# Patient Record
Sex: Female | Born: 1945
Health system: Southern US, Community
[De-identification: ages and names within clinical notes are randomized; demographics above are authoritative.]

## PROBLEM LIST (undated history)

## (undated) DIAGNOSIS — C50919 Malignant neoplasm of unspecified site of unspecified female breast: Secondary | ICD-10-CM

## (undated) DIAGNOSIS — I442 Atrioventricular block, complete: Secondary | ICD-10-CM

## (undated) DIAGNOSIS — Z45018 Encounter for adjustment and management of other part of cardiac pacemaker: Secondary | ICD-10-CM

## (undated) DIAGNOSIS — R51 Headache: Secondary | ICD-10-CM

## (undated) DIAGNOSIS — M1990 Unspecified osteoarthritis, unspecified site: Secondary | ICD-10-CM

## (undated) DIAGNOSIS — R519 Headache, unspecified: Secondary | ICD-10-CM

## (undated) DIAGNOSIS — C519 Malignant neoplasm of vulva, unspecified: Secondary | ICD-10-CM

## (undated) DIAGNOSIS — E559 Vitamin D deficiency, unspecified: Secondary | ICD-10-CM

## (undated) DIAGNOSIS — R001 Bradycardia, unspecified: Secondary | ICD-10-CM

## (undated) DIAGNOSIS — E7800 Pure hypercholesterolemia, unspecified: Secondary | ICD-10-CM

## (undated) DIAGNOSIS — Z8042 Family history of malignant neoplasm of prostate: Secondary | ICD-10-CM

## (undated) DIAGNOSIS — C801 Malignant (primary) neoplasm, unspecified: Secondary | ICD-10-CM

## (undated) DIAGNOSIS — H3530 Unspecified macular degeneration: Secondary | ICD-10-CM

## (undated) DIAGNOSIS — R7611 Nonspecific reaction to tuberculin skin test without active tuberculosis: Secondary | ICD-10-CM

## (undated) HISTORY — DX: Unspecified macular degeneration: H35.30

## (undated) HISTORY — DX: Unspecified osteoarthritis, unspecified site: M19.90

## (undated) HISTORY — PX: ABDOMINAL HYSTERECTOMY: SHX81

## (undated) HISTORY — DX: Malignant neoplasm of unspecified site of unspecified female breast: C50.919

## (undated) HISTORY — DX: Vitamin D deficiency, unspecified: E55.9

## (undated) HISTORY — DX: Nonspecific reaction to tuberculin skin test without active tuberculosis: R76.11

## (undated) HISTORY — PX: HEMORROIDECTOMY: SUR656

## (undated) HISTORY — PX: KNEE SURGERY: SHX244

## (undated) HISTORY — DX: Pure hypercholesterolemia, unspecified: E78.00

## (undated) HISTORY — PX: EYE SURGERY: SHX253

## (undated) HISTORY — DX: Family history of malignant neoplasm of prostate: Z80.42

## (undated) HISTORY — DX: Atrioventricular block, complete: I44.2

---

## 1998-01-05 ENCOUNTER — Ambulatory Visit (HOSPITAL_BASED_OUTPATIENT_CLINIC_OR_DEPARTMENT_OTHER): Admission: RE | Admit: 1998-01-05 | Discharge: 1998-01-05 | Payer: Self-pay | Admitting: Orthopedic Surgery

## 1998-09-20 ENCOUNTER — Ambulatory Visit (HOSPITAL_COMMUNITY): Admission: RE | Admit: 1998-09-20 | Discharge: 1998-09-20 | Payer: Self-pay | Admitting: Internal Medicine

## 1998-12-14 ENCOUNTER — Ambulatory Visit (HOSPITAL_BASED_OUTPATIENT_CLINIC_OR_DEPARTMENT_OTHER): Admission: RE | Admit: 1998-12-14 | Discharge: 1998-12-14 | Payer: Self-pay | Admitting: Orthopedic Surgery

## 1999-05-22 ENCOUNTER — Other Ambulatory Visit: Admission: RE | Admit: 1999-05-22 | Discharge: 1999-05-22 | Payer: Self-pay | Admitting: *Deleted

## 2000-06-12 ENCOUNTER — Other Ambulatory Visit: Admission: RE | Admit: 2000-06-12 | Discharge: 2000-06-12 | Payer: Self-pay | Admitting: *Deleted

## 2001-09-14 ENCOUNTER — Other Ambulatory Visit: Admission: RE | Admit: 2001-09-14 | Discharge: 2001-09-14 | Payer: Self-pay | Admitting: *Deleted

## 2002-03-12 ENCOUNTER — Ambulatory Visit (HOSPITAL_COMMUNITY): Admission: RE | Admit: 2002-03-12 | Discharge: 2002-03-12 | Payer: Self-pay | Admitting: Gastroenterology

## 2002-08-03 ENCOUNTER — Other Ambulatory Visit: Admission: RE | Admit: 2002-08-03 | Discharge: 2002-08-03 | Payer: Self-pay | Admitting: *Deleted

## 2003-03-02 ENCOUNTER — Ambulatory Visit (HOSPITAL_BASED_OUTPATIENT_CLINIC_OR_DEPARTMENT_OTHER): Admission: RE | Admit: 2003-03-02 | Discharge: 2003-03-02 | Payer: Self-pay | Admitting: Orthopedic Surgery

## 2003-08-25 ENCOUNTER — Other Ambulatory Visit: Admission: RE | Admit: 2003-08-25 | Discharge: 2003-08-25 | Payer: Self-pay | Admitting: *Deleted

## 2004-09-20 ENCOUNTER — Other Ambulatory Visit: Admission: RE | Admit: 2004-09-20 | Discharge: 2004-09-20 | Payer: Self-pay | Admitting: *Deleted

## 2004-09-21 ENCOUNTER — Encounter: Admission: RE | Admit: 2004-09-21 | Discharge: 2004-09-21 | Payer: Self-pay | Admitting: Surgery

## 2004-09-23 HISTORY — PX: BREAST SURGERY: SHX581

## 2004-10-09 ENCOUNTER — Encounter: Admission: RE | Admit: 2004-10-09 | Discharge: 2004-10-09 | Payer: Self-pay | Admitting: Surgery

## 2004-10-10 ENCOUNTER — Ambulatory Visit (HOSPITAL_BASED_OUTPATIENT_CLINIC_OR_DEPARTMENT_OTHER): Admission: RE | Admit: 2004-10-10 | Discharge: 2004-10-10 | Payer: Self-pay | Admitting: Surgery

## 2004-10-10 ENCOUNTER — Ambulatory Visit (HOSPITAL_COMMUNITY): Admission: RE | Admit: 2004-10-10 | Discharge: 2004-10-10 | Payer: Self-pay | Admitting: Surgery

## 2004-10-10 ENCOUNTER — Encounter (INDEPENDENT_AMBULATORY_CARE_PROVIDER_SITE_OTHER): Payer: Self-pay | Admitting: *Deleted

## 2004-10-15 ENCOUNTER — Ambulatory Visit: Payer: Self-pay | Admitting: Oncology

## 2004-10-23 ENCOUNTER — Ambulatory Visit: Admission: RE | Admit: 2004-10-23 | Discharge: 2004-11-27 | Payer: Self-pay | Admitting: Radiation Oncology

## 2004-10-25 ENCOUNTER — Encounter (INDEPENDENT_AMBULATORY_CARE_PROVIDER_SITE_OTHER): Payer: Self-pay | Admitting: Specialist

## 2004-10-25 ENCOUNTER — Ambulatory Visit (HOSPITAL_COMMUNITY): Admission: RE | Admit: 2004-10-25 | Discharge: 2004-10-25 | Payer: Self-pay | Admitting: Surgery

## 2004-10-25 ENCOUNTER — Ambulatory Visit (HOSPITAL_BASED_OUTPATIENT_CLINIC_OR_DEPARTMENT_OTHER): Admission: RE | Admit: 2004-10-25 | Discharge: 2004-10-25 | Payer: Self-pay | Admitting: Surgery

## 2004-10-26 ENCOUNTER — Inpatient Hospital Stay (HOSPITAL_COMMUNITY): Admission: AD | Admit: 2004-10-26 | Discharge: 2004-10-27 | Payer: Self-pay | Admitting: Surgery

## 2004-11-01 ENCOUNTER — Ambulatory Visit: Payer: Self-pay

## 2004-11-02 ENCOUNTER — Ambulatory Visit (HOSPITAL_COMMUNITY): Admission: RE | Admit: 2004-11-02 | Discharge: 2004-11-02 | Payer: Self-pay | Admitting: Oncology

## 2004-11-30 ENCOUNTER — Inpatient Hospital Stay (HOSPITAL_COMMUNITY): Admission: EM | Admit: 2004-11-30 | Discharge: 2004-12-03 | Payer: Self-pay | Admitting: Oncology

## 2004-11-30 ENCOUNTER — Ambulatory Visit: Payer: Self-pay | Admitting: Oncology

## 2005-01-24 ENCOUNTER — Ambulatory Visit: Payer: Self-pay | Admitting: Oncology

## 2005-02-08 ENCOUNTER — Ambulatory Visit (HOSPITAL_COMMUNITY): Admission: RE | Admit: 2005-02-08 | Discharge: 2005-02-08 | Payer: Self-pay | Admitting: Oncology

## 2005-02-22 ENCOUNTER — Inpatient Hospital Stay (HOSPITAL_COMMUNITY): Admission: EM | Admit: 2005-02-22 | Discharge: 2005-02-25 | Payer: Self-pay | Admitting: Emergency Medicine

## 2005-02-23 ENCOUNTER — Ambulatory Visit: Payer: Self-pay | Admitting: Oncology

## 2005-03-13 ENCOUNTER — Ambulatory Visit: Admission: RE | Admit: 2005-03-13 | Discharge: 2005-06-11 | Payer: Self-pay | Admitting: Radiation Oncology

## 2005-03-13 ENCOUNTER — Encounter: Admission: RE | Admit: 2005-03-13 | Discharge: 2005-04-12 | Payer: Self-pay | Admitting: Oncology

## 2005-04-01 ENCOUNTER — Ambulatory Visit: Payer: Self-pay | Admitting: Dentistry

## 2005-04-01 ENCOUNTER — Encounter: Admission: RE | Admit: 2005-04-01 | Discharge: 2005-04-01 | Payer: Self-pay | Admitting: Dentistry

## 2005-04-04 ENCOUNTER — Ambulatory Visit: Payer: Self-pay | Admitting: Oncology

## 2005-05-01 ENCOUNTER — Ambulatory Visit: Payer: Self-pay

## 2005-05-22 ENCOUNTER — Ambulatory Visit: Payer: Self-pay | Admitting: Oncology

## 2005-07-29 ENCOUNTER — Ambulatory Visit: Payer: Self-pay | Admitting: Oncology

## 2005-08-02 ENCOUNTER — Ambulatory Visit (HOSPITAL_BASED_OUTPATIENT_CLINIC_OR_DEPARTMENT_OTHER): Admission: RE | Admit: 2005-08-02 | Discharge: 2005-08-02 | Payer: Self-pay | Admitting: Surgery

## 2005-08-20 ENCOUNTER — Encounter: Admission: RE | Admit: 2005-08-20 | Discharge: 2005-09-13 | Payer: Self-pay | Admitting: Oncology

## 2005-11-06 ENCOUNTER — Ambulatory Visit: Payer: Self-pay | Admitting: Oncology

## 2006-01-02 ENCOUNTER — Encounter: Admission: RE | Admit: 2006-01-02 | Discharge: 2006-01-02 | Payer: Self-pay | Admitting: Radiology

## 2006-02-04 ENCOUNTER — Ambulatory Visit: Payer: Self-pay | Admitting: Oncology

## 2006-02-06 LAB — COMPREHENSIVE METABOLIC PANEL
ALT: 14 U/L (ref 0–40)
AST: 15 U/L (ref 0–37)
Albumin: 4.1 g/dL (ref 3.5–5.2)
Alkaline Phosphatase: 92 U/L (ref 39–117)
BUN: 16 mg/dL (ref 6–23)
CO2: 25 mEq/L (ref 19–32)
Calcium: 9.1 mg/dL (ref 8.4–10.5)
Chloride: 104 mEq/L (ref 96–112)
Creatinine, Ser: 0.6 mg/dL (ref 0.4–1.2)
Glucose, Bld: 131 mg/dL — ABNORMAL HIGH (ref 70–99)
Potassium: 3.9 mEq/L (ref 3.5–5.3)
Sodium: 141 mEq/L (ref 135–145)
Total Bilirubin: 0.3 mg/dL (ref 0.3–1.2)
Total Protein: 7 g/dL (ref 6.0–8.3)

## 2006-02-06 LAB — CBC WITH DIFFERENTIAL/PLATELET
BASO%: 0.4 % (ref 0.0–2.0)
Basophils Absolute: 0 10*3/uL (ref 0.0–0.1)
EOS%: 2 % (ref 0.0–7.0)
Eosinophils Absolute: 0.2 10*3/uL (ref 0.0–0.5)
HCT: 34 % — ABNORMAL LOW (ref 34.8–46.6)
HGB: 11.6 g/dL (ref 11.6–15.9)
LYMPH%: 27.4 % (ref 14.0–48.0)
MCH: 29 pg (ref 26.0–34.0)
MCHC: 34 g/dL (ref 32.0–36.0)
MCV: 85.4 fL (ref 81.0–101.0)
MONO#: 0.8 10*3/uL (ref 0.1–0.9)
MONO%: 8.9 % (ref 0.0–13.0)
NEUT#: 5.3 10*3/uL (ref 1.5–6.5)
NEUT%: 61.3 % (ref 39.6–76.8)
Platelets: 266 10*3/uL (ref 145–400)
RBC: 3.98 10*6/uL (ref 3.70–5.32)
RDW: 14.1 % (ref 11.3–14.5)
WBC: 8.6 10*3/uL (ref 3.9–10.0)
lymph#: 2.4 10*3/uL (ref 0.9–3.3)

## 2006-02-06 LAB — CANCER ANTIGEN 27.29: CA 27.29: 15 U/mL (ref 0–39)

## 2006-02-28 ENCOUNTER — Other Ambulatory Visit: Admission: RE | Admit: 2006-02-28 | Discharge: 2006-02-28 | Payer: Self-pay | Admitting: Surgery

## 2006-03-18 ENCOUNTER — Other Ambulatory Visit: Admission: RE | Admit: 2006-03-18 | Discharge: 2006-03-18 | Payer: Self-pay | Admitting: *Deleted

## 2006-05-20 ENCOUNTER — Ambulatory Visit: Payer: Self-pay | Admitting: Oncology

## 2006-05-22 LAB — COMPREHENSIVE METABOLIC PANEL
ALT: 19 U/L (ref 0–40)
AST: 16 U/L (ref 0–37)
Albumin: 3.8 g/dL (ref 3.5–5.2)
Alkaline Phosphatase: 95 U/L (ref 39–117)
BUN: 15 mg/dL (ref 6–23)
CO2: 26 mEq/L (ref 19–32)
Calcium: 9.1 mg/dL (ref 8.4–10.5)
Chloride: 105 mEq/L (ref 96–112)
Creatinine, Ser: 0.65 mg/dL (ref 0.40–1.20)
Glucose, Bld: 177 mg/dL — ABNORMAL HIGH (ref 70–99)
Potassium: 4.2 mEq/L (ref 3.5–5.3)
Sodium: 140 mEq/L (ref 135–145)
Total Bilirubin: 0.3 mg/dL (ref 0.3–1.2)
Total Protein: 6.7 g/dL (ref 6.0–8.3)

## 2006-05-22 LAB — CBC WITH DIFFERENTIAL/PLATELET
BASO%: 0.9 % (ref 0.0–2.0)
Basophils Absolute: 0.1 10*3/uL (ref 0.0–0.1)
EOS%: 1 % (ref 0.0–7.0)
Eosinophils Absolute: 0.1 10*3/uL (ref 0.0–0.5)
HCT: 34.1 % — ABNORMAL LOW (ref 34.8–46.6)
HGB: 11.4 g/dL — ABNORMAL LOW (ref 11.6–15.9)
LYMPH%: 23.5 % (ref 14.0–48.0)
MCH: 29 pg (ref 26.0–34.0)
MCHC: 33.6 g/dL (ref 32.0–36.0)
MCV: 86.3 fL (ref 81.0–101.0)
MONO#: 0.7 10*3/uL (ref 0.1–0.9)
MONO%: 7.1 % (ref 0.0–13.0)
NEUT#: 6.6 10*3/uL — ABNORMAL HIGH (ref 1.5–6.5)
NEUT%: 67.5 % (ref 39.6–76.8)
Platelets: 277 10*3/uL (ref 145–400)
RBC: 3.95 10*6/uL (ref 3.70–5.32)
RDW: 13.9 % (ref 11.3–14.5)
WBC: 9.8 10*3/uL (ref 3.9–10.0)
lymph#: 2.3 10*3/uL (ref 0.9–3.3)

## 2006-05-22 LAB — CANCER ANTIGEN 27.29: CA 27.29: 10 U/mL (ref 0–39)

## 2006-05-30 ENCOUNTER — Encounter: Admission: RE | Admit: 2006-05-30 | Discharge: 2006-05-30 | Payer: Self-pay | Admitting: Family Medicine

## 2006-09-08 ENCOUNTER — Ambulatory Visit: Payer: Self-pay | Admitting: Oncology

## 2006-09-10 LAB — COMPREHENSIVE METABOLIC PANEL
ALT: 16 U/L (ref 0–35)
AST: 13 U/L (ref 0–37)
Albumin: 4.2 g/dL (ref 3.5–5.2)
Alkaline Phosphatase: 104 U/L (ref 39–117)
BUN: 20 mg/dL (ref 6–23)
CO2: 23 mEq/L (ref 19–32)
Calcium: 9.1 mg/dL (ref 8.4–10.5)
Chloride: 103 mEq/L (ref 96–112)
Creatinine, Ser: 0.69 mg/dL (ref 0.40–1.20)
Glucose, Bld: 131 mg/dL — ABNORMAL HIGH (ref 70–99)
Potassium: 4 mEq/L (ref 3.5–5.3)
Sodium: 139 mEq/L (ref 135–145)
Total Bilirubin: 0.2 mg/dL — ABNORMAL LOW (ref 0.3–1.2)
Total Protein: 7.2 g/dL (ref 6.0–8.3)

## 2006-09-10 LAB — CBC WITH DIFFERENTIAL/PLATELET
BASO%: 0.4 % (ref 0.0–2.0)
Basophils Absolute: 0 10*3/uL (ref 0.0–0.1)
EOS%: 1 % (ref 0.0–7.0)
Eosinophils Absolute: 0.1 10*3/uL (ref 0.0–0.5)
HCT: 37.2 % (ref 34.8–46.6)
HGB: 12.4 g/dL (ref 11.6–15.9)
LYMPH%: 29.3 % (ref 14.0–48.0)
MCH: 28.6 pg (ref 26.0–34.0)
MCHC: 33.4 g/dL (ref 32.0–36.0)
MCV: 85.6 fL (ref 81.0–101.0)
MONO#: 0.8 10*3/uL (ref 0.1–0.9)
MONO%: 7.8 % (ref 0.0–13.0)
NEUT#: 5.9 10*3/uL (ref 1.5–6.5)
NEUT%: 61.5 % (ref 39.6–76.8)
Platelets: 328 10*3/uL (ref 145–400)
RBC: 4.35 10*6/uL (ref 3.70–5.32)
RDW: 13.3 % (ref 11.3–14.5)
WBC: 9.7 10*3/uL (ref 3.9–10.0)
lymph#: 2.8 10*3/uL (ref 0.9–3.3)

## 2006-09-10 LAB — CANCER ANTIGEN 27.29: CA 27.29: 10 U/mL (ref 0–39)

## 2007-01-02 ENCOUNTER — Ambulatory Visit: Payer: Self-pay | Admitting: Oncology

## 2007-01-07 LAB — LACTATE DEHYDROGENASE: LDH: 155 U/L (ref 94–250)

## 2007-01-07 LAB — CBC WITH DIFFERENTIAL/PLATELET
BASO%: 0.3 % (ref 0.0–2.0)
Basophils Absolute: 0 10*3/uL (ref 0.0–0.1)
EOS%: 1.5 % (ref 0.0–7.0)
Eosinophils Absolute: 0.1 10*3/uL (ref 0.0–0.5)
HCT: 32.2 % — ABNORMAL LOW (ref 34.8–46.6)
HGB: 10.9 g/dL — ABNORMAL LOW (ref 11.6–15.9)
LYMPH%: 27.5 % (ref 14.0–48.0)
MCH: 28.7 pg (ref 26.0–34.0)
MCHC: 33.9 g/dL (ref 32.0–36.0)
MCV: 84.6 fL (ref 81.0–101.0)
MONO#: 0.7 10*3/uL (ref 0.1–0.9)
MONO%: 7.9 % (ref 0.0–13.0)
NEUT#: 5.3 10*3/uL (ref 1.5–6.5)
NEUT%: 62.8 % (ref 39.6–76.8)
Platelets: 270 10*3/uL (ref 145–400)
RBC: 3.81 10*6/uL (ref 3.70–5.32)
RDW: 13.4 % (ref 11.3–14.5)
WBC: 8.4 10*3/uL (ref 3.9–10.0)
lymph#: 2.3 10*3/uL (ref 0.9–3.3)

## 2007-01-07 LAB — COMPREHENSIVE METABOLIC PANEL
ALT: 14 U/L (ref 0–35)
AST: 12 U/L (ref 0–37)
Albumin: 3.6 g/dL (ref 3.5–5.2)
Alkaline Phosphatase: 90 U/L (ref 39–117)
BUN: 15 mg/dL (ref 6–23)
CO2: 24 mEq/L (ref 19–32)
Calcium: 8.6 mg/dL (ref 8.4–10.5)
Chloride: 105 mEq/L (ref 96–112)
Creatinine, Ser: 0.53 mg/dL (ref 0.40–1.20)
Glucose, Bld: 104 mg/dL — ABNORMAL HIGH (ref 70–99)
Potassium: 4.2 mEq/L (ref 3.5–5.3)
Sodium: 142 mEq/L (ref 135–145)
Total Bilirubin: 0.2 mg/dL — ABNORMAL LOW (ref 0.3–1.2)
Total Protein: 6 g/dL (ref 6.0–8.3)

## 2007-01-07 LAB — CANCER ANTIGEN 27.29: CA 27.29: 10 U/mL (ref 0–39)

## 2007-05-13 ENCOUNTER — Ambulatory Visit: Payer: Self-pay | Admitting: Oncology

## 2007-05-15 LAB — COMPREHENSIVE METABOLIC PANEL
ALT: 16 U/L (ref 0–35)
AST: 12 U/L (ref 0–37)
Albumin: 3.9 g/dL (ref 3.5–5.2)
Alkaline Phosphatase: 99 U/L (ref 39–117)
BUN: 15 mg/dL (ref 6–23)
CO2: 24 mEq/L (ref 19–32)
Calcium: 9 mg/dL (ref 8.4–10.5)
Chloride: 103 mEq/L (ref 96–112)
Creatinine, Ser: 0.67 mg/dL (ref 0.40–1.20)
Glucose, Bld: 216 mg/dL — ABNORMAL HIGH (ref 70–99)
Potassium: 3.8 mEq/L (ref 3.5–5.3)
Sodium: 140 mEq/L (ref 135–145)
Total Bilirubin: 0.3 mg/dL (ref 0.3–1.2)
Total Protein: 6.4 g/dL (ref 6.0–8.3)

## 2007-05-15 LAB — CBC WITH DIFFERENTIAL/PLATELET
BASO%: 0.2 % (ref 0.0–2.0)
Basophils Absolute: 0 10*3/uL (ref 0.0–0.1)
EOS%: 0.6 % (ref 0.0–7.0)
Eosinophils Absolute: 0.1 10*3/uL (ref 0.0–0.5)
HCT: 34.4 % — ABNORMAL LOW (ref 34.8–46.6)
HGB: 11.9 g/dL (ref 11.6–15.9)
LYMPH%: 24.1 % (ref 14.0–48.0)
MCH: 29.6 pg (ref 26.0–34.0)
MCHC: 34.8 g/dL (ref 32.0–36.0)
MCV: 85.1 fL (ref 81.0–101.0)
MONO#: 0.6 10*3/uL (ref 0.1–0.9)
MONO%: 6.1 % (ref 0.0–13.0)
NEUT#: 6.8 10*3/uL — ABNORMAL HIGH (ref 1.5–6.5)
NEUT%: 69 % (ref 39.6–76.8)
Platelets: 258 10*3/uL (ref 145–400)
RBC: 4.04 10*6/uL (ref 3.70–5.32)
RDW: 14.5 % (ref 11.3–14.5)
WBC: 9.8 10*3/uL (ref 3.9–10.0)
lymph#: 2.4 10*3/uL (ref 0.9–3.3)

## 2007-05-15 LAB — CANCER ANTIGEN 27.29: CA 27.29: 10 U/mL (ref 0–39)

## 2007-05-27 ENCOUNTER — Ambulatory Visit (HOSPITAL_COMMUNITY): Admission: RE | Admit: 2007-05-27 | Discharge: 2007-05-27 | Payer: Self-pay | Admitting: Oncology

## 2007-09-10 ENCOUNTER — Ambulatory Visit: Payer: Self-pay | Admitting: Oncology

## 2007-09-14 LAB — CBC WITH DIFFERENTIAL/PLATELET
BASO%: 0.6 % (ref 0.0–2.0)
Basophils Absolute: 0 10*3/uL (ref 0.0–0.1)
EOS%: 1.9 % (ref 0.0–7.0)
Eosinophils Absolute: 0.2 10*3/uL (ref 0.0–0.5)
HCT: 38 % (ref 34.8–46.6)
HGB: 13 g/dL (ref 11.6–15.9)
LYMPH%: 27.3 % (ref 14.0–48.0)
MCH: 28.7 pg (ref 26.0–34.0)
MCHC: 34.1 g/dL (ref 32.0–36.0)
MCV: 84 fL (ref 81.0–101.0)
MONO#: 0.5 10*3/uL (ref 0.1–0.9)
MONO%: 6.5 % (ref 0.0–13.0)
NEUT#: 5.1 10*3/uL (ref 1.5–6.5)
NEUT%: 63.7 % (ref 39.6–76.8)
Platelets: 281 10*3/uL (ref 145–400)
RBC: 4.53 10*6/uL (ref 3.70–5.32)
RDW: 13.4 % (ref 11.3–14.5)
WBC: 8.1 10*3/uL (ref 3.9–10.0)
lymph#: 2.2 10*3/uL (ref 0.9–3.3)

## 2007-09-14 LAB — COMPREHENSIVE METABOLIC PANEL
ALT: 16 U/L (ref 0–35)
AST: 14 U/L (ref 0–37)
Albumin: 4.2 g/dL (ref 3.5–5.2)
Alkaline Phosphatase: 106 U/L (ref 39–117)
BUN: 10 mg/dL (ref 6–23)
CO2: 24 mEq/L (ref 19–32)
Calcium: 9.4 mg/dL (ref 8.4–10.5)
Chloride: 101 mEq/L (ref 96–112)
Creatinine, Ser: 0.65 mg/dL (ref 0.40–1.20)
Glucose, Bld: 276 mg/dL — ABNORMAL HIGH (ref 70–99)
Potassium: 3.9 mEq/L (ref 3.5–5.3)
Sodium: 138 mEq/L (ref 135–145)
Total Bilirubin: 0.5 mg/dL (ref 0.3–1.2)
Total Protein: 7 g/dL (ref 6.0–8.3)

## 2007-09-14 LAB — LACTATE DEHYDROGENASE: LDH: 171 U/L (ref 94–250)

## 2007-09-14 LAB — CANCER ANTIGEN 27.29: CA 27.29: 11 U/mL (ref 0–39)

## 2007-11-24 ENCOUNTER — Encounter: Admission: RE | Admit: 2007-11-24 | Discharge: 2007-11-24 | Payer: Self-pay | Admitting: Family Medicine

## 2008-01-11 ENCOUNTER — Ambulatory Visit: Payer: Self-pay | Admitting: Oncology

## 2008-01-13 LAB — CBC WITH DIFFERENTIAL/PLATELET
BASO%: 0 % (ref 0.0–2.0)
Basophils Absolute: 0 10*3/uL (ref 0.0–0.1)
EOS%: 1.9 % (ref 0.0–7.0)
Eosinophils Absolute: 0.2 10*3/uL (ref 0.0–0.5)
HCT: 36.1 % (ref 34.8–46.6)
HGB: 12.4 g/dL (ref 11.6–15.9)
LYMPH%: 28.4 % (ref 14.0–48.0)
MCH: 28.8 pg (ref 26.0–34.0)
MCHC: 34.3 g/dL (ref 32.0–36.0)
MCV: 83.9 fL (ref 81.0–101.0)
MONO#: 0.6 10*3/uL (ref 0.1–0.9)
MONO%: 6.2 % (ref 0.0–13.0)
NEUT#: 5.9 10*3/uL (ref 1.5–6.5)
NEUT%: 63.5 % (ref 39.6–76.8)
Platelets: 277 10*3/uL (ref 145–400)
RBC: 4.3 10*6/uL (ref 3.70–5.32)
RDW: 13.9 % (ref 11.3–14.5)
WBC: 9.4 10*3/uL (ref 3.9–10.0)
lymph#: 2.7 10*3/uL (ref 0.9–3.3)

## 2008-01-13 LAB — COMPREHENSIVE METABOLIC PANEL
ALT: 13 U/L (ref 0–35)
AST: 13 U/L (ref 0–37)
Albumin: 4.1 g/dL (ref 3.5–5.2)
Alkaline Phosphatase: 95 U/L (ref 39–117)
BUN: 15 mg/dL (ref 6–23)
CO2: 24 mEq/L (ref 19–32)
Calcium: 9.4 mg/dL (ref 8.4–10.5)
Chloride: 103 mEq/L (ref 96–112)
Creatinine, Ser: 0.7 mg/dL (ref 0.40–1.20)
Glucose, Bld: 192 mg/dL — ABNORMAL HIGH (ref 70–99)
Potassium: 4 mEq/L (ref 3.5–5.3)
Sodium: 139 mEq/L (ref 135–145)
Total Bilirubin: 0.3 mg/dL (ref 0.3–1.2)
Total Protein: 7.2 g/dL (ref 6.0–8.3)

## 2008-01-13 LAB — CANCER ANTIGEN 27.29: CA 27.29: 14 U/mL (ref 0–39)

## 2008-01-13 LAB — LACTATE DEHYDROGENASE: LDH: 153 U/L (ref 94–250)

## 2008-03-18 ENCOUNTER — Other Ambulatory Visit: Admission: RE | Admit: 2008-03-18 | Discharge: 2008-03-18 | Payer: Self-pay | Admitting: Gynecology

## 2008-05-05 ENCOUNTER — Ambulatory Visit: Payer: Self-pay | Admitting: Oncology

## 2008-05-09 LAB — CBC WITH DIFFERENTIAL/PLATELET
BASO%: 0.4 % (ref 0.0–2.0)
Basophils Absolute: 0 10*3/uL (ref 0.0–0.1)
EOS%: 1.4 % (ref 0.0–7.0)
Eosinophils Absolute: 0.1 10*3/uL (ref 0.0–0.5)
HCT: 34.4 % — ABNORMAL LOW (ref 34.8–46.6)
HGB: 11.5 g/dL — ABNORMAL LOW (ref 11.6–15.9)
LYMPH%: 29.5 % (ref 14.0–48.0)
MCH: 28.5 pg (ref 26.0–34.0)
MCHC: 33.5 g/dL (ref 32.0–36.0)
MCV: 85.1 fL (ref 81.0–101.0)
MONO#: 0.6 10*3/uL (ref 0.1–0.9)
MONO%: 6.9 % (ref 0.0–13.0)
NEUT#: 5.8 10*3/uL (ref 1.5–6.5)
NEUT%: 61.8 % (ref 39.6–76.8)
Platelets: 217 10*3/uL (ref 145–400)
RBC: 4.04 10*6/uL (ref 3.70–5.32)
RDW: 13 % (ref 11.3–14.5)
WBC: 9.3 10*3/uL (ref 3.9–10.0)
lymph#: 2.8 10*3/uL (ref 0.9–3.3)

## 2008-05-09 LAB — CANCER ANTIGEN 27.29: CA 27.29: 16 U/mL (ref 0–39)

## 2008-05-09 LAB — COMPREHENSIVE METABOLIC PANEL
ALT: 13 U/L (ref 0–35)
AST: 14 U/L (ref 0–37)
Albumin: 4 g/dL (ref 3.5–5.2)
Alkaline Phosphatase: 75 U/L (ref 39–117)
BUN: 20 mg/dL (ref 6–23)
CO2: 25 mEq/L (ref 19–32)
Calcium: 9.5 mg/dL (ref 8.4–10.5)
Chloride: 104 mEq/L (ref 96–112)
Creatinine, Ser: 0.59 mg/dL (ref 0.40–1.20)
Glucose, Bld: 122 mg/dL — ABNORMAL HIGH (ref 70–99)
Potassium: 3.8 mEq/L (ref 3.5–5.3)
Sodium: 142 mEq/L (ref 135–145)
Total Bilirubin: 0.3 mg/dL (ref 0.3–1.2)
Total Protein: 7 g/dL (ref 6.0–8.3)

## 2008-05-09 LAB — LACTATE DEHYDROGENASE: LDH: 171 U/L (ref 94–250)

## 2008-08-25 ENCOUNTER — Ambulatory Visit: Payer: Self-pay | Admitting: Oncology

## 2008-11-21 ENCOUNTER — Encounter: Payer: Self-pay | Admitting: Internal Medicine

## 2008-11-28 ENCOUNTER — Ambulatory Visit: Payer: Self-pay | Admitting: Internal Medicine

## 2008-11-28 DIAGNOSIS — R059 Cough, unspecified: Secondary | ICD-10-CM | POA: Insufficient documentation

## 2008-11-28 DIAGNOSIS — R05 Cough: Secondary | ICD-10-CM

## 2008-11-28 DIAGNOSIS — E119 Type 2 diabetes mellitus without complications: Secondary | ICD-10-CM | POA: Insufficient documentation

## 2008-11-28 DIAGNOSIS — Z853 Personal history of malignant neoplasm of breast: Secondary | ICD-10-CM | POA: Insufficient documentation

## 2008-12-26 ENCOUNTER — Ambulatory Visit: Payer: Self-pay | Admitting: Oncology

## 2008-12-28 LAB — CBC WITH DIFFERENTIAL/PLATELET
BASO%: 0.4 % (ref 0.0–2.0)
Basophils Absolute: 0 10*3/uL (ref 0.0–0.1)
EOS%: 1.7 % (ref 0.0–7.0)
Eosinophils Absolute: 0.2 10*3/uL (ref 0.0–0.5)
HCT: 34.2 % — ABNORMAL LOW (ref 34.8–46.6)
HGB: 11.3 g/dL — ABNORMAL LOW (ref 11.6–15.9)
LYMPH%: 21.3 % (ref 14.0–49.7)
MCH: 28.8 pg (ref 25.1–34.0)
MCHC: 33 g/dL (ref 31.5–36.0)
MCV: 87 fL (ref 79.5–101.0)
MONO#: 0.6 10*3/uL (ref 0.1–0.9)
MONO%: 5.8 % (ref 0.0–14.0)
NEUT#: 7.1 10*3/uL — ABNORMAL HIGH (ref 1.5–6.5)
NEUT%: 70.8 % (ref 38.4–76.8)
Platelets: 241 10*3/uL (ref 145–400)
RBC: 3.93 10*6/uL (ref 3.70–5.45)
RDW: 13.2 % (ref 11.2–14.5)
WBC: 10 10*3/uL (ref 3.9–10.3)
lymph#: 2.1 10*3/uL (ref 0.9–3.3)

## 2008-12-28 LAB — COMPREHENSIVE METABOLIC PANEL
ALT: 17 U/L (ref 0–35)
AST: 18 U/L (ref 0–37)
Albumin: 3.3 g/dL — ABNORMAL LOW (ref 3.5–5.2)
Alkaline Phosphatase: 74 U/L (ref 39–117)
BUN: 13 mg/dL (ref 6–23)
CO2: 27 mEq/L (ref 19–32)
Calcium: 9.1 mg/dL (ref 8.4–10.5)
Chloride: 107 mEq/L (ref 96–112)
Creatinine, Ser: 0.56 mg/dL (ref 0.40–1.20)
Glucose, Bld: 158 mg/dL — ABNORMAL HIGH (ref 70–99)
Potassium: 3.8 mEq/L (ref 3.5–5.3)
Sodium: 142 mEq/L (ref 135–145)
Total Bilirubin: 0.4 mg/dL (ref 0.3–1.2)
Total Protein: 6.8 g/dL (ref 6.0–8.3)

## 2008-12-28 LAB — CANCER ANTIGEN 27.29: CA 27.29: 12 U/mL (ref 0–39)

## 2008-12-28 LAB — LACTATE DEHYDROGENASE: LDH: 150 U/L (ref 94–250)

## 2009-01-06 ENCOUNTER — Ambulatory Visit (HOSPITAL_COMMUNITY): Admission: RE | Admit: 2009-01-06 | Discharge: 2009-01-06 | Payer: Self-pay | Admitting: Oncology

## 2009-01-23 ENCOUNTER — Ambulatory Visit: Payer: Self-pay | Admitting: Internal Medicine

## 2009-01-25 ENCOUNTER — Ambulatory Visit: Payer: Self-pay | Admitting: Cardiovascular Disease

## 2009-05-02 ENCOUNTER — Ambulatory Visit: Payer: Self-pay | Admitting: Oncology

## 2009-05-08 LAB — COMPREHENSIVE METABOLIC PANEL
ALT: 15 U/L (ref 0–35)
AST: 20 U/L (ref 0–37)
Albumin: 3.6 g/dL (ref 3.5–5.2)
Alkaline Phosphatase: 63 U/L (ref 39–117)
BUN: 13 mg/dL (ref 6–23)
CO2: 24 mEq/L (ref 19–32)
Calcium: 9 mg/dL (ref 8.4–10.5)
Chloride: 106 mEq/L (ref 96–112)
Creatinine, Ser: 0.65 mg/dL (ref 0.40–1.20)
Glucose, Bld: 218 mg/dL — ABNORMAL HIGH (ref 70–99)
Potassium: 3.7 mEq/L (ref 3.5–5.3)
Sodium: 138 mEq/L (ref 135–145)
Total Bilirubin: 0.4 mg/dL (ref 0.3–1.2)
Total Protein: 7.1 g/dL (ref 6.0–8.3)

## 2009-05-08 LAB — CBC WITH DIFFERENTIAL/PLATELET
BASO%: 0.2 % (ref 0.0–2.0)
Basophils Absolute: 0 10*3/uL (ref 0.0–0.1)
EOS%: 2.2 % (ref 0.0–7.0)
Eosinophils Absolute: 0.2 10*3/uL (ref 0.0–0.5)
HCT: 34.4 % — ABNORMAL LOW (ref 34.8–46.6)
HGB: 11.4 g/dL — ABNORMAL LOW (ref 11.6–15.9)
LYMPH%: 28.8 % (ref 14.0–49.7)
MCH: 28.3 pg (ref 25.1–34.0)
MCHC: 33.1 g/dL (ref 31.5–36.0)
MCV: 85.4 fL (ref 79.5–101.0)
MONO#: 0.5 10*3/uL (ref 0.1–0.9)
MONO%: 5.7 % (ref 0.0–14.0)
NEUT#: 5.9 10*3/uL (ref 1.5–6.5)
NEUT%: 63.1 % (ref 38.4–76.8)
Platelets: 231 10*3/uL (ref 145–400)
RBC: 4.03 10*6/uL (ref 3.70–5.45)
RDW: 14.3 % (ref 11.2–14.5)
WBC: 9.4 10*3/uL (ref 3.9–10.3)
lymph#: 2.7 10*3/uL (ref 0.9–3.3)
nRBC: 0 % (ref 0–0)

## 2009-05-08 LAB — LACTATE DEHYDROGENASE: LDH: 149 U/L (ref 94–250)

## 2009-05-09 LAB — CANCER ANTIGEN 27.29: CA 27.29: 11 U/mL (ref 0–39)

## 2009-05-09 LAB — VITAMIN D 25 HYDROXY (VIT D DEFICIENCY, FRACTURES): Vit D, 25-Hydroxy: 34 ng/mL (ref 30–89)

## 2009-10-04 ENCOUNTER — Ambulatory Visit: Payer: Self-pay | Admitting: Oncology

## 2009-10-06 LAB — CBC WITH DIFFERENTIAL/PLATELET
BASO%: 0.2 % (ref 0.0–2.0)
Basophils Absolute: 0 10*3/uL (ref 0.0–0.1)
EOS%: 1.6 % (ref 0.0–7.0)
Eosinophils Absolute: 0.2 10*3/uL (ref 0.0–0.5)
HCT: 39 % (ref 34.8–46.6)
HGB: 12.6 g/dL (ref 11.6–15.9)
LYMPH%: 31.8 % (ref 14.0–49.7)
MCH: 28.3 pg (ref 25.1–34.0)
MCHC: 32.3 g/dL (ref 31.5–36.0)
MCV: 87.4 fL (ref 79.5–101.0)
MONO#: 0.7 10*3/uL (ref 0.1–0.9)
MONO%: 6.3 % (ref 0.0–14.0)
NEUT#: 6.6 10*3/uL — ABNORMAL HIGH (ref 1.5–6.5)
NEUT%: 60.1 % (ref 38.4–76.8)
Platelets: 255 10*3/uL (ref 145–400)
RBC: 4.46 10*6/uL (ref 3.70–5.45)
RDW: 14.1 % (ref 11.2–14.5)
WBC: 10.9 10*3/uL — ABNORMAL HIGH (ref 3.9–10.3)
lymph#: 3.5 10*3/uL — ABNORMAL HIGH (ref 0.9–3.3)

## 2009-10-06 LAB — CANCER ANTIGEN 27.29: CA 27.29: 13 U/mL (ref 0–39)

## 2009-10-06 LAB — COMPREHENSIVE METABOLIC PANEL
ALT: 17 U/L (ref 0–35)
AST: 16 U/L (ref 0–37)
Albumin: 3.7 g/dL (ref 3.5–5.2)
Alkaline Phosphatase: 76 U/L (ref 39–117)
BUN: 15 mg/dL (ref 6–23)
CO2: 29 mEq/L (ref 19–32)
Calcium: 9.5 mg/dL (ref 8.4–10.5)
Chloride: 102 mEq/L (ref 96–112)
Creatinine, Ser: 0.82 mg/dL (ref 0.40–1.20)
Glucose, Bld: 146 mg/dL — ABNORMAL HIGH (ref 70–99)
Potassium: 4 mEq/L (ref 3.5–5.3)
Sodium: 138 mEq/L (ref 135–145)
Total Bilirubin: 0.5 mg/dL (ref 0.3–1.2)
Total Protein: 7.4 g/dL (ref 6.0–8.3)

## 2009-10-06 LAB — LACTATE DEHYDROGENASE: LDH: 137 U/L (ref 94–250)

## 2010-05-10 ENCOUNTER — Encounter: Admission: RE | Admit: 2010-05-10 | Discharge: 2010-08-08 | Payer: Self-pay | Admitting: Endocrinology

## 2010-07-17 ENCOUNTER — Emergency Department (HOSPITAL_COMMUNITY): Admission: EM | Admit: 2010-07-17 | Discharge: 2010-07-17 | Payer: Self-pay | Admitting: Emergency Medicine

## 2010-09-13 ENCOUNTER — Ambulatory Visit: Payer: Self-pay | Admitting: Internal Medicine

## 2010-09-14 ENCOUNTER — Encounter: Payer: Self-pay | Admitting: Internal Medicine

## 2010-09-26 ENCOUNTER — Telehealth: Payer: Self-pay | Admitting: Internal Medicine

## 2010-09-28 ENCOUNTER — Ambulatory Visit
Admission: RE | Admit: 2010-09-28 | Discharge: 2010-09-28 | Payer: Self-pay | Source: Home / Self Care | Attending: Internal Medicine | Admitting: Internal Medicine

## 2010-10-02 ENCOUNTER — Ambulatory Visit: Payer: Self-pay | Admitting: Oncology

## 2010-10-08 LAB — CBC WITH DIFFERENTIAL/PLATELET
BASO%: 0.3 % (ref 0.0–2.0)
Basophils Absolute: 0 10*3/uL (ref 0.0–0.1)
EOS%: 2.2 % (ref 0.0–7.0)
Eosinophils Absolute: 0.2 10*3/uL (ref 0.0–0.5)
HCT: 36.1 % (ref 34.8–46.6)
HGB: 12.1 g/dL (ref 11.6–15.9)
LYMPH%: 21.8 % (ref 14.0–49.7)
MCH: 28.6 pg (ref 25.1–34.0)
MCHC: 33.6 g/dL (ref 31.5–36.0)
MCV: 85.3 fL (ref 79.5–101.0)
MONO#: 0.8 10*3/uL (ref 0.1–0.9)
MONO%: 9.3 % (ref 0.0–14.0)
NEUT#: 5.5 10*3/uL (ref 1.5–6.5)
NEUT%: 66.4 % (ref 38.4–76.8)
Platelets: 249 10*3/uL (ref 145–400)
RBC: 4.24 10*6/uL (ref 3.70–5.45)
RDW: 14.2 % (ref 11.2–14.5)
WBC: 8.3 10*3/uL (ref 3.9–10.3)
lymph#: 1.8 10*3/uL (ref 0.9–3.3)

## 2010-10-09 LAB — COMPREHENSIVE METABOLIC PANEL
ALT: 13 U/L (ref 0–35)
AST: 16 U/L (ref 0–37)
Albumin: 4.2 g/dL (ref 3.5–5.2)
Alkaline Phosphatase: 71 U/L (ref 39–117)
BUN: 15 mg/dL (ref 6–23)
CO2: 25 mEq/L (ref 19–32)
Calcium: 9.5 mg/dL (ref 8.4–10.5)
Chloride: 102 mEq/L (ref 96–112)
Creatinine, Ser: 0.89 mg/dL (ref 0.40–1.20)
Glucose, Bld: 224 mg/dL — ABNORMAL HIGH (ref 70–99)
Potassium: 4 mEq/L (ref 3.5–5.3)
Sodium: 138 mEq/L (ref 135–145)
Total Bilirubin: 0.3 mg/dL (ref 0.3–1.2)
Total Protein: 6.9 g/dL (ref 6.0–8.3)

## 2010-10-09 LAB — CANCER ANTIGEN 27.29: CA 27.29: 14 U/mL (ref 0–39)

## 2010-10-09 LAB — VITAMIN D 25 HYDROXY (VIT D DEFICIENCY, FRACTURES): Vit D, 25-Hydroxy: 21 ng/mL — ABNORMAL LOW (ref 30–89)

## 2010-10-09 LAB — LACTATE DEHYDROGENASE: LDH: 157 U/L (ref 94–250)

## 2010-10-13 ENCOUNTER — Encounter: Payer: Self-pay | Admitting: Surgery

## 2010-10-23 NOTE — Assessment & Plan Note (Signed)
Summary: Pulmonary/ chronic cough   Visit Type:  Initial Consult Referred by:  Dr. Shaune Pollack PCP:  Dr. Shaune Pollack  Chief Complaint:  Cough.  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  November 28, 2008 pulmonary ov for new cough since 2007,  dry in nature,  comes in spells without a pattern except  not typically waking up from sleep, not exacerbating early am assoc with bladder incontinence,  no real pattern in terms of exposure. was better last summer with no rx for weeks at a time and nothing obvious triggering it in her environment.  coughs to hard she looses her voice, breathing but no gagging or vomit.  Pt denies any significant sore throat, nasal congestion or excess secretions, fever, chills, sweats, unintended wt loss, pleuritic or exertional cp, orthopnea pnd or leg swelling.  Pt also denies any obvious fluctuation in symptoms with weather or environmental change or other alleviating or aggravating factors.     Has tessilon but hasn't tried it.    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  The patient denies shortness of breath at rest, productive cough, coughing up blood, chest pain, irregular heartbeats, acid heartburn, indigestion, loss of appetite, weight change, abdominal pain, difficulty swallowing, sore throat, tooth/dental problems, nasal congestion/difficulty breathing through nose, sneezing, itching, ear ache, anxiety, depression, hand/feet swelling, rash, change in color of mucus, and fever.    Vital Signs:  Patient Profile:   65 Years Old Female Height:     60 inches Weight:      252 pounds O2 Sat:      97 % O2 treatment:    Room Air Temp:     98.2 degrees F oral Pulse rate:   78 / minute BP sitting:   112 / 64  (right arm) Cuff size:   large  Vitals Entered By: Vernie Murders (November 28, 2008 2:50 PM)                Physical Exam  wt 252 November 28, 2008  Ambulatory healthy obese bf  in no acute distress. Afeb with normal vital signs HEENT: nl dentition, turbinates, and orophanx. Nl external ear canals without cough reflex Neck without JVD/Nodes/TM Lungs clear to A and P  bilaterally without cough on insp or exp maneuvers RRR no s3 or murmur or increase in P2 Abd soft and benign with nl excursion in the supine position. No bruits or organomegaly Ext warm without calf tenderness, cyanosis clubbing or edema Skin warm and dry without lesions     Pulmonary Function Test Height (in.): 60 Gender: Female  Impression & Recommendations:  Problem # 1:  COUGH (ICD-786.2)  Orders: Consultation Level V (16606)   Medications Added to Medication List This Visit: 1)  Vicodin 5-500 Mg Tabs (Hydrocodone-acetaminophen) .Marland Kitchen.. 1 every 6-8 hours as needed 2)  Claritin 10 Mg Tabs (Loratadine) .Marland Kitchen.. 1 once daily as needed 3)  Duetact 30-2 Mg Tabs (Pioglitazone hcl-glimepiride)  .Marland Kitchen.. 1 once daily 4)  Vitamin D 30160 Unit Caps (Ergocalciferol) .Marland Kitchen.. 1 cap by mouth twice wkly 5)  Fish Oil 1000 Mg Caps (Omega-3 fatty acids) .Marland Kitchen.. 1 once daily 6)  Calcium 500/d 500-200 Mg-unit Tabs (Calcium carbonate-vitamin d) .Marland Kitchen.. 1 once daily  Patient Instructions: 1)  See Reflux instructions 2)  Prevacid 30 mg  Take  one 30-60 min before first meal of the day  3)  GERD (gastroesophageal reflux disease) was discussed. It is a common cause of respiratory symptoms. It commonly presents in the absence of heartburn. GERD can be treated with medication, but also with lifestyle changes including avoidance of late meals, excessive alcohol, smoking cessation, and avoid fatty foods, chocolate, peppermint, colas, red wine, and acidic juices such as orange juice. NO MINT OR MENTHOL PRODUCTS(no cough drops) 4)  USE SUGARLESS CANDY INSTEAD (jolley ranchers)  5)  No oil based vitamins 6)  Please schedule a follow-up appointment in 6 weeks, sooner if needed   Appended Document: Pulmonary/ chronic cough cxr 11/30/07 ok  DDX is cough variant asthma, upper airway cough syndrome (previously termed post nasal drip syndrome) or GERD, although many studies of chronic cough show pts have more than one mechanism playing a role, and reflux secondary to the cough is always a concern and needs to be treated empirically until the cough is gone because of the potential for a cyclical pattern to emerge, regardless of whether it's the primary problem or not. Of the three most common causes of chronic cough, only one can actually cause the other two and perpetuate the cylce of cough inducing airway trauma, inflammation, heightened sensitivity to reflux which is prompted by the cough itself via a cyclical mechanism.  This may partially respond to steroids and look like asthma and post nasal drainage but never erradicated completely unless the cough and the secondary reflux are eliminated, preferably both at the same time.    Therefore rec diet, Prevacid and tessilon for up to a month before ov with cxr if not better  I emphasized to her and her husband that t he standardized cough guidelines recently published in Chest are a14 step process, not a single office visit, to address this problem and the alogrithm is almost  entirely related at each progressive step  to determining response to therapy with minimal addtional testing needed. Therefore if compliance is an issue this empiric standardized approach simply won't work

## 2010-10-23 NOTE — Assessment & Plan Note (Signed)
Summary: Pulmonary/ fu cough eval   Copy to:  Dr. Shaune Pollack Primary Provider/Referring Provider:  Dr. Shaune Pollack  CC:  6 wk followup.  Pt c/o prod cough in the am x 3 wks with bloody sputum.  States that cough is worse in the am- feels like she has something stuck in her throat.  Marland Kitchen  History of Present Illness: 65 yobf never smoker with Positive PPD in her 86's.  November 28, 2008 pulmonary ov for new cough since 2007,  dry in nature,  comes in spells without a pattern except  not typically waking up from sleep, not exacerbating early am assoc with bladder incontinence,  no real pattern in terms of exposure. was better last summer with no rx for weeks at a time and nothing obvious triggering it in her environment.  coughs to hard she looses her voice, breathing but no gagging or vomit.    Has tessilon but hasn't tried it. Imp was upper airway cough syndrome  REC:   Prevacid 30 mg  Take  one 30-60 min before first meal of the day  3)  GERD diet and  No oil based vitamins  Jan 23, 2009 ov some better but now has sensation of something stuck in throat that is typically worse in am and after muliple throat clearing attempts  bringing up small amts of bloody mucus, but only in am's. denies waking up prematurely with it.  Pt denies any significant sore throat, nasal congestion or excess secretions, fever, chills, sweats, unintended wt loss, pleuritic or exertional cp, orthopnea pnd or leg swelling.  no itcing or sneezing.  Current Medications (verified): 1)  Vicodin 5-500 Mg Tabs (Hydrocodone-Acetaminophen) .Marland Kitchen.. 1 Every 6-8 Hours As Needed 2)  Claritin 10 Mg Tabs (Loratadine) .Marland Kitchen.. 1 Once Daily As Needed 3)  Duetact 30-2 Mg Tabs (Pioglitazone Hcl-Glimepiride) .Marland Kitchen.. 1 Once Daily 4)  Vitamin D 45409 Unit Caps (Ergocalciferol) .Marland Kitchen.. 1 Cap By Mouth Twice Wkly 5)  Calcium 500/d 500-200 Mg-Unit Tabs (Calcium Carbonate-Vitamin D) .Marland Kitchen.. 1 Once Daily 6)  Prevacid 30 Mg Cpdr (Lansoprazole) .Marland Kitchen.. 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.3)        - Dx 08/2004, surgery, RT and Chemo  completed in 2006 adjuvant     DIABETES, TYPE 2 (ICD-250.00)    COUGH  onset around 2007         - CT Chest nl 01/06/09        - CT sinus ordered Jan 23, 2009   Family History:    Reviewed history from 11/28/2008 and no changes required:       Prostate CA- Father  Social History:    Reviewed history from 11/28/2008 and no changes required:       Never smoker       Married       2 Children       Retired Runner, broadcasting/film/video  Vital Signs:  Patient profile:   65 year old female Weight:      250.38 pounds BMI:     49.08 Temp:     98.2 degrees F oral Pulse rate:   95 / minute BP sitting:   130 / 64  (left arm) Cuff size:   large  Vitals Entered ByVernie Murders (Jan 23, 2009 4:36 PM)  O2 Sat on room air at rest %:  98  Physical Exam  Additional Exam:  wt 252 November 28, 2008 > 250 Jan 23, 2009  Ambulatory healthy obese bf  in no acute distress. Afeb with normal vital signs HEENT: nl dentition, minimal nonspecific swelling of turbinates, and orophanx. Nl external ear canals without cough reflex Neck without JVD/Nodes/TM Lungs clear to A and P bilaterally without cough on insp or exp maneuvers RRR no s3 or murmur or increase in P2 Abd soft and benign with nl excursion in the supine position. No bruits or organomegaly Ext warm without calf tenderness, cyanosis clubbing or edema    CT of Chest  Procedure date:  01/06/2009  Findings:      wnl  Impression & Recommendations:  Problem # 1:  COUGH (ICD-786.2) Classic upper airway cough syndrome, so named because it's frequently impossible to sort out how much is LPR vs CR/sinusitis with freq throat clearing generating secondary extra esophageal GERD from wide swings in gastric pressure that occur with throat clearing, promoting self  use of mint and menthol lozenges that reduce the lower esophageal sphincter tone and exacerbate the problem further These are the same pts who not infrequently have failed to tolerate ace inhibitors or report having reflux symptoms that don't respond to standard doses of PPI.   For now try adding chlortrimeton at hs to regimen and proceeding with sinus ct.   Emphasized The standardized cough guidelines recently published in Chest are a14 step process, not a single office visit, to address this problem and the alogrithm is almost  entirely related at each progressive step  to determining response to therapy with minimal addtional testing needed. Therefore if compliance is an issue this empiric standardized approach simply won't work   Medications Added to Medication List This Visit: 1)  Prevacid 30 Mg Cpdr (Lansoprazole) .Marland Kitchen.. 1 by mouth 30-60 min before breakfast 2)  Nexium 40 Mg Cpdr (Esomeprazole magnesium) .... By mouth daily. take one half hour before eating.  Other Orders: T-2 View CXR (71020TC) Radiology Referral (Radiology) Est. Patient Level IV (78469)  Patient Instructions: 1)  See Patient Care Coordinator before leaving for sinus ct scan  eval cough 2)  mucinex dm 1-2 every 12 hours as needed for cough. 3)  chlortrimeton 4mg  tablets 1 @ bedtime  4)  continue acid suppression in the form Nexium 40 mg Take  one 30-60 min before first meal of the day  Prescriptions: NEXIUM 40 MG  CPDR (ESOMEPRAZOLE MAGNESIUM) By mouth daily. Take one half hour before eating.  #34 x 3   Entered and Authorized by:   Nyoka Cowden MD   Signed by:   Vernie Murders on 01/23/2009   Method used:   Electronically to        [facility] [address] (retail)       [phone]       [place], [place]  [address]       Ph: [phone]       Fax: [phone]   RxID:   [id number]

## 2010-10-23 NOTE — Letter (Signed)
Summary: Cough/[facility]  Cough/[facility]   Imported By: Lester Lincoln 11/29/2008 11:16:34  _____________________________________________________________________  External Attachment:    Type:   Image     Comment:   External Document

## 2010-10-25 NOTE — Assessment & Plan Note (Signed)
Summary: Pulmonary/ ext ov > not using h1 daytime  > repeat trial   Copy to:  Dr. Shaune Pollack Primary Provider/Referring Provider:  Dr. Shaune Pollack  CC:  Cough- some better.  History of Present Illness: 65  yobf never smoker with Positive PPD in her 20's followed in pulmonary clinic for chronic cough.  November 28, 2008 pulmonary ov for new cough since 2007,  dry in nature,  comes in spells without a pattern except  not typically waking up from sleep, not exacerbating early am assoc with bladder incontinence,  no real pattern in terms of exposure. was better  summer  2009 with no rx for weeks at a time and nothing obvious triggering it in her environment.  coughs to hard she looses her voice and breath but no gagging or vomit. Imp was upper airway cough syndrome  REC:   Prevacid 30 mg  Take  one 30-60 min before first meal of the day  3)  GERD diet and  No oil based vitamins  Jan 23, 2009 ov  cc cough some  better but now has sensation of something stuck in throat that is typically worse in am and after muliple throat clearing attempts  bringing up small amts of bloody mucus, but only in am's. denies waking up prematurely with it.   rec try h2 at hs and check sinus ct > neg, did not return for f/u.  September 13, 2010 ov cc cough intermittent ( mostly present but gone the month of July and started back with the weather changes  this past fall)  very frustrated that cough" never better"  and worse incontinent and now occurring while sleeping and consumed with the notion that her cancer is the cause but note pattern of complete remission from coughing for a month at a time, both times during the summer months.  Has never been allergy tested.    September 28, 2010 ov complete resolution on tramadol  taking duetact 30/2 x 3 years and saw it causes cough. did not follow instructions re use of H1 to suppress urge to clear her throat.  Pt denies any significant sore throat, dysphagia, itching, sneezing,  nasal  congestion or excess secretions,  fever, chills, sweats, unintended wt loss, pleuritic or exertional cp, hempoptysis, change in activity tolerance  orthopnea pnd or leg swelling     Current Medications (verified): 1)  Duetact 30-2 Mg Tabs (Pioglitazone Hcl-Glimepiride) .Marland Kitchen.. 1 Once Daily 2)  Tramadol Hcl 50 Mg  Tabs (Tramadol Hcl) .... One To Two By Mouth Every 4-6 Hours Until No Longer 3)  Chlor-Trimeton 4 Mg Tabs (Chlorpheniramine Maleate) .Marland Kitchen.. 1 At Bedtime As Needed 4)  Famotidine 20 Mg Tabs (Famotidine) .Marland Kitchen.. 1 At Bedtime 5)  Delsym 30 Mg/28ml Lqcr (Dextromethorphan Polistirex) .... 2 Tsp Every 12 Hrs As Needed 6)  Prilosec 20 Mg Cpdr (Omeprazole) .Marland Kitchen.. 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)     - Dx 08/2004, surgery, RT and Chemo  completed in 2006 adjuvant  DIABETES, TYPE 2 (ICD-250.00) COUGH  onset around 2007      - CT Chest nl 01/06/09     - CT sinus  neg  01/25/2009     - Allergy profile  September 13, 2010 >>>  dust mites only  Vital Signs:  Patient profile:   65 year old female Weight:  236.50 pounds O2 Sat:      98 % on Room air Pulse rate:   86 / minute BP sitting:   140 / 76  (right arm) Cuff size:   large  Vitals Entered By: Vernie Murders (September 28, 2010 9:59 AM)  O2 Flow:  Room air  Physical Exam  Additional Exam:  wt 252 November 28, 2008 > 250 Jan 23, 2009 > 239 September 13, 2010 > 236 September 28, 2010  amb bf with occ throat clearing HEENT: nl dentition, nl oropharanx, moderate  nonspecific swelling of turbinates with increased mucoid secritons Nl external ear canals without cough reflex Neck without JVD/Nodes/TM Lungs clear to A and P bilaterally without cough on insp or exp maneuvers RRR no s3 or murmur or increase in P2 Abd soft and benign with nl excursion in the supine position. No bruits or organomegaly Ext warm  without calf tenderness, cyanosis clubbing or edema    Impression & Recommendations:  Problem # 1:  COUGH (ICD-786.2) The most common causes of chronic cough in immunocompetent adults include: upper airway cough syndrome (UACS), previously referred to as postnasal drip syndrome,  caused by variety of rhinosinus conditions; (2) asthma; (3) GERD; (4) chronic bronchitis from cigarette smoking or other inhaled environmental irritants; (5) nonasthmatic eosinophilic bronchitis; and (6) bronchiectasis. These conditions, singly or in combination, have accounted for up to 94% of the causes of chronic cough in prospective studies.   This is most c/w  Classic Upper airway cough syndrome, so named because it's frequently impossible to sort out how much is  CR/sinusitis with freq throat clearing (which can be related to primary GERD)   vs  causing  secondary extra esophageal GERD from wide swings in gastric pressure that occur with throat clearing, promoting self use of mint and menthol lozenges that reduce the lower esophageal sphincter tone and exacerbate the problem further These are the same pts who not infrequently have failed to tolerate ace inhibitors,  dry powder inhalers or biphosphonates or report having reflux symptoms that don't respond to standard doses of PPI   I had an extended discussion with the patient today lasting 15 to 20 minutes of a 25 minute visit on the following issues:  Given the tendency to completely resolve off rx for months then relapse with fall changes she very well may have nothing more than a mild pnds/allergic rhinitis but this doesn't correlate with dust mites, which are perennial, or certainly cancer dx, her concern, since this is progressive.  nevertheless studies with chronic cough have shown that the idea that "something's wrong and you need to figure out what it is" can be consuming and cause depression/anxiety in the chronic cougher.  Therefore CT chest then FOB looking for  eos could be considered should the cough prove refractory.    Medications Added to Medication List This Visit: 1)  Famotidine 20 Mg Tabs (Famotidine) .Marland Kitchen.. 1 at bedtime 2)  Prilosec 20 Mg Cpdr (Omeprazole) .Marland Kitchen.. 1 30 min before fiirst meal of the day 3)  Chlor-trimeton 4 Mg Tabs (Chlorpheniramine maleate) .Marland Kitchen.. 1 at bedtime as needed 4)  Delsym 30 Mg/52ml Lqcr (Dextromethorphan polistirex) .... 2 tsp every 12 hrs as needed  Other Orders: Est. Patient Level IV (16109)  Patient Instructions: 1)  Prilosec before bfast and pepcid 20 mg at bedtime  2)  Chlortrimeton 4 mg one at bedtime automatically and as needed every 6 hours if sense throat drainage 3)  Take delsym two  tsp every 12 hours and add tramadol 50 mg up to every 4 hours to suppress the urge to cough. Swallowing water or using ice chips/non mint and menthol containing candies (such as lifesavers or sugarless jolly ranchers) are also effective.  4)  GERD (REFLUX) diet 5)  If not satisfied the next step may be to repeat a CT scan of your chest - call after 2 week if not satisfied.

## 2010-10-25 NOTE — Miscellaneous (Signed)
Summary: Allergens Test/[facility]  Allergens Test/[facility]   Imported By: Lanelle Bal 10/02/2010 11:05:46  _____________________________________________________________________  External Attachment:    Type:   Image     Comment:   External Document

## 2010-10-25 NOTE — Progress Notes (Signed)
Summary: allergy results given  Phone Note Outgoing Call   Call placed by: Vernie Murders,  September 26, 2010 9:33 AM Call placed to: Patient Summary of Call: Called pt to inform that per MW- her labs showed that she is only allergic to dust mites- everything else was normal.  Pt verbalized understanding and states will keep planned rov to discuss this further.  Initial call taken by: Vernie Murders,  September 26, 2010 9:34 AM

## 2010-10-25 NOTE — Assessment & Plan Note (Signed)
Summary: Pulmonary/ ext ov for recurrent cough   Copy to:  Dr. Shaune Pollack Primary Provider/Referring Provider:  Dr. Shaune Pollack  CC:  Cough- worse.  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  November 28, 2008 pulmonary ov for new cough since 2007,  dry in nature,  comes in spells without a pattern except  not typically waking up from sleep, not exacerbating early am assoc with bladder incontinence,  no real pattern in terms of exposure. was better last summer with no rx for weeks at a time and nothing obvious triggering it in her environment.  coughs to hard she looses her voice, breathing but no gagging or vomit. Imp was upper airway cough syndrome  REC:   Prevacid 30 mg  Take  one 30-60 min before first meal of the day  3)  GERD diet and  No oil based vitamins  Jan 23, 2009 ov some better but now has sensation of something stuck in throat that is typically worse in am and after muliple throat clearing attempts  bringing up small amts of bloody mucus, but only in am's. denies waking up prematurely with it.   rec try h2 at hs and check sinus ct > neg, did not return for f/u.  September 13, 2010 ov cc cough intermittent ( mostly present gone the month of July and started bad with the weather changed this past fall)  very frustrated that cough never better and worse incontinent and now occurring while sleeping and consumed with the notion that her cancer is the cause but note pattern of complete remission from coughing for a month at a time, both times during the summer months.  Has never been allergy tested.  no purulent or excessive mucus production. Pt denies any significant sore throat, dysphagia, itching, sneezing,  nasal congestion or excess secretions,  fever, chills, sweats, unintended wt loss, pleuritic or exertional cp, hempoptysis, change in activity tolerance  orthopnea pnd or leg swelling Pt also denies any obvious fluctuation in symptoms with weather  or environmental change or other alleviating or aggravating factors.       Current Medications (verified): 1)  Duetact 30-2 Mg Tabs (Pioglitazone Hcl-Glimepiride) .Marland Kitchen.. 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)     - Dx 08/2004, surgery, RT and Chemo  completed in 2006 adjuvant  DIABETES, TYPE 2 (ICD-250.00) COUGH  onset around 2007      - CT Chest nl 01/06/09     - CT sinus  neg      - Allergy profeile September 13, 2010 >>>  Vital Signs:  Patient profile:   65 year old female Weight:      239 pounds BMI:     46.85 O2 Sat:      97 % on Room air Temp:     97.8 degrees F oral Pulse rate:   99 / minute BP sitting:   120 / 60  (left arm) Cuff size:   large  Vitals Entered By: Vernie Murders (September 13, 2010 10:18 AM)  O2 Flow:  Room air  Physical Exam  Additional Exam:  wt 252 November 28, 2008 > 250 Jan 23, 2009 > 239 September 13, 2010  HEENT: nl dentition, nl oropharanx, moderate  nonspecific swelling of turbinates with increased mucoid secritons Nl external ear canals without cough reflex Neck without  JVD/Nodes/TM Lungs clear to A and P bilaterally without cough on insp or exp maneuvers RRR no s3 or murmur or increase in P2 Abd soft and benign with nl excursion in the supine position. No bruits or organomegaly Ext warm without calf tenderness, cyanosis clubbing or edema    CXR  Procedure date:  07/17/2010  Findings:         Comparison: 11/24/2007, chest CT 01/05/2009    Findings: Left axillary clips are noted.  The heart size is normal.   The lungs are clear.  No pleural effusion.  Widening of the right   [facility] joint distance could signify prior distal clavicular resection   or resorption.    IMPRESSION:   No acute cardiopulmonary process.  Impression & Recommendations:  Problem # 1:  COUGH (ICD-786.2) The most common causes of chronic cough in  immunocompetent adults include: upper airway cough syndrome (UACS), previously referred to as postnasal drip syndrome,  caused by variety of rhinosinus conditions; (2) asthma; (3) GERD; (4) chronic bronchitis from cigarette smoking or other inhaled environmental irritants; (5) nonasthmatic eosinophilic bronchitis; and (6) bronchiectasis. These conditions, singly or in combination, have accounted for up to 94% of the causes of chronic cough in prospective studies.   This is most c/w  Classic Upper airway cough syndrome, so named because it's frequently impossible to sort out how much is  CR/sinusitis with freq throat clearing (which can be related to primary GERD)   vs  causing  secondary extra esophageal GERD from wide swings in gastric pressure that occur with throat clearing, promoting self use of mint and menthol lozenges that reduce the lower esophageal sphincter tone and exacerbate the problem further These are the same pts who not infrequently have failed to tolerate ace inhibitors,  dry powder inhalers or biphosphonates or report having reflux symptoms that don't respond to standard doses of PPI   See instructions for specific recommendations  NB  The standardized cough guidelines recently published in Chest are a 14 step process, not a single office visit,  and are intended  to address this problem logically,  with an alogrithm dependent on response to each progressive step  to determine a specific diagnosis with  minimal addtional testing needed. Therefore if compliance is an issue this empiric standardized approach simply won't work.   Medications Added to Medication List This Visit: 1)  Prednisone 10 Mg Tabs (Prednisone) .... 4 each am x 2days, 2x2days, 1x2days and stop 2)  Tramadol Hcl 50 Mg Tabs (Tramadol hcl) .... One to two by mouth every 4-6 hours until no longer  Other Orders: T-Allergy Profile [place], [place], [place], [place], [place] [phone]) Est. Patient Level IV [phone])  Patient  Instructions: 1)  Prednisone 4 each am x 2days, 2x2days, 1x2days and stop  2)  Prilosec before bfast and pepcid 20 mg at bedtime as long as coughing ( reflux is to cough what oxygen is to fire)  3)  Chlortrimeton 4 mg one at bedtime automatically and as needed every 6 hours if sense throat drainage 4)  Take delsym two tsp every 12 hours and add tramadol 50 mg up to every 4 hours to suppress the urge to cough. Swallowing water or using ice chips/non mint and menthol containing candies (such as lifesavers or sugarless jolly ranchers) are also effective.  5)  GERD (REFLUX)  is a common cause of respiratory symptoms. It commonly presents without heartburn and can be treated with medication, but also with lifestyle changes including avoidance  of late meals, excessive alcohol, smoking cessation, and avoid fatty foods, chocolate, peppermint, colas, red wine, and acidic juices such as orange juice. NO MINT OR MENTHOL PRODUCTS SO NO COUGH DROPS  6)  USE SUGARLESS CANDY INSTEAD (jolley ranchers)  7)  NO OIL BASED VITAMINS  8)  Please schedule a follow-up appointment in 2 weeks, sooner if needed with all medications in hand. 9)  discussed also: 10)  Unlike when you get a prescription for eyeglasses, it's not possible to always walk out of this or any medical office with a perfect prescription that is immediately effective  based on any test that we offer here.  On the contrary, it may take several weeks for the full impact of changes recommened today - hopefully you will respond well.  If not, then we'll adjust your medication on your next visit accordingly, knowing more then than we can possibly know now.    Prescriptions: TRAMADOL HCL 50 MG  TABS (TRAMADOL HCL) One to two by mouth every 4-6 hours until no longer  #40 x 0   Entered and Authorized by:   Nyoka Cowden MD   Signed by:   Nyoka Cowden MD on 09/13/2010   Method used:   Electronically to        [facility] [address] (retail)        [phone]       [place], [place]  [address]       Ph: [phone]       Fax: [phone]   RxID:   [id number] PREDNISONE 10 MG  TABS (PREDNISONE) 4 each am x 2days, 2x2days, 1x2days and stop  #14 x 0   Entered and Authorized by:   Nyoka Cowden MD   Signed by:   Nyoka Cowden MD on 09/13/2010   Method used:   Electronically to        [facility] [address] (retail)       [phone]       [place], [place]  [address]       Ph: [phone]       Fax: [phone]   RxID:   [id number]    Immunization History:  Influenza Immunization History:    Influenza:  historical (07/24/2010)

## 2010-11-02 ENCOUNTER — Telehealth (INDEPENDENT_AMBULATORY_CARE_PROVIDER_SITE_OTHER): Payer: Self-pay | Admitting: *Deleted

## 2010-11-08 NOTE — Progress Notes (Signed)
Summary: cough no better > needs ov<<appt scheduled  Phone Note Call from Patient Call back at Home Phone [phone]   Caller: Patient Call For: Wert Summary of Call: Spoke with pt.  She states calling to give update on cough per MW request.  She states that her cough is no better, maybe some worse over the past few days. Now cough is sometimes prod with greenish sputum- sometimes with "dots of blood"  She is still needing to take the tramadol, delsym and chlotrimeton daily to control cough.  Has tried without taking these and she states cough gets much worse.  Last ov note states next step is to order ct chest.  Pls advise thanks! Initial call taken by: Vernie Murders,  November 02, 2010 11:27 AM  Follow-up for Phone Call        no need for ct yet but needs ov with all active  meds in hand to regroup.  remind her that The standardized cough guidelines recently published in Chest are a 14 step process  and are intended  to address this problem logically,  step by step, and we may want to order a ct next ov but not without first regrouping on med rx (prev ct was nl and this is expensive with lots of radiation exposure) Follow-up by: Nyoka Cowden MD,  November 02, 2010 12:56 PM  Additional Follow-up for Phone Call Additional follow up Details #1::        Pt to see MW on 11/15/2010 @ 4pm and bring all meds to appt.  Appt slot doubled because pt needs late appt. Additional Follow-up by: Michel Bickers CMA,  November 02, 2010 1:50 PM

## 2010-11-15 ENCOUNTER — Ambulatory Visit (INDEPENDENT_AMBULATORY_CARE_PROVIDER_SITE_OTHER): Payer: Medicare Other | Admitting: Internal Medicine

## 2010-11-15 ENCOUNTER — Encounter: Payer: Self-pay | Admitting: Internal Medicine

## 2010-11-15 ENCOUNTER — Other Ambulatory Visit: Payer: Self-pay | Admitting: Internal Medicine

## 2010-11-15 DIAGNOSIS — R053 Chronic cough: Secondary | ICD-10-CM

## 2010-11-15 DIAGNOSIS — R05 Cough: Secondary | ICD-10-CM

## 2010-11-15 DIAGNOSIS — R059 Cough, unspecified: Secondary | ICD-10-CM

## 2010-11-20 ENCOUNTER — Encounter: Payer: Self-pay | Admitting: Internal Medicine

## 2010-11-20 NOTE — Assessment & Plan Note (Signed)
Summary: Pulmonayr/ ext f/u eval for cough > ct scans next   Copy to:  Dr. Shaune Pollack Primary Provider/Referring Provider:  Dr. Shaune Pollack  CC:  Cough- worse.  History of Present Illness: 65  yobf never smoker with Positive PPD in her 20's followed in pulmonary clinic for chronic cough.  November 28, 2008 pulmonary ov for new cough since 2007,  dry in nature,  comes in spells without a pattern except  not typically waking up from sleep, not exacerbating early am assoc with bladder incontinence,  no real pattern in terms of exposure. was better  summer  2009 with no rx for weeks at a time and nothing obvious triggering it in her environment.  coughs to hard she looses her voice and breath but no gagging or vomit. Imp was upper airway cough syndrome  REC:   Prevacid 30 mg  Take  one 30-60 min before first meal of the day  3)  GERD diet and  No oil based vitamins  Jan 23, 2009 ov  cc cough some  better but now has sensation of something stuck in throat that is typically worse in am and after muliple throat clearing attempts  bringing up small amts of bloody mucus, but only in am's. denies waking up prematurely with it.   rec try h2 at hs and check sinus ct > neg, did not return for f/u.  September 13, 2010 ov cc cough intermittent ( mostly present but gone the month of July and started back with the weather changes  this past fall)  very frustrated that cough" never better"  and worse incontinent and now occurring while sleeping and consumed with the notion that her cancer is the cause but note pattern of complete remission from coughing for a month at a time, both times during the summer months.  Has never been allergy tested.    September 28, 2010 ov complete resolution on tramadol  taking duetact 30/2 x 3 years and saw it causes cough. did not follow instructions re use of H1 to suppress urge to clear her throat.  rec max gerd rx plus h1 as needed as per guidlines > minimally betterFebruary 23, 2012  ov cough only transiently better, now worse, dry, assoc with urinary incontinence, appears compliant with rx though never really suppressed the cough aggressively to eliminate cyclical coughing  Pt denies any significant sore throat, dysphagia, itching, sneezing,  nasal congestion or excess secretions,  fever, chills, sweats, unintended wt loss, pleuritic or exertional cp, hempoptysis, change in activity tolerance  orthopnea pnd or leg swelling Pt also denies any obvious fluctuation in symptoms with weather or environmental change or other alleviating or aggravating factors.       Current Medications (verified): 1)  Onglyza 5 Mg Tabs (Saxagliptin Hcl) .Marland Kitchen.. 1 Once Daily 2)  Famotidine 20 Mg Tabs (Famotidine) .Marland Kitchen.. 1 At Bedtime 3)  Prilosec 20 Mg Cpdr (Omeprazole) .Marland Kitchen.. 1 30 Min Before Fiirst Meal of The Day 4)  Tramadol Hcl 50 Mg  Tabs (Tramadol Hcl) .... One To Two By Mouth Every 4-6 Hours Until No Longer 5)  Chlor-Trimeton 4 Mg Tabs (Chlorpheniramine Maleate) .Marland Kitchen.. 1 At Bedtime As Needed 6)  Delsym 30 Mg/59ml Lqcr (Dextromethorphan Polistirex) .... 2 Tsp Every 12 Hrs As Needed 7)  Aleve 220 Mg Tabs (Naproxen Sodium) .... 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)     -  Dx 08/2004, surgery, RT and Chemo  completed in 2006 adjuvant  DIABETES, TYPE 2 (ICD-250.00) COUGH  onset around 2007      - CT Chest nl 01/06/09 > repeat ordered November 15, 2010      - CT sinus  neg  01/25/2009 > repeat ordered November 15, 2010      - Allergy profile  September 13, 2010 >>>  dust mites only  Vital Signs:  Patient profile:   65 year old female Weight:      233 pounds O2 Sat:      95 % on Room air Temp:     98.1 degrees F oral Pulse rate:   103 / minute BP sitting:   120 / 78  (left arm) Cuff size:   large  Vitals Entered By: Vernie Murders (November 15, 2010 4:09 PM)  O2 Flow:  Room  air  Physical Exam  Additional Exam:  wt 252 November 28, 2008 > 250 Jan 23, 2009 > 239 September 13, 2010 > 236 September 28, 2010 > 233 November 15, 2010  amb bf with occ throat clearing HEENT: nl dentition, nl oropharanx, moderate  nonspecific swelling of turbinates with increased mucoid secritons Nl external ear canals without cough reflex Neck without JVD/Nodes/TM Lungs clear to A and P bilaterally without cough on insp or exp maneuvers RRR no s3 or murmur or increase in P2 Abd soft and benign with nl excursion in the supine position. No bruits or organomegaly Ext warm without calf tenderness, cyanosis clubbing or edema    Impression & Recommendations:  Problem # 1:  COUGH (ICD-786.2)  The most common causes of chronic cough in immunocompetent adults include: upper airway cough syndrome (UACS), previously referred to as postnasal drip syndrome,  caused by variety of rhinosinus conditions; (2) asthma; (3) GERD; (4) chronic bronchitis from cigarette smoking or other inhaled environmental irritants; (5) nonasthmatic eosinophilic bronchitis; and (6) bronchiectasis. These conditions, singly or in combination, have accounted for up to 94% of the causes of chronic cough in prospective studies.   This is most c/w  Classic Upper airway cough syndrome, so named because it's frequently impossible to sort out how much is  CR/sinusitis with freq throat clearing (which can be related to primary GERD)   vs  causing  secondary extra esophageal GERD from wide swings in gastric pressure that occur with throat clearing, promoting self use of mint and menthol lozenges that reduce the lower esophageal sphincter tone and exacerbate the problem further These are the same pts who not infrequently have failed to tolerate ace inhibitors,  dry powder inhalers or biphosphonates or report having reflux symptoms that don't respond to standard doses of PPI   I had an extended discussion with the patient today lasting 15 to  20 minutes of a 25 minute visit on the following issues:  Given the tendency to completely resolve off rx for months then relapse with fall changes she very well may have nothing more than a mild pnds/allergic rhinitis but this doesn't correlate with dust mites, which are perennial, or certainly cancer dx, her concern, since this is progressive.  nevertheless studies with chronic cough have shown that the idea that "something's wrong and you need to figure out what it is" can be consuming and cause depression/anxiety in the chronic cougher.  Therefore CT chest/sinus then  methacholine challenge then FOB looking for eos could be considered should the cough prove refractory in that order following the most recently  published cough guidelines.       Medications Added to Medication List This Visit: 1)  Onglyza 5 Mg Tabs (Saxagliptin hcl) .Marland Kitchen.. 1 once daily 2)  Tramadol Hcl 50 Mg Tabs (Tramadol hcl) .... One to two by mouth every 4-6 hours until no longer 3)  Aleve 220 Mg Tabs (Naproxen sodium) .... As directed as needed 4)  Prednisone 10 Mg Tabs (Prednisone) .... 4 each am x 2days, 2x2days, 1x2days and stop  Other Orders: Misc. Referral (Misc. Ref) Est. Patient Level IV ([address])  Patient Instructions: 1)  Prednisone 4 each am x 2days, 2x2days, 1x2days and stop  2)  Take delsym two tsp every 12 hours and add tramadol 50 mg up to 2 every 4 hours to suppress the urge to cough. Swallowing water or using ice chips/non mint and menthol containing candies (such as lifesavers or sugarless jolly ranchers) are also effective.  3)  See Patient Care Coordinator before leaving for for CT Chest and Sinus after we get your bloodwork from [facility] (bmet, cbc with diff and IgE level) 4)  . Prescriptions: TRAMADOL HCL 50 MG  TABS (TRAMADOL HCL) One to two by mouth every 4-6 hours until no longer  #60 x 0   Entered and Authorized by:   Nyoka Cowden MD   Signed by:   Nyoka Cowden MD on 11/15/2010   Method used:    Electronically to        [facility] [address] (retail)       [phone]       [place], [place]  [address]       Ph: [phone]       Fax: [phone]   RxID:   [id number] PREDNISONE 10 MG  TABS (PREDNISONE) 4 each am x 2days, 2x2days, 1x2days and stop  #14 x 0   Entered and Authorized by:   Nyoka Cowden MD   Signed by:   Nyoka Cowden MD on 11/15/2010   Method used:   Electronically to        [facility] [address] (retail)       [phone]       [place], [place]  [address]       Ph: [phone]       Fax: [phone]   RxID:   [id number]

## 2010-11-20 NOTE — Miscellaneous (Signed)
Summary: Orders Update  Clinical Lists Changes  Orders: Added new Test order of T-IgE (Immunoglobulin E) ([id number]) - Signed Added new Test order of T-CBC w/Diff [phone]) - Signed Added new Test order of T-Basic Metabolic Panel [phone]) - Signed

## 2010-11-21 ENCOUNTER — Telehealth (INDEPENDENT_AMBULATORY_CARE_PROVIDER_SITE_OTHER): Payer: Self-pay | Admitting: *Deleted

## 2010-11-26 ENCOUNTER — Ambulatory Visit (INDEPENDENT_AMBULATORY_CARE_PROVIDER_SITE_OTHER)
Admission: RE | Admit: 2010-11-26 | Discharge: 2010-11-26 | Disposition: A | Discharge status: Home / Self Care | Payer: Medicare Other | Source: Ambulatory Visit | Attending: Internal Medicine | Admitting: Internal Medicine

## 2010-11-26 DIAGNOSIS — R059 Cough, unspecified: Secondary | ICD-10-CM

## 2010-11-26 DIAGNOSIS — R05 Cough: Secondary | ICD-10-CM

## 2010-11-26 DIAGNOSIS — R053 Chronic cough: Secondary | ICD-10-CM

## 2010-11-26 MED ORDER — IOHEXOL 300 MG/ML  SOLN
100.0000 mL | Freq: Once | INTRAMUSCULAR | Status: AC | PRN
  Administered 2010-11-26: 100 mL via INTRAVENOUS

## 2010-11-28 ENCOUNTER — Telehealth (INDEPENDENT_AMBULATORY_CARE_PROVIDER_SITE_OTHER): Payer: Self-pay | Admitting: *Deleted

## 2010-11-29 NOTE — Progress Notes (Signed)
Summary: pharmacy calling re: rx  Phone Note From Pharmacy   Caller: colin with [facility] Call For: wert  Summary of Call: says he had sent a fax re: clarification for rx tramadol for cough. [phone] Initial call taken by: Tivis Ringer, CNA,  November 21, 2010 11:32 AM  Follow-up for Phone Call        Spoke with Ayesha Rumpf and advised that the tramadol is as needed for cough and he verbalized understanding.  Follow-up by: Vernie Murders,  November 21, 2010 1:45 PM

## 2010-12-04 NOTE — Progress Notes (Signed)
Summary: Needs ov to regroup > ok to   Phone Note Call from Patient Call back at Home Phone [phone] [facility]     Caller: Patient Call For: wert Summary of Call: pt called to make a f/u w/ dr wert (for 2 wks from [place]). she said she could only come in the afternoon (because of work). wants to speak to dr wert's nurse re: recs- as far as CT results go. call home # or cell [phone] Initial call taken by: Tivis Ringer, CNA,  November 28, 2010 8:53 AM  Follow-up for Phone Call        needs ov before March 20 with all meds in hand Follow-up by: Nyoka Cowden MD,  November 28, 2010 11:27 AM  Additional Follow-up for Phone Call Additional follow up Details #1::        LMOMTCB Vernie Murders  November 28, 2010 5:13 PM  Spoke with pt and advised needs ov with all meds before 3/20.  You have nothing available at all until 3/30.  I offered that maybe she could see TP and she refused.  Pls advise if you wish to [facility], thanks   Additional Follow-up by: Vernie Murders,  November 29, 2010 4:12 PM    Additional Follow-up for Phone Call Additional follow up Details #2::    fine to overbook Follow-up by: Nyoka Cowden MD,  November 29, 2010 5:07 PM  Additional Follow-up for Phone Call Additional follow up Details #3:: Details for Additional Follow-up Action Taken: [facility]  November 30, 2010 9:10 AM  Pt returning call can be reached at [phone].Darletta Moll  November 30, 2010 4:02 PM  Spoke with patient-appt scheduled for 12-05-2010 at 345pm with WERT.Reynaldo Minium CMA  November 30, 2010 4:30 PM

## 2010-12-05 ENCOUNTER — Encounter: Payer: Self-pay | Admitting: Internal Medicine

## 2010-12-05 ENCOUNTER — Ambulatory Visit (INDEPENDENT_AMBULATORY_CARE_PROVIDER_SITE_OTHER): Payer: Medicare Other | Admitting: Internal Medicine

## 2010-12-05 DIAGNOSIS — R05 Cough: Secondary | ICD-10-CM

## 2010-12-05 DIAGNOSIS — R059 Cough, unspecified: Secondary | ICD-10-CM

## 2010-12-05 LAB — COMPREHENSIVE METABOLIC PANEL
ALT: 21 U/L (ref 0–35)
AST: 26 U/L (ref 0–37)
Albumin: 3.5 g/dL (ref 3.5–5.2)
Alkaline Phosphatase: 62 U/L (ref 39–117)
BUN: 9 mg/dL (ref 6–23)
CO2: 26 mEq/L (ref 19–32)
Calcium: 8.8 mg/dL (ref 8.4–10.5)
Chloride: 104 mEq/L (ref 96–112)
Creatinine, Ser: 0.68 mg/dL (ref 0.4–1.2)
GFR calc Af Amer: >60 mL/min (ref >60)
GFR calc non Af Amer: >60 mL/min (ref >60)
Glucose, Bld: 89 mg/dL (ref 70–99)
Potassium: 3.4 mEq/L — ABNORMAL LOW (ref 3.5–5.1)
Sodium: 140 mEq/L (ref 135–145)
Total Bilirubin: 0.6 mg/dL (ref 0.3–1.2)
Total Protein: 6.9 g/dL (ref 6.0–8.3)

## 2010-12-05 LAB — DIFFERENTIAL
Basophils Absolute: 0 10*3/uL (ref 0.0–0.1)
Basophils Relative: 0 % (ref 0–1)
Eosinophils Absolute: 0.1 10*3/uL (ref 0.0–0.7)
Eosinophils Relative: 1 % (ref 0–5)
Lymphocytes Relative: 35 % (ref 12–46)
Lymphs Abs: 2.3 10*3/uL (ref 0.7–4.0)
Monocytes Absolute: 0.8 10*3/uL (ref 0.1–1.0)
Monocytes Relative: 12 % (ref 3–12)
Neutro Abs: 3.5 10*3/uL (ref 1.7–7.7)
Neutrophils Relative %: 53 % (ref 43–77)

## 2010-12-05 LAB — CBC
HCT: 38.2 % (ref 36.0–46.0)
Hemoglobin: 12.8 g/dL (ref 12.0–15.0)
MCH: 28.8 pg (ref 26.0–34.0)
MCHC: 33.4 g/dL (ref 30.0–36.0)
MCV: 86.3 fL (ref 78.0–100.0)
Platelets: 243 10*3/uL (ref 150–400)
RBC: 4.42 MIL/uL (ref 3.87–5.11)
RDW: 14.5 % (ref 11.5–15.5)
WBC: 6.7 10*3/uL (ref 4.0–10.5)

## 2010-12-05 LAB — LIPASE, BLOOD: Lipase: 15 U/L (ref 11–59)

## 2010-12-11 NOTE — Assessment & Plan Note (Signed)
Summary: Pulmonary/ ext cough f/u  > try singulair then mct   Copy to:  Dr. Shaune Pollack Primary Provider/Referring Provider:  Dr. Shaune Pollack  CC:  Cough- slightly improved.  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  November 28, 2008 pulmonary ov for new cough since 2007,  dry in nature,  comes in spells without a pattern except  not typically waking up from sleep, not exacerbating early am assoc with bladder incontinence,  no real pattern in terms of exposure. was better  summer  2009 with no rx for weeks at a time and nothing obvious triggering it in her environment.  coughs to hard she looses her voice and breath but no gagging or vomit. Imp was upper airway cough syndrome  REC:   Prevacid 30 mg  Take  one 30-60 min before first meal of the day  3)  GERD diet and  No oil based vitamins  Jan 23, 2009 ov  cc cough some  better but now has sensation of something stuck in throat that is typically worse in am and after muliple throat clearing attempts  bringing up small amts of bloody mucus, but only in am's. denies waking up prematurely with it.   rec try h2 at hs and check sinus ct > neg, did not return for f/u.  September 13, 2010 ov cc cough intermittent ( mostly present but gone the month of July and started back with the weather changes  this past fall)  very frustrated that cough" never better"  and worse incontinent and now occurring while sleeping and consumed with the notion that her cancer is the cause but note pattern of complete remission from coughing for a month at a time, both times during the summer months.  Has never been allergy tested.    September 28, 2010 ov complete resolution on tramadol  taking duetact 30/2 x 3 years and saw it causes cough. did not follow instructions re use of H1 to suppress urge to clear her throat.  rec max gerd rx plus h1 as needed as per guidelines > minimally  betterFebruary 23, 2012 ov cough only transiently better, now worse, dry, assoc with urinary incontinence, appears compliant with rx though never really suppressed the cough aggressively to eliminate cyclical coughing. Prednisone 4 each am x 2days, 2x2days, 1x2days and stop  Take delsym two tsp every 12 hours and add tramadol 50 mg up to 2 every 4 hours to suppress the urge to cough. Swallowing water or using ice chips/non mint and menthol containing candies (such as lifesavers or sugarless jolly ranchers) are also effective.  See Patient Care Coordinator before leaving for for CT Chest and Sinus after we get your bloodwork from [facility] (bmet, cbc with diff and IgE level)  December 05, 2010 ov cc cough better, no longer urinary incontinence,  still using delsym on avg once or twice daily, almost completely resolved on prednisone but concerned she can't keep using this for cough and don't have a definite dx yet.  Pt denies any significant sore throat, dysphagia, itching, sneezing,  nasal congestion or excess secretions,  fever, chills, sweats, unintended wt loss, pleuritic or exertional cp, hempoptysis, change in activity tolerance  orthopnea pnd or leg swelling Pt also denies any obvious fluctuation in symptoms with weather or environmental change or other alleviating or aggravating factors.  Current Medications (verified): 1)  Onglyza 5 Mg Tabs (Saxagliptin Hcl) .Marland Kitchen.. 1 Once Daily 2)  Famotidine 20 Mg Tabs (Famotidine) .Marland Kitchen.. 1 At Bedtime 3)  Prilosec 20 Mg Cpdr (Omeprazole) .Marland Kitchen.. 1 30 Min Before Fiirst Meal of The Day 4)  Tramadol Hcl 50 Mg  Tabs (Tramadol Hcl) .... One To Two By Mouth Every 4-6 Hours Until No Longer 5)  Chlor-Trimeton 4 Mg Tabs (Chlorpheniramine Maleate) .Marland Kitchen.. 1 At Bedtime As Needed 6)  Delsym 30 Mg/89ml Lqcr (Dextromethorphan Polistirex) .... 2 Tsp Every 12 Hrs As Needed 7)  Aleve 220 Mg Tabs (Naproxen Sodium) .... As Directed As Needed  Allergies (verified): No Known  Drug Allergies  Past History:  Past Medical History: NEOPLASM, MALIGNANT, BREAST, HX OF (ICD-V10.3)     - Dx 08/2004, surgery, RT and Chemo  completed in 2006 adjuvant  DIABETES, TYPE 2 (ICD-250.00) COUGH  onset around 2007      - CT Chest nl 01/06/09 > repeat 11/26/10 pos HH ow neg     - CT sinus  neg  01/25/2009 > repeat 11/26/10 neg     - Allergy profile  September 13, 2010 >>>  dust mites only  Vital Signs:  Patient profile:   65 year old female Weight:      231 pounds O2 Sat:      99 % on Room air Temp:     98.2 degrees F oral Pulse rate:   96 / minute BP sitting:   138 / 70  (left arm) Cuff size:   large  Vitals Entered By: Vernie Murders (December 05, 2010 3:54 PM)  O2 Flow:  Room air  Physical Exam  Additional Exam:  wt 252 November 28, 2008  > 233 November 15, 2010 > 231 December 05, 2010  amb bf with occ throat clearing HEENT: nl dentition, nl oropharanx, moderate  nonspecific swelling of turbinates with increased mucoid secritons Nl external ear canals without cough reflex Neck without JVD/Nodes/TM Lungs clear to A and P bilaterally without cough on insp or exp maneuvers RRR no s3 or murmur or increase in P2 Abd soft and benign with nl excursion in the supine position. No bruits or organomegaly Ext warm without calf tenderness, cyanosis clubbing or edema    Impression & Recommendations:  Problem # 1:  COUGH (ICD-786.2) The most common causes of chronic cough in immunocompetent adults include: upper airway cough syndrome (UACS), previously referred to as postnasal drip syndrome,  caused by variety of rhinosinus conditions; (2) asthma; (3) GERD; (4) chronic bronchitis from cigarette smoking or other inhaled environmental irritants; (5) nonasthmatic eosinophilic bronchitis; and (6) bronchiectasis. These conditions, singly or in combination, have accounted for up to 94% of the causes of chronic cough in prospective studies.  This is almost certainly  Classic Upper airway cough  syndrome, so named because it's frequently impossible to sort out how much is  CR/sinusitis with freq throat clearing (which can be related to primary GERD)   vs  causing  secondary extra esophageal GERD from wide swings in gastric pressure that occur with throat clearing, promoting self use of mint and menthol lozenges that reduce the lower esophageal sphincter tone and exacerbate the problem further These are the same pts who not infrequently have failed to tolerate ace inhibitors,  dry powder inhalers or biphosphonates or report having reflux symptoms that don't respond to standard doses of PPI  already treated gerd and pnds per guidlines and neg w/u so far x for dramaitic response  to prednisone which suggests eos bronchitis or   cough variant asthma so try singulair then proceed with MCT while on heavy GERD rx  Medications Added to Medication List This Visit: 1)  Singulair 10 Mg Tabs (Montelukast sodium) .... One by mouth daily  Other Orders: Misc. Referral (Misc. Ref) Prescription Created Electronically [phone]) Est. Patient Level IV (78469)  Patient Instructions: 1)  See Patient Care Coordinator before leaving for metacholine challenge testing in 2 weeks, not sooner 2)  Start singulair 10 one each evening  x 2 weeks and ok can cancel the study if you're better. Prescriptions: SINGULAIR 10 MG  TABS (MONTELUKAST SODIUM) One by mouth daily  #34 x 11   Entered and Authorized by:   Nyoka Cowden MD   Signed by:   Nyoka Cowden MD on 12/05/2010   Method used:   Electronically to        [facility]* (retail)       [id number]       [place], [place]  [address]       Ph: [phone]       Fax: [phone]   RxID:   [id number]

## 2010-12-24 ENCOUNTER — Encounter (HOSPITAL_COMMUNITY): Payer: Medicare Other

## 2011-01-02 ENCOUNTER — Ambulatory Visit (HOSPITAL_COMMUNITY)
Admission: RE | Admit: 2011-01-02 | Discharge: 2011-01-02 | Disposition: A | Discharge status: Home / Self Care | Payer: Medicare Other | Source: Ambulatory Visit | Attending: Internal Medicine | Admitting: Internal Medicine

## 2011-01-02 DIAGNOSIS — R05 Cough: Secondary | ICD-10-CM | POA: Insufficient documentation

## 2011-01-02 DIAGNOSIS — R059 Cough, unspecified: Secondary | ICD-10-CM | POA: Insufficient documentation

## 2011-01-21 ENCOUNTER — Telehealth: Payer: Self-pay | Admitting: Internal Medicine

## 2011-01-21 NOTE — Telephone Encounter (Signed)
ATC pt and inform MCT was negative for asthma and pt needs appt if still having symptoms. LMOMTCB.

## 2011-01-22 NOTE — Telephone Encounter (Signed)
Spoke w/ pt and advised her MCT results. Pt verbalized understanding and states she is still having problems. I advised pt then she needs to come in for OV. Pt states she will call back for an apt.

## 2011-01-22 NOTE — Telephone Encounter (Signed)
Returning call can be reached at [phone].Darletta Moll

## 2011-02-01 ENCOUNTER — Encounter: Payer: Self-pay | Admitting: Internal Medicine

## 2011-02-08 NOTE — Op Note (Signed)
[name], [name]              ACCOUNT NO.:  [id number]   MEDICAL RECORD NO.:  [id number]          PATIENT TYPE:  AMB   LOCATION:  DSC                          FACILITY:  [facility]   PHYSICIAN:  Currie Paris, M.D.DATE OF BIRTH:  Jan 12, 1946   DATE OF PROCEDURE:  08/02/2005  DATE OF DISCHARGE:                                 OPERATIVE REPORT   PREOPERATIVE DIAGNOSIS:  Unneeded Port-A-Cath.   POSTOPERATIVE DIAGNOSIS:  Unneeded Port-A-Cath.   OPERATION:  Port-A-Cath removal.   SURGEON:  Currie Paris, M.D.   ANESTHESIA:  Local.   CLINICAL HISTORY:  [name] has finished her chemotherapy and wished to  have her Port-A-Cath removed.   DESCRIPTION OF PROCEDURE:  The patient was seen in the minor procedure room.  She confirmed Port-A-Cath removal as the planned procedure.  Port-A-Cath  site was identified.  This consisted of the time-out.  The area was prepped  with alcohol and anesthetized with 10 mL of 1% Xylocaine with epinephrine.  I waited 10 minutes and we then prepped the area Betadine, draped it and  made an incision over the port.  The port was identified and the capsule  around it incised up.  This was freed up and backed out of the tract.  There  was no back-bleeding.  The incision was closed with 3-0 Vicryl and 4-0  Monocryl subcuticular plus Dermabond.   The patient tolerated the procedure well.  There were no complications.  The  patient was given the Port-A-Cath, as she wished to take this herself.      Currie Paris, M.D.  Electronically Signed     CJS/MEDQ  D:  08/02/2005  T:  08/03/2005  Job:  [id number]

## 2011-02-08 NOTE — Op Note (Signed)
[name], [name]              ACCOUNT NO.:  [id number]   MEDICAL RECORD NO.:  [id number]          PATIENT TYPE:  AMB   LOCATION:  DSC                          FACILITY:  [facility]   PHYSICIAN:  Currie Paris, M.D.DATE OF BIRTH:  07/12/1946   DATE OF PROCEDURE:  10/10/2004  DATE OF DISCHARGE:                                 OPERATIVE REPORT   OFFICE MEDICAL RECORD NUMBER:  [id number].   PREOPERATIVE DIAGNOSIS:  Cancer, left breast upper outer quadrant.   POSTOPERATIVE DIAGNOSIS:  Cancer, left breast upper outer quadrant.   OPERATION:  Needle guided removal of left breast cancer with blue dye  injection and sentinel lymph node biopsy.   SURGEON:  Currie Paris, M.D.   ANESTHESIA:  General.   CLINICAL HISTORY:  This patient is a 65 year old lady recently found to have  an upper outer quadrant left breast cancer.  It appeared to be about 2 cm or  just a little bit bigger by ultrasound and mammograms, but somewhat larger  than that on MRI.  After a lengthy discussion with the patient, we elected  to proceed to partial mastectomy (lumpectomy) with blue dye injection and  sentinel node evaluation.   DESCRIPTION OF PROCEDURE:  The patient was seen in the holding area and she  had no further questions.  She had initialed the left breast as the  operative side, as did I.  She had already had a guide wire placed into the  left breast and the films were reviewed.  She had no further questions.   She was taken to the operating room and after satisfactory general  anesthesia had been obtained, blue dye was injected subareolarly.  The  breast was prepped and draped.   I started out by making an elliptical incision over the area of the cancer  in the upper outer quadrant of the left breast.  I went superior well above  where I thought the mass was, then medial to the mass, then well inferior  and then starting medially went down to the chest wall, taking fascia,  starting from  that point and excised to the lateral aspect to about where  the guide wire entered.  The guide wire entered laterally and tracked  medially, so I thought that this went well around the mass.   By palpation the mass was in the middle of my specimen and I thought margins  were going to be negative.   I spent a few minutes making sure everything was dry.  I injected some  Marcaine to help with postoperative analgesia.  I put four metal clips in to  mark the margins of the excision and left a pack in place.   Attention was turned to the axilla.  Using the NeoProbe, I found a hot area.  I made a transverse incision and using self-retaining retractors got down  into the axilla proper.  I did not see any blue dye coming in any place in  here.  However, using the NeoProbe I readily identified about a 1.5 cm soft  node that was somewhat anterior and relatively  low in the axilla.  This was  excised with a coagulation current of the cautery.  It had counts up to  3500.  There was no blue dye that I could see entering it.   This was sent to pathology.  The axilla was carefully scanned with a  NeoProbe and I found no area with counts higher than about 20.  I could not  palpate any enlarged pathologic-appearing nodes nor with some further  dissection did I see any blue lymphatics entering the axilla.  A pack was  placed back here and attention turned back to the main incision in the  breast.  This was again checked for hemostasis,the pack removed and it was  closed.  Marcaine was used to help with analgesia.  I simply closed the  subcutaneous and then the skin, leaving a cavity to form a seroma cavity to  try to maintain normal form of the breast.   The axilla was rechecked and had remained dry while we were closing the  breast. The pack was removed and it was closed with 3-0 Vicryl and 4-0  Monocryl subcuticular.  Dr. Yolanda Bonine reported that we appeared to have the  tumor in the center of the  specimen.  Dr. Clelia Croft reported that the sentinel  node touch preps were negative and that the touch preps on the marginsf the  lumpectomy were likewise were negative.  Grossly Dr. Clelia Croft thought we had at  least a centimeter of margins in all directions.   The patient tolerated the procedure well.  There were no operative  complications.  All counts were correct.      Chri   CJS/MEDQ  D:  10/10/2004  T:  10/10/2004  Job:  [id number]   cc:   Almedia Balls. Fore, M.D.  [phone] [address] [place]  [place] [address]  Fax: [phone]   Dorisann Frames, M.D.  [phone] [address], [place] [address]  Fax: [phone]   Heide Scales. Yolanda Bonine, M.D.

## 2011-02-08 NOTE — H&P (Signed)
[name], [name]              ACCOUNT NO.:  [id number]   MEDICAL RECORD NO.:  [id number]          PATIENT TYPE:  INP   LOCATION:  0270                         FACILITY:  [facility]   PHYSICIAN:  Pierce Crane, M.D.      DATE OF BIRTH:  October 13, 1945   DATE OF ADMISSION:  11/30/2004  DATE OF DISCHARGE:                                HISTORY & PHYSICAL   [name] is a 65 year old woman admitted for febrile neutropenia.   HISTORY OF PRESENT ILLNESS:  This woman has a history of type 2 diabetes.  She essentially was diagnosed with a T1C, N1 breast cancer, ER/PR negative,  HER-2 1+ in December, 2005.  She has undergone chemotherapy with PAC.  She  is now on day 7 of cycle 2 of PAC and presented with feeling diffuse  malaise, fatigue, fever to 101.3, joint pain, and bone pain.   PAST MEDICAL HISTORY:  Significant for history of type 2 diabetes.  She is  on Actos and Amaryl.  She has been followed by Dr. Criss Alvine.  She has not  had any other significant medical problems.  She has a history of  hysterectomy with oophorectomy and hemorrhoidectomy.   FAMILY HISTORY:  Negative for breast cancer.  She has two children, age 62  and 66.   She has no known allergies.   REVIEW OF SYSTEMS:  She is not having any frank fevers or chills.  No  headaches or blurry vision.  She is not complaining of any shortness of  breath or cough.  She denies any dysuria.  She feels unwell and is having  some bone pain, as noted.   PHYSICAL EXAMINATION:  VITAL SIGNS:  Blood pressure 150/59, temperature  101.3, heart rate 146, respiratory rate 20.  Weight 214.  GENERAL:  She is alert and oriented.  HEENT/NECK:  Unremarkable.  No thrush.  LUNGS:  Clear.  No rales or rhonchi.  HEART:  Heart sounds normal.  CHEST:  Port site looks normal.  ABDOMEN:  She has no palpable hepatosplenomegaly.  No inguinal adenopathy.  EXTREMITIES:  No peripheral clubbing, cyanosis or edema.   CBC today:  White count 2.1, ANC 100,  hemoglobin 13.4, platelet count 158.  Chemistries:  Creatinine of 1.  Glucose elevated at 308.  The remainder of  the labs within normal limits.   IMPRESSION/PLAN:  Ms. Shivley will be admitted to the hospital with absolute  neutropenia and low-grade fevers.  She will be cultured and given broad-  spectrum IV antibiotics.      PR/MEDQ  D:  11/30/2004  T:  11/30/2004  Job:  [id number]   cc:   Currie Paris, M.D.  [address]  [place]  [place] [address]   Almedia Balls. Fore, M.D.  [phone] [address] [place]  [place] [address]  Fax: [phone]

## 2011-02-08 NOTE — Op Note (Signed)
NAMESAMIE, BARCLIFT              ACCOUNT NO.:  [id number]   MEDICAL RECORD NO.:  [id number]          PATIENT TYPE:  AMB   LOCATION:  NESC                         FACILITY:  [facility]   PHYSICIAN:  Currie Paris, M.D.DATE OF BIRTH:  05/08/1946   DATE OF PROCEDURE:  10/25/2004  DATE OF DISCHARGE:                                 OPERATIVE REPORT   [facility] [phone].   PREOPERATIVE DIAGNOSES:  1.  Carcinoma, left breast, upper outer quadrant.  2.  Inadequate venous access for chemotherapy   POSTOPERATIVE DIAGNOSES:  1.  Carcinoma, left breast, upper outer quadrant.  2.  Inadequate venous access for chemotherapy   OPERATION:  1.  Left axillary dissection.  2.  Placement of Port-A-Cath (right subclavian)/.   SURGEON:  Currie Paris, M.D.   ASSISTANT:  Leonie Man, M.D.  Also scrubbed, [employer]. student.   ANESTHESIA:  General (LMA).   CLINICAL HISTORY:  This patient is a 58-year lady who recently presented  with a breast cancer and had a lumpectomy followed by a sentinel node.  Unfortunately, the sentinel node came back after the time of surgery with a  positive node, and it was recommended that she proceed to node dissection  with the plans for Port-A-Cath for chemo as well.   DESCRIPTION OF PROCEDURE:  The patient was seen in the holding area and had  no further questions. The left side was marked as the operative side for the  axillary dissection.   She was taken to the operating room.  After satisfactory general anesthesia  obtained, the left breast and axillary areas were prepped and draped as a  single sterile field.  Time-out occurred.  The old axillary incision was  reopened and enlarged a little bit by going to little bit more anteriorly  and posteriorly and the subcutaneous tissues divided down to the chest wall.  I Identified the latissimus posteriorly and eventually then identified the  pectoralis edge anteriorly and then began coming underneath  this tissue, and  there was a lot of postoperative changes here with what looked like some fat  necrosis.  So I came along the edge of the pectoralis.  We were able to  enter deep to these inflammatory changes from the surgery into a more  untouched area of the axilla and with fairly normal-looking fatty tissue and  with some retractors in place, dissection was able to identify the chest  wall in the axillary vein.  The axillary contents were then stripped from  medial to lateral and superior to inferior.  The long thoracic and  thoracodorsal nerves were identified and preserved.  The  second intercostal  nerve was taken, divided with some clips.   The lateral attachments to the latissimus were then divided and the  remaining attachments more superficially likewise.  This completed the  axillary dissection.   The area was irrigated and checked for hemostasis.  We used ties, cautery  and clips as indicated and needed.   Once everything appeared to be dry,  I placed some Marcaine around the long  thoracic and thoracodorsal nerves and little  bit into the area of the nerve  supply to the pectoralis.  I placed a 19 Blake drain and secured it with a 3-  0 nylon.   The wound was irrigated a final time and again checked for hemostasis, and  again everything appeared to be dry, so was closed in layers with 3-0 Vicryl  and staples.   The patient was repositioned with her arms at her side, reprepped and  draped, and all used for the Port-A-Cath placement.  With the patient and  some Trendelenburg, the right axillary vein was accessed on initial attempt  and guidewire threaded easily.  Fluoro showed it to be in the area of the  right atrium.   The transverse incision was made over the chest wall and a pocket fashioned  for the reservoir using cautery.  I injected some Marcaine help with postop  analgesia.   The Port-A-Cath tubing was brought from the Port-A-Cath reservoir site into  the  guidewire site.  With the patient in Trendelenburg, the guidewire site  was dilated once with the dilator and peel-away sheath.  The dilator and  guidewire were removed and the catheter threaded to 20 cm and the peel-away  sheath removed.  The catheter aspirated and irrigated easily.   Using fluoro, I backed this up to 16 cm, where we appeared to be just above  the right atrium.  It aspirated and irrigated easily.  It was attached to a  pre-flushed reservoir and the locking mechanism engaged.  The reservoir  aspirated and irrigated easily.   The reservoir was sutured to the deeper fascia with some 3-0 Vicryl and  placed in its pocket.  It aspirated easily.  It was flushed with dilute  heparin and then flushed with concentrated heparin.  Fluoro showed good  positioning of the tip, good positioning of the reservoir with no evidence  of any kinks.   Incision was closed in layers with 3-0 Vicryl, 4-0 Monocryl subcuticular and  Dermabond.   The patient tolerated the procedure well.  There were no intraoperative  complications.  All counts were correct.  Estimated blood loss was 100-150  mL.      CJS/MEDQ  D:  10/25/2004  T:  10/25/2004  Job:  [id number]   cc:   Almedia Balls. Fore, M.D.  [phone] [address] [place]  [place] [address]  Fax: [phone]   Jasmine Pang  [address]  [place]  [place] [address]  Fax: [phone]   Dorisann Frames, M.D.  [phone] [address] [place] [address]  Fax: [phone]   Pierce Crane, M.D.  [address] [facility]  [place]  [place] [address]  Fax: [phone]

## 2011-02-08 NOTE — H&P (Signed)
NAMECHRISTYN, [name]              ACCOUNT NO.:  [id number]   MEDICAL RECORD NO.:  [id number]          PATIENT TYPE:  INP   LOCATION:  0254                         FACILITY:  [facility]   PHYSICIAN:  Lennis P. Darrold Span, M.D.DATE OF BIRTH:  1946/08/23   DATE OF ADMISSION:  02/22/2005  DATE OF DISCHARGE:                                HISTORY & PHYSICAL   Patient is a 65 year old lady followed by Dr. Pierce Crane for T1, N1 left  breast carcinoma, for which she is receiving adjuvant TAC chemotherapy,  admitted on day #8, cycle 5, with a temperature of 101 degrees at home.  Absolute neutrophil count at our office today of 0.1.  She had received  Neulasta on day #2 (Feb 16, 2005 of this cycle of chemotherapy).  A full CBC  at the office on the day of admission:  White count 1.4, ANC 0.1, hemoglobin  10.4, platelets 121.   The patient was diagnosed in late December, 2005 after an abnormal area was  found on routine mammogram.  She had a lumpectomy with a single sentinel  node on October 10, 2004, then an additional 12 axillary nodes.  The tumor  was 1.5 cm, grade 3 invasive ductal ER/PR negative, HER-2 1+, sentinel node  positive, and one additional node with micrometastatic disease.  She has a  Port-A-Cath in place.  She was admitted also on day #7 of cycle 2 with a  neutropenic fever with recovery of count by day #10 and apparently no source  for the fever identified at that admission.   Patient has had some cough for the last 3-4 weeks, slightly productive of  bloody sputum.  She has had some chest tightness centrally with the cough.  She has been very fatigued this week, as she has been with each cycle of  chemotherapy.  She did not check her temperature until today but has not had  shaking chills.  She has taken some p.o., including fluids today.   REVIEW OF SYSTEMS:  Otherwise, no sore mouth or throat.  No nausea.  No  constipation or diarrhea.  No bladder symptoms.  No other bleeding.   She has  had a loose lower tooth for the past week or so, and for the past couple of  days, some pain in the upper and lower jaw on the right.   PAST MEDICAL HISTORY:  No known allergies.  She does have diabetes and is on  oral agent.  She is post hysterectomy, oophorectomy, gravida 2, para 2.   FAMILY HISTORY:  Father with prostate cancer.  Mother died with Alzheimer's.  Two brothers alive and well.  One brother died with HIV.  Two children, ages  76 and 72.   SOCIAL HISTORY:  She has been married x35 years.  Works in the [employer] system.   PHYSICAL EXAMINATION:  VITAL SIGNS:  Temperature 99.8, heart rate 111,  respirations 22 on room air, 100% saturated.  Blood pressure 144/60.  GENERAL:  On exam, she is a pleasant lady, not in acute distress, lying in  bed.  A good historian.  Husband present and very supportive.  HEENT:  Alopecia.  Pupils are equal and reactive.  Not icteric.  Oral mucosa  clear and moist.  LUNGS:  No wheezes, rales, or rhonchi.  Clear to percussion.  HEART:  Regular rate and rhythm.  Port is nontender and without erythema.  ABDOMEN:  Obese, soft, flat, and nontender.  Some slight puffiness in her  left hand, compared with the right.  EXTREMITIES:  No pitting edema or cords.  NEUROLOGIC:  Nonfocal.   CBC from June 2, as above.  Labs all pending.  Will repeat CBC with diff in  the a.m.   EKG:  Pending.   CHEST X-RAY:  Preliminary.  Stable.  No acute disease.   IMPRESSION/PLAN:  1.  Febrile neutropenia:  Patient in day #8, cycle 5 of TAC adjuvant      chemotherapy for breast cancer.  Did receive Neulasta on day #2.  She      has a cough without chest x-ray findings and a loose lower right tooth      with some pain on that side.  Will culture.  Begin Maxipime plus oral      clindamycin to give additional dental coverage.  I have not put her n      additional Neupogen since she did have the Neulasta, and she is      hemodynamically stable.  2.   Diabetes:  Will continue her home meds and follow CBGs.  3.  T1, N1 left breast carcinoma, post lumpectomy and axillary node      dissection.  Slight puffiness in the left hand, maybe some early      lymphedema.  4.  Port-A-Cath:  On low dose Coumadin with question of blood in her sputum.      Will also check the PT.       LPL/MEDQ  D:  02/22/2005  T:  02/22/2005  Job:  [id number]   cc:   Pierce Crane, M.D.  [address] [facility]  [place]  [place] [address]  Fax: [phone]   Currie Paris, M.D.  [address]  [place]  [place] [address]   Artist Pais. Kathrynn Running, M.D.  [address] [facility]  [place]  [place] [address]  Fax: [phone]

## 2011-02-08 NOTE — Procedures (Signed)
[facility]  Patient:    [name], [name] Visit Number: [id number] MRN: [id number]          Service Type: END Location: ENDO Attending Physician:  Dennison Bulla Ii Dictated by:   Verlin Grills, M.D. Proc. Date: 03/12/02 Admit Date:  03/12/2002 Discharge Date: 03/12/2002                             Procedure Report  REFERRING PHYSICIAN:  Thora Lance, M.D.  PROCEDURE:  Screening colonoscopy.  PROCEDURE INDICATION:  [name] is a 65 year old female born October 19, 1945.  [name] is referred for her first screening colonoscopy with polypectomy to prevent colon cancer.  I discussed with her the complications associated with colonoscopy and polypectomy, including a 15 per thousand risk of bleeding and four per thousand risk of colon perforation requiring surgical repair.  [name] has signed the operative permit.  ENDOSCOPIST:  Verlin Grills, M.D.  PREMEDICATION:  Versed 5 mg, fentanyl 50 mcg.  ENDOSCOPE:  Olympus pediatric colonoscope.  DESCRIPTION OF PROCEDURE:  After obtaining informed consent, [name] was placed in the left lateral decubitus position.  I administered intravenous fentanyl and intravenous Versed to achieve conscious sedation for the above procedure.  The peptonates blood pressure, oxygen saturation, and cardiac rhythm were monitored throughout the procedure and documented in the medical record.  Anal inspection was normal.  Digital rectal exam was normal.  The Olympus pediatric video colonoscope was introduced into the rectum and advanced to the cecum.  Colonic preparation for the exam today was excellent.  Rectum normal.  Sigmoid colon and descending colon normal.  Splenic flexure normal.  Transverse colon normal.  Hepatic flexure normal.  Ascending colon normal.  Cecum and ileocecal valve normal.  ASSESSMENT:  Normal screening proctocolonoscopy to the  cecum.  RECOMMENDATIONS:  Yearly stool Hemoccult testing.  If still Hemoccult-positive, repeat colonoscopy. Dictated by:   Verlin Grills, M.D. Attending Physician:  Dennison Bulla Ii DD:  03/12/02 TD:  03/14/02 Job: [id number]

## 2011-02-08 NOTE — Op Note (Signed)
NAME:  [name], [name]                        ACCOUNT NO.:  [id number]   MEDICAL RECORD NO.:  [id number]                   PATIENT TYPE:  AMB   LOCATION:  DSC                                  FACILITY:  [facility]   PHYSICIAN:  Harvie Junior, M.D.                DATE OF BIRTH:  Jan 25, 1946   DATE OF PROCEDURE:  03/02/2003  DATE OF DISCHARGE:                                 OPERATIVE REPORT   PREOPERATIVE DIAGNOSIS:  Medial side pain with a suspected medial meniscal  tear.   POSTOPERATIVE DIAGNOSIS:  1. Chondromalacia medial femoral condyle.  2. 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  3. Lateral meniscal tear.   PROCEDURE:  1. Partial lateral meniscectomy.  2. Debridement of medial femoral condyle.  3. Debridement of patellofemoral compartment and specifically the femoral     trochlea.   SURGEON:  Harvie Junior, M.D.   ASSISTANT:  Marshia Ly, P.A.   ANESTHESIA:  General.   INDICATIONS FOR PROCEDURE:  The patient is a 65 year old female on  orthopedic surgery service with a long complaint of right knee pain.  She  ultimately had left knee pain previously and had a left knee arthroscopy  which had done well for her and because of continued complaints of left knee  pain with failure of conservative care, she was ultimately taken to the  operating room for operative knee arthroscopy.   DESCRIPTION OF PROCEDURE:  The patient was taken to the operating room and  after adequate anesthesia was obtained with general endotracheal, the  patient was placed supine on the operating table. The right leg was prepped  and draped in the usual sterile fashion.  Following this, routine  arthroscopic examination of the knee revealed that there was obvious  chondromalacia of the medial femoral condyle. This was debrided back to a  smooth and stable rim. The medial meniscus was then probed. There was some  loosening of the meniscus posteriorly that could be pulled  under the knee.  Given the significant nature of chondromalacia of the medial femoral  condyle, it was felt that this was best left alone. Attention was turned to  the anterior cruciate which was normal. Attention was turned to the lateral  side where there was noted to be an outer edge lateral meniscal tear which  was debrided.  The lateral femoral condyles were without significant  abnormality. Attention was turned to the patellofemoral joint where there  was noted to be fairly significant chondromalacia of the trochlea. This was  debrided back to a smooth and stable rim with a suction shaver. Following  this, attention was turned to the medial side again where a final check was  made  for any loose or fragmented pieces. Seeing none, the knee was copiously  irrigated and suctioned dry. The portals were closed with bandage. A sterile  compressive dressing was applied. The patient was taken to  the recovery room  where she was noted to be in satisfactory condition.  Estimated blood loss  for the procedure was none.                                               Harvie Junior, M.D.    Ranae Plumber  D:  03/02/2003  T:  03/02/2003  Job:  [id number]

## 2011-02-08 NOTE — Discharge Summary (Signed)
NAMEJOZEE, HAMMER              ACCOUNT NO.:  [id number]   MEDICAL RECORD NO.:  [id number]          PATIENT TYPE:  INP   LOCATION:  0254                         FACILITY:  [facility]   PHYSICIAN:  Pierce Crane, M.D.      DATE OF BIRTH:  October 21, 1945   DATE OF ADMISSION:  02/22/2005  DATE OF DISCHARGE:                                 DISCHARGE SUMMARY   DISCHARGE DIAGNOSES:  1.  Breast cancer.  2.  Febrile neutropenia.   Ms. Furber is a pleasant 65 year old woman with a known history of node  positive breast cancer admitted after receiving cycle #5 TAC chemotherapy  with low-grade fever. On initial evaluation, temperature 99.8, pulse was  111, respiratory rate 22, blood pressure was 144/60. No peripheral  adenopathy. Lungs clear, heart sounds normal. Port site looked normal. No  palpable hepatosplenomegaly, no inguinal adenopathy.   COURSE IN THE HOSPITAL:  The patient was admitted to the hospital and blood  cultures were obtained. Initial labs showed a white count of 1.4, ANC of  1.1. She was placed on cefepime empirically. She had a chest x-ray which was  negative. Blood cultures were negative. She improved in the hospital. Her  potassium was low and she was placed on oral potassium. She was started on  Xanax for anxiety. She was complaining of some chest discomfort which  traditionally had been associated with Neulasta injections and thought that  it represented myeloid recovery. She had no other complications while in  hospital. Lab work on February 25, 2005 showed a white count of 9.6, hemoglobin  of 10, platelet count of 152, potassium 3.9, creatinine 0.8. Blood sugar  remained somewhat elevated. She did require p.r.n. insulin on a sliding  scale. She was continued on her Actos and Amaryl. She on February 25, 2005 was  felt to be stable for discharge.   She was discharged on the following medications:  1.  Actos and Amaryl at home doses.  2.  Coumadin 1 mg a day.  3.  K-Dur one tablet  daily.  4.  Xanax 0.25 mg p.o. q.6h. p.r.n.   She was instructed to return to the clinic at the end of the week to be seen  by one of the mid-level practitioners. I have asked her to record her blood  sugars twice a day and to follow up with her endocrinologist regarding her  blood sugar results. She was also given instructions regarding her diabetic  diet control. She is due to return to the clinic for cycle #6 on the March 07, 2005. The patient discharged in stable condition from hospital.   PROGNOSIS:  Good.      PR/MEDQ  D:  02/25/2005  T:  02/25/2005  Job:  [id number]

## 2011-02-08 NOTE — Discharge Summary (Signed)
[name], [name]              ACCOUNT NO.:  [id number]   MEDICAL RECORD NO.:  [id number]          PATIENT TYPE:  INP   LOCATION:  0270                         FACILITY:  [facility]   PHYSICIAN:  Pierce Crane, M.D.      DATE OF BIRTH:  03-Feb-1946   DATE OF ADMISSION:  11/30/2004  DATE OF DISCHARGE:                                 DISCHARGE SUMMARY   DISCHARGE DIAGNOSES:  1.  Febrile neutropenia.  2.  Multi-node-positive breast cancer.  3.  Type 2 diabetes.   [name] is a 65 year old woman with known history of node-positive breast  cancer status post TAC chemotherapy. She was admitted to the hospital with  neutropenia and fever. Total white count was 2.1 with an ANC of 0.1. She had  no other complaints at the time.   On admission to the hospital she was cultured both peripherally and  centrally. Chest x-ray was also done which did not show any active disease.  She was started on Fortaz 2 g IV q.6h. She tolerated this well. Counts  improved in hospital. She did have an episode of atypical chest pain that  was thought to be anxiety related and resolved with Ativan. She had no  complications in hospital. Blood sugars remained elevated, initially placed  on sliding scale insulin and eventually transitioned to her oral  hypoglycemic agents. She had some oral ulcerations that were treated with  Magic Mouthwash. She was felt to be stable for discharge on December 03, 2004.  On that day total white count was 10.4, platelet count of 152, potassium of  3.9, blood sugar that morning was 165. She was discharged on her home  medications including Actos and Amaryl, Coumadin a milligram a day. She has  other prechemotherapy medications as well. She was given a prescription for  Magic Mouthwash to be taken as well. She was asked to come back to the  office on March 17 to be seen for recheck of her blood counts. Next cycle of  chemotherapy is scheduled for March 24. The patient is discharged in  stable  condition from hospital.   PROGNOSIS:  Good.   CONSULTATIONS IN THE HOSPITAL:  None.   PROCEDURES IN THE HOSPITAL:  None.     PR/MEDQ  D:  12/03/2004  T:  12/03/2004  Job:  [id number]   cc:   Currie Paris, M.D.  [address]  [place]  [place] [address]

## 2011-04-16 ENCOUNTER — Ambulatory Visit: Payer: Medicare Other | Admitting: Dietician

## 2011-04-16 ENCOUNTER — Encounter: Payer: Medicare Other | Attending: Endocrinology | Admitting: *Deleted

## 2011-04-16 DIAGNOSIS — Z713 Dietary counseling and surveillance: Secondary | ICD-10-CM | POA: Insufficient documentation

## 2011-04-16 DIAGNOSIS — E119 Type 2 diabetes mellitus without complications: Secondary | ICD-10-CM | POA: Insufficient documentation

## 2011-04-16 NOTE — Progress Notes (Signed)
Patient was seen on 04/16/2011 for her first visit at [facility] for Blood Glucose Monitoring instruction. Pt was instructed to call her physician for prescription for strips/lancets for her meter and instruction on MD recommended testing times.  Meter Education completed and meter provided in [place]. Meter: [employer] (SmartView/FastClix) Lot #: [id number] Expiration Date: 08/22/2012  Strips: Accu-Check Smart View Lot #: [id number] Expiration Date: 07/23/2012  Patient is scheduled to follow-up at [facility] for Diabetes Diet Education on 05/02/11.

## 2011-05-02 ENCOUNTER — Encounter: Payer: Self-pay | Admitting: *Deleted

## 2011-05-02 ENCOUNTER — Encounter: Payer: Medicare Other | Attending: Endocrinology | Admitting: *Deleted

## 2011-05-02 DIAGNOSIS — Z713 Dietary counseling and surveillance: Secondary | ICD-10-CM | POA: Insufficient documentation

## 2011-05-02 DIAGNOSIS — E119 Type 2 diabetes mellitus without complications: Secondary | ICD-10-CM | POA: Insufficient documentation

## 2011-05-02 NOTE — Progress Notes (Signed)
Medical Nutrition Therapy:  Appt start time: 1115 end time:  1200.  MEDICATIONS: [place], Onglyza (for DM2)  Assessment:  Primary concerns today: Blood sugar control. Pt does not bring glucose log to her visit today because she "just recently had a cortisone shot" and she knows her glucose levels are elevated. She reports sporatic meal pattern and regular intake of sugar-sweetened beverages.  Usual eating pattern includes 2 meals and 2 snacks per day.  B (8-9): Bacon on a bagel OR Atkin's protein shake S (10-12): Chips OR Fruit OR No snack L (12-2): No lunch typically OR side salad S: No snack D (4:30-6:30): Grilled meat (occasional fried meat), baked potato, 2 vegetables S: Fresh fruit Beverages: Water, Lemonade w/ sugar, Pepsi (1-3/day), Iced tea with sugar  Usual physical activity: Mild walking (1-3 times/week), 10-15 minutes  BGM: Checking 1-2 times/day Avg CBG: FBS=140-200's   No results found for this basename: HGBA1C   Estimated energy needs: 1500-1600 calories <160-175 g carbohydrates 80-85 g protein 50-55 g fat  Progress Towards Goal(s):  In progress.   Nutritional Diagnosis:  [place]-2.1 Inpaired nutrition utilization As related to type 2 DM and frequent meal skipping.  As evidenced by elevated glucose levels >150 (fasting).    Intervention:    Avoid sugar sweetened beverages  Eat 3 meals and 2 snacks, every 3-5 hrs  Limit carbohydrate intake to 30-45 grams carbohydrate/meal  Limit carbohydrate intake to 15 grams carbohydrate/snack  Add lean protein foods to meals/snacks  Monitor glucose levels as instructed by your doctor  Aim for 30 mins of physical activity daily  Bring food record and glucose log to your next nutrition visit  Monitoring/Evaluation:  Dietary intake, exercise, glucose levels, and body weight in 3 month(s).

## 2011-05-02 NOTE — Patient Instructions (Addendum)
Goals:  Avoid sugar sweetened beverages  Eat 3 meals and 2 snacks, every 3-5 hrs  Limit carbohydrate intake to 30-45 grams carbohydrate/meal  Limit carbohydrate intake to 15 grams carbohydrate/snack  Add lean protein foods to meals/snacks  Monitor glucose levels as instructed by your doctor  Aim for 30 mins of physical activity daily  Bring food record and glucose log to your next nutrition visit

## 2011-05-30 ENCOUNTER — Encounter: Payer: Medicare Other | Attending: Endocrinology | Admitting: *Deleted

## 2011-05-30 ENCOUNTER — Encounter: Payer: Self-pay | Admitting: *Deleted

## 2011-05-30 DIAGNOSIS — Z713 Dietary counseling and surveillance: Secondary | ICD-10-CM | POA: Insufficient documentation

## 2011-05-30 DIAGNOSIS — E119 Type 2 diabetes mellitus without complications: Secondary | ICD-10-CM | POA: Insufficient documentation

## 2011-05-30 NOTE — Patient Instructions (Addendum)
Goals:  Follow Diabetes Meal Plan as instructed  Plate Method Meal Planner  Eat 3 meals and 2 snacks, every 3-5 hrs  Aim for more strict carbohydrate counting/label reading  Limit carbohydrate intake to 30-45 grams carbohydrate/meal  Limit carbohydrate intake to 15 grams carbohydrate/snack  Add lean protein foods to meals/snacks  Monitor glucose levels daily  Aim for 30-45 mins of physical activity daily  Bring food record and glucose log to your next nutrition visit

## 2011-05-30 NOTE — Progress Notes (Signed)
  Medical Nutrition Therapy:  Appt start time: 1610 end time:  1640.  Assessment:  Primary concerns today: diabetes management. Pt is to see Dr. Talmage Nap next month and reports concerns today over elevated glucose readings despite trying to monitor carbohydrate portions and taking medications regularly. She is limited on physical activity levels and notes recent stress in her life.  MEDICATIONS: Pt continues on Onglyza and Amaryl  DIETARY INTAKE:  24-hr recall:  B (7-9 AM): Boiled egg w/ bacon OR  2 pkg oatmeal OR Bagel thin sandwich w/ egg Snk (10:30 AM) :1 pc fresh fruit  L (12-1 PM): Salad w/ deli meat, light dressing  Snk (3 PM): Laughing Cow Cheese w/ 6 wheat thins D (6-8 PM): 1 small baked potato, 1 cup creamed spinach, 4 oz. Baked chicken Snk (9-11 PM): peanut butter crackers (4) Beverages: Pepsi, water,  Recent physical activity: Walks 30 minutes, 2 times/week  CBG: Checks daily FBS: 130-200 2 hrs post: 160-220  No results found for this basename: HGBA1C   Estimated energy needs: 1500-1600 calories 160-175 g carbohydrates 85-90 g protein 50-55 g fat  Progress Towards Goal(s):  In progress.   Nutritional Diagnosis:  [place]-2.1 Physical inactivity As related to lack of motivation for exercise.  As evidenced by pt with infrequent and sportatic activity patterns.  Impaired nutrient utilization related to diabetes as evidenced by elevated glucose levels.    Intervention:  Nutrition education and encouragement. See Goals.  Monitoring/Evaluation:  Dietary intake, exercise, glucose levels, and body weight in 2 month(s).

## 2011-07-22 ENCOUNTER — Encounter: Payer: Self-pay | Admitting: *Deleted

## 2011-07-22 ENCOUNTER — Encounter: Payer: Medicare Other | Attending: Endocrinology | Admitting: *Deleted

## 2011-07-22 DIAGNOSIS — Z713 Dietary counseling and surveillance: Secondary | ICD-10-CM | POA: Insufficient documentation

## 2011-07-22 DIAGNOSIS — E119 Type 2 diabetes mellitus without complications: Secondary | ICD-10-CM | POA: Insufficient documentation

## 2011-07-22 NOTE — Patient Instructions (Signed)
Goals:  Follow Diabetes Meal Plan as instructed  Eat 3 meals and 2 snacks, every 3-5 hrs  Continue to follow Plate Method for Carbohydrate Control Choose 1-2 carbohydrate servings/meal, 1 carbohydrate/snack Add lean protein foods to meals/snacks  Aim for 30-45 mins of physical activity daily

## 2011-07-22 NOTE — Progress Notes (Signed)
  Medical Nutrition Therapy: Appt start time: 1600 end time: 1635   Assessment: Primary concerns today: diabetes management. [name] reports that she is continuing to follow her diabetes "plate plan method" for controlling her carbohydrate intake. She attempts to eat only 1-2 carbohydrates at meal times. She avoids most sweets and junk foods in her home. She aims to buy products that are low carbohydrate, fat, and sodium, when looking at the food label. She is not carbohydrate counting at this time and does not feel that she would be successful with this at this time. Pt reports that she feels her glucose levels are elevated, not due to her diet, but due to increased stress levels and poor sleeping patterns.  MEDICATIONS: Pt continues on Onglyza and Amaryl, Amaryl increased to 4mg   DIETARY INTAKE:   24-hr recall:  "My eating has not changed by much"  B (7-9 AM): Boiled egg w/ bacon OR Atkin's Protein Drinks Snk (10:30 AM): 1 pc fresh fruit  L (12-1 PM): Salad w/ deli meat, light dressing, 1 piece of fruit Snk (3 PM): Laughing Cow Cheese w/ 6 wheat thins  D (6-8 PM): bowl of oatmeal (1 cup) Snk (9-11 PM): peanut butter crackers (4)   Beverages: water = 32-40 oz  Recent physical activity: Walks 30 minutes, 3-4 times/week   CBG: Checks daily  FBS: 170-190's 2 hrs post: 150-190's Last A1c = >7% per pt (she believes it was 9%)  No results found for this basename: HGBA1C   Estimated energy needs:  1500-1600 calories  160-175 g carbohydrates  85-90 g protein  50-55 g fat   Progress Towards Goal(s): In progress.   Nutritional Diagnosis:  Impaired nutrient utilization related to diabetes as evidenced by elevated glucose levels and HgbA1c.   Intervention: Nutrition education and encouragement. See Goals.  Monitoring/Evaluation: Dietary intake, exercise, glucose levels, and body weight in 2 month(s).

## 2011-09-14 ENCOUNTER — Telehealth: Payer: Self-pay | Admitting: Oncology

## 2011-09-14 NOTE — Telephone Encounter (Signed)
Called pt, left message appt was r/s to end of January due to Epic.

## 2011-09-25 ENCOUNTER — Ambulatory Visit: Payer: Medicare Other | Admitting: *Deleted

## 2011-10-03 ENCOUNTER — Other Ambulatory Visit: Payer: Medicare Other

## 2011-10-16 ENCOUNTER — Other Ambulatory Visit: Payer: Medicare Other | Admitting: Lab

## 2011-10-22 ENCOUNTER — Ambulatory Visit: Payer: Medicare Other | Admitting: Oncology

## 2011-10-29 ENCOUNTER — Other Ambulatory Visit: Payer: Self-pay

## 2011-10-29 DIAGNOSIS — E559 Vitamin D deficiency, unspecified: Secondary | ICD-10-CM

## 2011-10-29 DIAGNOSIS — Z853 Personal history of malignant neoplasm of breast: Secondary | ICD-10-CM

## 2011-10-30 ENCOUNTER — Other Ambulatory Visit: Payer: Medicare Other | Admitting: Lab

## 2011-10-31 ENCOUNTER — Ambulatory Visit: Payer: Medicare Other

## 2011-10-31 DIAGNOSIS — Z853 Personal history of malignant neoplasm of breast: Secondary | ICD-10-CM

## 2011-10-31 DIAGNOSIS — E559 Vitamin D deficiency, unspecified: Secondary | ICD-10-CM

## 2011-11-06 ENCOUNTER — Ambulatory Visit (HOSPITAL_BASED_OUTPATIENT_CLINIC_OR_DEPARTMENT_OTHER): Payer: Medicare Other | Admitting: Oncology

## 2011-11-06 VITALS — BP 132/75 | HR 94 | Temp 98.3°F | Ht 60.0 in | Wt 223.2 lb

## 2011-11-06 DIAGNOSIS — Z853 Personal history of malignant neoplasm of breast: Secondary | ICD-10-CM

## 2011-11-07 ENCOUNTER — Telehealth: Payer: Self-pay | Admitting: Oncology

## 2011-11-07 NOTE — Telephone Encounter (Signed)
S/w the pt and she is aware of her feb 2014 appt calendar 

## 2012-01-27 ENCOUNTER — Other Ambulatory Visit: Payer: Self-pay | Admitting: *Deleted

## 2012-01-27 MED ORDER — GABAPENTIN 100 MG PO CAPS: 100.0000 mg | ORAL_CAPSULE | Freq: Three times a day (TID) | ORAL | Status: DC

## 2012-10-22 ENCOUNTER — Telehealth: Payer: Self-pay | Admitting: *Deleted

## 2012-10-22 NOTE — Progress Notes (Signed)
ID: KAMEAH RAWL  DOB: 10-06-1945  MR#: [id number]  CSN#: [id number] DOS: 11/06/11  PRINCIPAL DIAGNOSIS:  History of node positive left breast cancer, status post lumpectomy, sentinel lymph node dissection in January 2006 followed by six cycles of TAC chemotherapy.  Radiation therapy completed in September 2006 with triple negative disease.  Currently on observation.  History of type 2 diabetes  Interval History:  She is doing fairly well, with no intercurrent illness. DM is under fairly good control.  ROS:  14 point ROS is negative  No Known Allergies  Current Outpatient Prescriptions  Medication Sig Dispense Refill  . aspirin 81 MG tablet Take 81 mg by mouth daily.      Marland Kitchen atorvastatin (LIPITOR) 10 MG tablet Take 10 mg by mouth daily.        . ergocalciferol (VITAMIN D2) 50000 UNITS capsule Take 50,000 Units by mouth once a week.        Marland Kitchen glimepiride (AMARYL) 2 MG tablet Take 4 mg by mouth 2 (two) times daily.       . Multiple Vitamins-Minerals (MULTIVITAMIN WITH MINERALS) tablet Take 1 tablet by mouth daily.        . saxagliptin HCl (ONGLYZA) 2.5 MG TABS tablet Take 2.5 mg by mouth daily.        Marland Kitchen gabapentin (NEURONTIN) 100 MG capsule Take 1 capsule (100 mg total) by mouth 3 (three) times daily.  270 capsule  4     Objective:  Filed Vitals:   11/06/11 1651  BP: 132/75  Pulse: 94  Temp: 98.3 F (36.8 C)    BMI: Body mass index is 43.59 kg/(m^2).   ECOG FS:  Physical Exam:   Sclerae unicteric  Oropharynx clear  No peripheral adenopathy  Lungs clear -- no rales or rhonchi  Heart regular rate and rhythm  Abdomen benign  MSK no focal spinal tenderness, no peripheral edema  Neuro nonfocal  Breast exam: clinically NED, both axillae are negative  Lab Results:      Chemistry      Component Value Date/Time   NA 138 10/08/2010 1332   K 4.0 10/08/2010 1332   CL 102 10/08/2010 1332   CO2 25 10/08/2010 1332   BUN 15 10/08/2010 1332   CREATININE 0.89 10/08/2010 1332       Component Value Date/Time   CALCIUM 9.5 10/08/2010 1332   ALKPHOS 71 10/08/2010 1332   AST 16 10/08/2010 1332   ALT 13 10/08/2010 1332   BILITOT 0.3 10/08/2010 1332       Lab Results  Component Value Date   WBC 8.3 10/08/2010   HGB 12.1 10/08/2010   HCT 36.1 10/08/2010   MCV 85.3 10/08/2010   PLT 249 10/08/2010   NEUTROABS 5.5 10/08/2010    Studies/Results:  No results found.  Assessment: 67 yo woman , hx of node + breast cancer, s/p lumprectomy, TAC x6, xrt. She isin 79th y of f/u.     Plan: she appears to be doing fairly well, I will see her on annual basis.        Eiden Bagot 10/22/2012

## 2012-10-22 NOTE — Telephone Encounter (Signed)
Confirm new f/u appt for 11/11/12 at 4:00 with Dr.Khan.

## 2012-11-03 ENCOUNTER — Other Ambulatory Visit (HOSPITAL_BASED_OUTPATIENT_CLINIC_OR_DEPARTMENT_OTHER): Payer: Medicare Other | Admitting: Lab

## 2012-11-03 ENCOUNTER — Other Ambulatory Visit: Payer: Medicare Other | Admitting: Lab

## 2012-11-03 DIAGNOSIS — Z853 Personal history of malignant neoplasm of breast: Secondary | ICD-10-CM

## 2012-11-03 LAB — COMPREHENSIVE METABOLIC PANEL (CC13)
ALT: 12 U/L (ref 0–55)
AST: 12 U/L (ref 5–34)
Albumin: 3.3 g/dL — ABNORMAL LOW (ref 3.5–5.0)
Alkaline Phosphatase: 108 U/L (ref 40–150)
BUN: 12.9 mg/dL (ref 7.0–26.0)
CO2: 25 mEq/L (ref 22–29)
Calcium: 9.5 mg/dL (ref 8.4–10.4)
Chloride: 104 mEq/L (ref 98–107)
Creatinine: 0.7 mg/dL (ref 0.6–1.1)
Glucose: 119 mg/dl — ABNORMAL HIGH (ref 70–99)
Potassium: 3.6 mEq/L (ref 3.5–5.1)
Sodium: 137 mEq/L (ref 136–145)
Total Bilirubin: 0.36 mg/dL (ref 0.20–1.20)
Total Protein: 7 g/dL (ref 6.4–8.3)

## 2012-11-03 LAB — CBC WITH DIFFERENTIAL/PLATELET
BASO%: 0.7 % (ref 0.0–2.0)
Basophils Absolute: 0.1 10*3/uL (ref 0.0–0.1)
EOS%: 2 % (ref 0.0–7.0)
Eosinophils Absolute: 0.2 10*3/uL (ref 0.0–0.5)
HCT: 34.9 % (ref 34.8–46.6)
HGB: 11.4 g/dL — ABNORMAL LOW (ref 11.6–15.9)
LYMPH%: 29.3 % (ref 14.0–49.7)
MCH: 27.2 pg (ref 25.1–34.0)
MCHC: 32.7 g/dL (ref 31.5–36.0)
MCV: 83.2 fL (ref 79.5–101.0)
MONO#: 0.8 10*3/uL (ref 0.1–0.9)
MONO%: 6.8 % (ref 0.0–14.0)
NEUT#: 6.9 10*3/uL — ABNORMAL HIGH (ref 1.5–6.5)
NEUT%: 61.2 % (ref 38.4–76.8)
Platelets: 253 10*3/uL (ref 145–400)
RBC: 4.19 10*6/uL (ref 3.70–5.45)
RDW: 13.4 % (ref 11.2–14.5)
WBC: 11.3 10*3/uL — ABNORMAL HIGH (ref 3.9–10.3)
lymph#: 3.3 10*3/uL (ref 0.9–3.3)

## 2012-11-10 ENCOUNTER — Ambulatory Visit: Payer: Medicare Other | Admitting: Oncology

## 2012-11-10 ENCOUNTER — Other Ambulatory Visit: Payer: Medicare Other | Admitting: Lab

## 2012-11-11 ENCOUNTER — Ambulatory Visit (HOSPITAL_BASED_OUTPATIENT_CLINIC_OR_DEPARTMENT_OTHER): Payer: Medicare Other | Admitting: Oncology

## 2012-11-11 ENCOUNTER — Encounter: Payer: Self-pay | Admitting: Oncology

## 2012-11-11 VITALS — BP 120/78 | HR 105 | Temp 98.5°F | Resp 20 | Ht 60.0 in | Wt 221.1 lb

## 2012-11-11 DIAGNOSIS — Z853 Personal history of malignant neoplasm of breast: Secondary | ICD-10-CM

## 2012-11-11 NOTE — Patient Instructions (Addendum)
Doing well  I will see you back in 1 year

## 2012-11-11 NOTE — Progress Notes (Signed)
OFFICE PROGRESS NOTE  CC  Hollice Espy, MD [address] [place] [address] Dr. Cyndia Bent Dr. 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 Diagnosed January 2006.  PRIOR THERAPY:  #1 patient underwent a lumpectomy of the left breast with the final pathology revealing a 1.5 cm invasive ductal carcinoma with one sentinel node positive. She subsequently in [place] 2006 had a left axillary lymph node dissection. 0 of 11 lymph nodes were positive at the axilla dissection therefore her final pathologic staging was T1, 1/12lymph nodes positive for metastatic disease.the tumor was ER negative PR negative HER-2/neu negative.  #2 patient went on to receive adjuvant chemotherapy consisting of Taxotere Adriamycin Cytoxan every 3 weeks for total of 6 cycles.  #3 she then received adjuvant radiation therapy.which she completed in September 2006. Since then she has been on observation only  CURRENT THERAPY:observation  INTERVAL HISTORY: [name] 67 y.o. female returns for followup visit today. Overall she's doing well and is without any complaints. She denies any fevers chills night sweats headaches shortness of breath chest pains palpitations no myalgias and arthralgias no peripheral paresthesias. Remainder of the 10 point review of systems is negative.  MEDICAL HISTORY: Past Medical History  Diagnosis Date  . Hyperlipidemia   . Hypertension   . Diabetes mellitus     ALLERGIES:  has No Known Allergies.  MEDICATIONS:  Current Outpatient Prescriptions  Medication Sig Dispense Refill  . aspirin 81 MG tablet Take 81 mg by mouth daily.      Marland Kitchen atorvastatin (LIPITOR) 10 MG tablet Take 10 mg by mouth daily.        Marland Kitchen gabapentin (NEURONTIN) 100 MG capsule Take 1 capsule (100 mg total) by mouth 3 (three) times daily.  270 capsule  4  . glimepiride (AMARYL) 2 MG tablet Take 4 mg by  mouth 2 (two) times daily.       . Multiple Vitamins-Minerals (MULTIVITAMIN WITH MINERALS) tablet Take 1 tablet by mouth daily.        . saxagliptin HCl (ONGLYZA) 2.5 MG TABS tablet Take 2.5 mg by mouth daily.        . ergocalciferol (VITAMIN D2) 50000 UNITS capsule Take 50,000 Units by mouth once a week.         No current facility-administered medications for this visit.    SURGICAL HISTORY: No past surgical history on file.  REVIEW OF SYSTEMS:  Pertinent items are noted in HPI.   HEALTH MAINTENANCE:   PHYSICAL EXAMINATION: Blood pressure 120/78, pulse 105, temperature 98.5 F (36.9 C), temperature source Oral, resp. rate 20, height 5' (1.524 m), weight 221 lb 1.6 oz (100.29 kg). Body mass index is 43.18 kg/(m^2). ECOG PERFORMANCE STATUS: 0 - Asymptomatic   General appearance: alert, cooperative and appears stated age Resp: clear to auscultation bilaterally Cardio: regular rate and rhythm GI: soft, non-tender; bowel sounds normal; no masses,  no organomegaly Extremities: extremities normal, atraumatic, no cyanosis or edema Neurologic: Grossly normal   LABORATORY DATA: Lab Results  Component Value Date   WBC 11.3* 11/03/2012   HGB 11.4* 11/03/2012   HCT 34.9 11/03/2012   MCV 83.2 11/03/2012   PLT 253 11/03/2012      Chemistry      Component Value Date/Time   NA 137 11/03/2012 1419   NA 138 10/08/2010 1332   K 3.6 11/03/2012 1419   K 4.0 10/08/2010 1332  CL 104 11/03/2012 1419   CL 102 10/08/2010 1332   CO2 25 11/03/2012 1419   CO2 25 10/08/2010 1332   BUN 12.9 11/03/2012 1419   BUN 15 10/08/2010 1332   CREATININE 0.7 11/03/2012 1419   CREATININE 0.89 10/08/2010 1332      Component Value Date/Time   CALCIUM 9.5 11/03/2012 1419   CALCIUM 9.5 10/08/2010 1332   ALKPHOS 108 11/03/2012 1419   ALKPHOS 71 10/08/2010 1332   AST 12 11/03/2012 1419   AST 16 10/08/2010 1332   ALT 12 11/03/2012 1419   ALT 13 10/08/2010 1332   BILITOT 0.36 11/03/2012 1419   BILITOT 0.3 10/08/2010 1332        RADIOGRAPHIC STUDIES:  No results found.  ASSESSMENT: 67 year old female with  #1 remote history of stage II a invasive ductal carcinoma of the left breast status post lumpectomy with axillary lymph node dissection with final pathology revealing a 1.5 cm invasive triple negative breast cancer one of 12 lymph nodes positive for metastatic disease. Patient received adjuvant chemotherapy consisting of TAC with day 2 Neulasta. Overall she tolerated this well. She completed adjuvant radiation therapy in September 2006. She is without any evidence of recurrent disease.   PLAN:   #1 patient is 7 years out since treatment without any complications. She has been seen by Dr. Pierce Crane on a yearly basis.  #2 I will plan on seeing her back in one years time.   All questions were answered. The patient knows to call the clinic with any problems, questions or concerns. We can certainly see the patient much sooner if necessary.  I spent 40 minutes counseling the patient face to face. The total time spent in the appointment was 40 minutes.    Drue Second, MD Medical/Oncology [facility] [phone] (beeper) [phone] (Office)  11/11/2012, 4:58 PM

## 2012-11-14 ENCOUNTER — Telehealth: Payer: Self-pay | Admitting: *Deleted

## 2012-11-14 NOTE — Telephone Encounter (Signed)
Per POF I  Have called and left message for the patient to call the office Monday morning. I have scheduled appts for one year.  JMW

## 2012-11-16 ENCOUNTER — Telehealth: Payer: Self-pay | Admitting: *Deleted

## 2012-11-16 NOTE — Telephone Encounter (Signed)
I have called and left a message for the patient to call the office regarding appts. Need to give appts for one year.  JMW

## 2012-11-19 ENCOUNTER — Telehealth: Payer: Self-pay | Admitting: *Deleted

## 2012-11-19 NOTE — Telephone Encounter (Signed)
I have called and left the patient a message to call the office regarding her appts in 2015.  JMW

## 2012-11-19 NOTE — Telephone Encounter (Signed)
Mailed a calendar   JMW

## 2012-11-23 ENCOUNTER — Telehealth: Payer: Self-pay | Admitting: *Deleted

## 2012-11-23 NOTE — Telephone Encounter (Signed)
Patient called and left message to call her back. She had some questions regarding her appts for next year. JMW

## 2012-11-26 ENCOUNTER — Telehealth: Payer: Self-pay | Admitting: *Deleted

## 2012-11-26 NOTE — Telephone Encounter (Signed)
Patient called and left a message that she needs to make changes to her schedule. I have called her back and left message to call me. JMW

## 2012-11-26 NOTE — Telephone Encounter (Signed)
Patient called and needs to change her appts for next year. Patient still wishes to keep her lab appt one week before MD visit. Patient needs appts in the afternoon after 3pm.  No slots open, all appts after 3pm are new pt slots. Desk RN notified to advise new appt times. Patient aware that I contacted desk RN, and that I will call her back. [name] request Martie Lee in lab.  JMW

## 2013-08-30 ENCOUNTER — Telehealth: Payer: Self-pay | Admitting: *Deleted

## 2013-08-30 NOTE — Telephone Encounter (Signed)
Pt called for her annually f/u. gv appt for 11/22/13 w/labs@ 2:30p nad ov@3pm . Pt is aware.Sabrina Kitchentd

## 2013-10-28 ENCOUNTER — Ambulatory Visit: Payer: Medicare Other | Admitting: Oncology

## 2013-10-28 ENCOUNTER — Other Ambulatory Visit: Payer: Medicare Other | Admitting: Lab

## 2013-11-11 ENCOUNTER — Ambulatory Visit: Payer: Medicare Other | Admitting: Oncology

## 2013-11-11 ENCOUNTER — Other Ambulatory Visit: Payer: Medicare Other | Admitting: Lab

## 2013-11-22 ENCOUNTER — Other Ambulatory Visit (HOSPITAL_BASED_OUTPATIENT_CLINIC_OR_DEPARTMENT_OTHER): Payer: Medicare Other

## 2013-11-22 ENCOUNTER — Ambulatory Visit (HOSPITAL_BASED_OUTPATIENT_CLINIC_OR_DEPARTMENT_OTHER): Payer: Medicare Other | Admitting: Oncology

## 2013-11-22 ENCOUNTER — Encounter: Payer: Self-pay | Admitting: Oncology

## 2013-11-22 VITALS — BP 153/83 | HR 91 | Temp 98.4°F | Resp 18 | Ht 60.0 in | Wt 208.7 lb

## 2013-11-22 DIAGNOSIS — Z853 Personal history of malignant neoplasm of breast: Secondary | ICD-10-CM

## 2013-11-22 LAB — CBC WITH DIFFERENTIAL/PLATELET
BASO%: 0.4 % (ref 0.0–2.0)
Basophils Absolute: 0 10*3/uL (ref 0.0–0.1)
EOS%: 1.3 % (ref 0.0–7.0)
Eosinophils Absolute: 0.1 10*3/uL (ref 0.0–0.5)
HCT: 37.4 % (ref 34.8–46.6)
HGB: 11.9 g/dL (ref 11.6–15.9)
LYMPH%: 29.4 % (ref 14.0–49.7)
MCH: 27.7 pg (ref 25.1–34.0)
MCHC: 31.9 g/dL (ref 31.5–36.0)
MCV: 86.8 fL (ref 79.5–101.0)
MONO#: 0.9 10*3/uL (ref 0.1–0.9)
MONO%: 8 % (ref 0.0–14.0)
NEUT#: 6.8 10*3/uL — ABNORMAL HIGH (ref 1.5–6.5)
NEUT%: 60.9 % (ref 38.4–76.8)
Platelets: 262 10*3/uL (ref 145–400)
RBC: 4.31 10*6/uL (ref 3.70–5.45)
RDW: 13.5 % (ref 11.2–14.5)
WBC: 11.1 10*3/uL — ABNORMAL HIGH (ref 3.9–10.3)
lymph#: 3.3 10*3/uL (ref 0.9–3.3)

## 2013-11-22 LAB — COMPREHENSIVE METABOLIC PANEL (CC13)
ALT: 13 U/L (ref 0–55)
AST: 11 U/L (ref 5–34)
Albumin: 3.5 g/dL (ref 3.5–5.0)
Alkaline Phosphatase: 94 U/L (ref 40–150)
Anion Gap: 7 mEq/L (ref 3–11)
BUN: 11.5 mg/dL (ref 7.0–26.0)
CO2: 28 mEq/L (ref 22–29)
Calcium: 10 mg/dL (ref 8.4–10.4)
Chloride: 103 mEq/L (ref 98–109)
Creatinine: 0.8 mg/dL (ref 0.6–1.1)
Glucose: 319 mg/dl — ABNORMAL HIGH (ref 70–140)
Potassium: 4.2 mEq/L (ref 3.5–5.1)
Sodium: 138 mEq/L (ref 136–145)
Total Bilirubin: 0.2 mg/dL (ref 0.20–1.20)
Total Protein: 6.9 g/dL (ref 6.4–8.3)

## 2013-11-22 NOTE — Progress Notes (Signed)
OFFICE PROGRESS NOTE  CC  Sabrina Smolder, MD [address] [place] [address] Dr. Neldon Mc Dr. 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 Diagnosed January 2006.  PRIOR THERAPY:  #1 patient underwent a lumpectomy of the left breast with the final pathology revealing a 1.5 cm invasive ductal carcinoma with one sentinel node positive. She subsequently in [place] 2006 had a left axillary lymph node dissection. 0 of 11 lymph nodes were positive at the axilla dissection therefore her final pathologic staging was T1, 1/12lymph nodes positive for metastatic disease.the tumor was ER negative PR negative HER-2/neu negative.  #2 patient went on to receive adjuvant chemotherapy consisting of Taxotere Adriamycin Cytoxan every 3 weeks for total of 6 cycles.  #3 she then received adjuvant radiation therapy.which she completed in September 2006. Since then she has been on observation only  CURRENT THERAPY:observation  INTERVAL HISTORY: [name] 68 y.o. female returns for followup visit today. Overall she's doing well and is without any complaints. She denies any fevers chills night sweats headaches shortness of breath chest pains palpitations no myalgias and arthralgias no peripheral paresthesias. Remainder of the 10 point review of systems is negative.  MEDICAL HISTORY: Past Medical History  Diagnosis Date  . Hyperlipidemia   . Hypertension   . Diabetes mellitus     ALLERGIES:  has No Known Allergies.  MEDICATIONS:  Current Outpatient Prescriptions  Medication Sig Dispense Refill  . aspirin 81 MG tablet Take 81 mg by mouth daily.      Marland Kitchen atorvastatin (LIPITOR) 10 MG tablet Take 10 mg by mouth daily.        . ergocalciferol (VITAMIN D2) 50000 UNITS capsule Take 50,000 Units by mouth once a week.        Marland Kitchen glimepiride (AMARYL) 2 MG tablet Take 4 mg by mouth 2  (two) times daily.       . Multiple Vitamins-Minerals (MULTIVITAMIN WITH MINERALS) tablet Take 1 tablet by mouth daily.        . saxagliptin HCl (ONGLYZA) 2.5 MG TABS tablet Take 2.5 mg by mouth daily.         No current facility-administered medications for this visit.    SURGICAL HISTORY: History reviewed. No pertinent past surgical history.  REVIEW OF SYSTEMS:  Pertinent items are noted in HPI.   HEALTH MAINTENANCE:   PHYSICAL EXAMINATION: Blood pressure 153/83, pulse 91, temperature 98.4 F (36.9 C), temperature source Oral, resp. rate 18, height 5' (1.524 m), weight 208 lb 11.2 oz (94.666 kg). Body mass index is 40.76 kg/(m^2). ECOG PERFORMANCE STATUS: 0 - Asymptomatic   General appearance: alert, cooperative and appears stated age Resp: clear to auscultation bilaterally Cardio: regular rate and rhythm GI: soft, non-tender; bowel sounds normal; no masses,  no organomegaly Extremities: extremities normal, atraumatic, no cyanosis or edema Neurologic: Grossly normal   LABORATORY DATA: Lab Results  Component Value Date   WBC 11.1* 11/22/2013   HGB 11.9 11/22/2013   HCT 37.4 11/22/2013   MCV 86.8 11/22/2013   PLT 262 11/22/2013      Chemistry      Component Value Date/Time   NA 137 11/03/2012 1419   NA 138 10/08/2010 1332   K 3.6 11/03/2012 1419   K 4.0 10/08/2010 1332   CL 104 11/03/2012 1419   CL 102 10/08/2010 1332   CO2 25 11/03/2012 1419   CO2 25 10/08/2010  1332   BUN 12.9 11/03/2012 1419   BUN 15 10/08/2010 1332   CREATININE 0.7 11/03/2012 1419   CREATININE 0.89 10/08/2010 1332      Component Value Date/Time   CALCIUM 9.5 11/03/2012 1419   CALCIUM 9.5 10/08/2010 1332   ALKPHOS 108 11/03/2012 1419   ALKPHOS 71 10/08/2010 1332   AST 12 11/03/2012 1419   AST 16 10/08/2010 1332   ALT 12 11/03/2012 1419   ALT 13 10/08/2010 1332   BILITOT 0.36 11/03/2012 1419   BILITOT 0.3 10/08/2010 1332       RADIOGRAPHIC STUDIES:  No results found.  ASSESSMENT: 68 year old female with  #1   history of stage II a invasive ductal carcinoma of the left breast status post lumpectomy with axillary lymph node dissection with final pathology revealing a 1.5 cm invasive triple negative breast cancer one of 12 lymph nodes positive for metastatic disease. Patient received adjuvant chemotherapy consisting of TAC with day 2 Neulasta. Overall she tolerated this well. She completed adjuvant radiation therapy in September 2006. She is without any evidence of recurrent disease.  #2. Follow up: patient now will be discharged from the clinic. She is encouraged to continue seeing her PCP on a regular basis. Encouraged her to eat healthy, exercise and maintain a good BMI. She should continue to have her annual mammograms as scheduled.    All questions were answered. The patient knows to call the clinic with any problems, questions or concerns. We can certainly see the patient much sooner if necessary.  I spent 20 minutes counseling the patient face to face. The total time spent in the appointment was 30 minutes.    Marcy Panning, MD Medical/Oncology [facility] [phone] (beeper) [phone] (Office)  11/22/2013, 3:27 PM

## 2014-06-09 ENCOUNTER — Telehealth: Payer: Self-pay | Admitting: *Deleted

## 2014-06-09 NOTE — Telephone Encounter (Signed)
Orders for [id number] and [id number] faxed to [employer] [facility]. Original sent to HIM to be scanned into patient's chart.

## 2014-08-01 DIAGNOSIS — H21521 Goniosynechiae, right eye: Secondary | ICD-10-CM | POA: Insufficient documentation

## 2014-08-01 DIAGNOSIS — H25813 Combined forms of age-related cataract, bilateral: Secondary | ICD-10-CM | POA: Insufficient documentation

## 2014-08-03 DIAGNOSIS — H40013 Open angle with borderline findings, low risk, bilateral: Secondary | ICD-10-CM | POA: Insufficient documentation

## 2015-07-19 ENCOUNTER — Other Ambulatory Visit: Payer: Self-pay | Admitting: Gastroenterology

## 2015-07-20 ENCOUNTER — Encounter (HOSPITAL_COMMUNITY): Payer: Self-pay | Admitting: *Deleted

## 2015-07-23 NOTE — Anesthesia Preprocedure Evaluation (Addendum)
Anesthesia Evaluation  Patient identified by MRN, date of birth, ID band Patient awake    Reviewed: Allergy & Precautions, H&P , NPO status , Patient's Chart, lab work & pertinent test results  Airway Mallampati: II  TM Distance: >3 FB Neck ROM: full    Dental  (+) Dental Advisory Given, Caps 4 front upper teeth are capped:   Pulmonary neg pulmonary ROS,    Pulmonary exam normal breath sounds clear to auscultation       Cardiovascular Exercise Tolerance: Good hypertension, Normal cardiovascular exam Rhythm:regular Rate:Normal     Neuro/Psych negative neurological ROS  negative psych ROS   GI/Hepatic negative GI ROS, Neg liver ROS,   Endo/Other  diabetes, Well Controlled, Type 2, Insulin Dependent, Oral Hypoglycemic AgentsMorbid obesity  Renal/GU negative Renal ROS  negative genitourinary   Musculoskeletal   Abdominal (+) + obese,   Peds  Hematology negative hematology ROS (+)   Anesthesia Other Findings Breast cancer  Reproductive/Obstetrics negative OB ROS                          Anesthesia Physical Anesthesia Plan  ASA: III  Anesthesia Plan: MAC   Post-op Pain Management:    Induction:   Airway Management Planned:   Additional Equipment:   Intra-op Plan:   Post-operative Plan:   Informed Consent: I have reviewed the patients History and Physical, chart, labs and discussed the procedure including the risks, benefits and alternatives for the proposed anesthesia with the patient or authorized representative who has indicated his/her understanding and acceptance.   Dental Advisory Given  Plan Discussed with: CRNA and Surgeon  Anesthesia Plan Comments:         Anesthesia Quick Evaluation

## 2015-07-24 ENCOUNTER — Ambulatory Visit (HOSPITAL_COMMUNITY): Payer: Medicare Other | Admitting: Certified Registered Nurse Anesthetist

## 2015-07-24 ENCOUNTER — Encounter (HOSPITAL_COMMUNITY): Payer: Self-pay | Admitting: *Deleted

## 2015-07-24 ENCOUNTER — Ambulatory Visit (HOSPITAL_COMMUNITY)
Admission: RE | Admit: 2015-07-24 | Discharge: 2015-07-24 | Disposition: A | Discharge status: Home / Self Care | Payer: Medicare Other | Source: Ambulatory Visit | Attending: Gastroenterology | Admitting: Gastroenterology

## 2015-07-24 ENCOUNTER — Encounter (HOSPITAL_COMMUNITY)
Admission: RE | Disposition: A | Discharge status: Home / Self Care | Payer: Self-pay | Source: Ambulatory Visit | Attending: Gastroenterology

## 2015-07-24 DIAGNOSIS — R197 Diarrhea, unspecified: Secondary | ICD-10-CM | POA: Diagnosis present

## 2015-07-24 DIAGNOSIS — Z6841 Body Mass Index (BMI) 40.0 and over, adult: Secondary | ICD-10-CM | POA: Insufficient documentation

## 2015-07-24 DIAGNOSIS — H3530 Unspecified macular degeneration: Secondary | ICD-10-CM | POA: Insufficient documentation

## 2015-07-24 DIAGNOSIS — E119 Type 2 diabetes mellitus without complications: Secondary | ICD-10-CM | POA: Diagnosis not present

## 2015-07-24 DIAGNOSIS — Z794 Long term (current) use of insulin: Secondary | ICD-10-CM | POA: Insufficient documentation

## 2015-07-24 DIAGNOSIS — Z800 Family history of malignant neoplasm of digestive organs: Secondary | ICD-10-CM | POA: Insufficient documentation

## 2015-07-24 DIAGNOSIS — E7800 Pure hypercholesterolemia, unspecified: Secondary | ICD-10-CM | POA: Insufficient documentation

## 2015-07-24 DIAGNOSIS — Z79899 Other long term (current) drug therapy: Secondary | ICD-10-CM | POA: Insufficient documentation

## 2015-07-24 DIAGNOSIS — Z7982 Long term (current) use of aspirin: Secondary | ICD-10-CM | POA: Insufficient documentation

## 2015-07-24 DIAGNOSIS — I10 Essential (primary) hypertension: Secondary | ICD-10-CM | POA: Diagnosis not present

## 2015-07-24 DIAGNOSIS — Z853 Personal history of malignant neoplasm of breast: Secondary | ICD-10-CM | POA: Insufficient documentation

## 2015-07-24 HISTORY — DX: Malignant (primary) neoplasm, unspecified: C80.1

## 2015-07-24 HISTORY — PX: COLONOSCOPY WITH PROPOFOL: SHX5780

## 2015-07-24 LAB — GLUCOSE, CAPILLARY: Glucose-Capillary: 183 mg/dL — ABNORMAL HIGH (ref 65–99)

## 2015-07-24 SURGERY — COLONOSCOPY WITH PROPOFOL
Anesthesia: Monitor Anesthesia Care

## 2015-07-24 MED ORDER — PROPOFOL 10 MG/ML IV BOLUS
INTRAVENOUS | Status: AC
  Filled 2015-07-24: qty 20

## 2015-07-24 MED ORDER — ONDANSETRON HCL 4 MG/2ML IJ SOLN
INTRAMUSCULAR | Status: DC | PRN
  Administered 2015-07-24: 4 mg via INTRAVENOUS

## 2015-07-24 MED ORDER — LACTATED RINGERS IV SOLN
INTRAVENOUS | Status: DC
Start: 2015-07-24 — End: 2015-07-24

## 2015-07-24 MED ORDER — FENTANYL CITRATE (PF) 100 MCG/2ML IJ SOLN: 25.0000 ug | INTRAMUSCULAR | Status: DC | PRN

## 2015-07-24 MED ORDER — PROPOFOL 500 MG/50ML IV EMUL
INTRAVENOUS | Status: DC | PRN
  Administered 2015-07-24: 100 ug/kg/min via INTRAVENOUS

## 2015-07-24 MED ORDER — ONDANSETRON HCL 4 MG/2ML IJ SOLN
INTRAMUSCULAR | Status: AC
  Filled 2015-07-24: qty 2

## 2015-07-24 MED ORDER — LIDOCAINE HCL (CARDIAC) 20 MG/ML IV SOLN
INTRAVENOUS | Status: DC | PRN
  Administered 2015-07-24: 100 mg via INTRAVENOUS

## 2015-07-24 MED ORDER — PROPOFOL 10 MG/ML IV BOLUS
INTRAVENOUS | Status: DC | PRN
  Administered 2015-07-24: 20 mg via INTRAVENOUS
  Administered 2015-07-24: 10 mg via INTRAVENOUS

## 2015-07-24 MED ORDER — LACTATED RINGERS IV SOLN
INTRAVENOUS | Status: DC
  Administered 2015-07-24: 1000 mL via INTRAVENOUS

## 2015-07-24 MED ORDER — LIDOCAINE HCL (CARDIAC) 20 MG/ML IV SOLN
INTRAVENOUS | Status: AC
  Filled 2015-07-24: qty 5

## 2015-07-24 MED ORDER — SODIUM CHLORIDE 0.9 % IV SOLN: INTRAVENOUS | Status: DC

## 2015-07-24 SURGICAL SUPPLY — 22 items

## 2015-07-24 NOTE — H&P (Signed)
  Problem: Chronic diarrhea unassociated with weight loss. 03/10/2006 normal screening colonoscopy performed.  History: The patient is a 69 year old female born 30-Nov-1945. She underwent a normal screening colonoscopy in June 2007. She has chronic type 2 diabetes mellitus and is a breast cancer survivor.  She has intermittent watery diarrhea. Proximal 4 months. When she was most symptomatic, she would have up to 8 watery bowel movements daily rarely associated with nocturnal diarrhea. Since starting Imodium, she has been having approximately 3 formed bowel movements daily. She denies anorexia, abdominal pain, or gastrointestinal bleeding. He has not traveled or taken antibiotics recently.  Her grandmother was diagnosed with colon cancer in her late 85s.  The patient is scheduled to undergo diagnostic colonoscopy with random colon biopsies performed to look for microscopic colitis.  Past medical history: Total abdominal hysterectomy. Bilateral salpingo-pleurectomy. Left breast lumpectomy for breast cancer. Arthroscopic right knee surgery. Hemorrhoidectomy. Arthroscopic left knee surgery type 2 diabetes mellitus. Vitamin issues. Hypercholesterolemia. 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 Grandmother was diagnosed with tuberculosis. Macular degeneration. Osteoarthritis.  Medication allergies: Cymbalta caused itching.  Exam: The patient is alert and lying comfortably on the endoscopy stretcher. Abdomen is soft and nontender to palpation. Cardiac exam reveals a regular rhythm. Lungs are clear to auscultation.  Plan: Proceed with diagnostic colonoscopy

## 2015-07-24 NOTE — Op Note (Signed)
Problem: Chronic, intermittent watery diarrhea without weight loss. Normal screening colonoscopy performed on 03/10/2006. Breast cancer survivor.  Endoscopist: Earle Gell  Premedication: Propofol administered by anesthesia  Procedure: The patient was placed in the left lateral decubitus position. Anal inspection and digital rectal exam were normal. The Pentax pediatric colonoscope was introduced into the rectum and advanced to cecum. A normal-appearing appendiceal orifice was identified. A normal-appearing ileocecal valve was intubated and the terminal ileum inspected. Colonic preparation for the exam today was good. Withdrawal time was 9 minutes  Rectum. Normal. Retroflexed view of the distal rectum was normal  Sigmoid colon and descending colon. Normal  Splenic flexure. Normal  Transverse colon. Normal  Hepatic flexure. Normal  Ascending colon. Normal  Cecum and ileocecal valve. Normal  Terminal ileum. Normal  Random colon biopsies: Three biopsies were taken from the right colon and three biopsies were taken from the left colon to look for microscopic colitis  Assessment: Normal screening colonoscopy. Random colon biopsies to look for microscopic colitis pending

## 2015-07-24 NOTE — Anesthesia Postprocedure Evaluation (Signed)
  Anesthesia Post-op Note  Patient: [name]  Procedure(s) Performed: Procedure(s) (LRB): COLONOSCOPY WITH PROPOFOL (N/A)  Patient Location: PACU  Anesthesia Type: MAC  Level of Consciousness: awake and alert   Airway and Oxygen Therapy: Patient Spontanous Breathing  Post-op Pain: mild  Post-op Assessment: Post-op Vital signs reviewed, Patient's Cardiovascular Status Stable, Respiratory Function Stable, Patent Airway and No signs of Nausea or vomiting  Last Vitals:  Filed Vitals:   07/24/15 0840  BP:   Pulse: 72  Temp:   Resp: 15    Post-op Vital Signs: stable   Complications: No apparent anesthesia complications

## 2015-07-24 NOTE — Discharge Instructions (Signed)

## 2015-07-24 NOTE — Transfer of Care (Signed)
Immediate Anesthesia Transfer of Care Note  Patient: [name]  Procedure(s) Performed: Procedure(s): COLONOSCOPY WITH PROPOFOL (N/A)  Patient Location: ENDO  Anesthesia Type:MAC  Level of Consciousness:  sedated, patient cooperative and responds to stimulation  Airway & Oxygen Therapy:Patient Spontanous Breathing and Patient connected to face mask oxgen  Post-op Assessment:  Report given to ENDO RN and Post -op Vital signs reviewed and stable  Post vital signs:  Reviewed and stable  Last Vitals:  Filed Vitals:   07/24/15 0709  BP: 170/75  Pulse: 89  Temp: 37.2 C  Resp: 17    Complications: No apparent anesthesia complications

## 2015-07-25 ENCOUNTER — Encounter (HOSPITAL_COMMUNITY): Payer: Self-pay | Admitting: Gastroenterology

## 2016-01-03 ENCOUNTER — Other Ambulatory Visit: Payer: Self-pay | Admitting: Obstetrics and Gynecology

## 2016-01-10 ENCOUNTER — Encounter: Payer: Self-pay | Admitting: Gynecologic Oncology

## 2016-01-10 ENCOUNTER — Ambulatory Visit: Payer: Medicare Other | Attending: Gynecologic Oncology | Admitting: Gynecologic Oncology

## 2016-01-10 VITALS — BP 174/91 | HR 74 | Temp 97.9°F | Resp 18 | Ht 60.0 in | Wt 217.4 lb

## 2016-01-10 DIAGNOSIS — E1169 Type 2 diabetes mellitus with other specified complication: Secondary | ICD-10-CM | POA: Insufficient documentation

## 2016-01-10 DIAGNOSIS — C511 Malignant neoplasm of labium minus: Secondary | ICD-10-CM

## 2016-01-10 DIAGNOSIS — C519 Malignant neoplasm of vulva, unspecified: Secondary | ICD-10-CM | POA: Diagnosis present

## 2016-01-10 DIAGNOSIS — Z79899 Other long term (current) drug therapy: Secondary | ICD-10-CM | POA: Diagnosis not present

## 2016-01-10 DIAGNOSIS — Z7982 Long term (current) use of aspirin: Secondary | ICD-10-CM | POA: Insufficient documentation

## 2016-01-10 DIAGNOSIS — I10 Essential (primary) hypertension: Secondary | ICD-10-CM | POA: Diagnosis not present

## 2016-01-10 DIAGNOSIS — Z9889 Other specified postprocedural states: Secondary | ICD-10-CM | POA: Diagnosis not present

## 2016-01-10 DIAGNOSIS — Z853 Personal history of malignant neoplasm of breast: Secondary | ICD-10-CM | POA: Diagnosis not present

## 2016-01-10 DIAGNOSIS — Z6841 Body Mass Index (BMI) 40.0 and over, adult: Secondary | ICD-10-CM | POA: Diagnosis not present

## 2016-01-10 DIAGNOSIS — E785 Hyperlipidemia, unspecified: Secondary | ICD-10-CM | POA: Insufficient documentation

## 2016-01-10 DIAGNOSIS — N891 Moderate vaginal dysplasia: Secondary | ICD-10-CM | POA: Diagnosis not present

## 2016-01-10 NOTE — Progress Notes (Signed)
Script for cranial prosthesis (Wig) faxed to [facility], Attn Katrina.

## 2016-01-10 NOTE — Progress Notes (Signed)
Consult Note: Gyn-Onc  Consult was requested by Dr. Simona Huh for the evaluation of [name] 70 y.o. female  CC:  Chief Complaint  Patient presents with  . vulvar mass    Assessment/Plan:  Ms. MARGRIE BALLARD  is a 70 y.o.  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  It appears more consistent with an invasive vulvar cancer rather than dysplasia, therefore we re-biopsied today and will follow-up these results to confirm my clinical suspicions.  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 The lesion crosses the midline and therefore bilateral groin dissections are necessary. I discussed that due the the posterior nature of some of the lesion, acquiring negative margins will be challenging. I discussed that close or positive margins and positive lymph nodes would necessitate further therapy with radiation.   I discussed that she is at high risk for wound breakdown and separation due to the fact that she is obese (BMI 43) and diabetic. We discussed postoperative stay (24 hours) and postoperative wound care and limitations. I discussed she will be discharged With bilateral inguinal drains which will need to be evaluated at home for drainage and she will need to return to the clinic weekly for evaluation of these. We will remove the drains when output is minimal. I discussed risk associated with surgery including wound separation, cellulitis, neck testing fasciitis, reoperation, damage today's in structures, fecal or urinary incontinence, lymphedema of the lower extremity, recurrence.  Surgery is currently scheduled for May 11. If the biopsy returns as dysplasia rather than malignancy we will perform a less radical procedure to confirm malignancy prior to proceeding with a radical vulvectomy. HPI: [name] is a very pleasant  70 year old G2 P2 who is seen in consultation at the request of Dr. Simona Huh for a vaginal mass.  The patient will reports feeling a "knot" in her vagina for the past 3 months. She was seen by her primary care doctor who then referred her to Dr. Simona Huh on April 12th, 2017 where a lesion was identified in the distal vagina/vulvar on the right and was biopsied. 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 The fragments were superficial but no invasion was identified.  The patient is otherwise fairly healthy. She is morbidly obese with a BMI of 42 kg/m. She has diabetes mellitus hyperlipidemia and hypertension. She is a remote history of breast cancer in February 2006 treated with surgery, chemotherapy, and radiation. She has long-standing left upper tremulous edema due to this treatment. Her only prior abdominal surgery was a cesarean section. She has a history of a vaginal hysterectomy for fibroids and abnormal bleeding in 1995. She denies history of cervical vaginal dysplasia. She is only had one sexual partner in her life who is her current Husband.  Current Meds:  Outpatient Encounter Prescriptions as of 01/10/2016  Medication Sig  . aspirin 81 MG tablet Take 81 mg by mouth daily.  . ergocalciferol (VITAMIN D2) 50000 UNITS capsule Take 50,000 Units by mouth once a week.    . Insulin Lispro Prot & Lispro (HUMALOG MIX 75/25 KWIKPEN) (75-25) 100 UNIT/ML Kwikpen   . [DISCONTINUED] atorvastatin (LIPITOR) 10 MG tablet Take 10 mg by mouth daily.    . [DISCONTINUED] insulin aspart protamine- aspart (NOVOLOG MIX 70/30) (70-30) 100 UNIT/ML injection Inject 18-20 Units into the skin 2 (two) times  daily with a meal. Takes 18 units in the morning and 20 units at night  . [DISCONTINUED] saxagliptin HCl (ONGLYZA) 5 MG TABS tablet Take 5 mg by mouth daily.   No facility-administered encounter medications on file as of 01/10/2016.    Allergy: No Known  Allergies  Social Hx:   Social History   Social History  . Marital Status: Married    Spouse Name: N/A  . Number of Children: N/A  . Years of Education: N/A   Occupational History  . Not on file.   Social History Main Topics  . Smoking status: Never Smoker   . Smokeless tobacco: Not on file  . Alcohol Use: No  . Drug Use: No  . Sexual Activity: Yes   Other Topics Concern  . Not on file   Social History Narrative    Past Surgical Hx:  Past Surgical History  Procedure Laterality Date  . Breast surgery      left breast lumpectomy   . Abdominal hysterectomy    . Eye surgery      removal of growth 35 years ago  . Hemorroidectomy      38 years ago, no present symtoms  . Colonoscopy with propofol N/A 07/24/2015    Procedure: COLONOSCOPY WITH PROPOFOL;  Surgeon: Garlan Fair, MD;  Location: [facility];  Service: Endoscopy;  Laterality: N/A;    Past Medical Hx:  Past Medical History  Diagnosis Date  . Hyperlipidemia   . Hypertension   . Diabetes mellitus   . Cancer [facility])     breast cancer 2005 surgery, chemo and radiation   . Breast cancer [facility])     Past Gynecological History:  SVD x 1 and C/s x 1, hysterectomy for fibroids (vaginal)  No LMP recorded.  Family Hx:  Family History  Problem Relation Age of Onset  . Cancer Maternal Grandmother     Review of Systems:  Constitutional  Feels well,   ENT Normal appearing ears and nares bilaterally Skin/Breast  No rash, sores, jaundice, itching, dryness Cardiovascular  No chest pain, shortness of breath, or edema  Pulmonary  No cough or wheeze.  Gastro Intestinal  No nausea, vomitting, or diarrhoea. No bright red blood per rectum, no abdominal pain, change in bowel movement, or constipation.  Genito Urinary  No frequency, urgency, dysuria, + vaginal bleeding and vaginal mass Musculo Skeletal  No myalgia, arthralgia, joint swelling or pain  Neurologic  No weakness, numbness, change in gait,   Psychology  No depression, anxiety, insomnia.   Vitals:  Blood pressure 174/91, pulse 74, temperature 97.9 F (36.6 C), temperature source Oral, resp. rate 18, height 5' (1.524 m), weight 217 lb 7 oz (98.629 kg).  Physical Exam: WD in NAD Neck  Supple NROM, without any enlargements.  Lymph Node Survey No cervical supraclavicular or inguinal adenopathy Cardiovascular  Pulse normal rate, regularity and rhythm. S1 and S2 normal.  Lungs  Clear to auscultation bilateraly, without wheezes/crackles/rhonchi. Good air movement.  Skin  No rash/lesions/breakdown  Psychiatry  Alert and oriented to person, place, and time  Abdomen  Normoactive bowel sounds, abdomen soft, non-tender and obese without evidence of hernia.  Back No CVA tenderness Genito Urinary  Vulva/vagina: There is a 6cm raised, firm lesion extending from the most anterior part of the right labia minora, posteriorally and crossing midline to left posterior vaginal introitus. It is somewhat exophytic and bleeds very easilty to touch. It was biopsied. >1cm from urethral meatus and anal sphincter.  Bladder/urethra:  No lesions or masses, well supported bladder  Vagina: Normal  Cervix: surgically absent  Uterus: surgically absent  Adnexa: no palpable masses. Rectal  Good tone, no masses no cul de sac nodularity.  Extremities  No bilateral cyanosis, clubbing or edema.  PROCEDURE NOTE: vulvar biopsy The bulk consent was obtained from the patient. The right labia minora was prepped with Betadine. 1 mL of 1% lidocaine was infiltrated into the anterior portion of the labia minora on the right. A 2 mm punch biopsy was taken to obtain tissue from the exophytic portion of the mass. Hemostasis was achieved with Monsel's apply topically. Patient tolerated procedure well. The specimen was sent for permanent pathology.  Donaciano Eva, MD  01/10/2016, 2:37 PM

## 2016-01-10 NOTE — Patient Instructions (Addendum)
We will call you with the results of your biopsy from today.  Plan to have a PET scan on May 1 at [facility] at 10am but arrive at 9:30am.  Nothing to eat or drink after midnight the night before your PET scan and you will need to miss your morning dose of insulin the day of your PET scan (May 1).                  Preparing for your Surgery  Plan for surgery on May 11 with Dr. Denman George. You will be scheduled for a radical vulvectomy with bilateral groin lymphadenectomy at [place] 81 mg NOW.  Pre-operative Testing -You will receive a phone call from presurgical testing at [facility] to arrange for a pre-operative testing appointment before your surgery.  This appointment normally occurs one to two weeks before your scheduled surgery.   -Bring your insurance card, copy of an advanced directive if applicable, medication list  -At that visit, you will be asked to sign a consent for a possible blood transfusion in case a transfusion becomes necessary during surgery.  The need for a blood transfusion is rare but having consent is a necessary part of your care.     Day Before Surgery at [place] will be advised to have nothing to eat or drink after midnight the evening before.    Your role in recovery Your role is to become active as soon as directed by your doctor, while still giving yourself time to heal.  Rest when you feel tired. You will be asked to do the following in order to speed your recovery:  - Cough and breathe deeply. This helps toclear and expand your lungs and can prevent pneumonia. You may be given a spirometer to practice deep breathing. A staff member will show you how to use the spirometer. - Do mild physical activity. Walking or moving your legs help your circulation and body functions return to normal. A staff member will help you when you try to walk and will provide you with simple exercises. Do not try to get up or  walk alone the first time. - Actively manage your pain. Managing your pain lets you move in comfort. We will ask you to rate your pain on a scale of zero to 10. It is your responsibility to tell your doctor or nurse where and how much you hurt so your pain can be treated.  Special Considerations -If you are diabetic, you may be placed on insulin after surgery to have closer control over your blood sugars to promote healing and recovery.  This does not mean that you will be discharged on insulin.  If applicable, your oral antidiabetics will be resumed when you are tolerating a solid diet.  -Your final pathology results from surgery should be available by the Friday after surgery and the results will be relayed to you when available.  Blood Transfusion Information WHAT IS A BLOOD TRANSFUSION? A transfusion is the replacement of blood or some of its parts. Blood is made up of multiple cells which provide different functions.  Red blood cells carry oxygen and are used for blood loss replacement.  White blood cells fight against infection.  Platelets control bleeding.  Plasma helps clot blood.  Other blood products are available for specialized needs, such as hemophilia or other clotting disorders. BEFORE THE TRANSFUSION  Who gives blood for transfusions?   You may  be able to donate blood to be used at a later date on yourself (autologous donation).  Relatives can be asked to donate blood. This is generally not any safer than if you have received blood from a stranger. The same precautions are taken to ensure safety when a relative's blood is donated.  Healthy volunteers who are fully evaluated to make sure their blood is safe. This is blood bank blood. Transfusion therapy is the safest it has ever been in the practice of medicine. Before blood is taken from a donor, a complete history is taken to make sure that person has no history of diseases nor engages in risky social behavior (examples  are intravenous drug use or sexual activity with multiple partners). The donor's travel history is screened to minimize risk of transmitting infections, such as malaria. The donated blood is tested for signs of infectious diseases, such as HIV and hepatitis. The blood is then tested to be sure it is compatible with you in order to minimize the chance of a transfusion reaction. If you or a relative donates blood, this is often done in anticipation of surgery and is not appropriate for emergency situations. It takes many days to process the donated blood. RISKS AND COMPLICATIONS Although transfusion therapy is very safe and saves many lives, the main dangers of transfusion include:   Getting an infectious disease.  Developing a transfusion reaction. This is an allergic reaction to something in the blood you were given. Every precaution is taken to prevent this. The decision to have a blood transfusion has been considered carefully by your caregiver before blood is given. Blood is not given unless the benefits outweigh the risks.  Bulb Drain Home Care A bulb drain consists of a thin rubber tube and a soft, round bulb that creates a gentle suction. The rubber tube is placed in the area where you had surgery. A bulb is attached to the end of the tube that is outside the body. The bulb drain removes excess fluid that normally builds up in a surgical wound after surgery. The color and amount of fluid will vary. Immediately after surgery, the fluid is bright red and is a little thicker than water. It may gradually change to a yellow or pink color and become more thin and water-like. When the amount decreases to about 1 or 2 tbsp in 24 hours, your health care provider will usually remove it. DAILY CARE  Keep the bulb flat (compressed) at all times, except while emptying it. The flatness creates suction. You can flatten the bulb by squeezing it firmly in the middle and then closing the cap.  Keep sites where  the tube enters the skin dry and covered with a bandage (dressing).  Secure the tube 1-2 in (2.5-5.1 cm) below the insertion sites to keep it from pulling on your stitches. The tube is stitched in place and will not slip out.  Secure the bulb as directed by your health care provider.  For the first 3 days after surgery, there usually is more fluid in the bulb. Empty the bulb whenever it becomes half full because the bulb does not create enough suction if it is too full. The bulb could also overflow. Write down how much fluid you remove each time you empty your drain. Add up the amount removed in 24 hours.  Empty the bulb at the same time every day once the amount of fluid decreases and you only need to empty it once a day. Write  down the amounts and the 24-hour totals to give to your health care provider. This helps your health care provider know when the tubes can be removed. EMPTYING THE BULB DRAIN Before emptying the bulb, get a measuring cup, a piece of paper and a pen, and wash your hands.  Gently run your fingers down the tube (stripping) to empty any drainage from the tubing into the bulb. This may need to be done several times a day to clear the tubing of clots and tissue.  Open the bulb cap to release suction, which causes it to inflate. Do not touch the inside of the cap.  Gently run your fingers down the tube (stripping) to empty any drainage from the tubing into the bulb.  Hold the cap out of the way, and pour fluid into the measuring cup.   Squeeze the bulb to provide suction.  Replace the cap.   Check the tape that holds the tube to your skin. If it is becoming loose, you can remove the loose piece of tape and apply a new one. Then, pin the bulb to your shirt.   Write down the amount of fluid you emptied out. Write down the date and each time you emptied your bulb drain. (If there are 2 bulbs, note the amount of drainage from each bulb and keep the totals separate. Your  health care provider will want to know the total amounts for each drain and which tube is draining more.)   Flush the fluid down the toilet and wash your hands.   Call your health care provider once you have less than 2 tbsp of fluid collecting in the bulb drain every 24 hours. If there is drainage around the tube site, change dressings and keep the area dry. Cleanse around tube with sterile saline and place dry gauze around site. This gauze should be changed when it is soiled. If it stays clean and unsoiled, it should still be changed daily.  SEEK MEDICAL CARE IF:  Your drainage has a bad smell or is cloudy.   You have a fever.   Your drainage is increasing instead of decreasing.   Your tube fell out.   You have redness or swelling around the tube site.   You have drainage from a surgical wound.   Your bulb drain will not stay flat after you empty it.  MAKE SURE YOU:   Understand these instructions.  Will watch your condition.  Will get help right away if you are not doing well or get worse.   This information is not intended to replace advice given to you by your health care provider. Make sure you discuss any questions you have with your health care provider.   Document Released: 09/06/2000 Document Revised: 09/30/2014 Document Reviewed: 03/29/2015 Elsevier Interactive Patient Education [employer].

## 2016-01-12 ENCOUNTER — Telehealth: Payer: Self-pay | Admitting: Gynecologic Oncology

## 2016-01-12 NOTE — Telephone Encounter (Signed)
Left message to call back. Her biopsy is suspicious for invasive vulvar cancer and this is suspected on clinical exam. We will proceed with PET/CT and radical vulvectomy, bilateral inguinal lymphadenectomy

## 2016-01-22 ENCOUNTER — Ambulatory Visit (HOSPITAL_COMMUNITY)
Admission: RE | Admit: 2016-01-22 | Discharge: 2016-01-22 | Disposition: A | Discharge status: Home / Self Care | Payer: Medicare Other | Source: Ambulatory Visit | Attending: Gynecologic Oncology | Admitting: Gynecologic Oncology

## 2016-01-22 ENCOUNTER — Encounter (HOSPITAL_COMMUNITY): Payer: Self-pay

## 2016-01-22 ENCOUNTER — Encounter (HOSPITAL_COMMUNITY): Admission: RE | Admit: 2016-01-22 | Payer: Medicare Other | Source: Ambulatory Visit

## 2016-01-22 ENCOUNTER — Other Ambulatory Visit (HOSPITAL_COMMUNITY): Payer: Medicare Other

## 2016-01-22 ENCOUNTER — Encounter (HOSPITAL_COMMUNITY)
Admission: RE | Admit: 2016-01-22 | Discharge: 2016-01-22 | Disposition: A | Discharge status: Home / Self Care | Payer: Medicare Other | Source: Ambulatory Visit | Attending: Gynecologic Oncology | Admitting: Gynecologic Oncology

## 2016-01-22 DIAGNOSIS — C519 Malignant neoplasm of vulva, unspecified: Secondary | ICD-10-CM | POA: Insufficient documentation

## 2016-01-22 DIAGNOSIS — Z01810 Encounter for preprocedural cardiovascular examination: Secondary | ICD-10-CM | POA: Insufficient documentation

## 2016-01-22 DIAGNOSIS — I4510 Unspecified right bundle-branch block: Secondary | ICD-10-CM | POA: Diagnosis not present

## 2016-01-22 HISTORY — DX: Malignant neoplasm of vulva, unspecified: C51.9

## 2016-01-22 HISTORY — DX: Headache, unspecified: R51.9

## 2016-01-22 HISTORY — DX: Headache: R51

## 2016-01-22 LAB — COMPREHENSIVE METABOLIC PANEL
ALT: 13 U/L — ABNORMAL LOW (ref 14–54)
AST: 16 U/L (ref 15–41)
Albumin: 3.8 g/dL (ref 3.5–5.0)
Alkaline Phosphatase: 78 U/L (ref 38–126)
Anion gap: 11 (ref 5–15)
BUN: 12 mg/dL (ref 6–20)
CO2: 25 mmol/L (ref 22–32)
Calcium: 9.5 mg/dL (ref 8.9–10.3)
Chloride: 104 mmol/L (ref 101–111)
Creatinine, Ser: 0.69 mg/dL (ref 0.44–1.00)
GFR calc Af Amer: >60 mL/min (ref >60)
GFR calc non Af Amer: >60 mL/min (ref >60)
Glucose, Bld: 212 mg/dL — ABNORMAL HIGH (ref 65–99)
Potassium: 4 mmol/L (ref 3.5–5.1)
Sodium: 140 mmol/L (ref 135–145)
Total Bilirubin: 0.4 mg/dL (ref 0.3–1.2)
Total Protein: 7 g/dL (ref 6.5–8.1)

## 2016-01-22 LAB — CBC WITH DIFFERENTIAL/PLATELET
Basophils Absolute: 0 K/uL (ref 0.0–0.1)
Basophils Relative: 0 %
Eosinophils Absolute: 0.2 K/uL (ref 0.0–0.7)
Eosinophils Relative: 2 %
HCT: 35.8 % — ABNORMAL LOW (ref 36.0–46.0)
Hemoglobin: 11.8 g/dL — ABNORMAL LOW (ref 12.0–15.0)
Lymphocytes Relative: 34 %
Lymphs Abs: 3.4 K/uL (ref 0.7–4.0)
MCH: 27.8 pg (ref 26.0–34.0)
MCHC: 33 g/dL (ref 30.0–36.0)
MCV: 84.4 fL (ref 78.0–100.0)
Monocytes Absolute: 0.6 K/uL (ref 0.1–1.0)
Monocytes Relative: 6 %
Neutro Abs: 5.8 K/uL (ref 1.7–7.7)
Neutrophils Relative %: 58 %
Platelets: 259 K/uL (ref 150–400)
RBC: 4.24 MIL/uL (ref 3.87–5.11)
RDW: 14 % (ref 11.5–15.5)
WBC: 10 K/uL (ref 4.0–10.5)

## 2016-01-22 LAB — ABO/RH: ABO/RH(D): O POS

## 2016-01-22 LAB — GLUCOSE, CAPILLARY: Glucose-Capillary: 193 mg/dL — ABNORMAL HIGH (ref 65–99)

## 2016-01-22 MED ORDER — FLUDEOXYGLUCOSE F - 18 (FDG) INJECTION
10.8300 | Freq: Once | INTRAVENOUS | Status: AC | PRN
  Administered 2016-01-22: 10.83 via INTRAVENOUS

## 2016-01-22 NOTE — Patient Instructions (Addendum)
Your procedure is scheduled on: 02-01-16 AT 115 PM Report to [facility] Stay, follow signs to [facility] elevator and go to 3rd floor.  Call this number if you have problems morning of your procedure:[phone]    Take these medicines the morning of your procedure with A SIP OF WATER: take 1/2 dose humalog insulin 75/25 at evening time 01-31-16  SOFT FOOD DIET DAY BEFORE SURGERY 01-31-16, NO  FOOD AFTER MIDNIGHT , MAY HAVE CLEAR LIQUIDS FROM MIDNIGHT NIGHT BEFORE SURGERY UNTIL 900 AM DAY OF SURGERY, NOTHING AT ALL BY MOUTH AFTER 900 AM DAY OF SURGERY.      WOMEN: NO MAKE-UP, [facility] - Preparing for Surgery Before surgery, you can play an important role.  Because skin is not sterile, your skin needs to be as free of germs as possible.  You can reduce the number of germs on your skin by washing with CHG (chlorahexidine gluconate) soap before surgery.  CHG is an antiseptic cleaner which kills germs and bonds with the skin to continue killing germs even after washing. Please DO NOT use if you have an allergy to CHG or antibacterial soaps.  If your skin becomes reddened/irritated stop using the CHG and inform your nurse when you arrive at Short Stay. Do not shave (including legs and underarms) for at least 48 hours prior to the first CHG shower.  You may shave your face/neck. Please follow these instructions carefully:  1.  Shower with CHG Soap the night before surgery and the  morning of Surgery.  2.  If you choose to wash your hair, wash your hair first as usual with your  normal  shampoo.  3.  After you shampoo, rinse your hair and body thoroughly to remove the  shampoo.                           4.  Use CHG as you would any other liquid soap.  You can apply chg directly  to the skin and wash                       Gently with a scrungie or clean washcloth.  5.  Apply the CHG Soap to your body ONLY FROM THE NECK DOWN.   Do not use on face/ open                           Wound or  open sores. Avoid contact with eyes, ears mouth and genitals (private parts).                       Wash face,  Genitals (private parts) with your normal soap.             6.  Wash thoroughly, paying special attention to the area where your surgery  will be performed.  7.  Thoroughly rinse your body with warm water from the neck down.  8.  DO NOT shower/wash with your normal soap after using and rinsing off  the CHG Soap.                9.  Pat yourself dry with a clean towel.            10.  Wear clean pajamas.            11.  Place clean sheets on your bed the night  of your first shower and do not  sleep with pets. Day of Surgery : Do not apply any lotions/deodorants the morning of surgery.  Please wear clean clothes to the hospital/surgery center.  FAILURE TO FOLLOW THESE INSTRUCTIONS MAY RESULT IN THE CANCELLATION OF YOUR SURGERY PATIENT SIGNATURE_________________________________  NURSE SIGNATURE__________________________________  ________________________________________________________________________  WHAT IS A BLOOD TRANSFUSION? Blood Transfusion Information  A transfusion is the replacement of blood or some of its parts. Blood is made up of multiple cells which provide different functions.  Red blood cells carry oxygen and are used for blood loss replacement.  White blood cells fight against infection.  Platelets control bleeding.  Plasma helps clot blood.  Other blood products are available for specialized needs, such as hemophilia or other clotting disorders. BEFORE THE TRANSFUSION  Who gives blood for transfusions?   Healthy volunteers who are fully evaluated to make sure their blood is safe. This is blood bank blood. Transfusion therapy is the safest it has ever been in the practice of medicine. Before blood is taken from a donor, a complete history is taken to make sure that person has no history of diseases nor engages in risky social behavior (examples are  intravenous drug use or sexual activity with multiple partners). The donor's travel history is screened to minimize risk of transmitting infections, such as malaria. The donated blood is tested for signs of infectious diseases, such as HIV and hepatitis. The blood is then tested to be sure it is compatible with you in order to minimize the chance of a transfusion reaction. If you or a relative donates blood, this is often done in anticipation of surgery and is not appropriate for emergency situations. It takes many days to process the donated blood. RISKS AND COMPLICATIONS Although transfusion therapy is very safe and saves many lives, the main dangers of transfusion include:  1. Getting an infectious disease. 2. Developing a transfusion reaction. This is an allergic reaction to something in the blood you were given. Every precaution is taken to prevent this. The decision to have a blood transfusion has been considered carefully by your caregiver before blood is given. Blood is not given unless the benefits outweigh the risks. AFTER THE TRANSFUSION  Right after receiving a blood transfusion, you will usually feel much better and more energetic. This is especially true if your red blood cells have gotten low (anemic). The transfusion raises the level of the red blood cells which carry oxygen, and this usually causes an energy increase.  The nurse administering the transfusion will monitor you carefully for complications. HOME CARE INSTRUCTIONS  No special instructions are needed after a transfusion. You may find your energy is better. Speak with your caregiver about any limitations on activity for underlying diseases you may have. SEEK MEDICAL CARE IF:   Your condition is not improving after your transfusion.  You develop redness or irritation at the intravenous (IV) site. SEEK IMMEDIATE MEDICAL CARE IF:  Any of the following symptoms occur over the next 12 hours:  Shaking chills.  You have a  temperature by mouth above 102 F (38.9 C), not controlled by medicine.  Chest, back, or muscle pain.  People around you feel you are not acting correctly or are confused.  Shortness of breath or difficulty breathing.  Dizziness and fainting.  You get a rash or develop hives.  You have a decrease in urine output.  Your urine turns a dark color or changes to pink, red, or brown. Any of the  following symptoms occur over the next 10 days:  You have a temperature by mouth above 102 F (38.9 C), not controlled by medicine.  Shortness of breath.  Weakness after normal activity.  The white part of the eye turns yellow (jaundice).  You have a decrease in the amount of urine or are urinating less often.  Your urine turns a dark color or changes to pink, red, or brown. Document Released: 09/06/2000 Document Revised: 12/02/2011 Document Reviewed: 04/25/2008 ExitCare Patient Information 2014 [place].  _______________________________________________________________________  Incentive Spirometer  An incentive spirometer is a tool that can help keep your lungs clear and active. This tool measures how well you are filling your lungs with each breath. Taking long deep breaths may help reverse or decrease the chance of developing breathing (pulmonary) problems (especially infection) following:  A long period of time when you are unable to move or be active. BEFORE THE PROCEDURE   If the spirometer includes an indicator to show your best effort, your nurse or respiratory therapist will set it to a desired goal.  If possible, sit up straight or lean slightly forward. Try not to slouch.  Hold the incentive spirometer in an upright position. INSTRUCTIONS FOR USE  3. Sit on the edge of your bed if possible, or sit up as far as you can in bed or on a chair. 4. Hold the incentive spirometer in an upright position. 5. Breathe out normally. 6. Place the mouthpiece in your mouth and  seal your lips tightly around it. 7. Breathe in slowly and as deeply as possible, raising the piston or the ball toward the top of the column. 8. Hold your breath for 3-5 seconds or for as long as possible. Allow the piston or ball to fall to the bottom of the column. 9. Remove the mouthpiece from your mouth and breathe out normally. 10. Rest for a few seconds and repeat Steps 1 through 7 at least 10 times every 1-2 hours when you are awake. Take your time and take a few normal breaths between deep breaths. 11. The spirometer may include an indicator to show your best effort. Use the indicator as a goal to work toward during each repetition. 12. After each set of 10 deep breaths, practice coughing to be sure your lungs are clear. If you have an incision (the cut made at the time of surgery), support your incision when coughing by placing a pillow or rolled up towels firmly against it. Once you are able to get out of bed, walk around indoors and cough well. You may stop using the incentive spirometer when instructed by your caregiver.  RISKS AND COMPLICATIONS  Take your time so you do not get dizzy or light-headed.  If you are in pain, you may need to take or ask for pain medication before doing incentive spirometry. It is harder to take a deep breath if you are having pain. AFTER USE  Rest and breathe slowly and easily.  It can be helpful to keep track of a log of your progress. Your caregiver can provide you with a simple table to help with this. If you are using the spirometer at home, follow these instructions: [place] IF:   You are having difficultly using the spirometer.  You have trouble using the spirometer as often as instructed.  Your pain medication is not giving enough relief while using the spirometer.  You develop fever of 100.5 F (38.1 C) or higher. [place]  IF:   You cough up bloody sputum that had not been present before.  You develop  fever of 102 F (38.9 C) or greater.  You develop worsening pain at or near the incision site. MAKE SURE YOU:   Understand these instructions.  Will watch your condition.  Will get help right away if you are not doing well or get worse. Document Released: 01/20/2007 Document Revised: 12/02/2011 Document Reviewed: 03/23/2007 ExitCare Patient Information 2014 ExitCare, [place].   ________________________________________________________________________ Eat a light diet the day before surgery.  Examples including soups, broths, toast, yogurt, mashed potatoes.  Things to avoid include carbonated beverages (fizzy beverages), raw fruits and raw vegetables, or beans.   If your bowels are filled with gas, your surgeon will have difficulty visualizing your pelvic organs which increases your surgical risks.   CLEAR LIQUID DIET   Foods Allowed                                                                     Foods Excluded  Coffee and tea, regular and decaf                             liquids that you cannot  Plain Jell-O in any flavor                                             see through such as: Fruit ices (not with fruit pulp)                                     milk, soups, orange juice  Iced Popsicles                                    All solid food Carbonated beverages, regular and diet                                    Cranberry, grape and apple juices Sports drinks like Gatorade Lightly seasoned clear broth or consume(fat free) Sugar, honey syrup  Sample Menu Breakfast                                Lunch                                     Supper Cranberry juice                    Beef broth                            Chicken broth Jell-O  Grape juice                           Apple juice Coffee or tea                        Jell-O                                      Popsicle                                                Coffee or tea                         Coffee or tea  _____________________________________________________________________

## 2016-01-23 LAB — HEMOGLOBIN A1C
Hgb A1c MFr Bld: 8.5 % — ABNORMAL HIGH (ref 4.8–5.6)
Mean Plasma Glucose: 197 mg/dL

## 2016-01-23 NOTE — Progress Notes (Signed)
heamglobin A 1 C results routed to [facility] np inbasket by epic

## 2016-01-24 ENCOUNTER — Telehealth: Payer: Self-pay

## 2016-01-24 NOTE — Telephone Encounter (Signed)
Patient's call returned , patient states that she is experiencing "burning" to her vulva and is requesting recommendations . Melissa Cross ,APNP updated , recommendations are:  Sitz bath as needed and the patient should use a squirt bottle during urination to dilute the urine to prevent burning . Patient should also be sure to dry the site and apply Desitin ointment to effected area and call the office if this does not help. The patient patient was contacted and updated with recommendations , patient states understanding . Patient to come to the [facility] to pick up the sitz bath and squirt bottle later this afternoon.

## 2016-01-25 ENCOUNTER — Telehealth: Payer: Self-pay

## 2016-01-25 NOTE — Telephone Encounter (Signed)
Orders received from [facility] to contact the patient to update with results from NM PET Image (PI) Skull base to Thigh : "no signs of distant disease" Patient contacted and updated , patient states understanding , denies further questions at this time.

## 2016-01-25 NOTE — Progress Notes (Signed)
SPOKE WITH PATIENT BY PHONE AWARE SURGERY TIME CHANGED TO 730 AM, ARRIVE 530 AM 02-01-16 [facility] SHORT STAY, NPO AFTER MIDNIGHT Wednesday.

## 2016-01-31 NOTE — Anesthesia Preprocedure Evaluation (Addendum)
Anesthesia Evaluation  Patient identified by MRN, date of birth, ID band Patient awake    Reviewed: Allergy & Precautions, H&P , Patient's Chart, lab work & pertinent test results, reviewed documented beta blocker date and time   Airway Mallampati: II  TM Distance: >3 FB Neck ROM: full    Dental no notable dental hx. (+)    Pulmonary    Pulmonary exam normal breath sounds clear to auscultation       Cardiovascular  Rhythm:regular Rate:Normal     Neuro/Psych    GI/Hepatic   Endo/Other  diabetes, Insulin Dependent  Renal/GU      Musculoskeletal   Abdominal   Peds  Hematology   Anesthesia Other Findings DM; Last insulin last night( 1/2 usual dose) .... Am BS 93  Reproductive/Obstetrics                            Anesthesia Physical Anesthesia Plan  ASA: III  Anesthesia Plan: General   Post-op Pain Management:    Induction: Intravenous  Airway Management Planned: Oral ETT  Additional Equipment:   Intra-op Plan:   Post-operative Plan: Extubation in OR  Informed Consent: I have reviewed the patients History and Physical, chart, labs and discussed the procedure including the risks, benefits and alternatives for the proposed anesthesia with the patient or authorized representative who has indicated his/her understanding and acceptance.   Dental Advisory Given and Dental advisory given  Plan Discussed with: CRNA and Surgeon  Anesthesia Plan Comments: (  Discussed general anesthesia, including possible nausea, instrumentation of airway, sore throat,pulmonary aspiration, etc. I asked if the were any outstanding questions, or  concerns before we proceeded. )        Anesthesia Quick Evaluation

## 2016-02-01 ENCOUNTER — Inpatient Hospital Stay (HOSPITAL_COMMUNITY): Payer: Medicare Other | Admitting: Anesthesiology

## 2016-02-01 ENCOUNTER — Ambulatory Visit (HOSPITAL_COMMUNITY)
Admission: RE | Admit: 2016-02-01 | Discharge: 2016-02-02 | Disposition: A | Discharge status: Home / Self Care | Payer: Medicare Other | Source: Ambulatory Visit | Attending: Gynecologic Oncology | Admitting: Gynecologic Oncology

## 2016-02-01 ENCOUNTER — Encounter (HOSPITAL_COMMUNITY)
Admission: RE | Disposition: A | Discharge status: Home / Self Care | Payer: Self-pay | Source: Ambulatory Visit | Attending: Gynecologic Oncology

## 2016-02-01 ENCOUNTER — Encounter (HOSPITAL_COMMUNITY): Payer: Self-pay | Admitting: *Deleted

## 2016-02-01 DIAGNOSIS — Z853 Personal history of malignant neoplasm of breast: Secondary | ICD-10-CM | POA: Diagnosis not present

## 2016-02-01 DIAGNOSIS — Z794 Long term (current) use of insulin: Secondary | ICD-10-CM | POA: Insufficient documentation

## 2016-02-01 DIAGNOSIS — I10 Essential (primary) hypertension: Secondary | ICD-10-CM | POA: Diagnosis not present

## 2016-02-01 DIAGNOSIS — Z6841 Body Mass Index (BMI) 40.0 and over, adult: Secondary | ICD-10-CM | POA: Insufficient documentation

## 2016-02-01 DIAGNOSIS — E119 Type 2 diabetes mellitus without complications: Secondary | ICD-10-CM | POA: Diagnosis not present

## 2016-02-01 DIAGNOSIS — C519 Malignant neoplasm of vulva, unspecified: Secondary | ICD-10-CM | POA: Diagnosis not present

## 2016-02-01 DIAGNOSIS — N9089 Other specified noninflammatory disorders of vulva and perineum: Secondary | ICD-10-CM

## 2016-02-01 DIAGNOSIS — Z7982 Long term (current) use of aspirin: Secondary | ICD-10-CM | POA: Diagnosis not present

## 2016-02-01 DIAGNOSIS — Z9221 Personal history of antineoplastic chemotherapy: Secondary | ICD-10-CM | POA: Insufficient documentation

## 2016-02-01 DIAGNOSIS — Z923 Personal history of irradiation: Secondary | ICD-10-CM | POA: Insufficient documentation

## 2016-02-01 DIAGNOSIS — Z79899 Other long term (current) drug therapy: Secondary | ICD-10-CM | POA: Insufficient documentation

## 2016-02-01 HISTORY — PX: RADICAL VULVECTOMY: SHX6584

## 2016-02-01 LAB — TYPE AND SCREEN
ABO/RH(D): O POS
Antibody Screen: NEGATIVE

## 2016-02-01 LAB — GLUCOSE, CAPILLARY
Glucose-Capillary: 93 mg/dL (ref 65–99)
Glucose-Capillary: 111 mg/dL — ABNORMAL HIGH (ref 65–99)
Glucose-Capillary: 122 mg/dL — ABNORMAL HIGH (ref 65–99)
Glucose-Capillary: 175 mg/dL — ABNORMAL HIGH (ref 65–99)
Glucose-Capillary: 187 mg/dL — ABNORMAL HIGH (ref 65–99)

## 2016-02-01 SURGERY — VULVECTOMY, RADICAL
Anesthesia: General

## 2016-02-01 MED ORDER — ONDANSETRON HCL 4 MG/2ML IJ SOLN
INTRAMUSCULAR | Status: DC | PRN
Start: 2016-02-01 — End: 2016-02-01
  Administered 2016-02-01: 4 mg via INTRAVENOUS

## 2016-02-01 MED ORDER — CEFAZOLIN SODIUM-DEXTROSE 2-4 GM/100ML-% IV SOLN
2.0000 g | INTRAVENOUS | Status: AC
  Administered 2016-02-01: 2 g via INTRAVENOUS
  Filled 2016-02-01: qty 100

## 2016-02-01 MED ORDER — LABETALOL HCL 5 MG/ML IV SOLN
INTRAVENOUS | Status: DC | PRN
  Administered 2016-02-01: 5 mg via INTRAVENOUS

## 2016-02-01 MED ORDER — FENTANYL CITRATE (PF) 100 MCG/2ML IJ SOLN: 25.0000 ug | INTRAMUSCULAR | Status: DC | PRN

## 2016-02-01 MED ORDER — ACETIC ACID 5 % SOLN: 1.0000 "application " | Freq: Once | Status: DC

## 2016-02-01 MED ORDER — MIDAZOLAM HCL 5 MG/5ML IJ SOLN
INTRAMUSCULAR | Status: DC | PRN
Start: 2016-02-01 — End: 2016-02-01
  Administered 2016-02-01 (×2): 1 mg via INTRAVENOUS

## 2016-02-01 MED ORDER — INSULIN LISPRO PROT & LISPRO (75-25 MIX) 100 UNIT/ML KWIKPEN: 18.0000 [IU] | PEN_INJECTOR | Freq: Two times a day (BID) | SUBCUTANEOUS | Status: DC

## 2016-02-01 MED ORDER — OXYCODONE-ACETAMINOPHEN 5-325 MG PO TABS: 1.0000 | ORAL_TABLET | ORAL | Status: DC | PRN

## 2016-02-01 MED ORDER — ACETIC ACID 5 % SOLN: Freq: Once | Status: DC

## 2016-02-01 MED ORDER — IBUPROFEN 800 MG PO TABS: 800.0000 mg | ORAL_TABLET | Freq: Three times a day (TID) | ORAL | Status: DC | PRN

## 2016-02-01 MED ORDER — INSULIN LISPRO PROT & LISPRO (75-25 MIX) 100 UNIT/ML ~~LOC~~ SUSP
20.0000 [IU] | Freq: Every day | SUBCUTANEOUS | Status: DC
  Administered 2016-02-02: 20 [IU] via SUBCUTANEOUS
  Filled 2016-02-01: qty 10

## 2016-02-01 MED ORDER — ONDANSETRON HCL 4 MG PO TABS: 4.0000 mg | ORAL_TABLET | Freq: Four times a day (QID) | ORAL | Status: DC | PRN

## 2016-02-01 MED ORDER — SUFENTANIL CITRATE 50 MCG/ML IV SOLN
INTRAVENOUS | Status: DC | PRN
  Administered 2016-02-01: 15 ug via INTRAVENOUS
  Administered 2016-02-01 (×3): 10 ug via INTRAVENOUS

## 2016-02-01 MED ORDER — MENTHOL 3 MG MT LOZG
1.0000 | LOZENGE | OROMUCOSAL | Status: DC | PRN
  Filled 2016-02-01: qty 9

## 2016-02-01 MED ORDER — LABETALOL HCL 5 MG/ML IV SOLN
INTRAVENOUS | Status: AC
  Filled 2016-02-01: qty 4

## 2016-02-01 MED ORDER — LIDOCAINE HCL (CARDIAC) 20 MG/ML IV SOLN
INTRAVENOUS | Status: AC
  Filled 2016-02-01: qty 5

## 2016-02-01 MED ORDER — ROCURONIUM BROMIDE 100 MG/10ML IV SOLN
INTRAVENOUS | Status: DC | PRN
  Administered 2016-02-01: 40 mg via INTRAVENOUS

## 2016-02-01 MED ORDER — CEFAZOLIN SODIUM-DEXTROSE 2-4 GM/100ML-% IV SOLN
INTRAVENOUS | Status: AC
  Filled 2016-02-01: qty 100

## 2016-02-01 MED ORDER — SUGAMMADEX SODIUM 200 MG/2ML IV SOLN
INTRAVENOUS | Status: AC
  Filled 2016-02-01: qty 2

## 2016-02-01 MED ORDER — LINAGLIPTIN 5 MG PO TABS
5.0000 mg | ORAL_TABLET | Freq: Every day | ORAL | Status: DC
  Administered 2016-02-02: 5 mg via ORAL
  Filled 2016-02-01: qty 1

## 2016-02-01 MED ORDER — LIDOCAINE-EPINEPHRINE 2 %-1:100000 IJ SOLN
INTRAMUSCULAR | Status: AC
  Filled 2016-02-01: qty 1

## 2016-02-01 MED ORDER — HYDROMORPHONE HCL 1 MG/ML IJ SOLN
INTRAMUSCULAR | Status: DC | PRN
  Administered 2016-02-01: 0.5 mg via INTRAVENOUS

## 2016-02-01 MED ORDER — INSULIN LISPRO PROT & LISPRO (75-25 MIX) 100 UNIT/ML ~~LOC~~ SUSP
18.0000 [IU] | Freq: Every day | SUBCUTANEOUS | Status: DC
  Administered 2016-02-01: 18 [IU] via SUBCUTANEOUS

## 2016-02-01 MED ORDER — BUPIVACAINE HCL (PF) 0.25 % IJ SOLN
INTRAMUSCULAR | Status: AC
  Filled 2016-02-01: qty 30

## 2016-02-01 MED ORDER — SUFENTANIL CITRATE 50 MCG/ML IV SOLN
INTRAVENOUS | Status: AC
  Filled 2016-02-01: qty 1

## 2016-02-01 MED ORDER — HYDROMORPHONE HCL 2 MG/ML IJ SOLN
INTRAMUSCULAR | Status: AC
  Filled 2016-02-01: qty 1

## 2016-02-01 MED ORDER — SUGAMMADEX SODIUM 200 MG/2ML IV SOLN
INTRAVENOUS | Status: DC | PRN
  Administered 2016-02-01: 200 mg via INTRAVENOUS

## 2016-02-01 MED ORDER — LACTATED RINGERS IV SOLN
INTRAVENOUS | Status: DC | PRN
  Administered 2016-02-01: 1000 mL
  Administered 2016-02-01 (×2): via INTRAVENOUS

## 2016-02-01 MED ORDER — INSULIN ASPART 100 UNIT/ML ~~LOC~~ SOLN
0.0000 [IU] | Freq: Three times a day (TID) | SUBCUTANEOUS | Status: DC
  Administered 2016-02-02 (×2): 1 [IU] via SUBCUTANEOUS

## 2016-02-01 MED ORDER — ACETAMINOPHEN 10 MG/ML IV SOLN
INTRAVENOUS | Status: AC
  Filled 2016-02-01: qty 100

## 2016-02-01 MED ORDER — KCL IN DEXTROSE-NACL 20-5-0.45 MEQ/L-%-% IV SOLN
INTRAVENOUS | Status: DC
  Administered 2016-02-01: 12:00:00 via INTRAVENOUS
  Filled 2016-02-01 (×2): qty 1000

## 2016-02-01 MED ORDER — ACETIC ACID 5 % SOLN
1.0000 "application " | Freq: Once | Status: DC
  Filled 2016-02-01: qty 500

## 2016-02-01 MED ORDER — SODIUM CHLORIDE 0.9 % IJ SOLN
INTRAMUSCULAR | Status: AC
  Filled 2016-02-01: qty 10

## 2016-02-01 MED ORDER — HYDROMORPHONE HCL 1 MG/ML IJ SOLN
0.2000 mg | INTRAMUSCULAR | Status: DC | PRN
  Administered 2016-02-01 – 2016-02-02 (×3): 0.5 mg via INTRAVENOUS
  Filled 2016-02-01 (×3): qty 1

## 2016-02-01 MED ORDER — ONDANSETRON HCL 4 MG/2ML IJ SOLN
INTRAMUSCULAR | Status: AC
  Filled 2016-02-01: qty 2

## 2016-02-01 MED ORDER — DEXAMETHASONE SODIUM PHOSPHATE 10 MG/ML IJ SOLN
INTRAMUSCULAR | Status: AC
  Filled 2016-02-01: qty 1

## 2016-02-01 MED ORDER — PROPOFOL 10 MG/ML IV BOLUS
INTRAVENOUS | Status: DC | PRN
  Administered 2016-02-01: 150 mg via INTRAVENOUS

## 2016-02-01 MED ORDER — ASPIRIN EC 81 MG PO TBEC
81.0000 mg | DELAYED_RELEASE_TABLET | Freq: Every day | ORAL | Status: DC
  Administered 2016-02-02: 81 mg via ORAL
  Filled 2016-02-01: qty 1

## 2016-02-01 MED ORDER — PROPOFOL 10 MG/ML IV BOLUS
INTRAVENOUS | Status: AC
  Filled 2016-02-01: qty 40

## 2016-02-01 MED ORDER — ONDANSETRON HCL 4 MG/2ML IJ SOLN: 4.0000 mg | Freq: Four times a day (QID) | INTRAMUSCULAR | Status: DC | PRN

## 2016-02-01 MED ORDER — LIDOCAINE HCL (CARDIAC) 20 MG/ML IV SOLN
INTRAVENOUS | Status: DC | PRN
  Administered 2016-02-01: 75 mg via INTRAVENOUS
  Administered 2016-02-01: 25 mg via INTRATRACHEAL

## 2016-02-01 MED ORDER — MIDAZOLAM HCL 2 MG/2ML IJ SOLN
INTRAMUSCULAR | Status: AC
  Filled 2016-02-01: qty 4

## 2016-02-01 MED ORDER — ROCURONIUM BROMIDE 50 MG/5ML IV SOLN
INTRAVENOUS | Status: AC
  Filled 2016-02-01: qty 1

## 2016-02-01 MED ORDER — ENOXAPARIN SODIUM 40 MG/0.4ML ~~LOC~~ SOLN
40.0000 mg | SUBCUTANEOUS | Status: AC
  Administered 2016-02-01: 40 mg via SUBCUTANEOUS
  Filled 2016-02-01: qty 0.4

## 2016-02-01 MED ORDER — ACETAMINOPHEN 10 MG/ML IV SOLN
INTRAVENOUS | Status: DC | PRN
  Administered 2016-02-01: 1000 mg via INTRAVENOUS

## 2016-02-01 SURGICAL SUPPLY — 38 items
BLADE SURG 15 STRL LF DISP TIS (BLADE) ×2 IMPLANT
BLADE SURG 15 STRL SS (BLADE) ×4
COVER SURGICAL LIGHT HANDLE (MISCELLANEOUS) ×2 IMPLANT
DRAPE SURG IRRIG POUCH 19X23 (DRAPES) ×2 IMPLANT
DRSG TELFA 3X8 NADH (GAUZE/BANDAGES/DRESSINGS) ×2 IMPLANT
ELECT PENCIL ROCKER SW 15FT (MISCELLANEOUS) ×2 IMPLANT
GAUZE SPONGE 4X4 12PLY STRL (GAUZE/BANDAGES/DRESSINGS) ×2 IMPLANT
GAUZE SPONGE 4X4 16PLY XRAY LF (GAUZE/BANDAGES/DRESSINGS) ×4 IMPLANT
GLOVE BIO SURGEON STRL SZ 6 (GLOVE) ×4 IMPLANT
GOWN STRL REUS W/ TWL LRG LVL3 (GOWN DISPOSABLE) ×2 IMPLANT
GOWN STRL REUS W/TWL LRG LVL3 (GOWN DISPOSABLE) ×4
KIT BASIN OR (CUSTOM PROCEDURE TRAY) ×2 IMPLANT
NDL SPNL 22GX7 QUINCKE BK (NEEDLE) ×1 IMPLANT
NEEDLE HYPO 22GX1.5 SAFETY (NEEDLE) ×2 IMPLANT
NEEDLE SPNL 22GX7 QUINCKE BK (NEEDLE) ×2 IMPLANT
NS IRRIG 1000ML POUR BTL (IV SOLUTION) ×1 IMPLANT
PACK LITHOTOMY IV (CUSTOM PROCEDURE TRAY) ×2 IMPLANT
PACK PERINEAL COLD (PAD) ×5 IMPLANT
PAD DRESSING TELFA 3X8 NADH (GAUZE/BANDAGES/DRESSINGS) ×1 IMPLANT
SCOPETTES 8  STERILE (MISCELLANEOUS) ×1
SCOPETTES 8 STERILE (MISCELLANEOUS) ×1 IMPLANT
SHEARS HARMONIC 9CM CVD (BLADE) ×1 IMPLANT
SUT VIC AB 0 CT1 27 (SUTURE) ×2
SUT VIC AB 0 CT1 27XBRD ANTBC (SUTURE) ×1 IMPLANT
SUT VIC AB 2-0 SH 27 (SUTURE) ×8
SUT VIC AB 2-0 SH 27X BRD (SUTURE) IMPLANT
SUT VIC AB 3-0 SH 27 (SUTURE) ×14
SUT VIC AB 3-0 SH 27XBRD (SUTURE) ×2 IMPLANT
SUT VIC AB 4-0 P2 18 (SUTURE) ×12 IMPLANT
SYR BULB IRRIGATION 50ML (SYRINGE) ×2 IMPLANT
SYR CONTROL 10ML LL (SYRINGE) ×2 IMPLANT
TOWEL OR 17X26 10 PK STRL BLUE (TOWEL DISPOSABLE) ×2 IMPLANT
TOWEL OR NON WOVEN STRL DISP B (DISPOSABLE) ×2 IMPLANT
TRAY FOLEY W/METER SILVER 14FR (SET/KITS/TRAYS/PACK) ×4 IMPLANT
TRAY FOLEY W/METER SILVER 16FR (SET/KITS/TRAYS/PACK) ×4 IMPLANT
UNDERPAD 30X30 INCONTINENT (UNDERPADS AND DIAPERS) ×2 IMPLANT
WATER STERILE IRR 1500ML POUR (IV SOLUTION) ×1 IMPLANT
YANKAUER SUCT BULB TIP NO VENT (SUCTIONS) ×2 IMPLANT

## 2016-02-01 NOTE — Interval H&P Note (Signed)
History and Physical Interval Note:  02/01/2016 7:13 AM  [name]  has presented today for surgery, with the diagnosis of VULVAR CANCER  The various methods of treatment have been discussed with the patient and family. After consideration of risks, benefits and other options for treatment, the patient has consented to  Procedure(s): RADICAL VULVECTOMY (N/A) as a surgical intervention .  The patient's history has been reviewed, patient examined, no change in status, stable for surgery.  I have reviewed the patient's chart and labs.  Questions were answered to the patient's satisfaction.  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     Donaciano Eva

## 2016-02-01 NOTE — Anesthesia Postprocedure Evaluation (Signed)
Anesthesia Post Note  Patient: [name]  Procedure(s) Performed: Procedure(s) (LRB): RADICAL VULVECTOMY (N/A)  Patient location during evaluation: PACU Anesthesia Type: General Level of consciousness: sedated Pain management: satisfactory to patient Vital Signs Assessment: post-procedure vital signs reviewed and stable Respiratory status: spontaneous breathing Cardiovascular status: stable Anesthetic complications: no    Last Vitals:  Filed Vitals:   02/01/16 1030 02/01/16 1052  BP:  131/60  Pulse:  68  Temp: 36.7 C 36.6 C  Resp:      Last Pain:  Filed Vitals:   02/01/16 1054  PainSc: 0-No pain                 Marqueze Ramcharan EDWARD

## 2016-02-01 NOTE — Op Note (Signed)
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   Anesthesia: General   Estimated blood loss: [id number]  Complications: None   Pathology: right posterior vulva with marking stitch at 12 o'clock midline vaginal introitus  Operative findings: 6cm exophytic vulvar mass on posterior labia minora (right) extending around the vaginal introitus with VIN type lesion on left posterior vaginal introitus.  Procedure: The patient was identified in the preoperative holding area. Informed consent was signed on the chart. Patient was seen history was reviewed and exam was performed.   The patient was then taken to the operating room and placed in the supine position with SCD hose on. General anesthesia was then induced without difficulty. She was then placed in the dorsolithotomy position. The perineum was prepped with Betadine. The vagina was prepped with Betadine. The patient was then draped after the prep was dried. A Foley catheter was inserted into the bladder under sterile conditions.  Timeout was performed the patient, procedure, antibiotic, allergy, and length of procedure. The lesion was identified and the marking pen was used to circumscribe the area with appropriate surgical margins. The 15 blade scalpel was used to make an incision through the skin circumferentially as marked. Blunt dissection was performed with the hemostat to reach the urogenital diaphragm. The bovie and harmonic device were used to resect the tumor mass from the underlying tissues.The right pudendal vessels were made hemostatic with bovie and  3-0 vicryl figure of 8 suture. After the specimen had been completely resected, it was oriented and marked at 12 o'clock midline vaginal introitus (anterior) with a 0-vicryl suture. The bovie was used to obtain hemostasis at the surgical bed. The subcutaneous tissues were irrigated and made hemostatic.   The deep dermal layer was approximated with 2-0 and 3-0vicryl mattress sutures to bring the skin edges into approximation and off tension. The wound was closed following langher's lines. The cutaneous layer was closed with interrupted 4-0 vicryl stitches and mattress sutures to ensure a tension free and hemostatic closure. The perineum was again irrigated. The foley remained in situ.  All instrument, suture, laparotomy, Ray-Tec, and needle counts were correct x2. The patient tolerated the procedure well and was taken recovery room in stable condition. This is [name] dictating an operative note on [name].  Donaciano Eva, MD

## 2016-02-01 NOTE — H&P (View-Only) (Signed)
Consult Note: Gyn-Onc  Consult was requested by Dr. Simona Huh for the evaluation of [name] 70 y.o. female  CC:  Chief Complaint  Patient presents with  . vulvar mass    Assessment/Plan:  Ms. [name]  is a 70 y.o.  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  It appears more consistent with an invasive vulvar cancer rather than dysplasia, therefore we re-biopsied today and will follow-up these results to confirm my clinical suspicions.  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 The lesion crosses the midline and therefore bilateral groin dissections are necessary. I discussed that due the the posterior nature of some of the lesion, acquiring negative margins will be challenging. I discussed that close or positive margins and positive lymph nodes would necessitate further therapy with radiation.   I discussed that she is at high risk for wound breakdown and separation due to the fact that she is obese (BMI 43) and diabetic. We discussed postoperative stay (24 hours) and postoperative wound care and limitations. I discussed she will be discharged With bilateral inguinal drains which will need to be evaluated at home for drainage and she will need to return to the clinic weekly for evaluation of these. We will remove the drains when output is minimal. I discussed risk associated with surgery including wound separation, cellulitis, neck testing fasciitis, reoperation, damage today's in structures, fecal or urinary incontinence, lymphedema of the lower extremity, recurrence.  Surgery is currently scheduled for May 11. If the biopsy returns as dysplasia rather than malignancy we will perform a less radical procedure to confirm malignancy prior to proceeding with a radical vulvectomy. HPI: [name] is a very pleasant  70 year old G2 P2 who is seen in consultation at the request of Dr. Simona Huh for a vaginal mass.  The patient will reports feeling a "knot" in her vagina for the past 3 months. She was seen by her primary care doctor who then referred her to Dr. Simona Huh on April 12th, 2017 where a lesion was identified in the distal vagina/vulvar on the right and was biopsied. 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 The fragments were superficial but no invasion was identified.  The patient is otherwise fairly healthy. She is morbidly obese with a BMI of 42 kg/m. She has diabetes mellitus hyperlipidemia and hypertension. She is a remote history of breast cancer in February 2006 treated with surgery, chemotherapy, and radiation. She has long-standing left upper tremulous edema due to this treatment. Her only prior abdominal surgery was a cesarean section. She has a history of a vaginal hysterectomy for fibroids and abnormal bleeding in 1995. She denies history of cervical vaginal dysplasia. She is only had one sexual partner in her life who is her current Husband.  Current Meds:  Outpatient Encounter Prescriptions as of 01/10/2016  Medication Sig  . aspirin 81 MG tablet Take 81 mg by mouth daily.  . ergocalciferol (VITAMIN D2) 50000 UNITS capsule Take 50,000 Units by mouth once a week.    . Insulin Lispro Prot & Lispro (HUMALOG MIX 75/25 KWIKPEN) (75-25) 100 UNIT/ML Kwikpen   . [DISCONTINUED] atorvastatin (LIPITOR) 10 MG tablet Take 10 mg by mouth daily.    . [DISCONTINUED] insulin aspart protamine- aspart (NOVOLOG MIX 70/30) (70-30) 100 UNIT/ML injection Inject 18-20 Units into the skin 2 (two) times  daily with a meal. Takes 18 units in the morning and 20 units at night  . [DISCONTINUED] saxagliptin HCl (ONGLYZA) 5 MG TABS tablet Take 5 mg by mouth daily.   No facility-administered encounter medications on file as of 01/10/2016.    Allergy: No Known  Allergies  Social Hx:   Social History   Social History  . Marital Status: Married    Spouse Name: N/A  . Number of Children: N/A  . Years of Education: N/A   Occupational History  . Not on file.   Social History Main Topics  . Smoking status: Never Smoker   . Smokeless tobacco: Not on file  . Alcohol Use: No  . Drug Use: No  . Sexual Activity: Yes   Other Topics Concern  . Not on file   Social History Narrative    Past Surgical Hx:  Past Surgical History  Procedure Laterality Date  . Breast surgery      left breast lumpectomy   . Abdominal hysterectomy    . Eye surgery      removal of growth 35 years ago  . Hemorroidectomy      38 years ago, no present symtoms  . Colonoscopy with propofol N/A 07/24/2015    Procedure: COLONOSCOPY WITH PROPOFOL;  Surgeon: Garlan Fair, MD;  Location: [facility];  Service: Endoscopy;  Laterality: N/A;    Past Medical Hx:  Past Medical History  Diagnosis Date  . Hyperlipidemia   . Hypertension   . Diabetes mellitus   . Cancer [facility])     breast cancer 2005 surgery, chemo and radiation   . Breast cancer [facility])     Past Gynecological History:  SVD x 1 and C/s x 1, hysterectomy for fibroids (vaginal)  No LMP recorded.  Family Hx:  Family History  Problem Relation Age of Onset  . Cancer Maternal Grandmother     Review of Systems:  Constitutional  Feels well,   ENT Normal appearing ears and nares bilaterally Skin/Breast  No rash, sores, jaundice, itching, dryness Cardiovascular  No chest pain, shortness of breath, or edema  Pulmonary  No cough or wheeze.  Gastro Intestinal  No nausea, vomitting, or diarrhoea. No bright red blood per rectum, no abdominal pain, change in bowel movement, or constipation.  Genito Urinary  No frequency, urgency, dysuria, + vaginal bleeding and vaginal mass Musculo Skeletal  No myalgia, arthralgia, joint swelling or pain  Neurologic  No weakness, numbness, change in gait,   Psychology  No depression, anxiety, insomnia.   Vitals:  Blood pressure 174/91, pulse 74, temperature 97.9 F (36.6 C), temperature source Oral, resp. rate 18, height 5' (1.524 m), weight 217 lb 7 oz (98.629 kg).  Physical Exam: WD in NAD Neck  Supple NROM, without any enlargements.  Lymph Node Survey No cervical supraclavicular or inguinal adenopathy Cardiovascular  Pulse normal rate, regularity and rhythm. S1 and S2 normal.  Lungs  Clear to auscultation bilateraly, without wheezes/crackles/rhonchi. Good air movement.  Skin  No rash/lesions/breakdown  Psychiatry  Alert and oriented to person, place, and time  Abdomen  Normoactive bowel sounds, abdomen soft, non-tender and obese without evidence of hernia.  Back No CVA tenderness Genito Urinary  Vulva/vagina: There is a 6cm raised, firm lesion extending from the most anterior part of the right labia minora, posteriorally and crossing midline to left posterior vaginal introitus. It is somewhat exophytic and bleeds very easilty to touch. It was biopsied. >1cm from urethral meatus and anal sphincter.  Bladder/urethra:  No lesions or masses, well supported bladder  Vagina: Normal  Cervix: surgically absent  Uterus: surgically absent  Adnexa: no palpable masses. Rectal  Good tone, no masses no cul de sac nodularity.  Extremities  No bilateral cyanosis, clubbing or edema.  PROCEDURE NOTE: vulvar biopsy The bulk consent was obtained from the patient. The right labia minora was prepped with Betadine. 1 mL of 1% lidocaine was infiltrated into the anterior portion of the labia minora on the right. A 2 mm punch biopsy was taken to obtain tissue from the exophytic portion of the mass. Hemostasis was achieved with Monsel's apply topically. Patient tolerated procedure well. The specimen was sent for permanent pathology.  Donaciano Eva, MD  01/10/2016, 2:37 PM

## 2016-02-01 NOTE — Transfer of Care (Signed)
Immediate Anesthesia Transfer of Care Note  Patient: [name]  Procedure(s) Performed: Procedure(s): RADICAL VULVECTOMY (N/A)  Patient Location: PACU  Anesthesia Type:General  Level of Consciousness: awake, alert , oriented and patient cooperative  Airway & Oxygen Therapy: Patient Spontanous Breathing and Patient connected to face mask oxygen  Post-op Assessment: Report given to RN, Post -op Vital signs reviewed and stable and Patient moving all extremities X 4  Post vital signs: stable  Last Vitals:  Filed Vitals:   02/01/16 0613 02/01/16 0926  BP: 137/68   Pulse: 96   Temp: 37.1 C 36.9 C  Resp: 18     Last Pain: There were no vitals filed for this visit.       Complications: No apparent anesthesia complications

## 2016-02-01 NOTE — Anesthesia Procedure Notes (Signed)
Procedure Name: Intubation Date/Time: 02/01/2016 7:40 AM Performed by: Lissa Morales Pre-anesthesia Checklist: Patient identified, Emergency Drugs available, Suction available and Patient being monitored Patient Re-evaluated:Patient Re-evaluated prior to inductionOxygen Delivery Method: Circle System Utilized Preoxygenation: Pre-oxygenation with 100% oxygen Intubation Type: IV induction Ventilation: Mask ventilation without difficulty Laryngoscope Size: Mac and 4 Grade View: Grade I Tube type: Oral Tube size: 7.5 mm Number of attempts: 1 Airway Equipment and Method: Stylet and Oral airway Placement Confirmation: ETT inserted through vocal cords under direct vision,  positive ETCO2 and breath sounds checked- equal and bilateral Secured at: 21 cm Tube secured with: Tape Dental Injury: Teeth and Oropharynx as per pre-operative assessment

## 2016-02-02 DIAGNOSIS — C519 Malignant neoplasm of vulva, unspecified: Secondary | ICD-10-CM | POA: Diagnosis not present

## 2016-02-02 LAB — BASIC METABOLIC PANEL
Anion gap: 8 (ref 5–15)
BUN: 8 mg/dL (ref 6–20)
CO2: 26 mmol/L (ref 22–32)
Calcium: 9 mg/dL (ref 8.9–10.3)
Chloride: 107 mmol/L (ref 101–111)
Creatinine, Ser: 0.66 mg/dL (ref 0.44–1.00)
GFR calc Af Amer: >60 mL/min (ref >60)
GFR calc non Af Amer: >60 mL/min (ref >60)
Glucose, Bld: 142 mg/dL — ABNORMAL HIGH (ref 65–99)
Potassium: 4.7 mmol/L (ref 3.5–5.1)
Sodium: 141 mmol/L (ref 135–145)

## 2016-02-02 LAB — GLUCOSE, CAPILLARY
Glucose-Capillary: 141 mg/dL — ABNORMAL HIGH (ref 65–99)
Glucose-Capillary: 131 mg/dL — ABNORMAL HIGH (ref 65–99)

## 2016-02-02 LAB — CBC
HCT: 30.6 % — ABNORMAL LOW (ref 36.0–46.0)
Hemoglobin: 9.9 g/dL — ABNORMAL LOW (ref 12.0–15.0)
MCH: 28.1 pg (ref 26.0–34.0)
MCHC: 32.4 g/dL (ref 30.0–36.0)
MCV: 86.9 fL (ref 78.0–100.0)
Platelets: 241 10*3/uL (ref 150–400)
RBC: 3.52 MIL/uL — ABNORMAL LOW (ref 3.87–5.11)
RDW: 14.7 % (ref 11.5–15.5)
WBC: 13.1 10*3/uL — ABNORMAL HIGH (ref 4.0–10.5)

## 2016-02-02 MED ORDER — OXYCODONE-ACETAMINOPHEN 5-325 MG PO TABS: 1.0000 | ORAL_TABLET | ORAL | Status: DC | PRN

## 2016-02-02 NOTE — Discharge Summary (Signed)
Physician Discharge Summary  Patient ID: BENNETT KAUZLARICH MRN: [id number] DOB/AGE: Apr 22, 1946 70 y.o.  Admit date: 02/01/2016 Discharge date: 02/02/2016  Admission Diagnoses: Vulvar cancer [facility])  Discharge Diagnoses:  Principal Problem:   Vulvar cancer [facility]) Active Problems:   Vulval lesion   Discharged Condition:  The patient is in good condition and stable for discharge.    Hospital Course: On 02/01/2016, the patient underwent the following: Procedure(s): RADICAL VULVECTOMY.  The postoperative course was uneventful.  She was discharged to home on postoperative day 1 tolerating a regular diet, no pain reported, voiding, ambulating, passing flatus.  Consults: None  Significant Diagnostic Studies: None  Treatments: surgery: see above  Discharge Exam: Blood pressure 139/66, pulse 100, temperature 98.6 F (37 C), temperature source Oral, resp. rate 16, height 5' (1.524 m), weight 217 lb 3 oz (98.516 kg), SpO2 100 %. General appearance: alert, cooperative and no distress Resp: clear to auscultation bilaterally Cardio: regular rate and rhythm, S1, S2 normal, no murmur, click, rub or gallop GI: soft, non-tender; bowel sounds normal; no masses,  no organomegaly Extremities: extremities normal, atraumatic, no cyanosis or edema Incision/Wound: Vulvar incision intact with no erythema or drainage present  Disposition: Home      Discharge Instructions    Call MD for:  difficulty breathing, headache or visual disturbances    Complete by:  As directed      Call MD for:  extreme fatigue    Complete by:  As directed      Call MD for:  hives    Complete by:  As directed      Call MD for:  persistant dizziness or light-headedness    Complete by:  As directed      Call MD for:  persistant nausea and vomiting    Complete by:  As directed      Call MD for:  redness, tenderness, or signs of infection (pain, swelling, redness, odor or green/yellow discharge around incision site)     Complete by:  As directed      Call MD for:  severe uncontrolled pain    Complete by:  As directed      Call MD for:  temperature >100.4    Complete by:  As directed      Diet - low sodium heart healthy    Complete by:  As directed      Driving Restrictions    Complete by:  As directed   No driving for 1 week.  Do not take narcotics and drive.     Increase activity slowly    Complete by:  As directed      Lifting restrictions    Complete by:  As directed   No lifting greater than 10 lbs.     Sexual Activity Restrictions    Complete by:  As directed   No sexual activity, nothing in the vagina, for 4-6 weeks.            Medication List    TAKE these medications        aspirin 81 MG tablet  Take 81 mg by mouth daily.     ergocalciferol 50000 units capsule  Commonly known as:  VITAMIN D2  Take 50,000 Units by mouth every Monday.     glucose blood test strip  1 each by Other route as needed for other. Use as instructed     HUMALOG MIX 75/25 KWIKPEN (75-25) 100 UNIT/ML Kwikpen  Generic drug:  Insulin Lispro Prot &  Lispro  Inject 18-20 Units into the skin 2 (two) times daily. Takes 20 units in the morning and 18 units daily with supper.     oxyCODONE-acetaminophen 5-325 MG tablet  Commonly known as:  PERCOCET/ROXICET  Take 1-2 tablets by mouth every 4 (four) hours as needed (moderate to severe pain).     sitaGLIPtin 100 MG tablet  Commonly known as:  JANUVIA  Take 100 mg by mouth daily.       Follow-up Information    Follow up with Donaciano Eva, MD On 02/16/2016.   Specialty:  Obstetrics and Gynecology   Why:  at 3:15pm at the [facility] information:   [place] [address] [phone]       Greater than thirty minutes were spend for face to face discharge instructions and discharge orders/summary in EPIC.   Signed: Monaye Blackie DEAL 02/02/2016, 10:53 AM

## 2016-02-02 NOTE — Discharge Instructions (Signed)
02/02/2016  Return to work: 4-6 weeks if applicable  Activity: 1. Be up and out of the bed during the day.  Take a nap if needed.  You may walk up steps but be careful and use the hand rail.  Stair climbing will tire you more than you think, you may need to stop part way and rest.   2. No lifting or straining for 6 weeks.  3. No driving for 1 week(s).  Do not drive if you are taking narcotic pain medicine.  4. Shower daily.  Use soap and water on your incision and pat dry; don't rub.  No tub baths until cleared by your surgeon.   5. No sexual activity and nothing in the vagina for 4-6 weeks.  6. You may experience a small amount of clear drainage from your incision, which is normal.  If the drainage persists or increases, please call the office.  Diet: 1. Low sodium Heart Healthy Diet is recommended.  2. It is safe to use a laxative, such as Miralax or Colace, if you have difficulty moving your bowels.   Wound Care: 1. Keep clean and dry.  Shower daily.  Reasons to call the Doctor:  Fever - Oral temperature greater than 100.4 degrees Fahrenheit  Foul-smelling vaginal discharge  Difficulty urinating  Nausea and vomiting  Increased pain at the site of the incision that is unrelieved with pain medicine.  Difficulty breathing with or without chest pain  New calf pain especially if only on one side  Sudden, continuing increased vaginal bleeding with or without clots.   Contacts: For questions or concerns you should contact:  Dr. [name] at [phone]  Joylene John, NP at [phone]  After Hours: call [phone] and have the GYN Oncologist paged/contacted  Vulvectomy, Care After The vulva is the external female genitalia, outside and around the vagina and pubic bone. It consists of: 9. The skin on, and in front of, the pubic bone. 10. The clitoris. 11. The labia majora (large lips) on the outside of the vagina. 12. The labia minora (small lips) around the  opening of the vagina. 13. The opening and the skin in and around the vagina. A vulvectomy is the removal of the tissue of the vulva, which sometimes includes removal of the lymph nodes and tissue in the groin areas. These discharge instructions provide you with general information on caring for yourself after you leave the hospital. It is also important that you know the warning signs of complications, so that you can seek treatment. Please read the instructions outlined below and refer to this sheet in the next few weeks. Your caregiver may also give you specific information and medicines. If you have any questions or complications after discharge, please call your caregiver. ACTIVITY  Rest as much as possible the first two weeks after discharge.  Arrange to have help from family or others with your daily activities when you go home.  Avoid heavy lifting (more than 5 pounds), pushing, or pulling.  If you feel tired, balance your activity with rest periods.  Follow your caregiver's instruction about climbing stairs and driving a car.  Increase activity gradually.  Do not exercise until you have permission from your caregiver. LEG AND FOOT CARE If your doctor has removed lymph nodes from your groin area, there may be an increase in swelling of your legs and feet. You can help prevent swelling by doing the following:  Elevate your legs while sitting or lying down.  If  your caregiver has ordered special stockings, wear them according to instructions.  Avoid standing in one place for long periods of time.  Call the physical therapy department if you have any questions about swelling or treatment for swelling.  Avoid salt in your diet. It can cause fluid retention and swelling.  Do not Athene Schuhmacher your legs, especially when sitting. NUTRITION  You may resume your normal diet.  Drink 6 to 8 glasses of fluids a day.  Eat a healthy, balanced diet including portions of food from the meat  (protein), milk, fruit, vegetable, and bread groups.  Your caregiver may recommend you take a multivitamin with iron. ELIMINATION  You may notice that your stream of urine is at a different angle, and may tend to spray. Using a plastic funnel may help to decrease urine spray.  If constipation occurs, drink more liquids, and add more fruits, vegetables, and bran to your diet. You may take a mild laxative, such as Milk of Magnesia, Metamucil, or a stool softener such as Colace, with permission from your caregiver. HYGIENE  You may shower and wash your hair.  Check with your caregiver about tub baths.  Do not add any bath oils or chemicals to your bath water, after you have permission to take baths.  While passing urine, pour water from a bottle or spray over your vulva to dilute the urine as it passes the incision (this will decrease burning and discomfort).  Clean yourself well after moving your bowels.  After urinating, do not wipe. Dap or pat dry with toilet paper or a dry cleath soft cloth.  A sitz bath will help keep your perineal area clean, reduce swelling, and provide comfort.  Avoid wearing underpants for the first 2 weeks and wear loose skirts to allow circulation of air around the incision  You do not need to apply dressings, salves or lotions to the wound.  The stitches are self-dissolving and will absorb and disappear over a couple of months (it is normal to notice the knot from the stitches on toilet paper after voiding). HOME CARE INSTRUCTIONS   Apply a soft ice pack (or frozen bag of peas) to your perineum (vulva) every hour in the first 48 hours after surgery. This will reduce swelling.  Avoid activities that involve a lot of friction between your legs.  Avoid wearing pants or underpants in the 1st 2 weeks (skirts are preferable).  Take your temperature twice a day and record it, especially if you feel feverish or have chills.  Follow your caregiver's  instructions about medicines, activity, and follow-up appointments after surgery.  Do not drink alcohol while taking pain medicine.  Change your dressing as advised by your caregiver.  You may take over-the-counter medicine for pain, recommended by your caregiver.  If your pain is not relieved with medicine, call your caregiver.  Do not take aspirin because it can cause bleeding.  Do not douche or use tampons (use a nonperfumed sanitary pad).  Do not have sexual intercourse until your caregiver gives you permission (typically 6 weeks postoperatively). Hugging, kissing, and playful sexual activity is fine with your caregiver's permission.  Warm sitz baths, with your caregiver's permission, are helpful to control swelling and discomfort.  Take showers instead of baths, until your caregiver gives you permission to take baths.  You may take a mild medicine for constipation, recommended by your caregiver. Bran foods and drinking a lot of fluids will help with constipation.  Make sure your family understands everything  about your operation and recovery. SEEK MEDICAL CARE IF:   You notice swelling and redness around the wound area.  You notice a foul smell coming from the wound or on the surgical dressing.  You notice the wound is separating.  You have painful or bloody urination.  You develop nausea and vomiting.  You develop diarrhea.  You develop a rash.  You have a reaction or allergy from the medicine.  You feel dizzy or light-headed.  You need stronger pain medicine. SEEK IMMEDIATE MEDICAL CARE IF:   You develop a temperature of 102 F (38.9 C) or higher.  You pass out.  You develop leg or chest pain.  You develop abdominal pain.  You develop shortness of breath.  You develop bleeding from the wound area.  You see pus in the wound area. MAKE SURE YOU:   Understand these instructions.  Will watch your condition.  Will get help right away if you are not  doing well or get worse. Document Released: 04/23/2004 Document Revised: 01/24/2014 Document Reviewed: 08/11/2009 [facility] Patient Information 2015 [place], [place]. This information is not intended to replace advice given to you by your health care provider. Make sure you discuss any questions you have with your health care provider.  Acetaminophen; Oxycodone tablets What is this medicine? ACETAMINOPHEN; OXYCODONE (a set a MEE noe fen; ox i KOE done) is a pain reliever. It is used to treat moderate to severe pain. This medicine may be used for other purposes; ask your health care provider or pharmacist if you have questions. What should I tell my health care provider before I take this medicine? They need to know if you have any of these conditions: -brain tumor -Crohn's disease, inflammatory bowel disease, or ulcerative colitis -drug abuse or addiction -head injury -heart or circulation problems -if you often drink alcohol -kidney disease or problems going to the bathroom -liver disease -lung disease, asthma, or breathing problems -an unusual or allergic reaction to acetaminophen, oxycodone, other opioid analgesics, other medicines, foods, dyes, or preservatives -pregnant or trying to get pregnant -breast-feeding How should I use this medicine? Take this medicine by mouth with a full glass of water. Follow the directions on the prescription label. You can take it with or without food. If it upsets your stomach, take it with food. Take your medicine at regular intervals. Do not take it more often than directed. Talk to your pediatrician regarding the use of this medicine in children. Special care may be needed. Patients over 28 years old may have a stronger reaction and need a smaller dose. Overdosage: If you think you have taken too much of this medicine contact a poison control center or emergency room at once. NOTE: This medicine is only for you. Do not share this medicine with  others. What if I miss a dose? If you miss a dose, take it as soon as you can. If it is almost time for your next dose, take only that dose. Do not take double or extra doses. What may interact with this medicine? -alcohol -antihistamines -barbiturates like amobarbital, butalbital, butabarbital, methohexital, pentobarbital, phenobarbital, thiopental, and secobarbital -benztropine -drugs for bladder problems like solifenacin, trospium, oxybutynin, tolterodine, hyoscyamine, and methscopolamine -drugs for breathing problems like ipratropium and tiotropium -drugs for certain stomach or intestine problems like propantheline, homatropine methylbromide, glycopyrrolate, atropine, belladonna, and dicyclomine -general anesthetics like etomidate, ketamine, nitrous oxide, propofol, desflurane, enflurane, halothane, isoflurane, and sevoflurane -medicines for depression, anxiety, or psychotic disturbances -medicines for sleep -muscle relaxants -  naltrexone -narcotic medicines (opiates) for pain -phenothiazines like perphenazine, thioridazine, chlorpromazine, mesoridazine, fluphenazine, prochlorperazine, promazine, and trifluoperazine -scopolamine -tramadol -trihexyphenidyl This list may not describe all possible interactions. Give your health care provider a list of all the medicines, herbs, non-prescription drugs, or dietary supplements you use. Also tell them if you smoke, drink alcohol, or use illegal drugs. Some items may interact with your medicine. What should I watch for while using this medicine? Tell your doctor or health care professional if your pain does not go away, if it gets worse, or if you have new or a different type of pain. You may develop tolerance to the medicine. Tolerance means that you will need a higher dose of the medication for pain relief. Tolerance is normal and is expected if you take this medicine for a long time. Do not suddenly stop taking your medicine because you may  develop a severe reaction. Your body becomes used to the medicine. This does NOT mean you are addicted. Addiction is a behavior related to getting and using a drug for a non-medical reason. If you have pain, you have a medical reason to take pain medicine. Your doctor will tell you how much medicine to take. If your doctor wants you to stop the medicine, the dose will be slowly lowered over time to avoid any side effects. You may get drowsy or dizzy. Do not drive, use machinery, or do anything that needs mental alertness until you know how this medicine affects you. Do not stand or sit up quickly, especially if you are an older patient. This reduces the risk of dizzy or fainting spells. Alcohol may interfere with the effect of this medicine. Avoid alcoholic drinks. There are different types of narcotic medicines (opiates) for pain. If you take more than one type at the same time, you may have more side effects. Give your health care provider a list of all medicines you use. Your doctor will tell you how much medicine to take. Do not take more medicine than directed. Call emergency for help if you have problems breathing. The medicine will cause constipation. Try to have a bowel movement at least every 2 to 3 days. If you do not have a bowel movement for 3 days, call your doctor or health care professional. Do not take Tylenol (acetaminophen) or medicines that have acetaminophen with this medicine. Too much acetaminophen can be very dangerous. Many nonprescription medicines contain acetaminophen. Always read the labels carefully to avoid taking more acetaminophen. What side effects may I notice from receiving this medicine? Side effects that you should report to your doctor or health care professional as soon as possible: -allergic reactions like skin rash, itching or hives, swelling of the face, lips, or tongue -breathing difficulties, wheezing -confusion -light headedness or fainting spells -severe  stomach pain -unusually weak or tired -yellowing of the skin or the whites of the eyes Side effects that usually do not require medical attention (report to your doctor or health care professional if they continue or are bothersome): -dizziness -drowsiness -nausea -vomiting This list may not describe all possible side effects. Call your doctor for medical advice about side effects. You may report side effects to FDA at 1-800-FDA-1088. Where should I keep my medicine? Keep out of the reach of children. This medicine can be abused. Keep your medicine in a safe place to protect it from theft. Do not share this medicine with anyone. Selling or giving away this medicine is dangerous and against the law. This  medicine may cause accidental overdose and death if it taken by other adults, children, or pets. Mix any unused medicine with a substance like cat litter or coffee grounds. Then throw the medicine away in a sealed container like a sealed bag or a coffee can with a lid. Do not use the medicine after the expiration date. Store at room temperature between 20 and 25 degrees C (68 and 77 degrees F). NOTE: This sheet is a summary. It may not cover all possible information. If you have questions about this medicine, talk to your doctor, pharmacist, or health care provider.    2016, [employer]. (2014-08-10 15:18:46)

## 2016-02-06 ENCOUNTER — Telehealth: Payer: Self-pay | Admitting: Gynecologic Oncology

## 2016-02-06 NOTE — Telephone Encounter (Signed)
Informed patient of results of vulvar cancer.  Unclear of stage because lymph nodes not sampled (at patient's request). Informed her that best course of treatment is re-excision of lesion (due to close margin) and bilateral inguinal lymph node biopsy.  She was very very tearful because she does not want to undergo lymphadenectomy and develop lymphedema.  We discussed that not all patients experience lymphedema after lymphadenectomy.   I will see her back in office to schedule her surgery.  Donaciano Eva, MD

## 2016-02-08 ENCOUNTER — Encounter: Payer: Self-pay | Admitting: Gynecologic Oncology

## 2016-02-08 NOTE — Progress Notes (Signed)
Spoke with Sherri at [facility].  Stating she had left a message for the patient about her appt at [facility] on May 23 at 8 am with Dr. Thurston Pounds.  Stating she will re-attempt to contact the patient tomorrow.

## 2016-02-12 ENCOUNTER — Encounter: Payer: Self-pay | Admitting: Gynecologic Oncology

## 2016-02-12 NOTE — Progress Notes (Signed)
Gynecologic Oncology Multi-Disciplinary Disposition Conference Note  Date of the Conference: Feb 12, 2016  Patient Name: [name]  Referring Provider: Dr. Simona Huh Primary GYN Oncologist: Dr. [name]  Stage/Disposition:  Clinical Stage IB vulvar carcinoma.  Disposition is to re-excision of vulva for close margins with sentinel lymph node mapping at [facility].  MRI of the lumbar spine for evaluation of suspicious lumbar lesion on PET.   This Multidisciplinary conference took place involving physicians from [place], [place], Radiation Oncology, Pathology, Radiology along with the Gynecologic Oncology Nurse Practitioner and RN.  Comprehensive assessment of the patient's malignancy, staging, need for surgery, chemotherapy, radiation therapy, and need for further testing were reviewed. Supportive measures, both inpatient and following discharge were also discussed. The recommended plan of care is documented. Greater than 35 minutes were spent correlating and coordinating this patient's care.

## 2016-02-16 ENCOUNTER — Ambulatory Visit: Payer: Medicare Other | Admitting: Gynecologic Oncology

## 2016-03-14 ENCOUNTER — Ambulatory Visit: Payer: Medicare Other | Attending: Gynecologic Oncology | Admitting: Gynecologic Oncology

## 2016-03-14 ENCOUNTER — Encounter: Payer: Self-pay | Admitting: Gynecologic Oncology

## 2016-03-14 VITALS — BP 141/64 | HR 87 | Temp 98.6°F | Resp 18 | Ht 60.0 in | Wt 209.7 lb

## 2016-03-14 DIAGNOSIS — Z90710 Acquired absence of both cervix and uterus: Secondary | ICD-10-CM | POA: Diagnosis not present

## 2016-03-14 DIAGNOSIS — T8131XA Disruption of external operation (surgical) wound, not elsewhere classified, initial encounter: Secondary | ICD-10-CM

## 2016-03-14 DIAGNOSIS — I10 Essential (primary) hypertension: Secondary | ICD-10-CM | POA: Insufficient documentation

## 2016-03-14 DIAGNOSIS — C519 Malignant neoplasm of vulva, unspecified: Secondary | ICD-10-CM | POA: Diagnosis not present

## 2016-03-14 DIAGNOSIS — Z6841 Body Mass Index (BMI) 40.0 and over, adult: Secondary | ICD-10-CM | POA: Insufficient documentation

## 2016-03-14 DIAGNOSIS — E785 Hyperlipidemia, unspecified: Secondary | ICD-10-CM | POA: Diagnosis not present

## 2016-03-14 DIAGNOSIS — Z853 Personal history of malignant neoplasm of breast: Secondary | ICD-10-CM | POA: Diagnosis not present

## 2016-03-14 DIAGNOSIS — E119 Type 2 diabetes mellitus without complications: Secondary | ICD-10-CM | POA: Insufficient documentation

## 2016-03-14 NOTE — Progress Notes (Signed)
Follow-up Note: Gyn-Onc   [name] 70 y.o. female  CC:  Chief Complaint  Patient presents with  . Vulvar Cancer    Assessment/Plan:  Ms. [name]  is a 70 y.o.  year old with stage IB SCC of the vulva. Wound separation after radical vulvectomy.  The patient is s/p radical vulvectomy on 02/01/16 followed by re-excision and sentinel lymph node biopsy on 03/04/16.  Final pathology revealed negative SLN's bilaterally and <47mm focus of residual invasive carcinoma on the vulva (right vulvar re-excision) with 0.52mm depth of invasion and widely negative margins.  I recommended expectant management of her wound separation. There is no sign of infection. Counseled her regarding peri-care. Will see her back in 2 weeks for wound recheck.  Recommend 3 monthly evaluations/surveillance of her vulvar cancer beginning in 3 months, x 2 years.  HPI: [name] is a very pleasant 70 year old G2 P2 who was seen in consultation at the request of Dr. Simona Huh on 01/10/16 for a vaginal mass.   The patient will reported feeling a "knot" in her vagina for 3 months. She was seen by her primary care doctor who then referred her to Dr. Simona Huh on April 12th, 2017 where a lesion was identified in the distal vagina/vulvar on the right and was biopsied. 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 The fragments were superficial but no invasion was identified.  The patient had a biopsy in the office on 01/10/16 which showed VIN3, however, it was clinically more consistent with invasive malignancy. She declined agreeing to lymphadenectomy as she did not have a definitive cancer diagnosis.  Preoperative PET was negative for metastatic disease.  On 02/01/16 she underwent radical right vulvectomy. In the operating room it was apparent that there was extension of the right sided vulvar cancer across the perineal body to the left perineal body.  Final  pathology revealed invasive squamous cell carcinoma associated with high grade vulvar intraepithelial neoplasia. Margins were not involved by invasive carcinoma however there was a close (70mm) left margin.  She desired a minimally invasive lymph node assessment and was sent to [facility] for SLN biopsy and re-excision of the vulva with Dr Margaretmary Bayley which took place on 03/04/16. The pathology revealed a minute microscopic focus of residual invasive cancer measuring <3mm horizontal dimension and 0.80mm depth. There was VIN2 and 3 extending to the margins at 3 o'clock. SLN's were negative  She is morbidly obese with a BMI of 42 kg/m. She has diabetes mellitus hyperlipidemia and hypertension. She is a remote history of breast cancer in February 2006 treated with surgery, chemotherapy, and radiation. She has long-standing left upper tremulous edema due to this treatment. Her only prior abdominal surgery was a cesarean section. She has a history of a vaginal hysterectomy for fibroids and abnormal bleeding in 1995. She denies history of cervical vaginal dysplasia. She is only had one sexual partner in her life who is her current Husband.  Interval Hx: The patient developed some symptoms of bloody discharge on her underwear in the past 48 hours. She denies pain. She denies fevers.  Current Meds:  Outpatient Encounter Prescriptions as of 03/14/2016  Medication Sig  . aspirin 81 MG tablet Take 81 mg by mouth daily.  . ergocalciferol (VITAMIN D2) 50000 UNITS capsule Take 50,000 Units by mouth every Monday.   Marland Kitchen glucose blood test strip 1 each by Other route as needed for other. Use as instructed  .  Insulin Lispro Prot & Lispro (HUMALOG MIX 75/25 KWIKPEN) (75-25) 100 UNIT/ML Kwikpen Inject 18-20 Units into the skin 2 (two) times daily. Takes 20 units in the morning and 18 units daily with supper.  . silver sulfADIAZINE (SILVADENE) 1 % cream Apply topically.  . sitaGLIPtin (JANUVIA) 100 MG tablet Take 100  mg by mouth daily.  Marland Kitchen oxyCODONE-acetaminophen (PERCOCET/ROXICET) 5-325 MG tablet Take 1-2 tablets by mouth every 4 (four) hours as needed (moderate to severe pain). (Patient not taking: Reported on 03/14/2016)   No facility-administered encounter medications on file as of 03/14/2016.    Allergy:  Allergies  Allergen Reactions  . Duloxetine Itching    Cymbalta    Social Hx:   Social History   Social History  . Marital Status: Married    Spouse Name: N/A  . Number of Children: N/A  . Years of Education: N/A   Occupational History  . Not on file.   Social History Main Topics  . Smoking status: Never Smoker   . Smokeless tobacco: Never Used  . Alcohol Use: No  . Drug Use: No  . Sexual Activity: Yes   Other Topics Concern  . Not on file   Social History Narrative    Past Surgical Hx:  Past Surgical History  Procedure Laterality Date  . Hemorroidectomy      38 years ago, no present symtoms  . Colonoscopy with propofol N/A 07/24/2015    Procedure: COLONOSCOPY WITH PROPOFOL;  Surgeon: Garlan Fair, MD;  Location: [facility];  Service: Endoscopy;  Laterality: N/A;  . Breast surgery Left 2006    left breast lumpectomy , chemo and radiation done  . Abdominal hysterectomy      complete  . Eye surgery      removal of growth 35 years ago  . Radical vulvectomy N/A 02/01/2016    Procedure: RADICAL VULVECTOMY;  Surgeon: [name], MD;  Location: [facility] ORS;  Service: Gynecology;  Laterality: N/A;    Past Medical Hx:  Past Medical History  Diagnosis Date  . Diabetes mellitus   . Headache   . Cancer [facility]) left     breast cancer 2005 surgery, chemo and radiation   . Breast cancer ([place])   . Vulvar cancer ([place]) 2017 new dx    Past Gynecological History:  SVD x 1 and C/s x 1, hysterectomy for fibroids (vaginal)  No LMP recorded. Patient has had a hysterectomy.  Family Hx:  Family History  Problem Relation Age of Onset  . Cancer Maternal Grandmother     Review of  Systems:  Constitutional  Feels well,   ENT Normal appearing ears and nares bilaterally Skin/Breast  No rash, sores, jaundice, itching, dryness Cardiovascular  No chest pain, shortness of breath, or edema  Pulmonary  No cough or wheeze.  Gastro Intestinal  No nausea, vomitting, or diarrhoea. No bright red blood per rectum, no abdominal pain, change in bowel movement, or constipation.  Genito Urinary  No frequency, urgency, dysuria, + vaginal bleeding and discharge Musculo Skeletal  No myalgia, arthralgia, joint swelling or pain  Neurologic  No weakness, numbness, change in gait,  Psychology  No depression, anxiety, insomnia.   Vitals:  Blood pressure 141/64, pulse 87, temperature 98.6 F (37 C), temperature source Oral, resp. rate 18, height 5' (1.524 m), weight 209 lb 11.2 oz (95.119 kg), SpO2 100 %.  Physical Exam: WD in NAD Neck  Supple NROM, without any enlargements.  Lymph Node Survey Indurated incisions without erythema or infection  Cardiovascular  Pulse normal rate, regularity and rhythm. S1 and S2 normal.  Lungs  Clear to auscultation bilateraly, without wheezes/crackles/rhonchi. Good air movement.  Skin  No rash/lesions/breakdown  Psychiatry  Alert and oriented to person, place, and time  Abdomen  Normoactive bowel sounds, abdomen soft, non-tender and obese without evidence of hernia.  Back No CVA tenderness Genito Urinary  Vulva/vagina: The vulvar incision has separated and is gaping but granulating at the base. There is no surrounding erythema or induration consistent with infection.  Bladder/urethra:  No lesions or masses, well supported bladder  Vagina: Normal  Cervix: surgically absent  Uterus: surgically absent  Adnexa: no palpable masses. Rectal  Good tone, no masses no cul de sac nodularity.  Extremities  No bilateral cyanosis, clubbing or edema.   20 minutes of direct face to face counseling time was spent with the patient. This included  discussion about prognosis, therapy recommendations and postoperative side effects and are beyond the scope of routine postoperative care.  Sabrina Eva, MD  03/14/2016, 3:31 PM

## 2016-03-14 NOTE — Patient Instructions (Signed)
You have some wound separation of the vulva.  I recommend sitz baths 3 times a day.  I recommend rinsing off the perineum every time you toilet, and patting dry well with a wash cloth. Make sure you open up the folds.  Your cancer is a stage I cancer, and you have a 5% risk for recurrence, therefore we recommend follow-up checks every 3 months.  I will see you for return visit in 2 weeks to check on the wound

## 2016-03-29 ENCOUNTER — Ambulatory Visit: Payer: Medicare Other | Admitting: Gynecologic Oncology

## 2016-04-01 ENCOUNTER — Telehealth: Payer: Self-pay | Admitting: *Deleted

## 2016-04-01 ENCOUNTER — Ambulatory Visit: Payer: Medicare Other | Admitting: Gynecologic Oncology

## 2016-04-01 NOTE — Telephone Encounter (Signed)
I have an appt tomorrow with Dr. Charlotta Newton at [facility] so I need to cancel my appointment for today.

## 2017-03-20 IMAGING — PT NM PET TUM IMG INITIAL (PI) SKULL BASE T - THIGH
1 of 8 series · 1 of 25 positions shown · non-contrast
Comparison: CT abdomen 01/06/2009, CT chest 11/26/2010

CLINICAL DATA: Initial treatment strategy for vulvar carcinoma.
Clinically stage IB vular carcinoma (RIGHT labia minora)

EXAM:
NUCLEAR MEDICINE PET SKULL BASE TO THIGH
TECHNIQUE: 10.8 mCi F-18 FDG was injected intravenously. Full-ring PET imaging
was performed from the skull base to thigh after the radiotracer. CT
data was obtained and used for attenuation correction and anatomic
localization.
FASTING BLOOD GLUCOSE:  Value: 193 mg/dl

[Series 4: ct sk_thigh 5.0 hd_fov · axial · 5.0mm · 1.12mm/px · 1 of 208 slices shown]
[im 208/208  brain]
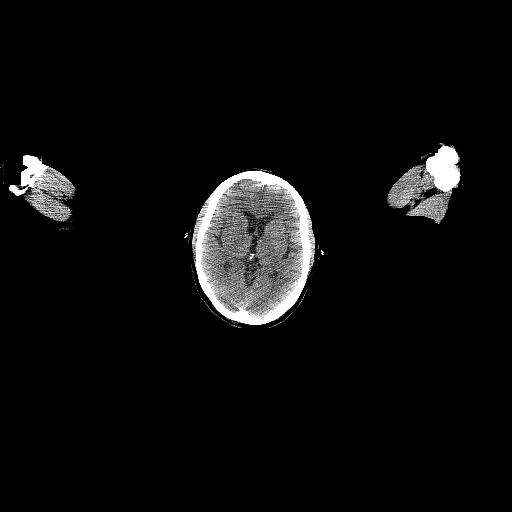

[1 of 25 positions shown; findings below may reference images not displayed]

FINDINGS: NECK

No hypermetabolic lymph nodes in the neck.

CHEST

No hypermetabolic mediastinal or hilar nodes. No suspicious
pulmonary nodules on the CT scan. Mild pleural thickening in the
LEFT upper lobe presumably relates to prior radiation therapy. There
are lymphadenectomy clips in the LEFT axilla surgical clips in the
LEFT breast.

ABDOMEN/PELVIS

No abnormal hypermetabolic activity within the liver, pancreas,
adrenal glands, or spleen. No hypermetabolic lymph nodes in the
abdomen or pelvis.

There is intense metabolic activity associated with the external
genitalia with SUV max equal 13.5.

SKELETON

Single small focus of moderate metabolic activity associated with
the RIGHT aspect of the L5 vertebral body. No lesion on the CT
portion to correspond this activity (image 128 of fused data set).
IMPRESSION: 1. Hypermetabolic activity associated with a external genitalia
consistent with vulvar carcinoma.
2. No hypermetabolic or enlarged pelvic lymph nodes. No evidence
distant metastatic disease.
3. Single focus of metabolic activity associated the L5 vertebral
body is indeterminate but unlikely related to vulvar carcinoma.
4. Changes in the LEFT chest wall and lung consistent with surgery
and radiation therapy for breast cancer.

## 2017-07-09 ENCOUNTER — Other Ambulatory Visit: Payer: Self-pay | Admitting: Radiology

## 2018-04-02 ENCOUNTER — Encounter

## 2018-04-02 ENCOUNTER — Ambulatory Visit: Payer: Medicare Other | Admitting: Podiatry

## 2018-04-02 ENCOUNTER — Encounter: Payer: Self-pay | Admitting: Podiatry

## 2018-04-02 VITALS — BP 152/74 | HR 42 | Resp 16

## 2018-04-02 DIAGNOSIS — L600 Ingrowing nail: Secondary | ICD-10-CM

## 2018-04-02 DIAGNOSIS — B351 Tinea unguium: Secondary | ICD-10-CM

## 2018-04-02 DIAGNOSIS — M79609 Pain in unspecified limb: Secondary | ICD-10-CM | POA: Diagnosis not present

## 2018-04-02 NOTE — Patient Instructions (Signed)

## 2018-04-02 NOTE — Progress Notes (Signed)
   Subjective:    Patient ID: [name], female    DOB: 09-22-1946, 72 y.o.   MRN: [id number]  HPI    Review of Systems  All other systems reviewed and are negative.      Objective:   Physical Exam        Assessment & Plan:

## 2018-04-02 NOTE — Progress Notes (Signed)
Subjective:   Patient ID: [name], female   DOB: 72 y.o.   MRN: [id number]   HPI Patient presents with severely elongated nailbeds 1-5 both feet with history of having had the big toenails removed of both feet that did recur gradually over time.  Patient states that they get thick painful and she cannot take care of them herself and patient does not smoke likes to be active   Review of Systems  All other systems reviewed and are negative.       Objective:  Physical Exam  Constitutional: She appears well-developed and well-nourished.  Cardiovascular: Intact distal pulses.  Pulmonary/Chest: Effort normal.  Musculoskeletal: Normal range of motion.  Neurological: She is alert.  Skin: Skin is warm.  Nursing note and vitals reviewed.   Neurovascular status found to be intact with muscle strength adequate range of motion within normal limits with patient found to have severely thickened dystrophic painful nailbeds 1-5 both feet with incurvation of the beds and significant elongation issue     Assessment:  Chronic mycotic nail infection 1-5 both feet with pain and chronic ingrown toenail with history of treatment which did not address the underlying condition     Plan:  H&P diabetic education rendered to patient as far as feet goes and today I debrided nailbeds 1-5 both feet and discussed with her possible removal in the future which may be necessary.  Patient will be seen back in 3 months for TRAM or earlier if any issues should occur

## 2018-04-06 ENCOUNTER — Ambulatory Visit: Payer: Medicare Other | Admitting: Podiatry

## 2018-04-06 ENCOUNTER — Encounter: Payer: Self-pay | Admitting: Podiatry

## 2018-04-06 DIAGNOSIS — B351 Tinea unguium: Secondary | ICD-10-CM

## 2018-04-06 DIAGNOSIS — M79609 Pain in unspecified limb: Secondary | ICD-10-CM | POA: Diagnosis not present

## 2018-04-06 NOTE — Patient Instructions (Signed)

## 2018-04-06 NOTE — Progress Notes (Signed)
Subjective:   Patient ID: [name], female   DOB: 72 y.o.   MRN: [id number]   HPI Patient presents stating he is too big toenails are really sore still despite the fact they were tried and I want them removed permanently.  Patient states her sugars been running very well with her average checks on a daily basis around 100 and I did note good circulatory status   ROS      Objective:  Physical Exam  Neurovascular status was found to be intact with good digital perfusion toes that are warm with very thickened dystrophic hallux nails bilateral that are moderately painful when pressed in the dorsal direction     Assessment:  Chronic nail disease hallux bilateral with thick dystrophic big toenails     Plan:  H&P condition reviewed and recommended nail removal.  I explained procedure risk and patient signed consent form understanding risk and today I infiltrated each hallux 60 mg like Marcaine mixture remove the hallux nails exposed matrix and applied phenol 5 applications 30 seconds followed by alcohol lavage and sterile dressing all this was done after sterile prep of the toes with sterile instrumentation.  I then instructed on soaks and reappoint and gave strict instructions to call if any issues should occur

## 2018-05-01 ENCOUNTER — Telehealth: Payer: Self-pay | Admitting: Podiatry

## 2018-05-01 MED ORDER — TRAMADOL HCL 50 MG PO TABS: 50.0000 mg | ORAL_TABLET | Freq: Three times a day (TID) | ORAL | 0 refills | Status: DC | PRN

## 2018-05-01 NOTE — Telephone Encounter (Signed)
Orders called to [phone].

## 2018-05-01 NOTE — Telephone Encounter (Signed)
I informed pt the doctor on call had ordered tramadol and if she could tolerate ibuprofen OTC she could take it as directed on the package in between the tramadol. Pt asked if we could give her anything stronger. I told pt routinely we did not give anything stronger narcotics, to describe what was going on. Pt states it had gotten better then all of a sudden the pain worsened. I transferred pt to scheduler to be scheduled for tomorrow to check for infection. I told pt to continue the epsom salt soaks and apply neosporin to a bandaid an put on the toe until seen in office.Transferred to scheduler. A. Horton - scheduler offered pt an appt 05/02/2018 and pt stated she wanted to wait to see Dr. Paulla Dolly. I told A. Horton to tell pt if the pain worsened or symptoms worsened go to the ED.

## 2018-05-01 NOTE — Addendum Note (Signed)
Addended by: Harriett Sine D on: 05/01/2018 01:53 PM   Modules accepted: Orders

## 2018-05-01 NOTE — Telephone Encounter (Signed)
Unable to leave message on home phone, mailbox is full.

## 2018-05-01 NOTE — Telephone Encounter (Signed)
Im having a lot of pain from where my toenails where removed. I need something for pain. I woke up last night in a lot of pain and I have not been able to go back to sleep. I use [name] at [employer].

## 2018-05-06 ENCOUNTER — Ambulatory Visit: Payer: Medicare Other | Admitting: Podiatry

## 2018-05-13 ENCOUNTER — Ambulatory Visit: Payer: Medicare Other | Admitting: Podiatry

## 2018-05-15 ENCOUNTER — Ambulatory Visit: Payer: Medicare Other | Admitting: Podiatry

## 2018-06-16 ENCOUNTER — Other Ambulatory Visit: Payer: Self-pay | Admitting: Family Medicine

## 2018-06-16 DIAGNOSIS — R413 Other amnesia: Secondary | ICD-10-CM

## 2018-06-23 DIAGNOSIS — R001 Bradycardia, unspecified: Secondary | ICD-10-CM

## 2018-06-23 HISTORY — DX: Bradycardia, unspecified: R00.1

## 2018-06-26 ENCOUNTER — Ambulatory Visit
Admission: RE | Admit: 2018-06-26 | Discharge: 2018-06-26 | Disposition: A | Discharge status: Home / Self Care | Payer: Medicare Other | Source: Ambulatory Visit | Attending: Family Medicine | Admitting: Family Medicine

## 2018-06-26 DIAGNOSIS — R413 Other amnesia: Secondary | ICD-10-CM

## 2018-06-26 MED ORDER — GADOBENATE DIMEGLUMINE 529 MG/ML IV SOLN
20.0000 mL | Freq: Once | INTRAVENOUS | Status: AC | PRN
  Administered 2018-06-26: 20 mL via INTRAVENOUS

## 2018-07-06 ENCOUNTER — Other Ambulatory Visit: Payer: Medicare Other | Admitting: Podiatry

## 2018-07-15 ENCOUNTER — Emergency Department (HOSPITAL_COMMUNITY): Payer: Medicare Other

## 2018-07-15 ENCOUNTER — Other Ambulatory Visit: Payer: Self-pay

## 2018-07-15 ENCOUNTER — Encounter (HOSPITAL_COMMUNITY): Payer: Self-pay

## 2018-07-15 ENCOUNTER — Inpatient Hospital Stay (HOSPITAL_COMMUNITY)
Admission: EM | Admit: 2018-07-15 | Discharge: 2018-07-18 | DRG: 244 | Disposition: A | Discharge status: Home / Self Care | Payer: Medicare Other | Source: Ambulatory Visit | Attending: Cardiovascular Disease | Admitting: Cardiovascular Disease

## 2018-07-15 DIAGNOSIS — Z7982 Long term (current) use of aspirin: Secondary | ICD-10-CM | POA: Diagnosis not present

## 2018-07-15 DIAGNOSIS — Z950 Presence of cardiac pacemaker: Secondary | ICD-10-CM

## 2018-07-15 DIAGNOSIS — I442 Atrioventricular block, complete: Principal | ICD-10-CM

## 2018-07-15 DIAGNOSIS — Z9221 Personal history of antineoplastic chemotherapy: Secondary | ICD-10-CM | POA: Diagnosis not present

## 2018-07-15 DIAGNOSIS — I10 Essential (primary) hypertension: Secondary | ICD-10-CM | POA: Diagnosis present

## 2018-07-15 DIAGNOSIS — F0390 Unspecified dementia without behavioral disturbance: Secondary | ICD-10-CM | POA: Diagnosis present

## 2018-07-15 DIAGNOSIS — Z90710 Acquired absence of both cervix and uterus: Secondary | ICD-10-CM | POA: Diagnosis not present

## 2018-07-15 DIAGNOSIS — Z8544 Personal history of malignant neoplasm of other female genital organs: Secondary | ICD-10-CM | POA: Diagnosis not present

## 2018-07-15 DIAGNOSIS — E119 Type 2 diabetes mellitus without complications: Secondary | ICD-10-CM | POA: Diagnosis present

## 2018-07-15 DIAGNOSIS — Z79899 Other long term (current) drug therapy: Secondary | ICD-10-CM | POA: Diagnosis not present

## 2018-07-15 DIAGNOSIS — Z853 Personal history of malignant neoplasm of breast: Secondary | ICD-10-CM | POA: Diagnosis not present

## 2018-07-15 DIAGNOSIS — R9431 Abnormal electrocardiogram [ECG] [EKG]: Secondary | ICD-10-CM | POA: Diagnosis not present

## 2018-07-15 DIAGNOSIS — Z923 Personal history of irradiation: Secondary | ICD-10-CM | POA: Diagnosis not present

## 2018-07-15 DIAGNOSIS — R001 Bradycardia, unspecified: Secondary | ICD-10-CM | POA: Diagnosis present

## 2018-07-15 HISTORY — DX: Bradycardia, unspecified: R00.1

## 2018-07-15 LAB — BASIC METABOLIC PANEL
Anion gap: 10 (ref 5–15)
BUN: 16 mg/dL (ref 8–23)
CO2: 27 mmol/L (ref 22–32)
Calcium: 10.2 mg/dL (ref 8.9–10.3)
Chloride: 106 mmol/L (ref 98–111)
Creatinine, Ser: 0.81 mg/dL (ref 0.44–1.00)
GFR calc Af Amer: >60 mL/min (ref >60)
GFR calc non Af Amer: >60 mL/min (ref >60)
Glucose, Bld: 92 mg/dL (ref 70–99)
Potassium: 3.6 mmol/L (ref 3.5–5.1)
Sodium: 143 mmol/L (ref 135–145)

## 2018-07-15 LAB — CBC
HCT: 43.5 % (ref 36.0–46.0)
Hemoglobin: 13.6 g/dL (ref 12.0–15.0)
MCH: 27.3 pg (ref 26.0–34.0)
MCHC: 31.3 g/dL (ref 30.0–36.0)
MCV: 87.3 fL (ref 80.0–100.0)
Platelets: 237 10*3/uL (ref 150–400)
RBC: 4.98 MIL/uL (ref 3.87–5.11)
RDW: 14.9 % (ref 11.5–15.5)
WBC: 11 10*3/uL — ABNORMAL HIGH (ref 4.0–10.5)
nRBC: 0 % (ref 0.0–0.2)

## 2018-07-15 LAB — PROTIME-INR
INR: 1.09
Prothrombin Time: 14 seconds (ref 11.4–15.2)

## 2018-07-15 MED ORDER — INSULIN ASPART 100 UNIT/ML ~~LOC~~ SOLN
0.0000 [IU] | Freq: Three times a day (TID) | SUBCUTANEOUS | Status: DC
  Administered 2018-07-17: 3 [IU] via SUBCUTANEOUS
  Administered 2018-07-17: 2 [IU] via SUBCUTANEOUS

## 2018-07-15 MED ORDER — ASPIRIN 81 MG PO CHEW
81.0000 mg | CHEWABLE_TABLET | ORAL | Status: DC
  Administered 2018-07-16: 81 mg via ORAL
  Filled 2018-07-15: qty 1

## 2018-07-15 MED ORDER — SODIUM CHLORIDE 0.9% FLUSH
3.0000 mL | Freq: Two times a day (BID) | INTRAVENOUS | Status: DC
  Administered 2018-07-15 – 2018-07-18 (×4): 3 mL via INTRAVENOUS

## 2018-07-15 MED ORDER — CEFAZOLIN SODIUM-DEXTROSE 2-4 GM/100ML-% IV SOLN: 2.0000 g | INTRAVENOUS | Status: DC

## 2018-07-15 MED ORDER — CEFAZOLIN SODIUM-DEXTROSE 2-4 GM/100ML-% IV SOLN
2.0000 g | INTRAVENOUS | Status: AC
  Administered 2018-07-16: 2 g via INTRAVENOUS
  Filled 2018-07-15: qty 100

## 2018-07-15 MED ORDER — SODIUM CHLORIDE 0.9% FLUSH: 3.0000 mL | INTRAVENOUS | Status: DC | PRN

## 2018-07-15 MED ORDER — SODIUM CHLORIDE 0.9% FLUSH
3.0000 mL | Freq: Two times a day (BID) | INTRAVENOUS | Status: DC
  Administered 2018-07-16: 3 mL via INTRAVENOUS

## 2018-07-15 MED ORDER — CHLORHEXIDINE GLUCONATE 4 % EX LIQD
60.0000 mL | Freq: Once | CUTANEOUS | Status: AC
  Administered 2018-07-16: 4 via TOPICAL
  Filled 2018-07-15 (×2): qty 60

## 2018-07-15 MED ORDER — SODIUM CHLORIDE 0.9 % IV SOLN
250.0000 mL | INTRAVENOUS | Status: DC | PRN
  Administered 2018-07-16: 250 mL via INTRAVENOUS

## 2018-07-15 MED ORDER — SODIUM CHLORIDE 0.9 % IV SOLN
80.0000 mg | INTRAVENOUS | Status: AC
  Administered 2018-07-16: 80 mg
  Filled 2018-07-15: qty 2

## 2018-07-15 MED ORDER — SODIUM CHLORIDE 0.9 % IV SOLN: 250.0000 mL | INTRAVENOUS | Status: DC

## 2018-07-15 MED ORDER — SODIUM CHLORIDE 0.9 % IV SOLN
80.0000 mg | INTRAVENOUS | Status: DC
  Filled 2018-07-15: qty 2

## 2018-07-15 MED ORDER — SODIUM CHLORIDE 0.9 % IV SOLN
INTRAVENOUS | Status: DC
  Administered 2018-07-15: 22:00:00 via INTRAVENOUS

## 2018-07-15 MED ORDER — CHLORHEXIDINE GLUCONATE 4 % EX LIQD
60.0000 mL | Freq: Once | CUTANEOUS | Status: DC
  Filled 2018-07-15: qty 60

## 2018-07-15 NOTE — ED Provider Notes (Signed)
Patient placed in Quick Look pathway, seen and evaluated   Chief Complaint: abnormal EKG  HPI: [name] is a 72 y.o. female who presents to the ED from her doctor with report of abnormal EKG. Patient here from Dr. Darcus Austin office with complete heart blocl with rate of 36. Patient c/o feeling tired. She also reports she is having memory problems.   ROS: Neuro: weakness  Physical Exam:  BP (!) 211/65 (BP Location: Right Arm)   Pulse (!) 35   Temp 98.1 F (36.7 C) (Oral)   Resp 18   Ht 5' (1.524 m)   Wt 65.8 kg   SpO2 100%   BMI 28.32 kg/m    Gen: No distress  Neuro: Awake and Alert  Skin: Warm and dry  Heart: bradycardic   Initiation of care has begun. The patient has been counseled on the process, plan, and necessity for staying for the completion/evaluation, and the remainder of the medical screening examination    Ashley Murrain, NP 07/15/18 [id number]    Mesner, Corene Cornea, MD 07/15/18 [phone]

## 2018-07-15 NOTE — ED Triage Notes (Signed)
Pt arrived from PCP with report of complete heart block with rate at 36. Pt. In NAD.

## 2018-07-15 NOTE — ED Notes (Signed)
Patient transported to CT 

## 2018-07-15 NOTE — ED Notes (Signed)
Pt went to a doctor's appt today and was told she needed to come to the emergency department for an abnormal EKG. Doctor's office reports stable new onset third degree heart block.

## 2018-07-15 NOTE — ED Provider Notes (Signed)
Emergency Department Provider Note   I have reviewed the triage vital signs and the nursing notes.   HISTORY  Chief Complaint Abnormal ECG   HPI [name] is a 72 y.o. female who was not able to supply history the presents the emerge department today for bradycardia.  She is asymptomatic from this standpoint.  She has severe dementia is not able to provide any history but the family states she has been passing out, short of breath or chest pain.  No new symptoms or new medications. No other associated or modifying symptoms.   Level V Caveat Secondary to Dementia  Past Medical History:  Diagnosis Date  . Breast cancer ([place])   . Cancer [facility]) left    breast cancer 2005 surgery, chemo and radiation   . Diabetes mellitus   . Headache   . Vulvar cancer ([place]) 2017 new dx    Patient Active Problem List   Diagnosis Date Noted  . Bradycardia 07/15/2018  . Vulval lesion 02/01/2016  . Vulvar cancer ([place]) 01/10/2016  . 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 DIABETES, TYPE 2 11/28/2008  . COUGH 11/28/2008  . NEOPLASM, MALIGNANT, BREAST, HX OF 11/28/2008    Past Surgical History:  Procedure Laterality Date  . ABDOMINAL HYSTERECTOMY     complete  . BREAST SURGERY Left 2006   left breast lumpectomy , chemo and radiation done  . COLONOSCOPY WITH PROPOFOL N/A 07/24/2015   Procedure: COLONOSCOPY WITH PROPOFOL;  Surgeon: Garlan Fair, MD;  Location: [facility];  Service: Endoscopy;  Laterality: N/A;  . EYE SURGERY     removal of growth 35 years ago  . HEMORROIDECTOMY     38 years ago, no present symtoms  . RADICAL VULVECTOMY N/A 02/01/2016   Procedure: RADICAL VULVECTOMY;  Surgeon: [name], MD;  Location: [facility]S;  Service: Gynecology;  Laterality: N/A;    Allergies Duloxetine  Family History  Problem Relation Age of  Onset  . Cancer Maternal Grandmother     Social History Social History   Tobacco Use  . Smoking status: Never Smoker  . Smokeless tobacco: Never Used  Substance Use Topics  . Alcohol use: No  . Drug use: No    Review of Systems  All other systems negative except as documented in the HPI. All pertinent positives and negatives as reviewed in the HPI. ____________________________________________   PHYSICAL EXAM:  VITAL SIGNS: ED Triage Vitals  Enc Vitals Group     BP 07/15/18 1741 (!) 211/65     Pulse Rate 07/15/18 1741 (!) 35     Resp 07/15/18 1741 18     Temp 07/15/18 1741 98.1 F (36.7 C)     Temp Source 07/15/18 1741 Oral     SpO2 07/15/18 1741 100 %     Weight 07/15/18 1736 145 lb (65.8 kg)     Height 07/15/18 1736 5' (1.524 m)    Constitutional: Alert and disoriented. Well appearing and in no acute distress. Eyes: Conjunctivae are normal. PERRL. EOMI. Head: Atraumatic. Nose: No congestion/rhinnorhea. Mouth/Throat: Mucous membranes are moist.  Oropharynx non-erythematous. Neck: No stridor.  No meningeal signs.   Cardiovascular: bradycardic rate, regular rhythm. Good peripheral circulation. Grossly normal heart sounds.   Respiratory: Normal respiratory effort.  No retractions. Lungs CTAB. Gastrointestinal: Soft and nontender. No distention.  Musculoskeletal: No lower extremity tenderness  nor edema. No gross deformities of extremities. Neurologic:  Normal speech and language. No gross focal neurologic deficits are appreciated.  Skin:  Skin is warm, dry and intact. No rash noted.  ____________________________________________   LABS (all labs ordered are listed, but only abnormal results are displayed)  Labs Reviewed  CBC - Abnormal; Notable for the following components:      Result Value   WBC 11.0 (*)    All other components within normal limits  SURGICAL PCR SCREEN  MRSA PCR SCREENING  BASIC METABOLIC PANEL  PROTIME-INR  BASIC METABOLIC PANEL    HEMOGLOBIN A1C  I-STAT TROPONIN, ED  I-STAT CHEM 8, ED   ____________________________________________  EKG   EKG Interpretation  Date/Time:  Wednesday July 15 2018 17:30:47 EDT Ventricular Rate:  37 PR Interval:    QRS Duration: 94 QT Interval:  522 QTC Calculation: 409 R Axis:   117 Text Interpretation:  appears to have a complete heart block with junctional rhythm. difficult to analyze further, will repeat Confirmed by Merrily Pew [phone]) on 07/15/2018 5:45:28 PM       EKG Interpretation  Date/Time:  Wednesday July 15 2018 17:55:39 EDT Ventricular Rate:  37 PR Interval:    QRS Duration: 129 QT Interval:  661 QTC Calculation: 519 R Axis:   94 Text Interpretation:  AV block, complete (third degree) RBBB and LPFB Abnormal T, consider ischemia, lateral leads Confirmed by Merrily Pew [phone]) on 07/15/2018 6:52:50 PM      ____________________________________________  RADIOLOGY  Ct Head Wo Contrast  Result Date: 07/15/2018 CLINICAL DATA:  Altered level of consciousness EXAM: CT HEAD WITHOUT CONTRAST TECHNIQUE: Contiguous axial images were obtained from the base of the skull through the vertex without intravenous contrast. COMPARISON:  06/26/2018 FINDINGS: Brain: No acute intracranial abnormality. Specifically, no hemorrhage, hydrocephalus, mass lesion, acute infarction, or significant intracranial injury. There is atrophy and chronic small vessel disease changes. Vascular: No hyperdense vessel or unexpected calcification. Skull: No acute calvarial abnormality. Sinuses/Orbits: Visualized paranasal sinuses and mastoids clear. Orbital soft tissues unremarkable. Other: None IMPRESSION: No acute intracranial abnormality. Atrophy, chronic microvascular disease. Electronically Signed   By: Rolm Baptise M.D.   On: 07/15/2018 19:36   ____________________________________________   PROCEDURES  Procedure(s) performed:   .Critical Care Performed by: Merrily Pew,  MD Authorized by: Merrily Pew, MD   Critical care provider statement:    Critical care time (minutes):  45   Critical care was necessary to treat or prevent imminent or life-threatening deterioration of the following conditions:  Cardiac failure   Critical care was time spent personally by me on the following activities:  Discussions with consultants, evaluation of patient's response to treatment, examination of patient, ordering and performing treatments and interventions, ordering and review of laboratory studies, ordering and review of radiographic studies, pulse oximetry, re-evaluation of patient's condition, obtaining history from patient or surrogate and review of old charts   ____________________________________________   INITIAL IMPRESSION / ASSESSMENT AND PLAN / ED COURSE  72 year old female here with asymptomatic bradycardia 2/2 complete heart block.  Discussed with cardiology who will come see the patient.  Cardiology will admit for pacemaker placement.   Pertinent labs & imaging results that were available during my care of the patient were reviewed by me and considered in my medical decision making (see chart for details).  ____________________________________________  FINAL CLINICAL IMPRESSION(S) / ED DIAGNOSES  Final diagnoses:  Complete heart block (HCC)  Bradycardia  Hypertension, unspecified type    MEDICATIONS GIVEN DURING THIS  VISIT:  Medications  aspirin chewable tablet 81 mg (has no administration in time range)  sodium chloride flush (NS) 0.9 % injection 3 mL (3 mLs Intravenous Given 07/15/18 2103)  sodium chloride flush (NS) 0.9 % injection 3 mL (has no administration in time range)  0.9 %  sodium chloride infusion (has no administration in time range)  0.9 %  sodium chloride infusion ( Intravenous Transfusing/Transfer 07/15/18 2240)  chlorhexidine (HIBICLENS) 4 % liquid 4 application (has no administration in time range)  chlorhexidine (HIBICLENS) 4  % liquid 4 application (has no administration in time range)  sodium chloride flush (NS) 0.9 % injection 3 mL (has no administration in time range)  sodium chloride flush (NS) 0.9 % injection 3 mL (has no administration in time range)  0.9 %  sodium chloride infusion (has no administration in time range)  insulin aspart (novoLOG) injection 0-9 Units (has no administration in time range)  gentamicin (GARAMYCIN) 80 mg in sodium chloride 0.9 % 500 mL irrigation (has no administration in time range)  ceFAZolin (ANCEF) IVPB 2g/100 mL premix (has no administration in time range)     NEW OUTPATIENT MEDICATIONS STARTED DURING THIS VISIT:  Current Discharge Medication List      Note:  This note was prepared with assistance of Dragon voice recognition software. Occasional wrong-word or sound-a-like substitutions may have occurred due to the inherent limitations of voice recognition software.   Merrily Pew, MD 07/15/18 [phone]

## 2018-07-15 NOTE — H&P (Signed)
Cardiology Admission History and Physical:   Patient ID: [name] MRN: [id number]; DOB: [birth year]   Admission date: 07/15/2018  Primary Care Provider: Darcus Austin, MD Primary Cardiologist: No primary care provider on file. new Primary Electrophysiologist:  None   Chief Complaint: My heart is beating very slow  Patient Profile:   [name] is a 72 y.o. female with history of diabetes mellitus but without history of structural heart disease or meaningful arrhythmia, incidentally found to have complete heart block at a routine office visit today.  History of Present Illness:   [name] was seeing her physician for a routine visit today when she was found to be severely bradycardic.  Her electrocardiogram disclosed third-degree atrioventricular block and she was referred to the emergency room.  She denies problems with syncope, dizziness, weakness, fatigue or dyspnea.  She is quite sedentary "because she is lazy".  She denies angina, focal neurological complaints, leg edema, claudication.  She has noticed that her memory has been deteriorating and this is confirmed by her family.  Her husband and son are both in the exam room tonight.  She has evidence of conduction system disease that dates back to at least 2017 when an ECG showed right bundle branch block and right axis deviation, almost meeting criteria for left posterior fascicular block.  Today's tracing actually shows an identical QRS morphology with right bundle branch block and right axis deviation, suggesting that she has a relatively high, probably junctional source for her escape rhythm.  Nevertheless, there is evidence of complete A-V dissociation and the QRS is broad with evidence of substantial infrahisian disease.  Her past medical history is significant for left breast cancer treated with both surgery as well as radiation therapy and chemotherapy in 2005.  As far as she knows her cholesterol is "okay".  She  does not have high blood pressure and she has never had a stroke.  She has "well-controlled" diabetes mellitus (the only hemoglobin A1c available for review is from May 2017 and was 8.5%).  She has a history of squamous cell vulvar cancer in 2017, treated surgically.   Past Medical History:  Diagnosis Date  . Breast cancer ([place])   . Cancer [facility]) left    breast cancer 2005 surgery, chemo and radiation   . Diabetes mellitus   . Headache   . Vulvar cancer ([place]) 2017 new dx    Past Surgical History:  Procedure Laterality Date  . ABDOMINAL HYSTERECTOMY     complete  . BREAST SURGERY Left 2006   left breast lumpectomy , chemo and radiation done  . COLONOSCOPY WITH PROPOFOL N/A 07/24/2015   Procedure: COLONOSCOPY WITH PROPOFOL;  Surgeon: Garlan Fair, MD;  Location: [facility];  Service: Endoscopy;  Laterality: N/A;  . EYE SURGERY     removal of growth 35 years ago  . HEMORROIDECTOMY     38 years ago, no present symtoms  . RADICAL VULVECTOMY N/A 02/01/2016   Procedure: RADICAL VULVECTOMY;  Surgeon: [name], MD;  Location: [facility];  Service: Gynecology;  Laterality: N/A;     Medications Prior to Admission: Prior to Admission medications   Medication Sig Start Date End Date Taking? Authorizing Provider  aspirin 81 MG tablet Take 81 mg by mouth 2 (two) times a week.    Yes [provider]  cyanocobalamin 2000 MCG tablet Take 2,000 mcg by mouth daily.   Yes [provider]  ergocalciferol (VITAMIN D2) 50000 UNITS capsule Take 50,000 Units by  mouth every Tuesday.    Yes [provider]  naproxen sodium (ALEVE) 220 MG tablet Take 220-440 mg by mouth 2 (two) times daily as needed (for pain).   Yes [provider]  glucose blood test strip 1 each by Other route as needed for other. Use as instructed    [provider]  oxyCODONE-acetaminophen (PERCOCET/ROXICET) 5-325 MG tablet Take 1-2 tablets by mouth every 4 (four) hours as needed (moderate  to severe pain). Patient not taking: Reported on 07/15/2018 02/02/16   Joylene John D, NP  traMADol (ULTRAM) 50 MG tablet Take 1 tablet (50 mg total) by mouth every 8 (eight) hours as needed. Patient not taking: Reported on 07/15/2018 05/01/18   Evelina Bucy, DPM     Allergies:    Allergies  Allergen Reactions  . Duloxetine Itching    Cymbalta    Social History:   Social History   Socioeconomic History  . Marital status: Married    Spouse name: Not on file  . Number of children: Not on file  . Years of education: Not on file  . Highest education level: Not on file  Occupational History  . Not on file  Social Needs  . Financial resource strain: Not on file  . Food insecurity:    Worry: Not on file    Inability: Not on file  . Transportation needs:    Medical: Not on file    Non-medical: Not on file  Tobacco Use  . Smoking status: Never Smoker  . Smokeless tobacco: Never Used  Substance and Sexual Activity  . Alcohol use: No  . Drug use: No  . Sexual activity: Yes  Lifestyle  . Physical activity:    Days per week: Not on file    Minutes per session: Not on file  . Stress: Not on file  Relationships  . Social connections:    Talks on phone: Not on file    Gets together: Not on file    Attends religious service: Not on file    Active member of club or organization: Not on file    Attends meetings of clubs or organizations: Not on file    Relationship status: Not on file  . Intimate partner violence:    Fear of current or ex partner: Not on file    Emotionally abused: Not on file    Physically abused: Not on file    Forced sexual activity: Not on file  Other Topics Concern  . Not on file  Social History Narrative  . Not on file    Family History:   The patient's family history includes Cancer in her maternal grandmother.    ROS:  Please see the history of present illness.  All other ROS reviewed and negative.     Physical Exam/Data:   Vitals:    07/15/18 1915 07/15/18 1930 07/15/18 2000 07/15/18 2015  BP: (!) 188/86 (!) 206/63 (!) 176/63 (!) 192/60  Pulse: (!) 34 (!) 33 (!) 36 (!) 35  Resp: 18 12 14 19   Temp:      TempSrc:      SpO2: 99% 97% 100% 92%  Weight:      Height:       No intake or output data in the 24 hours ending 07/15/18 2044 Filed Weights   07/15/18 1736  Weight: 65.8 kg   Body mass index is 28.32 kg/m.  General:  Well nourished, well developed, in no acute distress HEENT: normal Lymph: no  adenopathy Neck: 6 cm JVD with "cannon" A waves Endocrine:  No thryomegaly Vascular: No carotid bruits; FA pulses 2+ bilaterally without bruits  Cardiac:  normal S1, S2; RRR; no murmur;  bradycardia. Lungs:  clear to auscultation bilaterally, no wheezing, rhonchi or rales  Abd: soft, nontender, no hepatomegaly  Ext: no edema Musculoskeletal:  No deformities, BUE and BLE strength normal and equal Skin: warm and dry  Neuro:  CNs 2-12 intact, no focal abnormalities noted Psych:  Normal affect    EKG:  The ECG that was done  was personally reviewed and demonstrates normal sinus mechanism with complete heart block/A-V dissociation, QRS pattern of right bundle branch block right axis deviation is very similar to that seen on ECG in sinus rhythm in 2017  Relevant CV Studies: n/a  Laboratory Data:  Chemistry Recent Labs  Lab 07/15/18 1822  NA 143  K 3.6  CL 106  CO2 27  GLUCOSE 92  BUN 16  CREATININE 0.81  CALCIUM 10.2  GFRNONAA >60  GFRAA >60  ANIONGAP 10    No results for input(s): PROT, ALBUMIN, AST, ALT, ALKPHOS, BILITOT in the last 168 hours. Hematology Recent Labs  Lab 07/15/18 1822  WBC 11.0*  RBC 4.98  HGB 13.6  HCT 43.5  MCV 87.3  MCH 27.3  MCHC 31.3  RDW 14.9  PLT 237   Cardiac EnzymesNo results for input(s): TROPONINI in the last 168 hours. No results for input(s): TROPIPOC in the last 168 hours.  BNPNo results for input(s): BNP, PROBNP in the last 168 hours.  DDimer No results for  input(s): DDIMER in the last 168 hours.  Radiology/Studies:  Ct Head Wo Contrast  Result Date: 07/15/2018 CLINICAL DATA:  Altered level of consciousness EXAM: CT HEAD WITHOUT CONTRAST TECHNIQUE: Contiguous axial images were obtained from the base of the skull through the vertex without intravenous contrast. COMPARISON:  06/26/2018 FINDINGS: Brain: No acute intracranial abnormality. Specifically, no hemorrhage, hydrocephalus, mass lesion, acute infarction, or significant intracranial injury. There is atrophy and chronic small vessel disease changes. Vascular: No hyperdense vessel or unexpected calcification. Skull: No acute calvarial abnormality. Sinuses/Orbits: Visualized paranasal sinuses and mastoids clear. Orbital soft tissues unremarkable. Other: None IMPRESSION: No acute intracranial abnormality. Atrophy, chronic microvascular disease. Electronically Signed   By: Rolm Baptise M.D.   On: 07/15/2018 19:36    Assessment and Plan:   1. CHB: She is minimally symptomatic, at least at rest.  She is not taking any medications that could cause the heart block and does not have any major metabolic abnormalities.  Evidence of conduction system disease dates back for several years.  Although she is bradycardic, she seems to have a very reliable back-up rhythm (which might be a junctional escape rhythm, even though it has a wide QRS, since it is similar to the morphology of her AV conducted QRS on previous ECGs).  She does not need a temporary pacemaker.  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  However, she does not have any respiratory difficulty and her renal function is normal.  We will check an echocardiogram tomorrow to make sure she has normal LV function, before proceeding with implantation of a dual-chamber permanent pacemaker. This procedure has been fully reviewed with the patient and written informed consent has been obtained. 2. HTN: Her  elevated systolic blood pressure low diastolic blood pressure is likely related to the high stroke volume in the setting of bradycardia.  He does not require treatment  and of itself and is likely to normalize once she has a faster heart rate. 3. DM: Some uncertainty about the current treatment for her diabetes.  Will place on sliding scale insulin especially since she will be n.p.o. for the procedure tomorrow.  Severity of Illness: The appropriate patient status for this patient is INPATIENT. Inpatient status is judged to be reasonable and necessary in order to provide the required intensity of service to ensure the patient's safety. The patient's presenting symptoms, physical exam findings, and initial radiographic and laboratory data in the context of their chronic comorbidities is felt to place them at high risk for further clinical deterioration. Furthermore, it is not anticipated that the patient will be medically stable for discharge from the hospital within 2 midnights of admission. The following factors support the patient status of inpatient.   " The patient's presenting symptoms include severe bradycardia. " The worrisome physical exam findings include jugular venous distention, cannon A waves, severe bradycardia. " The initial radiographic and laboratory data are worrisome because of complete heart block on ECG. " The chronic co-morbidities include diabetes mellitus, early cognitive dysfunction.   * I certify that at the point of admission it is my clinical judgment that the patient will require inpatient hospital care spanning beyond 2 midnights from the point of admission due to high intensity of service, high risk for further deterioration and high frequency of surveillance required.*    For questions or updates, please contact [place] Please consult [email or web address] for contact info under        Signed, Sanda Klein, MD  07/15/2018 8:44 PM

## 2018-07-16 ENCOUNTER — Encounter (HOSPITAL_COMMUNITY)
Admission: EM | Disposition: A | Discharge status: Home / Self Care | Payer: Self-pay | Source: Ambulatory Visit | Attending: Cardiovascular Disease

## 2018-07-16 ENCOUNTER — Inpatient Hospital Stay (HOSPITAL_COMMUNITY): Payer: Medicare Other

## 2018-07-16 DIAGNOSIS — I442 Atrioventricular block, complete: Secondary | ICD-10-CM

## 2018-07-16 DIAGNOSIS — R9431 Abnormal electrocardiogram [ECG] [EKG]: Secondary | ICD-10-CM

## 2018-07-16 HISTORY — PX: PACEMAKER IMPLANT: EP1218

## 2018-07-16 LAB — BASIC METABOLIC PANEL
Anion gap: 6 (ref 5–15)
BUN: 16 mg/dL (ref 8–23)
CO2: 25 mmol/L (ref 22–32)
Calcium: 9.1 mg/dL (ref 8.9–10.3)
Chloride: 112 mmol/L — ABNORMAL HIGH (ref 98–111)
Creatinine, Ser: 0.87 mg/dL (ref 0.44–1.00)
GFR calc Af Amer: >60 mL/min (ref >60)
GFR calc non Af Amer: >60 mL/min (ref >60)
Glucose, Bld: 142 mg/dL — ABNORMAL HIGH (ref 70–99)
Potassium: 3.7 mmol/L (ref 3.5–5.1)
Sodium: 143 mmol/L (ref 135–145)

## 2018-07-16 LAB — ECHOCARDIOGRAM COMPLETE
Height: 59 in
Weight: 2430.4 oz

## 2018-07-16 LAB — HEMOGLOBIN A1C
Hgb A1c MFr Bld: 7 % — ABNORMAL HIGH (ref 4.8–5.6)
Mean Plasma Glucose: 154.2 mg/dL

## 2018-07-16 LAB — GLUCOSE, CAPILLARY
Glucose-Capillary: 137 mg/dL — ABNORMAL HIGH (ref 70–99)
Glucose-Capillary: 106 mg/dL — ABNORMAL HIGH (ref 70–99)
Glucose-Capillary: 95 mg/dL (ref 70–99)
Glucose-Capillary: 114 mg/dL — ABNORMAL HIGH (ref 70–99)
Glucose-Capillary: 122 mg/dL — ABNORMAL HIGH (ref 70–99)

## 2018-07-16 LAB — SURGICAL PCR SCREEN
MRSA, PCR: NEGATIVE
Staphylococcus aureus: POSITIVE — AB

## 2018-07-16 SURGERY — PACEMAKER IMPLANT

## 2018-07-16 MED ORDER — HYDRALAZINE HCL 20 MG/ML IJ SOLN
INTRAMUSCULAR | Status: AC
  Filled 2018-07-16: qty 1

## 2018-07-16 MED ORDER — ACETAMINOPHEN 325 MG PO TABS
325.0000 mg | ORAL_TABLET | ORAL | Status: DC | PRN
  Administered 2018-07-16 (×2): 650 mg via ORAL
  Filled 2018-07-16 (×2): qty 2

## 2018-07-16 MED ORDER — SODIUM CHLORIDE 0.9 % IV SOLN
INTRAVENOUS | Status: AC | PRN
  Administered 2018-07-16: 50 mL/h via INTRAVENOUS

## 2018-07-16 MED ORDER — SODIUM CHLORIDE 0.9 % IV SOLN
INTRAVENOUS | Status: AC
  Filled 2018-07-16: qty 2

## 2018-07-16 MED ORDER — MUPIROCIN 2 % EX OINT
1.0000 "application " | TOPICAL_OINTMENT | Freq: Two times a day (BID) | CUTANEOUS | Status: DC
  Administered 2018-07-16 – 2018-07-18 (×5): 1 via NASAL
  Filled 2018-07-16: qty 22

## 2018-07-16 MED ORDER — ONDANSETRON HCL 4 MG/2ML IJ SOLN: 4.0000 mg | Freq: Four times a day (QID) | INTRAMUSCULAR | Status: DC | PRN

## 2018-07-16 MED ORDER — CEFAZOLIN SODIUM-DEXTROSE 2-4 GM/100ML-% IV SOLN
INTRAVENOUS | Status: AC
  Filled 2018-07-16: qty 100

## 2018-07-16 MED ORDER — LIDOCAINE HCL (PF) 1 % IJ SOLN
INTRAMUSCULAR | Status: DC | PRN
  Administered 2018-07-16: 60 mL

## 2018-07-16 MED ORDER — LIDOCAINE HCL (PF) 1 % IJ SOLN
INTRAMUSCULAR | Status: AC
  Filled 2018-07-16: qty 60

## 2018-07-16 MED ORDER — METOPROLOL TARTRATE 5 MG/5ML IV SOLN
INTRAVENOUS | Status: AC
  Filled 2018-07-16: qty 5

## 2018-07-16 MED ORDER — HYDRALAZINE HCL 20 MG/ML IJ SOLN
INTRAMUSCULAR | Status: DC | PRN
  Administered 2018-07-16: 5 mg via INTRAVENOUS

## 2018-07-16 MED ORDER — HEPARIN (PORCINE) IN NACL 1000-0.9 UT/500ML-% IV SOLN
INTRAVENOUS | Status: DC | PRN
  Administered 2018-07-16: 500 mL

## 2018-07-16 MED ORDER — FENTANYL CITRATE (PF) 100 MCG/2ML IJ SOLN
INTRAMUSCULAR | Status: AC
  Filled 2018-07-16: qty 2

## 2018-07-16 MED ORDER — MIDAZOLAM HCL 5 MG/5ML IJ SOLN
INTRAMUSCULAR | Status: AC
  Filled 2018-07-16: qty 5

## 2018-07-16 MED ORDER — CHLORHEXIDINE GLUCONATE CLOTH 2 % EX PADS
6.0000 | MEDICATED_PAD | Freq: Every day | CUTANEOUS | Status: DC
  Administered 2018-07-16: 6 via TOPICAL

## 2018-07-16 MED ORDER — CEFAZOLIN SODIUM-DEXTROSE 1-4 GM/50ML-% IV SOLN
1.0000 g | Freq: Four times a day (QID) | INTRAVENOUS | Status: AC
  Administered 2018-07-16 – 2018-07-17 (×3): 1 g via INTRAVENOUS
  Filled 2018-07-16 (×3): qty 50

## 2018-07-16 MED ORDER — HEPARIN (PORCINE) IN NACL 1000-0.9 UT/500ML-% IV SOLN
INTRAVENOUS | Status: AC
  Filled 2018-07-16: qty 500

## 2018-07-16 MED ORDER — MENTHOL 3 MG MT LOZG
1.0000 | LOZENGE | OROMUCOSAL | Status: AC | PRN
  Filled 2018-07-16: qty 9

## 2018-07-16 MED ORDER — METOPROLOL TARTRATE 5 MG/5ML IV SOLN
INTRAVENOUS | Status: DC | PRN
  Administered 2018-07-16: 5 mg via INTRAVENOUS

## 2018-07-16 SURGICAL SUPPLY — 11 items
CABLE SURGICAL S-101-97-12 (CABLE) ×2 IMPLANT
CATH RIGHTSITE C315HIS02 (CATHETERS) ×1 IMPLANT
IPG PACE AZUR XT DR MRI W1DR01 (Pacemaker) IMPLANT
LEAD CAPSURE NOVUS 5076-52CM (Lead) ×1 IMPLANT
LEAD SELECT SECURE 3830 383069 (Lead) IMPLANT
PACE AZURE XT DR MRI W1DR01 (Pacemaker) ×2 IMPLANT
PAD PRO RADIOLUCENT 2001M-C (PAD) ×2 IMPLANT
SELECT SECURE 3830 383069 (Lead) ×2 IMPLANT
SHEATH CLASSIC 7F (SHEATH) ×2 IMPLANT
TRAY PACEMAKER INSERTION (PACKS) ×2 IMPLANT
WIRE HI TORQ VERSACORE-J 145CM (WIRE) ×1 IMPLANT

## 2018-07-16 NOTE — Consult Note (Addendum)
Cardiology Consultation:   Patient ID: [name] MRN: [id number]; DOB: 01-Oct-1945  Admit date: 07/15/2018 Date of Consult: 07/16/2018  Primary Care Provider: Darcus Austin, MD Primary Cardiologist: none Primary Electrophysiologist:  None    Patient Profile:   [name] is a 72 y.o. female with a hx of DM, breast and vulvar cancer treasted surgically  who is being seen today for the evaluation of CHB at the request of Dr. Sallyanne Kuster.  History of Present Illness:   [name] admits to having a poor memory, though reports  "I just knew I wasn't feeling right for a while"  She denies any CP, palpitations or cardiac awareness.  No dizzy spells, no reports of syncope or near syncope by the patient or chart hx that was aided by family.  She reports feeling very tired, no energy, generally weak in the last few weeks at least  LABS K+ 3.7 BUN/Creat 16/0.87 WBC 11.0 H/H 13/43 Plts 237  Past Medical History:  Diagnosis Date  . Breast cancer ([place])   . Cancer [facility]) left    breast cancer 2005 surgery, chemo and radiation   . Diabetes mellitus   . Headache   . Vulvar cancer ([place]) 2017 new dx    Past Surgical History:  Procedure Laterality Date  . ABDOMINAL HYSTERECTOMY     complete  . BREAST SURGERY Left 2006   left breast lumpectomy , chemo and radiation done  . COLONOSCOPY WITH PROPOFOL N/A 07/24/2015   Procedure: COLONOSCOPY WITH PROPOFOL;  Surgeon: Garlan Fair, MD;  Location: [facility];  Service: Endoscopy;  Laterality: N/A;  . EYE SURGERY     removal of growth 35 years ago  . HEMORROIDECTOMY     38 years ago, no present symtoms  . RADICAL VULVECTOMY N/A 02/01/2016   Procedure: RADICAL VULVECTOMY;  Surgeon: [name], MD;  Location: [facility];  Service: Gynecology;  Laterality: N/A;     Home Medications:  Prior to Admission medications   Medication Sig Start Date End Date Taking? Authorizing Provider  aspirin 81 MG tablet Take 81 mg by mouth 2 (two)  times a week.    Yes [provider]  cyanocobalamin 2000 MCG tablet Take 2,000 mcg by mouth daily.   Yes [provider]  ergocalciferol (VITAMIN D2) 50000 UNITS capsule Take 50,000 Units by mouth every Tuesday.    Yes [provider]  naproxen sodium (ALEVE) 220 MG tablet Take 220-440 mg by mouth 2 (two) times daily as needed (for pain).   Yes [provider]  glucose blood test strip 1 each by Other route as needed for other. Use as instructed    [provider]  oxyCODONE-acetaminophen (PERCOCET/ROXICET) 5-325 MG tablet Take 1-2 tablets by mouth every 4 (four) hours as needed (moderate to severe pain). Patient not taking: Reported on 07/15/2018 02/02/16   Joylene John D, NP  traMADol (ULTRAM) 50 MG tablet Take 1 tablet (50 mg total) by mouth every 8 (eight) hours as needed. Patient not taking: Reported on 07/15/2018 05/01/18   Evelina Bucy, DPM    Inpatient Medications: Scheduled Meds: . aspirin  81 mg Oral Once per day on Mon Thu  . chlorhexidine  60 mL Topical Once  . Chlorhexidine Gluconate Cloth  6 each Topical Daily  . gentamicin irrigation  80 mg Irrigation On Call  . insulin aspart  0-9 Units Subcutaneous TID WC  . mupirocin ointment  1 application Nasal BID  . sodium chloride flush  3 mL Intravenous Q12H  . sodium chloride flush  3 mL Intravenous Q12H   Continuous Infusions: . sodium chloride    . sodium chloride Stopped (07/15/18 2208)  . sodium chloride    .  ceFAZolin (ANCEF) IV     PRN Meds: sodium chloride, sodium chloride flush, sodium chloride flush  Allergies:    Allergies  Allergen Reactions  . Duloxetine Itching    Cymbalta    Social History:   Social History   Socioeconomic History  . Marital status: Married    Spouse name: Not on file  . Number of children: Not on file  . Years of education: Not on file  . Highest education level: Not on file  Occupational History  . Not on file  Social Needs  .  Financial resource strain: Not on file  . Food insecurity:    Worry: Not on file    Inability: Not on file  . Transportation needs:    Medical: Not on file    Non-medical: Not on file  Tobacco Use  . Smoking status: Never Smoker  . Smokeless tobacco: Never Used  Substance and Sexual Activity  . Alcohol use: No  . Drug use: No  . Sexual activity: Yes  Lifestyle  . Physical activity:    Days per week: Not on file    Minutes per session: Not on file  . Stress: Not on file  Relationships  . Social connections:    Talks on phone: Not on file    Gets together: Not on file    Attends religious service: Not on file    Active member of club or organization: Not on file    Attends meetings of clubs or organizations: Not on file    Relationship status: Not on file  . Intimate partner violence:    Fear of current or ex partner: Not on file    Emotionally abused: Not on file    Physically abused: Not on file    Forced sexual activity: Not on file  Other Topics Concern  . Not on file  Social History Narrative  . Not on file    Family History:   Family History  Problem Relation Age of Onset  . Cancer Maternal Grandmother      ROS:  Please see the history of present illness.  All other ROS reviewed and negative.     Physical Exam/Data:   Vitals:   07/15/18 2235 07/16/18 0015 07/16/18 0407 07/16/18 0748  BP: (!) 196/52 (!) 168/53  (!) 160/38  Pulse: (!) 42 (!) 40    Resp:      Temp: 98.1 F (36.7 C)  98.7 F (37.1 C)   TempSrc: Oral  Oral   SpO2: 100% 100%    Weight: 68.9 kg     Height: 4\' 11"  (1.499 m)       Intake/Output Summary (Last 24 hours) at 07/16/2018 0751 Last data filed at 07/16/2018 0300 Gross per 24 hour  Intake 250 ml  Output -  Net 250 ml   Filed Weights   07/15/18 1736 07/15/18 2235  Weight: 65.8 kg 68.9 kg   Body mass index is 30.68 kg/m.  General:  Well nourished, well developed, in no acute distress HEENT: normal Lymph: no  adenopathy Neck: no JVD Endocrine:  No thryomegaly Vascular: No carotid bruits Cardiac:   RRR, bradycardic; no murmurs, gallops or rubs Lungs:  CTA b/l, no wheezing, rhonchi or rales  Abd: soft, nontender Ext: no  edema Musculoskeletal:  No deformities, age appropriate atrophy Skin: warm and dry  Neuro:  no focal abnormalities noted, she is AAO x3 this , answers and asks questions appropriately Psych:  Normal affect   EKG:  The EKG was personally reviewed and demonstrates:  CHB RBBB, 37bpm Telemetry:  Telemetry was personally reviewed and demonstrates:   CHB, 40's  Relevant CV Studies:  Echo is ordered, pending  Laboratory Data:  Chemistry Recent Labs  Lab 07/15/18 1822 07/16/18 0358  NA 143 143  K 3.6 3.7  CL 106 112*  CO2 27 25  GLUCOSE 92 142*  BUN 16 16  CREATININE 0.81 0.87  CALCIUM 10.2 9.1  GFRNONAA >60 >60  GFRAA >60 >60  ANIONGAP 10 6    No results for input(s): PROT, ALBUMIN, AST, ALT, ALKPHOS, BILITOT in the last 168 hours. Hematology Recent Labs  Lab 07/15/18 1822  WBC 11.0*  RBC 4.98  HGB 13.6  HCT 43.5  MCV 87.3  MCH 27.3  MCHC 31.3  RDW 14.9  PLT 237   Cardiac EnzymesNo results for input(s): TROPONINI in the last 168 hours. No results for input(s): TROPIPOC in the last 168 hours.  BNPNo results for input(s): BNP, PROBNP in the last 168 hours.  DDimer No results for input(s): DDIMER in the last 168 hours.  Radiology/Studies:   Ct Head Wo Contrast Result Date: 07/15/2018 CLINICAL DATA:  Altered level of consciousness EXAM: CT HEAD WITHOUT CONTRAST TECHNIQUE: Contiguous axial images were obtained from the base of the skull through the vertex without intravenous contrast. COMPARISON:  06/26/2018 FINDINGS: Brain: No acute intracranial abnormality. Specifically, no hemorrhage, hydrocephalus, mass lesion, acute infarction, or significant intracranial injury. There is atrophy and chronic small vessel disease changes. Vascular: No hyperdense vessel  or unexpected calcification. Skull: No acute calvarial abnormality. Sinuses/Orbits: Visualized paranasal sinuses and mastoids clear. Orbital soft tissues unremarkable. Other: None IMPRESSION: No acute intracranial abnormality. Atrophy, chronic microvascular disease. Electronically Signed   By: Rolm Baptise M.D.   On: 07/15/2018 19:36    Assessment and Plan:   1. CHB 2. Symptomatic bradycardia     No reversible cause noted  Dr. Sallyanne Kuster in his H&P discussed recommendation for PPM implant, (note mentions son and husband were present) the procedure, potential risks/complications, and the patient agreeable. Dr. Lovena Le has seen and examined the patient this morning, re discussed recommendation for PPM, procedure, potential risks/benefits again, the patient recalls the discussion last night as well and is agreeable to proceed.  I have asked for her echo be done ASAP this AM   For questions or updates, please contact [place] Please consult [email or web address] for contact info under     Signed, Baldwin Jamaica, PA-C  07/16/2018 7:51 AM   EP Attending  Patient seen and examined. Agree with the findings as noted above by Tommye Standard, PA-C. The patient has a h/o feeling bad and fatigued and was found to be in CHB. She presents now for PPM insertion. I have discussed the treatment options with the patient and she wishes to proceed. Her exam is essentially unremarkable except for a regular bradycardia.  [name]

## 2018-07-16 NOTE — H&P (View-Only) (Signed)
Cardiology Consultation:   Patient ID: [name] MRN: [id number]; DOB: 10/16/1945  Admit date: 07/15/2018 Date of Consult: 07/16/2018  Primary Care Provider: Darcus Austin, MD Primary Cardiologist: none Primary Electrophysiologist:  None    Patient Profile:   [name] is a 72 y.o. female with a hx of DM, breast and vulvar cancer treasted surgically  who is being seen today for the evaluation of CHB at the request of Dr. Sallyanne Kuster.  History of Present Illness:   [name] admits to having a poor memory, though reports  "I just knew I wasn't feeling right for a while"  She denies any CP, palpitations or cardiac awareness.  No dizzy spells, no reports of syncope or near syncope by the patient or chart hx that was aided by family.  She reports feeling very tired, no energy, generally weak in the last few weeks at least  LABS K+ 3.7 BUN/Creat 16/0.87 WBC 11.0 H/H 13/43 Plts 237  Past Medical History:  Diagnosis Date  . Breast cancer ([place])   . Cancer [facility]) left    breast cancer 2005 surgery, chemo and radiation   . Diabetes mellitus   . Headache   . Vulvar cancer ([place]) 2017 new dx    Past Surgical History:  Procedure Laterality Date  . ABDOMINAL HYSTERECTOMY     complete  . BREAST SURGERY Left 2006   left breast lumpectomy , chemo and radiation done  . COLONOSCOPY WITH PROPOFOL N/A 07/24/2015   Procedure: COLONOSCOPY WITH PROPOFOL;  Surgeon: Garlan Fair, MD;  [facility];  Service: Endoscopy;  Laterality: N/A;  . EYE SURGERY     removal of growth 35 years ago  . HEMORROIDECTOMY     38 years ago, no present symtoms  . RADICAL VULVECTOMY N/A 02/01/2016   Procedure: RADICAL VULVECTOMY;  Surgeon: [name], MD;  [facility];  Service: Gynecology;  Laterality: N/A;     Home Medications:  Prior to Admission medications   Medication Sig Start Date End Date Taking? Authorizing Provider  aspirin 81 MG tablet Take 81 mg by mouth 2 (two)  times a week.    Yes [provider]  cyanocobalamin 2000 MCG tablet Take 2,000 mcg by mouth daily.   Yes [provider]  ergocalciferol (VITAMIN D2) 50000 UNITS capsule Take 50,000 Units by mouth every Tuesday.    Yes [provider]  naproxen sodium (ALEVE) 220 MG tablet Take 220-440 mg by mouth 2 (two) times daily as needed (for pain).   Yes [provider]  glucose blood test strip 1 each by Other route as needed for other. Use as instructed    [provider]  oxyCODONE-acetaminophen (PERCOCET/ROXICET) 5-325 MG tablet Take 1-2 tablets by mouth every 4 (four) hours as needed (moderate to severe pain). Patient not taking: Reported on 07/15/2018 02/02/16   Joylene John D, NP  traMADol (ULTRAM) 50 MG tablet Take 1 tablet (50 mg total) by mouth every 8 (eight) hours as needed. Patient not taking: Reported on 07/15/2018 05/01/18   Evelina Bucy, DPM    Inpatient Medications: Scheduled Meds: . aspirin  81 mg Oral Once per day on Mon Thu  . chlorhexidine  60 mL Topical Once  . Chlorhexidine Gluconate Cloth  6 each Topical Daily  . gentamicin irrigation  80 mg Irrigation On Call  . insulin aspart  0-9 Units Subcutaneous TID WC  . mupirocin ointment  1 application Nasal BID  . sodium chloride flush  3 mL Intravenous Q12H  . sodium chloride flush  3 mL Intravenous Q12H   Continuous Infusions: . sodium chloride    . sodium chloride Stopped (07/15/18 2208)  . sodium chloride    .  ceFAZolin (ANCEF) IV     PRN Meds: sodium chloride, sodium chloride flush, sodium chloride flush  Allergies:    Allergies  Allergen Reactions  . Duloxetine Itching    Cymbalta    Social History:   Social History   Socioeconomic History  . Marital status: Married    Spouse name: Not on file  . Number of children: Not on file  . Years of education: Not on file  . Highest education level: Not on file  Occupational History  . Not on file  Social Needs  .  Financial resource strain: Not on file  . Food insecurity:    Worry: Not on file    Inability: Not on file  . Transportation needs:    Medical: Not on file    Non-medical: Not on file  Tobacco Use  . Smoking status: Never Smoker  . Smokeless tobacco: Never Used  Substance and Sexual Activity  . Alcohol use: No  . Drug use: No  . Sexual activity: Yes  Lifestyle  . Physical activity:    Days per week: Not on file    Minutes per session: Not on file  . Stress: Not on file  Relationships  . Social connections:    Talks on phone: Not on file    Gets together: Not on file    Attends religious service: Not on file    Active member of club or organization: Not on file    Attends meetings of clubs or organizations: Not on file    Relationship status: Not on file  . Intimate partner violence:    Fear of current or ex partner: Not on file    Emotionally abused: Not on file    Physically abused: Not on file    Forced sexual activity: Not on file  Other Topics Concern  . Not on file  Social History Narrative  . Not on file    Family History:   Family History  Problem Relation Age of Onset  . Cancer Maternal Grandmother      ROS:  Please see the history of present illness.  All other ROS reviewed and negative.     Physical Exam/Data:   Vitals:   07/15/18 2235 07/16/18 0015 07/16/18 0407 07/16/18 0748  BP: (!) 196/52 (!) 168/53  (!) 160/38  Pulse: (!) 42 (!) 40    Resp:      Temp: 98.1 F (36.7 C)  98.7 F (37.1 C)   TempSrc: Oral  Oral   SpO2: 100% 100%    Weight: 68.9 kg     Height: 4\' 11"  (1.499 m)       Intake/Output Summary (Last 24 hours) at 07/16/2018 0751 Last data filed at 07/16/2018 0300 Gross per 24 hour  Intake 250 ml  Output -  Net 250 ml   Filed Weights   07/15/18 1736 07/15/18 2235  Weight: 65.8 kg 68.9 kg   Body mass index is 30.68 kg/m.  General:  Well nourished, well developed, in no acute distress HEENT: normal Lymph: no  adenopathy Neck: no JVD Endocrine:  No thryomegaly Vascular: No carotid bruits Cardiac:   RRR, bradycardic; no murmurs, gallops or rubs Lungs:  CTA b/l, no wheezing, rhonchi or rales  Abd: soft, nontender Ext: no  edema Musculoskeletal:  No deformities, age appropriate atrophy Skin: warm and dry  Neuro:  no focal abnormalities noted, she is AAO x3 this , answers and asks questions appropriately Psych:  Normal affect   EKG:  The EKG was personally reviewed and demonstrates:  CHB RBBB, 37bpm Telemetry:  Telemetry was personally reviewed and demonstrates:   CHB, 40's  Relevant CV Studies:  Echo is ordered, pending  Laboratory Data:  Chemistry Recent Labs  Lab 07/15/18 1822 07/16/18 0358  NA 143 143  K 3.6 3.7  CL 106 112*  CO2 27 25  GLUCOSE 92 142*  BUN 16 16  CREATININE 0.81 0.87  CALCIUM 10.2 9.1  GFRNONAA >60 >60  GFRAA >60 >60  ANIONGAP 10 6    No results for input(s): PROT, ALBUMIN, AST, ALT, ALKPHOS, BILITOT in the last 168 hours. Hematology Recent Labs  Lab 07/15/18 1822  WBC 11.0*  RBC 4.98  HGB 13.6  HCT 43.5  MCV 87.3  MCH 27.3  MCHC 31.3  RDW 14.9  PLT 237   Cardiac EnzymesNo results for input(s): TROPONINI in the last 168 hours. No results for input(s): TROPIPOC in the last 168 hours.  BNPNo results for input(s): BNP, PROBNP in the last 168 hours.  DDimer No results for input(s): DDIMER in the last 168 hours.  Radiology/Studies:   Ct Head Wo Contrast Result Date: 07/15/2018 CLINICAL DATA:  Altered level of consciousness EXAM: CT HEAD WITHOUT CONTRAST TECHNIQUE: Contiguous axial images were obtained from the base of the skull through the vertex without intravenous contrast. COMPARISON:  06/26/2018 FINDINGS: Brain: No acute intracranial abnormality. Specifically, no hemorrhage, hydrocephalus, mass lesion, acute infarction, or significant intracranial injury. There is atrophy and chronic small vessel disease changes. Vascular: No hyperdense vessel  or unexpected calcification. Skull: No acute calvarial abnormality. Sinuses/Orbits: Visualized paranasal sinuses and mastoids clear. Orbital soft tissues unremarkable. Other: None IMPRESSION: No acute intracranial abnormality. Atrophy, chronic microvascular disease. Electronically Signed   By: Rolm Baptise M.D.   On: 07/15/2018 19:36    Assessment and Plan:   1. CHB 2. Symptomatic bradycardia     No reversible cause noted  Dr. Sallyanne Kuster in his H&P discussed recommendation for PPM implant, (note mentions son and husband were present) the procedure, potential risks/complications, and the patient agreeable. Dr. Lovena Le has seen and examined the patient this morning, re discussed recommendation for PPM, procedure, potential risks/benefits again, the patient recalls the discussion last night as well and is agreeable to proceed.  I have asked for her echo be done ASAP this AM   For questions or updates, please contact [place] Please consult [email or web address] for contact info under     Signed, Baldwin Jamaica, PA-C  07/16/2018 7:51 AM   EP Attending  Patient seen and examined. Agree with the findings as noted above by Tommye Standard, PA-C. The patient has a h/o feeling bad and fatigued and was found to be in CHB. She presents now for PPM insertion. I have discussed the treatment options with the patient and she wishes to proceed. Her exam is essentially unremarkable except for a regular bradycardia.  [name]

## 2018-07-16 NOTE — Interval H&P Note (Signed)
History and Physical Interval Note:  07/16/2018 12:15 PM  [name]  has presented today for surgery, with the diagnosis of hb  The various methods of treatment have been discussed with the patient and family. After consideration of risks, benefits and other options for treatment, the patient has consented to  Procedure(s): PACEMAKER IMPLANT (N/A) as a surgical intervention .  The patient's history has been reviewed, patient examined, no change in status, stable for surgery.  I have reviewed the patient's chart and labs.  Questions were answered to the patient's satisfaction.     [name]

## 2018-07-16 NOTE — Progress Notes (Signed)
  Echocardiogram 2D Echocardiogram has been performed.  [name] 07/16/2018, 9:51 AM

## 2018-07-17 ENCOUNTER — Inpatient Hospital Stay (HOSPITAL_COMMUNITY): Payer: Medicare Other

## 2018-07-17 ENCOUNTER — Encounter (HOSPITAL_COMMUNITY): Payer: Self-pay | Admitting: Internal Medicine

## 2018-07-17 LAB — BASIC METABOLIC PANEL
Anion gap: 9 (ref 5–15)
BUN: 15 mg/dL (ref 8–23)
CO2: 25 mmol/L (ref 22–32)
Calcium: 9.4 mg/dL (ref 8.9–10.3)
Chloride: 107 mmol/L (ref 98–111)
Creatinine, Ser: 0.68 mg/dL (ref 0.44–1.00)
GFR calc Af Amer: >60 mL/min (ref >60)
GFR calc non Af Amer: >60 mL/min (ref >60)
Glucose, Bld: 103 mg/dL — ABNORMAL HIGH (ref 70–99)
Potassium: 3.8 mmol/L (ref 3.5–5.1)
Sodium: 141 mmol/L (ref 135–145)

## 2018-07-17 LAB — GLUCOSE, CAPILLARY
Glucose-Capillary: 88 mg/dL (ref 70–99)
Glucose-Capillary: 227 mg/dL — ABNORMAL HIGH (ref 70–99)
Glucose-Capillary: 166 mg/dL — ABNORMAL HIGH (ref 70–99)
Glucose-Capillary: 87 mg/dL (ref 70–99)

## 2018-07-17 MED ORDER — METOPROLOL TARTRATE 5 MG/5ML IV SOLN: 5.0000 mg | Freq: Once | INTRAVENOUS | Status: DC

## 2018-07-17 MED ORDER — METOPROLOL TARTRATE 25 MG PO TABS
25.0000 mg | ORAL_TABLET | Freq: Two times a day (BID) | ORAL | Status: DC
  Administered 2018-07-17 – 2018-07-18 (×3): 25 mg via ORAL
  Filled 2018-07-17 (×3): qty 1

## 2018-07-17 NOTE — Consult Note (Signed)
[facility] CM Primary Care Navigator  07/17/2018  [name] 07-11-1946 [id number]   Went to see patientat the bedsideto identify possible discharge needs. Met with her sister, son [name]) and daughter([name]) as well.  Son reports that patient was directed from her primary care provider's office to the ED due to "low heart rate" whichhad ledto this admission. 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 Darcus Austin with [place] [facility] as her primary care provider.   Patient's son [name] on [facility] to obtainmedications without difficulty.  Patient has been managing her medications at [facility] son's assistance using "pill box" system filled once a week.  Patient's son hasbeen providingtransportationto herdoctors' appointments.  Patientlives with husband [name]). Patient's husband, son and daughter are her main caregivers at home.  Anticipated discharge plan is home per son.   Patient and sonvoiced understanding to call primary care provider's office whenshegets home for a post discharge follow-up appointment within1- 2 weeksor sooner if needs arise.Patient letter (with PCP's contact number) was provided asareminder. According to son, patient has a follow-up appointment scheduled to see her primary care provider next week (07/22/18).   Explained topatient and familyregarding[facility] CM services available for health management andresourcesat home butdenied the need for services at thispoint.  Son verbalized that patient had refused anything to manage her health condition like DM which her primary care provider is aware of. According to daughter, patient's DM had improved after losing weight.  Explained to patient and family about importance and benefits of further managing patient's health issues mainly DM but patient  voiced out her refusal to do anything for it.  Patient hadadamantly declined [facility]-CM services offered which includes EMMIcalls to follow-up with her recovery at home. Patientand family was encouragedto seekreferral to [facility] care management from primary care providerifever patient changes her mind and as deemed necessary and appropriate forany services in thenearfuture.   [facility] care management information provided for future needs thatpatientmay have.  Primary care provider's office is listed as providing transition of care (TOC) follow-up.    For additional questions please contact:  Edwena Felty A. Odell Fasching, BSN, RN-BC [facility] PRIMARY CARE Navigator Cell: [phone]

## 2018-07-17 NOTE — Care Management Important Message (Signed)
Important Message  Patient Details  Name: JAZZMEN RESTIVO MRN: [id number] Date of Birth: 13-Jul-1946   Medicare Important Message Given:  Yes    James Lafalce 07/17/2018, 11:05 AM

## 2018-07-17 NOTE — Discharge Instructions (Signed)
° ° °  Supplemental Discharge Instructions for  Pacemaker Patients  Activity No heavy lifting or vigorous activity with your left/right arm for 6 to 8 weeks.  Do not raise your left/right arm above your head for one week.  Gradually raise your affected arm as drawn below.             07/20/18                   07/21/18                  07/22/18                07/23/18 __  NO DRIVING patient does not drive WOUND CARE - Keep the wound area clean and dry.  Do not get this area wet, no showers until after your wound check visit. - The tape/steri-strips on your wound will fall off; do not pull them off.  No bandage is needed on the site.  DO  NOT apply any creams, oils, or ointments to the wound area. - If you notice any drainage or discharge from the wound, any swelling or bruising at the site, or you develop a fever > 101? F after you are discharged home, call the office at once.  Special Instructions - You are still able to use cellular telephones; use the ear opposite the side where you have your pacemaker/defibrillator.  Avoid carrying your cellular phone near your device. - When traveling through airports, show security personnel your identification card to avoid being screened in the metal detectors.  Ask the security personnel to use the hand wand. - Avoid arc welding equipment, MRI testing (magnetic resonance imaging), TENS units (transcutaneous nerve stimulators).  Call the office for questions about other devices. - Avoid electrical appliances that are in poor condition or are not properly grounded. - Microwave ovens are safe to be near or to operate.

## 2018-07-17 NOTE — Progress Notes (Signed)
Ambulated pt to nurses station, no c/o fatigue or SOB,  VSS, pt has ambulated to restroom with minimal assistance throughout shift with no difficulties.

## 2018-07-17 NOTE — Progress Notes (Addendum)
Spoke with Dr. Lovena Le who has been tied up in OR due to late complex case. Given late nature of the day he advises against DC and team will re-evaluate and finalize plans in AM. Updated nurse of plan to relay to pt.  Bobbyjo Marulanda PA-C

## 2018-07-17 NOTE — Progress Notes (Addendum)
Progress Note  Patient Name: [name] Date of Encounter: 07/17/2018  Primary Cardiologist: new, Dr. Lovena Le  Subjective   No complaints of SOB, palpitations or CP  Patient's children at bedside, concerned about unusual irritability of the patient (pulled off BP cuff, and IV out.  Seems a bit tremulous to them with a "catch" in her breathing when she is talking  The patient does not feel SOB by her account  Inpatient Medications    Scheduled Meds: . aspirin  81 mg Oral Once per day on Mon Thu  . Chlorhexidine Gluconate Cloth  6 each Topical Daily  . insulin aspart  0-9 Units Subcutaneous TID WC  . metoprolol tartrate  25 mg Oral BID  . mupirocin ointment  1 application Nasal BID  . sodium chloride flush  3 mL Intravenous Q12H   Continuous Infusions: . sodium chloride 250 mL (07/16/18 2121)   PRN Meds: sodium chloride, acetaminophen, menthol-cetylpyridinium, ondansetron (ZOFRAN) IV, sodium chloride flush   Vital Signs    Vitals:   07/16/18 2007 07/16/18 2359 07/17/18 0449 07/17/18 0818  BP: (!) 146/71 (!) 157/74 139/72 (!) 160/92  Pulse: 75 69 (!) 59 73  Resp:    16  Temp: 98.6 F (37 C) 98.1 F (36.7 C) 98.1 F (36.7 C) 98.6 F (37 C)  TempSrc: Oral Oral Oral Oral  SpO2: 100% 99% 100% 100%  Weight:   69.9 kg   Height:        Intake/Output Summary (Last 24 hours) at 07/17/2018 1304 Last data filed at 07/17/2018 0314 Gross per 24 hour  Intake 440 ml  Output -  Net 440 ml   Filed Weights   07/15/18 1736 07/15/18 2235 07/17/18 0449  Weight: 65.8 kg 68.9 kg 69.9 kg    Telemetry    SR/ST/V paced - Personally Reviewed  ECG    SR/Vpaced - Personally Reviewed  Physical Exam   GEN: No acute distress.   Neck: No JVD Cardiac: RRR, no murmurs, rubs, or gallops.  Respiratory: CTA b/l. GI: Soft, nontender, non-distended  MS: No edema; No deformity. Neuro:  Nonfocal, she is appropriate, follows all commands without any obvious deficits. Psych:  Normal affect   Labs    Chemistry Recent Labs  Lab 07/15/18 1822 07/16/18 0358 07/17/18 0648  NA 143 143 141  K 3.6 3.7 3.8  CL 106 112* 107  CO2 27 25 25   GLUCOSE 92 142* 103*  BUN 16 16 15   CREATININE 0.81 0.87 0.68  CALCIUM 10.2 9.1 9.4  GFRNONAA >60 >60 >60  GFRAA >60 >60 >60  ANIONGAP 10 6 9      Hematology Recent Labs  Lab 07/15/18 1822  WBC 11.0*  RBC 4.98  HGB 13.6  HCT 43.5  MCV 87.3  MCH 27.3  MCHC 31.3  RDW 14.9  PLT 237    Cardiac EnzymesNo results for input(s): TROPONINI in the last 168 hours. No results for input(s): TROPIPOC in the last 168 hours.   BNPNo results for input(s): BNP, PROBNP in the last 168 hours.   DDimer No results for input(s): DDIMER in the last 168 hours.   Radiology    Dg Chest 2 View Result Date: 07/17/2018 CLINICAL DATA:  72 year old female with a history of pacemaker placement EXAM: CHEST - 2 VIEW COMPARISON:  PET-CT 01/22/2016, 11/26/2010, plain film 07/17/2010 FINDINGS: Cardiomediastinal silhouette unchanged in size and contour. Calcifications of the aortic arch. Interval placement of left chest wall pacing device with 2 leads in place  via subclavian approach. Surgical clips on the left chest. No pneumothorax. No confluent airspace disease or large pleural fluid. No displaced fracture IMPRESSION: Interval placement of left cardiac pacing device with 2 leads and no complicating features. Chronic lung changes. Electronically Signed   By: Corrie Mckusick D.O.   On: 07/17/2018 10:59    Ct Head Wo Contrast Result Date: 07/15/2018 CLINICAL DATA:  Altered level of consciousness EXAM: CT HEAD WITHOUT CONTRAST TECHNIQUE: Contiguous axial images were obtained from the base of the skull through the vertex without intravenous contrast. COMPARISON:  06/26/2018 FINDINGS: Brain: No acute intracranial abnormality. Specifically, no hemorrhage, hydrocephalus, mass lesion, acute infarction, or significant intracranial injury. There is atrophy and  chronic small vessel disease changes. Vascular: No hyperdense vessel or unexpected calcification. Skull: No acute calvarial abnormality. Sinuses/Orbits: Visualized paranasal sinuses and mastoids clear. Orbital soft tissues unremarkable. Other: None IMPRESSION: No acute intracranial abnormality. Atrophy, chronic microvascular disease. Electronically Signed   By: Rolm Baptise M.D.   On: 07/15/2018 19:36    Cardiac Studies   07/16/18 TTE Study Conclusions - Left ventricle: The cavity size was normal. Systolic function was   normal. The estimated ejection fraction was in the range of 50%   to 55%. Wall motion was normal; there were no regional wall   motion abnormalities. Cannot evaluate of LV diastolic function   due to complete heart block. - Aortic valve: Trileaflet; mildly thickened, mildly calcified   leaflets. - Mitral valve: There was mild regurgitation. - Right ventricle: The cavity size was mildly dilated. Wall   thickness was normal. - Atrial septum: No defect or patent foramen ovale was identified. - Tricuspid valve: Dilated annulus. Normal thickness leaflets.   There was mild regurgitation. Leaflet are poorly visualized, but   based on color doppler flow and continuous regurgitation, concern   that there may be malcoaptiation of the TV leaflets. Impressions: - Complete heart block with bradycardia in the low 30s. Grossly   normal EF without obvious wall motion abnormalities. Tricuspid   valve not clearly visualized, but regurgitant flow suggests   possibility of malcoaptation. Elevated CVP but unable to   accurately estimate PASP.  Patient Profile     72 y.o. female with a hx of DM, breast and vulvar cancer treasted surgically, admitted with weakness, fatigue and CHB  S/p PPM yesterday  Assessment & Plan    1. CHB 2. Symptomatic bradycardia     No reversible cause noted     S/p PPM yesterday     Device check today with intact function     CXR without ptx  Wound  care and activity instructions were discussed with the patient and her son at bedside With some familial concerns, Dr. Lovena Le revisited patient this afternoon. No clear or ongoing issues were appreciated Likely some minimal confusion/anxiety with some baseline memory issues, and sedation meds yesterday Will have her eat lunch, ambulate with staff in the hall and re-assess for possible discharge later this afternoon.  3. HTN     Lopressor 25mg  BID ordered  For questions or updates, please contact [place] Please consult [email or web address] for contact info under  Signed, Baldwin Jamaica, PA-C  07/17/2018, 1:04 PM    EP Attending  Patient seen and examined. See my note as well. Will hold off on DC as her family is concerned about her breathing and personality differences. I suspect all due to the versed and fentanyl.  [name]

## 2018-07-18 DIAGNOSIS — I10 Essential (primary) hypertension: Secondary | ICD-10-CM

## 2018-07-18 LAB — BASIC METABOLIC PANEL
Anion gap: 9 (ref 5–15)
BUN: 10 mg/dL (ref 8–23)
CO2: 23 mmol/L (ref 22–32)
Calcium: 9.4 mg/dL (ref 8.9–10.3)
Chloride: 106 mmol/L (ref 98–111)
Creatinine, Ser: 0.73 mg/dL (ref 0.44–1.00)
GFR calc Af Amer: >60 mL/min (ref >60)
GFR calc non Af Amer: >60 mL/min (ref >60)
Glucose, Bld: 100 mg/dL — ABNORMAL HIGH (ref 70–99)
Potassium: 3.8 mmol/L (ref 3.5–5.1)
Sodium: 138 mmol/L (ref 135–145)

## 2018-07-18 LAB — GLUCOSE, CAPILLARY
Glucose-Capillary: 97 mg/dL (ref 70–99)
Glucose-Capillary: 109 mg/dL — ABNORMAL HIGH (ref 70–99)

## 2018-07-18 MED ORDER — METOPROLOL TARTRATE 25 MG PO TABS: 25.0000 mg | ORAL_TABLET | Freq: Two times a day (BID) | ORAL | 0 refills | Status: DC

## 2018-07-18 NOTE — Discharge Summary (Addendum)
Discharge Summary    Patient ID: [name]  MRN: [id number], DOB/AGE: Mar 22, 1946 72 y.o.  Admit Date: 07/15/2018 Discharge Date: 07/18/2018  Primary Care Provider: Darcus Austin, MD Primary Cardiologist: Dr. Lovena Le, MD  Discharge Diagnoses    Active Problems:   Bradycardia   Allergies Allergies  Allergen Reactions  . 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   She was seeing her physician for a routine visit on 10/23 when she was found to be severely bradycardic.  Her EKG showed third-degree atrioventricular block and she was referred to the emergency room. She denied problems with syncope, dizziness, weakness, fatigue or dyspnea. She was noted to be quite sedentary "because she is lazy". She denied angina, focal neurological complaints, leg edema, claudication. She had noted that her memory had been deteriorating and this was confirmed by her family. 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  Hospital Course     Consultants: none   EKG showed an identical QRS morphology with right bundle branch block and right axis deviation, suggesting that she has a relatively high, probably junctional source for her escape rhythm. Nevertheless, there is evidence of complete A-V dissociation and the QRS is broad with evidence of substantial infrahisian disease. She was not taking any medications that could cause CHB and did not have any major metabolic  abnormalities. She did not require a temporary pacmaker given her BP was stable and she seemed to have a reliable back-up rhythm (which might be a junctional escape rhythm, even though it has a wide QRS, since it is similar to the morphology of her AV conducted QRS on previous ECGs). Echo showed EF 50-55%, normal wall motion, mild MR, mildly dilated RV.   She underwent successful implantation of a Medtronic dual-chamber PPM for symptomatic bradycardia due to CHB on 07/16/2018. See below for details. Post procedure, shew as noted to have some confusion. Device was functioning normally. CXR was without PTX. On the day of discharge, she was doing well and without complaints. Her dyspnea was resolved. Outpatient neurology evaluation was recommended given her confusion.    The patient's PPM site has been examined is healing well without issues at this time. The patient has been seen by Dr. Lovena Le and felt to be stable for discharge today. All follow up appointments have been made. Discharge medications are listed below. Prescriptions have been reviewed with the patient and sent in to their pharmacy.  _____________  Discharge Vitals Blood pressure (!) 155/95, pulse 88, temperature 99 F (37.2 C), temperature source Oral, resp. rate 20, height 4\' 11"  (1.499 m), weight 68.9 kg, SpO2 100 %.  Filed Weights   07/15/18 2235 07/17/18 0449 07/18/18 0315  Weight: 68.9 kg 69.9 kg 68.9 kg    Labs & Radiologic Studies    CBC Recent Labs    07/15/18 1822  WBC 11.0*  HGB 13.6  HCT 43.5  MCV 87.3  PLT 683   Basic Metabolic  Panel Recent Labs    07/17/18 0648 07/18/18 0432  NA 141 138  K 3.8 3.8  CL 107 106  CO2 25 23  GLUCOSE 103* 100*  BUN 15 10  CREATININE 0.68 0.73  CALCIUM 9.4 9.4   Liver Function Tests No results for input(s): AST, ALT, ALKPHOS, BILITOT, PROT, ALBUMIN in the last 72 hours. No results for input(s): LIPASE, AMYLASE in the last 72 hours. Cardiac Enzymes No results for  input(s): CKTOTAL, CKMB, CKMBINDEX, TROPONINI in the last 72 hours. BNP Invalid input(s): POCBNP D-Dimer No results for input(s): DDIMER in the last 72 hours. Hemoglobin A1C Recent Labs    07/16/18 0358  HGBA1C 7.0*   Fasting Lipid Panel No results for input(s): CHOL, HDL, LDLCALC, TRIG, CHOLHDL, LDLDIRECT in the last 72 hours. Thyroid Function Tests No results for input(s): TSH, T4TOTAL, T3FREE, THYROIDAB in the last 72 hours.  Invalid input(s): FREET3 _____________  Dg Chest 2 View  Result Date: 07/17/2018 CLINICAL DATA:  72 year old female with a history of pacemaker placement EXAM: CHEST - 2 VIEW COMPARISON:  PET-CT 01/22/2016, 11/26/2010, plain film 07/17/2010 FINDINGS: Cardiomediastinal silhouette unchanged in size and contour. Calcifications of the aortic arch. Interval placement of left chest wall pacing device with 2 leads in place via subclavian approach. Surgical clips on the left chest. No pneumothorax. No confluent airspace disease or large pleural fluid. No displaced fracture IMPRESSION: Interval placement of left cardiac pacing device with 2 leads and no complicating features. Chronic lung changes. Electronically Signed   By: Corrie Mckusick D.O.   On: 07/17/2018 10:59   Ct Head Wo Contrast  Result Date: 07/15/2018 CLINICAL DATA:  Altered level of consciousness EXAM: CT HEAD WITHOUT CONTRAST TECHNIQUE: Contiguous axial images were obtained from the base of the skull through the vertex without intravenous contrast. COMPARISON:  06/26/2018 FINDINGS: Brain: No acute intracranial abnormality. Specifically, no hemorrhage, hydrocephalus, mass lesion, acute infarction, or significant intracranial injury. There is atrophy and chronic small vessel disease changes. Vascular: No hyperdense vessel or unexpected calcification. Skull: No acute calvarial abnormality. Sinuses/Orbits: Visualized paranasal sinuses and mastoids clear. Orbital soft tissues unremarkable. Other: None IMPRESSION: No  acute intracranial abnormality. Atrophy, chronic microvascular disease. Electronically Signed   By: Rolm Baptise M.D.   On: 07/15/2018 19:36   Mr Jeri Cos FU Contrast  Result Date: 06/26/2018 CLINICAL DATA:  Memory loss and confusion.  Word-finding difficulty. Creatinine was obtained on site at [facility] at [address] Results: Creatinine 0.8 mg/dL. EXAM: MRI HEAD WITHOUT AND WITH CONTRAST TECHNIQUE: Multiplanar, multiecho pulse sequences of the brain and surrounding structures were obtained without and with intravenous contrast. CONTRAST:  80mL MULTIHANCE GADOBENATE DIMEGLUMINE 529 MG/ML IV SOLN COMPARISON:  None. FINDINGS: Brain: Moderate periventricular and subcortical T2 changes are present bilaterally. Mild generalized atrophy is present. No acute infarct, hemorrhage, or mass lesion is present. The ventricles are proportionate to the degree of atrophy. White matter changes extend into the brainstem. Cerebellum is within normal limits. The postcontrast images demonstrate no pathologic enhancement. Vascular: Flow is present in the major intracranial arteries. Skull and upper cervical spine: The skull base is within normal limits. Craniocervical junction is normal. Upper cervical spine is within normal limits. Sinuses/Orbits: The paranasal sinuses are clear. There is some fluid in the mastoid air cells bilaterally. No obstructing nasopharyngeal lesion is present. Globes and orbits are within normal limits. IMPRESSION: 1. Moderate periventricular and subcortical T2 changes bilaterally. This likely reflects the sequela of chronic microvascular ischemia and may contribute  to chronic memory loss. 2. No acute intracranial abnormalities. 3. Bilateral mastoid effusions. No obstructing nasopharyngeal lesion is present. Electronically Signed   By: San Morelle M.D.   On: 06/26/2018 13:56    Diagnostic Studies/Procedures   PPM implantation 07/16/2018: PROCEDURES:  1. Pacemaker  implantation.   INTRODUCTION: CAMILLA SKEEN is a 72 y.o. female with a history of bradycardia who presents today for pacemaker implantation. The patient reports intermittent episodes of dizziness over the past few months. No reversible causes have been identified. The patient therefore presents today for pacemaker implantation.   DESCRIPTION OF PROCEDURE: Informed written consent was obtained, and  the patient was brought to the electrophysiology lab in a fasting state. The patient required no sedation for the procedure today. The patients left chest was prepped and draped in the usual sterile fashion by the EP lab staff. The skin overlying the left deltopectoral region was infiltrated with lidocaine for local analgesia. A 4-cm incision was made over the left deltopectoral region. A left subcutaneous pacemaker pocket was fashioned using a combination of sharp and blunt dissection. Electrocautery was required to assure hemostasis.    RA/RV Lead Placement: The left axillary vein was punctured. Through the left axillary vein, a Medtronic (serial number [id number]) right atrial lead and a Medtronic (serial number [id number]) right ventricular lead were advanced with fluoroscopic visualization into the right atrial appendage and right ventricular/His septal positions respectively. Initial atrial lead P- waves measured 2 mV with impedance of 712 ohms and a threshold of 0.4 V at 0.5 msec. Right ventricular lead R-waves measured 11 mV with an impedance of 763 ohms and a threshold of 0.7 V at 1 msec. Both leads were secured to the pectoralis fascia using #2-0 silk over the suture sleeves.   Device Placement: The leads were then connected to a medtronic (serial number [id number]) pacemaker. The pocket was irrigated with copious gentamicin solution. The pacemaker was then placed into the pocket. The pocket was then closed in 2 layers with 2.0 Vicryl suture for the subcutaneous and subcuticular layers.  Steri-Strips and a sterile dressing were then applied. There were no early apparent complications.   Estimated blood loss <50 mL.  During this procedure no sedation was administered. __________   Echo 07/16/2018: Study Conclusions  - Left ventricle: The cavity size was normal. Systolic function was   normal. The estimated ejection fraction was in the range of 50%   to 55%. Wall motion was normal; there were no regional wall   motion abnormalities. Cannot evaluate of LV diastolic function   due to complete heart block. - Aortic valve: Trileaflet; mildly thickened, mildly calcified   leaflets. - Mitral valve: There was mild regurgitation. - Right ventricle: The cavity size was mildly dilated. Wall   thickness was normal. - Atrial septum: No defect or patent foramen ovale was identified. - Tricuspid valve: Dilated annulus. Normal thickness leaflets.   There was mild regurgitation. Leaflet are poorly visualized, but   based on color doppler flow and continuous regurgitation, concern   that there may be malcoaptiation of the TV leaflets.  Impressions:  - Complete heart block with bradycardia in the low 30s. Grossly   normal EF without obvious wall motion abnormalities. Tricuspid   valve not clearly visualized, but regurgitant flow suggests   possibility of malcoaptation. Elevated CVP but unable to   accurately estimate PASP. _____________  Disposition   Pt is being discharged home today in good condition.  Follow-up Plans & Appointments  Follow-up Information    [facility] Follow up on 07/28/2018.   Specialty:  Cardiology Why:  11;00AM, wound check visit Contact information: [address], [address]       Evans Lance, MD Follow up on 10/20/2018.   Specialty:  Cardiology Why:  10:30AM Contact information: [address] [place] [address] [phone]           Discharge Instructions    Diet - low sodium heart healthy   Complete by:  As directed    Increase activity slowly   Complete by:  As directed       Discharge Medications   Allergies as of 07/18/2018      Reactions   Duloxetine Itching   Cymbalta      Medication List    TAKE these medications   aspirin 81 MG tablet Take 81 mg by mouth 2 (two) times a week.   cyanocobalamin 2000 MCG tablet Take 2,000 mcg by mouth daily.   ergocalciferol 50000 units capsule Commonly known as:  VITAMIN D2 Take 50,000 Units by mouth every Tuesday.   glucose blood test strip 1 each by Other route as needed for other. Use as instructed   metoprolol tartrate 25 MG tablet Commonly known as:  LOPRESSOR Take 1 tablet (25 mg total) by mouth 2 (two) times daily.   naproxen sodium 220 MG tablet Commonly known as:  ALEVE Take 220-440 mg by mouth 2 (two) times daily as needed (for pain).   oxyCODONE-acetaminophen 5-325 MG tablet Commonly known as:  PERCOCET/ROXICET Take 1-2 tablets by mouth every 4 (four) hours as needed (moderate to severe pain).   traMADol 50 MG tablet Commonly known as:  ULTRAM Take 1 tablet (50 mg total) by mouth every 8 (eight) hours as needed.         Aspirin prescribed at discharge?  Yes High Intensity Statin Prescribed? (Lipitor 40-80mg  or Crestor 20-40mg ): No: N/A Beta Blocker Prescribed? Yes For EF <40%, was ACEI/ARB Prescribed? No: EF > 40% ADP Receptor Inhibitor Prescribed? (i.e. Plavix etc.-Includes Medically Managed Patients): No: N/A For EF <40%, Aldosterone Inhibitor Prescribed? No: EF > 40% Was EF assessed during THIS hospitalization? Yes Was Cardiac Rehab II ordered? (Included Medically managed Patients): Yes   Outstanding Labs/Studies   None  Duration of Discharge Encounter   Greater than 30 minutes including physician time.  Signed, Rise Mu, PA-C [facility] Pager: [phone] 07/18/2018, 10:40 AM  EP Attending   Agree  with the findings as noted above. She is better today and is stable for DC with usual followup as noted above.  Mikle Bosworth.D

## 2018-07-18 NOTE — Progress Notes (Signed)
Progress Note  Patient Name: ZO LOUDON Date of Encounter: 07/18/2018  Primary Cardiologist: No primary care provider on file.   Subjective   No chest pain or sob.   Inpatient Medications    Scheduled Meds: . aspirin  81 mg Oral Once per day on Mon Thu  . Chlorhexidine Gluconate Cloth  6 each Topical Daily  . insulin aspart  0-9 Units Subcutaneous TID WC  . metoprolol tartrate  25 mg Oral BID  . mupirocin ointment  1 application Nasal BID  . sodium chloride flush  3 mL Intravenous Q12H   Continuous Infusions: . sodium chloride 250 mL (07/16/18 2121)   PRN Meds: sodium chloride, acetaminophen, ondansetron (ZOFRAN) IV, sodium chloride flush   Vital Signs    Vitals:   07/17/18 2226 07/18/18 0028 07/18/18 0315 07/18/18 0755  BP: (!) 170/93 (!) 141/72 (!) 145/90 (!) 155/95  Pulse: (!) 107 84 95 88  Resp:  (!) 22 (!) 22 20  Temp:   99.5 F (37.5 C) 99 F (37.2 C)  TempSrc:   Oral Oral  SpO2:   98% 100%  Weight:   68.9 kg   Height:        Intake/Output Summary (Last 24 hours) at 07/18/2018 0957 Last data filed at 07/17/2018 1700 Gross per 24 hour  Intake 240 ml  Output -  Net 240 ml   Filed Weights   07/15/18 2235 07/17/18 0449 07/18/18 0315  Weight: 68.9 kg 69.9 kg 68.9 kg    Telemetry    NSR with ventricular pacing - Personally Reviewed  ECG    NSR with ventricular pacing - Personally Reviewed  Physical Exam   GEN: No acute distress.   Neck: No JVD Cardiac: RRR, no murmurs, rubs, or gallops.  Respiratory: Clear to auscultation bilaterally. GI: Soft, nontender, non-distended  MS: No edema; No deformity. Neuro:  Nonfocal but thinks she is in high school.  Psych: Normal affect   Labs    Chemistry Recent Labs  Lab 07/16/18 0358 07/17/18 0648 07/18/18 0432  NA 143 141 138  K 3.7 3.8 3.8  CL 112* 107 106  CO2 25 25 23   GLUCOSE 142* 103* 100*  BUN 16 15 10   CREATININE 0.87 0.68 0.73  CALCIUM 9.1 9.4 9.4  GFRNONAA >60 >60 >60    GFRAA >60 >60 >60  ANIONGAP 6 9 9      Hematology Recent Labs  Lab 07/15/18 1822  WBC 11.0*  RBC 4.98  HGB 13.6  HCT 43.5  MCV 87.3  MCH 27.3  MCHC 31.3  RDW 14.9  PLT 237    Cardiac EnzymesNo results for input(s): TROPONINI in the last 168 hours. No results for input(s): TROPIPOC in the last 168 hours.   BNPNo results for input(s): BNP, PROBNP in the last 168 hours.   DDimer No results for input(s): DDIMER in the last 168 hours.   Radiology    Dg Chest 2 View  Result Date: 07/17/2018 CLINICAL DATA:  72 year old female with a history of pacemaker placement EXAM: CHEST - 2 VIEW COMPARISON:  PET-CT 01/22/2016, 11/26/2010, plain film 07/17/2010 FINDINGS: Cardiomediastinal silhouette unchanged in size and contour. Calcifications of the aortic arch. Interval placement of left chest wall pacing device with 2 leads in place via subclavian approach. Surgical clips on the left chest. No pneumothorax. No confluent airspace disease or large pleural fluid. No displaced fracture IMPRESSION: Interval placement of left cardiac pacing device with 2 leads and no complicating features. Chronic lung changes.  Electronically Signed   By: Corrie Mckusick D.O.   On: 07/17/2018 10:59    Cardiac Studies   none  Patient Profile     72 y.o. female admitted with CHB, s/p PPM with post op confusion. I suspect she has dementia which has been unmasked by her hospitalization and medical illness.  Assessment & Plan    1. CHB - she is stable after PPM. 2. Dyspnea - this appears to be resolved. 3. Confusion - I strongly suspect that this has been a result of underlying and undiagnosed dementia and her being in an unfamiliar place. I would suggest she be allowed to be discharged and referred to neurology as an outpatient but will discuss with family who was not at bedside on my rounds.  Autumn Gunn,M.D.  For questions or updates, please contact [place] Please consult [email or web address] for contact  info under Cardiology/STEMI.      Signed, [name], MD  07/18/2018, 9:57 AM  Patient ID: Sherrill Raring, female   DOB: 11-17-1945, 72 y.o.   MRN: [id number]

## 2018-07-18 NOTE — Progress Notes (Signed)
Pt discharged to home via [facility], accompanied by family, patient and daughter given d/c education with verbal understanding expressed

## 2018-07-28 ENCOUNTER — Ambulatory Visit (INDEPENDENT_AMBULATORY_CARE_PROVIDER_SITE_OTHER): Payer: Medicare Other | Admitting: *Deleted

## 2018-07-28 DIAGNOSIS — Z950 Presence of cardiac pacemaker: Secondary | ICD-10-CM

## 2018-07-28 DIAGNOSIS — I442 Atrioventricular block, complete: Secondary | ICD-10-CM | POA: Diagnosis not present

## 2018-07-28 LAB — CUP PACEART INCLINIC DEVICE CHECK
Battery Remaining Longevity: 74 mo
Battery Voltage: 3.17 V
Brady Statistic AP VP Percent: 51.5 %
Brady Statistic AP VS Percent: 0.01 %
Brady Statistic AS VP Percent: 47.02 %
Brady Statistic AS VS Percent: 1.48 %
Brady Statistic RA Percent Paced: 51.98 %
Brady Statistic RV Percent Paced: 98.52 %
Date Time Interrogation Session: 20191105162240
Implantable Lead Implant Date: 20191024
Implantable Lead Implant Date: 20191024
Implantable Lead Location: 753859
Implantable Lead Location: 753859
Implantable Lead Model: 3830
Implantable Lead Model: 5076
Implantable Pulse Generator Implant Date: 20191024
Lead Channel Impedance Value: 304 Ohm
Lead Channel Impedance Value: 304 Ohm
Lead Channel Impedance Value: 342 Ohm
Lead Channel Impedance Value: 342 Ohm
Lead Channel Impedance Value: 456 Ohm
Lead Channel Impedance Value: 456 Ohm
Lead Channel Impedance Value: 475 Ohm
Lead Channel Impedance Value: 475 Ohm
Lead Channel Pacing Threshold Amplitude: 0.75 V
Lead Channel Pacing Threshold Amplitude: 0.75 V
Lead Channel Pacing Threshold Pulse Width: 0.4 ms
Lead Channel Pacing Threshold Pulse Width: 1 ms
Lead Channel Sensing Intrinsic Amplitude: 3 mV
Lead Channel Setting Pacing Amplitude: 3.5 V
Lead Channel Setting Pacing Amplitude: 3.5 V
Lead Channel Setting Pacing Pulse Width: 1 ms
Lead Channel Setting Sensing Sensitivity: 1.2 mV

## 2018-07-28 NOTE — Progress Notes (Signed)
Wound check appointment. Steri-strips removed. Wound without redness or edema. Incision edges approximated, wound healing well. Normal device function. Thresholds, sensing, and impedances consistent with implant measurements. RV (His) capture appears nonselective until LOC at 0.5V @ 1.45ms. RA output programmed at 3.5V with auto capture programmed on for extra safety margin until 3 month visit; RV (His) output fixed at 3.5V @ 1.36ms (on monitor only) per GT. Histogram distribution appropriate for patient and level of activity. No mode switches or high ventricular rates noted. RA/RV max impedances decreased to 2000ohms, AT/AF daily burden alert turned on. Patient and family educated about wound care, arm mobility, lifting restrictions and Carelink app. ROV with GT on 10/20/18.

## 2018-08-24 ENCOUNTER — Other Ambulatory Visit: Payer: Self-pay | Admitting: *Deleted

## 2018-08-24 MED ORDER — METOPROLOL TARTRATE 25 MG PO TABS: 25.0000 mg | ORAL_TABLET | Freq: Two times a day (BID) | ORAL | 1 refills | Status: DC

## 2018-08-24 NOTE — Telephone Encounter (Signed)
OK to refill under Dr. Lovena Le.

## 2018-09-01 ENCOUNTER — Encounter: Payer: Self-pay | Admitting: Neurology

## 2018-09-01 ENCOUNTER — Ambulatory Visit: Payer: Medicare Other | Admitting: Neurology

## 2018-09-01 VITALS — BP 143/76 | HR 60 | Ht 60.0 in | Wt 149.0 lb

## 2018-09-01 DIAGNOSIS — R413 Other amnesia: Secondary | ICD-10-CM

## 2018-09-01 DIAGNOSIS — E538 Deficiency of other specified B group vitamins: Secondary | ICD-10-CM

## 2018-09-01 MED ORDER — SERTRALINE HCL 25 MG PO TABS: 25.0000 mg | ORAL_TABLET | Freq: Every day | ORAL | 11 refills | Status: DC

## 2018-09-01 NOTE — Progress Notes (Signed)
[facility]    Provider:  Dr Jaynee Eagles Referring Provider: Darcus Austin, MD Primary Care Physician:  Darcus Austin, MD  CC:  Memory loss  HPI:  [name] is a 72 y.o. female here as requested by Dr. Inda Merlin for memory loss. PMHx HTN, edema, DM2, vulvar cancer, history of breast cancer, macular degeneration, osteoarthritis. Patent here with son who provides most information. Short term memory loss unknown length Son is a Biomedical scientist and mother helps, she started asking about directions, about 3 months ago. May have started about a year ago, just really noticed 3 months ago. She tells the same story over and over asks the same questions. She doesn't lose things in the home. She may put something where it doesn't belong but she finds it. She cooks and cleans. No accidents in the house. She lives with her husband who has also noticed. More short-term memory. Also lack of interest. She didn't pay bills for 3 months. Husband pays the jpint bills but her personal bills she didn't pay. Daughter and son now helping. She is not driving, she just stopped. Dr. Inda Merlin told her not to drive until she saw someone for vision loss.   Reviewed notes, labs and imaging from outside physicians, which showed:  Reviewed notes from Dr. Inda Merlin.  At last visit patient was with her daughter to see Dr. Inda Merlin.  Daughter noticed memory issues for the last 6 months and feels they are progressing rapidly.  The patient admits that they have been going on for more than a year.  The memory problems are with short-term memory.  She is still able to take care of herself and drives short distances.  She sometimes have a hard time deciding how to get somewhere but is usually able to figure it out.  Has not forgotten how to do things at home.  She lives with her husband but her daughter usually in the home most days.  Her A1c's of 11 months ago was 8.2.  So apparently not well controlled diabetes.  Mini-Mental status exam was  moderately diminished in the 24-26 range.  Less than 15 animals were recalled on fluency.  She tested B12 which was 295.  Hemoglobin A1c was 6.4.  Vitamin D was extremely low at 18.3.  TSH normal.  BUN 8 and creatinine 0.82.  CBC unremarkable.  These labs were taken June 12, 2018. They deny dementia. Mother and Father with dementia. No alcohol use in the past.    Personally reviewed MRI images and agree with the following:  1. Moderate periventricular and subcortical T2 changes bilaterally. This likely reflects the sequela of chronic microvascular ischemia and may contribute to chronic memory loss. 2. No acute intracranial abnormalities. 3. Bilateral mastoid effusions. No obstructing nasopharyngeal lesion is present.  Review of Systems: Patient complains of symptoms per HPI as well as the following symptoms: memory loss. Pertinent negatives and positives per HPI. All others negative.   Social History   Socioeconomic History  . Marital status: Married    Spouse name: Not on file  . Number of children: 2  . Years of education: Not on file  . Highest education level: Master's degree (e.g., MA, MS, MEng, MEd, MSW, MBA)  Occupational History  . Not on file  Social Needs  . Financial resource strain: Not on file  . Food insecurity:    Worry: Not on file    Inability: Not on file  . Transportation needs:    Medical: Not on file  Non-medical: Not on file  Tobacco Use  . Smoking status: Never Smoker  . Smokeless tobacco: Never Used  Substance and Sexual Activity  . Alcohol use: Never    Frequency: Never  . Drug use: Never  . Sexual activity: Yes  Lifestyle  . Physical activity:    Days per week: Not on file    Minutes per session: Not on file  . Stress: Not on file  Relationships  . Social connections:    Talks on phone: Not on file    Gets together: Not on file    Attends religious service: Not on file    Active member of club or organization: Not on file    Attends  meetings of clubs or organizations: Not on file    Relationship status: Not on file  . Intimate partner violence:    Fear of current or ex partner: Not on file    Emotionally abused: Not on file    Physically abused: Not on file    Forced sexual activity: Not on file  Other Topics Concern  . Not on file  Social History Narrative   Lives at home with husband   Right handed   Caffeine: never    Family History  Problem Relation Age of Onset  . Cancer Maternal Grandmother   . Dementia Mother   . Other Father        possible Dementia    Past Medical History:  Diagnosis Date  . Bradycardia 06/2018  . Breast cancer ([place])   . Cancer [facility]) left    breast cancer 2005 surgery, chemo and radiation   . Diabetes mellitus   . Headache   . Hypercholesteremia   . Macular degeneration   . Osteoarthritis   . 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 Vitamin D deficiency   . Vulvar cancer ([place]) 2017 new dx    Past Surgical History:  Procedure Laterality Date  . ABDOMINAL HYSTERECTOMY     complete  . BREAST SURGERY Left 2006   left breast lumpectomy , chemo and radiation done  . COLONOSCOPY WITH PROPOFOL N/A 07/24/2015   Procedure: COLONOSCOPY WITH PROPOFOL;  Surgeon: Garlan Fair, MD;  Location: [facility];  Service: Endoscopy;  Laterality: N/A;  . EYE SURGERY     removal of growth 35 years ago  . HEMORROIDECTOMY     38 years ago, no present symtoms  . KNEE SURGERY Bilateral   . 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 RADICAL VULVECTOMY N/A 02/01/2016   Procedure: RADICAL VULVECTOMY;  Surgeon: [name], MD;  Location: [facility];  Service: Gynecology;  Laterality: N/A;    Current Outpatient Medications  Medication Sig Dispense Refill  . cyanocobalamin 2000 MCG tablet Take 2,000 mcg by mouth daily.    Marland Kitchen donepezil (ARICEPT) 10 MG tablet Take 10 mg by  mouth at bedtime.    . ergocalciferol (VITAMIN D2) 50000 UNITS capsule Take 50,000 Units by mouth every Tuesday.     . metoprolol tartrate (LOPRESSOR) 25 MG tablet Take 25 mg by mouth 2 (two) times daily.    . sertraline (ZOLOFT) 25 MG tablet Take 1 tablet (25 mg total) by mouth daily. 30 tablet 11   No current facility-administered medications for this visit.     Allergies as of 09/01/2018 - Review Complete 09/01/2018  Allergen Reaction Noted  .  Duloxetine Itching 01/24/2016    Vitals: BP (!) 143/76 (BP Location: Right Arm, Patient Position: Sitting)   Pulse 60   Ht 5' (1.524 m)   Wt 149 lb (67.6 kg)   BMI 29.10 kg/m  Last Weight:  Wt Readings from Last 1 Encounters:  09/01/18 149 lb (67.6 kg)   Last Height:   Ht Readings from Last 1 Encounters:  09/01/18 5' (1.524 m)    Physical exam: Exam: Gen: NAD                  CV: RRR, no MRG. No Carotid Bruits. No peripheral edema, warm, nontender Eyes: Conjunctivae clear without exudates or hemorrhage  Neuro: Detailed Neurologic Exam  Speech:    Speech is normal;  Cognition:  MMSE - Mini Mental State Exam 09/01/2018  Orientation to time 3  Orientation to Place 5  Registration 3  Attention/ Calculation 2  Recall 1  Language- name 2 objects 2  Language- repeat 1  Language- follow 3 step command 3  Language- read & follow direction 1  Write a sentence 1  Copy design 1  Total score 23    Cranial Nerves:    The pupils are equal, round, and reactive to light. Attempted fundoscopic exam could not visualize. Visual fields are full to finger confrontation. Extraocular movements are intact. Trigeminal sensation is intact and the muscles of mastication are normal. The face is symmetric. The palate elevates in the midline. Hearing intact. Voice is normal. Shoulder shrug is normal. The tongue has normal motion without fasciculations.   Coordination:    No dysmetria  Gait:    Normal stride and clearance  Motor  Observation:    no involuntary movements noted. Tone:    Normal muscle tone.    Posture:    Posture is normal. normal erect    Strength:    Strength is V/V in the upper and lower limbs.      Sensation: intact to LT     Reflex Exam:  DTR's:    Deep tendon reflexes in the upper and lower extremities are symmetrical bilaterally.   Toes:    The toes are equiv bilaterally.   Clonus:    Clonus is absent.      Assessment/Plan:   72 y.o. female here as requested by Dr. Inda Merlin for memory loss. PMHx HTN, edema, DM2, vulvar cancer, history of breast cancer, macular degeneration, osteoarthritis. Short term memory loss. Mother had dementia.   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 She is not eating, decreased socialization. Will send to [facility]. Start zoloft. - MRI of the brain with mod-severe white matter changes which may raise suspicion of a vascular component.  - continue Aricept. Consider Namenda at next appointment. - Labs today - Formal memory testing (MMSE 23)  Orders Placed This Encounter  Procedures  . B12 and Folate Panel  . Methylmalonic acid, serum  . Vitamin B1  . Basic Metabolic Panel  . Homocysteine  . Ambulatory referral to Neuropsychology      Sarina Ill, MD  [facility] [address] [place], [place] [address]  Phone [phone] Fax [phone]

## 2018-09-01 NOTE — Patient Instructions (Addendum)
Sertraline tablets What is this medicine? SERTRALINE (SER tra leen) is used to treat depression. It may also be used to treat obsessive compulsive disorder, panic disorder, post-trauma stress, premenstrual dysphoric disorder (PMDD) or social anxiety. This medicine may be used for other purposes; ask your health care provider or pharmacist if you have questions. COMMON BRAND NAME(S): Zoloft What should I tell my health care provider before I take this medicine? They need to know if you have any of these conditions: -bleeding disorders -bipolar disorder or a family history of bipolar disorder -glaucoma -heart disease -high blood pressure -history of irregular heartbeat -history of low levels of calcium, magnesium, or potassium in the blood -if you often drink alcohol -liver disease -receiving electroconvulsive therapy -seizures -suicidal thoughts, plans, or attempt; a previous suicide attempt by you or a family member -take medicines that treat or prevent blood clots -thyroid disease -an unusual or allergic reaction to sertraline, other medicines, foods, dyes, or preservatives -pregnant or trying to get pregnant -breast-feeding How should I use this medicine? Take this medicine by mouth with a glass of water. Follow the directions on the prescription label. You can take it with or without food. Take your medicine at regular intervals. Do not take your medicine more often than directed. Do not stop taking this medicine suddenly except upon the advice of your doctor. Stopping this medicine too quickly may cause serious side effects or your condition may worsen. A special MedGuide will be given to you by the pharmacist with each prescription and refill. Be sure to read this information carefully each time. Talk to your pediatrician regarding the use of this medicine in children. While this drug may be prescribed for children as young as 7 years for selected conditions, precautions do  apply. Overdosage: If you think you have taken too much of this medicine contact a poison control center or emergency room at once. NOTE: This medicine is only for you. Do not share this medicine with others. What if I miss a dose? If you miss a dose, take it as soon as you can. If it is almost time for your next dose, take only that dose. Do not take double or extra doses. What may interact with this medicine? Do not take this medicine with any of the following medications: -cisapride -dofetilide -dronedarone -linezolid -MAOIs like Carbex, Eldepryl, Marplan, Nardil, and Parnate -methylene blue (injected into a vein) -pimozide -thioridazine This medicine may also interact with the following medications: -alcohol -amphetamines -aspirin and aspirin-like medicines -certain medicines for depression, anxiety, or psychotic disturbances -certain medicines for fungal infections like ketoconazole, fluconazole, posaconazole, and itraconazole -certain medicines for irregular heart beat like flecainide, quinidine, propafenone -certain medicines for migraine headaches like almotriptan, eletriptan, frovatriptan, naratriptan, rizatriptan, sumatriptan, zolmitriptan -certain medicines for sleep -certain medicines for seizures like carbamazepine, valproic acid, phenytoin -certain medicines that treat or prevent blood clots like warfarin, enoxaparin, dalteparin -cimetidine -digoxin -diuretics -fentanyl -isoniazid -lithium -NSAIDs, medicines for pain and inflammation, like ibuprofen or naproxen -other medicines that prolong the QT interval (cause an abnormal heart rhythm) -rasagiline -safinamide -supplements like St. John's wort, kava kava, valerian -tolbutamide -tramadol -tryptophan This list may not describe all possible interactions. Give your health care provider a list of all the medicines, herbs, non-prescription drugs, or dietary supplements you use. Also tell them if you smoke, drink  alcohol, or use illegal drugs. Some items may interact with your medicine. What should I watch for while using this medicine? Tell your doctor if your  symptoms do not get better or if they get worse. Visit your doctor or health care professional for regular checks on your progress. Because it may take several weeks to see the full effects of this medicine, it is important to continue your treatment as prescribed by your doctor. Patients and their families should watch out for new or worsening thoughts of suicide or depression. Also watch out for sudden changes in feelings such as feeling anxious, agitated, panicky, irritable, hostile, aggressive, impulsive, severely restless, overly excited and hyperactive, or not being able to sleep. If this happens, especially at the beginning of treatment or after a change in dose, call your health care professional. Dennis Bast may get drowsy or dizzy. Do not drive, use machinery, or do anything that needs mental alertness until you know how this medicine affects you. Do not stand or sit up quickly, especially if you are an older patient. This reduces the risk of dizzy or fainting spells. Alcohol may interfere with the effect of this medicine. Avoid alcoholic drinks. Your mouth may get dry. Chewing sugarless gum or sucking hard candy, and drinking plenty of water may help. Contact your doctor if the problem does not go away or is severe. What side effects may I notice from receiving this medicine? Side effects that you should report to your doctor or health care professional as soon as possible: -allergic reactions like skin rash, itching or hives, swelling of the face, lips, or tongue -anxious -black, tarry stools -changes in vision -confusion -elevated mood, decreased need for sleep, racing thoughts, impulsive behavior -eye pain -fast, irregular heartbeat -feeling faint or lightheaded, falls -feeling agitated, angry, or irritable -hallucination, loss of contact with  reality -loss of balance or coordination -loss of memory -painful or prolonged erections -restlessness, pacing, inability to keep still -seizures -stiff muscles -suicidal thoughts or other mood changes -trouble sleeping -unusual bleeding or bruising -unusually weak or tired -vomiting Side effects that usually do not require medical attention (report to your doctor or health care professional if they continue or are bothersome): -change in appetite or weight -change in sex drive or performance -diarrhea -increased sweating -indigestion, nausea -tremors This list may not describe all possible side effects. Call your doctor for medical advice about side effects. You may report side effects to FDA at 1-800-FDA-1088. Where should I keep my medicine? Keep out of the reach of children. Store at room temperature between 15 and 30 degrees C (59 and 86 degrees F). Throw away any unused medicine after the expiration date. NOTE: This sheet is a summary. It may not cover all possible information. If you have questions about this medicine, talk to your doctor, pharmacist, or health care provider.  2018 Elsevier/Gold Standard (2016-09-13 14:17:49)   Dementia Dementia is the loss of two or more brain functions, such as:  Memory.  Decision making.  Behavior.  Speaking.  Thinking.  Problem solving.  There are many types of dementia. The most common type is called progressive dementia. Progressive dementia gets worse with time and it is irreversible. An example of this type of dementia is Alzheimer disease. What are the causes? This condition may be caused by:  Nerve cell damage in the brain.  Genetic mutations.  Certain medicines.  Multiple small strokes.  An infection, such as chronic meningitis.  A metabolic problem, such as vitamin B12 deficiency or thyroid disease.  Pressure on the brain, such as from a tumor or blood clot.  What are the signs or symptoms? Symptoms of  this condition include:  Sudden changes in mood.  Depression.  Problems with balance.  Changes in personality.  Poor short-term memory.  Agitation.  Delusions.  Hallucinations.  Having a hard time: ? Speaking thoughts. ? Finding words. ? Solving problems. ? Doing familiar tasks. ? Understanding familiar ideas.  How is this diagnosed? This condition is diagnosed with an assessment by your health care provider. During this assessment, your health care provider will talk with you and your family, friends, or caregivers about your symptoms. A thorough medical history will be taken, and you will have a physical exam and tests. Tests may include:  Lab tests, such as blood or urine tests.  Imaging tests, such as a CT scan, PET scan, or MRI.  A lumbar puncture. This test involves removing and testing a small amount of the fluid that surrounds the brain and spinal cord.  An electroencephalogram (EEG). In this test, small metal discs are used to measure electrical activity in the brain.  Memory tests, cognitive tests, and neuropsychological tests. These tests evaluate brain function.  How is this treated? Treatment depends on the cause of the dementia. It may involve taking medicines that may help:  To control the dementia.  To slow down the disease.  To manage symptoms.  In some cases, treating the cause of the dementia can improve symptoms, reverse symptoms, or slow down how quickly the dementia gets worse. Your health care provider can help direct you to support groups, organizations, and other health care providers who can help with decisions about your care. Follow these instructions at home: Medicine  Take over-the-counter and prescription medicines only as told by your health care provider.  Avoid taking medicines that can affect thinking, such as pain or sleeping medicines. Lifestyle   Make healthy lifestyle choices: ? Be physically active as told by your  health care provider. ? Do not use any tobacco products, such as cigarettes, chewing tobacco, and e-cigarettes. If you need help quitting, ask your health care provider. ? Eat a healthy diet. ? Practice stress-management techniques when you get stressed. ? Stay social.  Drink enough fluid to keep your urine clear or pale yellow.  Make sure to get quality sleep. These tips can help you to get a good night's rest: ? Avoid napping during the day. ? Keep your sleeping area dark and cool. ? Avoid exercising during the few hours before you go to bed. ? Avoid caffeine products in the evening. General instructions  Work with your health care provider to determine what you need help with and what your safety needs are.  If you were given a bracelet that tracks your location, make sure to wear it.  Keep all follow-up visits as told by your health care provider. This is important. Contact a health care provider if:  You have any new symptoms.  You have problems with choking or swallowing.  You have any symptoms of a different illness. Get help right away if:  You develop a fever.  You have new or worsening confusion.  You have new or worsening sleepiness.  You have a hard time staying awake.  You or your family members become concerned for your safety. This information is not intended to replace advice given to you by your health care provider. Make sure you discuss any questions you have with your health care provider. Document Released: 03/05/2001 Document Revised: 01/18/2016 Document Reviewed: 06/07/2015 Elsevier Interactive Patient Education  [employer].

## 2018-09-03 ENCOUNTER — Encounter: Payer: Self-pay | Admitting: Neurology

## 2018-09-03 DIAGNOSIS — R413 Other amnesia: Secondary | ICD-10-CM | POA: Insufficient documentation

## 2018-09-08 ENCOUNTER — Telehealth: Payer: Self-pay | Admitting: *Deleted

## 2018-09-08 LAB — BASIC METABOLIC PANEL
BUN/Creatinine Ratio: 17 (ref 12–28)
BUN: 13 mg/dL (ref 8–27)
CO2: 24 mmol/L (ref 20–29)
Calcium: 9.8 mg/dL (ref 8.7–10.3)
Chloride: 100 mmol/L (ref 96–106)
Creatinine, Ser: 0.76 mg/dL (ref 0.57–1.00)
GFR calc Af Amer: 89 mL/min/{1.73_m2} (ref >59)
GFR calc non Af Amer: 78 mL/min/{1.73_m2} (ref >59)
Glucose: 86 mg/dL (ref 65–99)
Potassium: 3.8 mmol/L (ref 3.5–5.2)
Sodium: 139 mmol/L (ref 134–144)

## 2018-09-08 LAB — METHYLMALONIC ACID, SERUM: Methylmalonic Acid: 138 nmol/L (ref 0–378)

## 2018-09-08 LAB — B12 AND FOLATE PANEL
Folate: 9.7 ng/mL (ref >3.0)
Vitamin B-12: 1796 pg/mL — ABNORMAL HIGH (ref 232–1245)

## 2018-09-08 LAB — HOMOCYSTEINE: Homocysteine: 11.7 umol/L (ref 0.0–15.0)

## 2018-09-08 LAB — VITAMIN B1: Thiamine: 100.5 nmol/L (ref 66.5–200.0)

## 2018-09-08 NOTE — Telephone Encounter (Signed)
Spoke with patient and pt's son. Informed them that labs are unremarkable. Advised that she can decrease the amount of B12 that she is taking. The level is elevated but that is not necessarily harmful, maybe she can just decrease the amount. Pt's son and pt verbalized understanding. He stated that pt currently takes two 1000 mcg dissolvable tablets of B12 daily. Discussed with pt's son that pt could try just taking one tablet instead. RN advised that she would send message to Dr. Jaynee Eagles with the change and will call back if needed. He verbalized appreciation. Pt's next appt scheduled for 03/02/19. Pt has not heard from Dr. Fernande Bras office yet. RN advised that the referral was just sent a few days ago so it may take a little longer  However asked for return call if they have not heard within a week or so. They verbalized appreciation.

## 2018-09-08 NOTE — Telephone Encounter (Signed)
-----   Message from Melvenia Beam, MD sent at 09/08/2018 10:06 AM EST ----- Labs are unremarkable.  She can decrease the amount of B12 she is taking the level is elevated but that is not necessarily harmful maybe she can just reduce the amount thanks

## 2018-09-12 ENCOUNTER — Encounter (HOSPITAL_COMMUNITY): Payer: Self-pay

## 2018-09-12 ENCOUNTER — Emergency Department (HOSPITAL_COMMUNITY): Payer: Medicare Other

## 2018-09-12 ENCOUNTER — Other Ambulatory Visit: Payer: Self-pay

## 2018-09-12 ENCOUNTER — Emergency Department (HOSPITAL_COMMUNITY)
Admission: EM | Admit: 2018-09-12 | Discharge: 2018-09-12 | Disposition: A | Discharge status: Home / Self Care | Payer: Medicare Other | Attending: Emergency Medicine | Admitting: Emergency Medicine

## 2018-09-12 DIAGNOSIS — E119 Type 2 diabetes mellitus without complications: Secondary | ICD-10-CM | POA: Insufficient documentation

## 2018-09-12 DIAGNOSIS — Z79899 Other long term (current) drug therapy: Secondary | ICD-10-CM | POA: Insufficient documentation

## 2018-09-12 DIAGNOSIS — E7800 Pure hypercholesterolemia, unspecified: Secondary | ICD-10-CM | POA: Diagnosis not present

## 2018-09-12 DIAGNOSIS — Z853 Personal history of malignant neoplasm of breast: Secondary | ICD-10-CM | POA: Insufficient documentation

## 2018-09-12 DIAGNOSIS — Z45018 Encounter for adjustment and management of other part of cardiac pacemaker: Secondary | ICD-10-CM | POA: Diagnosis present

## 2018-09-12 HISTORY — DX: Encounter for adjustment and management of other part of cardiac pacemaker: Z45.018

## 2018-09-12 NOTE — ED Triage Notes (Signed)
Pt noticed last week didn't hear noise or feel pacer moving. This week it seemed to stop all together. Pt couldn't sleep last night so she came today to get it checked out.

## 2018-09-12 NOTE — ED Notes (Signed)
Patient returned from X-ray 

## 2018-09-12 NOTE — ED Notes (Signed)
Patient verbalizes understanding of discharge instructions. Opportunity for questioning and answers were provided. Armband removed by staff, pt discharged from ED ambulatory with female companion.  

## 2018-09-12 NOTE — ED Notes (Signed)
Ken from [employer] called to report pacer is functioning as it should. 100% v paced, a few short episodes of atrial rate in 140-50's on 12.17 and 12/18.

## 2018-09-12 NOTE — ED Provider Notes (Signed)
[facility] EMERGENCY DEPARTMENT Provider Note   CSN: [id number] Arrival date & time: 09/12/18  1111     History   Chief Complaint Chief Complaint  Patient presents with  . Pacemaker Check    HPI [name] is a 72 y.o. female.  HPI   Felt like when pacer was first put in could feel it beating, hear a noise Last week noticed that it stopped No chest pain or dyspnea, no palpitations or heart racing No nausea or vomiting No trauma to the chest  Started sertraline, zoloft recently for depressoin  Past Medical History:  Diagnosis Date  . Bradycardia 06/2018  . Breast cancer ([place])   . Cancer [facility]) left    breast cancer 2005 surgery, chemo and radiation   . Diabetes mellitus   . Headache   . Hypercholesteremia   . Macular degeneration   . Osteoarthritis   . 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 Vitamin D deficiency   . Vulvar cancer ([place]) 2017 new dx    Patient Active Problem List   Diagnosis Date Noted  . Memory loss 09/03/2018  . Bradycardia 07/15/2018  . Vulval lesion 02/01/2016  . Vulvar cancer ([place]) 01/10/2016  . 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 DIABETES, TYPE 2 11/28/2008  . COUGH 11/28/2008  . NEOPLASM, MALIGNANT, BREAST, HX OF 11/28/2008    Past Surgical History:  Procedure Laterality Date  . ABDOMINAL HYSTERECTOMY     complete  . BREAST SURGERY Left 2006   left breast lumpectomy , chemo and radiation done  . COLONOSCOPY WITH PROPOFOL N/A 07/24/2015   Procedure: COLONOSCOPY WITH PROPOFOL;  Surgeon: Garlan Fair, MD;  Location: [facility]OS[facility];  Service: Endoscopy;  Laterality: N/A;  . EYE SURGERY     removal of growth 35 years ago  . HEMORROIDECTOMY     38 years ago, no present symtoms  . KNEE SURGERY Bilateral   . PACEMAKER IMPLANT N/A  07/16/2018   Procedure: PACEMAKER IMPLANT;  Surgeon: Evans Lance, MD;  Location: [facility] CV LAB;  Service: Cardiovascular;  Laterality: N/A;  . RADICAL VULVECTOMY N/A 02/01/2016   Procedure: RADICAL VULVECTOMY;  Surgeon: [name], MD;  Location: [facility] ORS;  Service: Gynecology;  Laterality: N/A;     OB History   No obstetric history on file.      Home Medications    Prior to Admission medications   Medication Sig Start Date End Date Taking? Authorizing Provider  Cyanocobalamin (VITAMIN B 12 PO) Take 1,000 mcg by mouth daily.   Yes [provider]  donepezil (ARICEPT) 10 MG tablet Take 10 mg by mouth at bedtime.   Yes [provider]  ergocalciferol (VITAMIN D2) 50000 UNITS capsule Take 50,000 Units by mouth every Tuesday.    Yes [provider]  metoprolol tartrate (LOPRESSOR) 25 MG tablet Take 25 mg by mouth 2 (two) times daily.   Yes [provider]  Omega-3 Fatty Acids (FISH OIL) 1200 MG CAPS Take 2 capsules by mouth every morning.   Yes [provider]  sertraline (ZOLOFT) 25 MG tablet Take 1 tablet (25 mg total) by mouth daily. 09/01/18  Yes Melvenia Beam, MD    Family History Family History  Problem Relation Age of Onset  . Cancer Maternal Grandmother   .  Dementia Mother   . Other Father        possible Dementia    Social History Social History   Tobacco Use  . Smoking status: Never Smoker  . Smokeless tobacco: Never Used  Substance Use Topics  . Alcohol use: Never    Frequency: Never  . Drug use: Never     Allergies   Duloxetine   Review of Systems Review of Systems  Constitutional: Negative for fever.  Respiratory: Negative for cough and shortness of breath.   Cardiovascular: Negative for chest pain.  Gastrointestinal: Negative for nausea and vomiting.  Genitourinary: Negative for difficulty urinating.  Neurological: Negative for syncope and headaches.     Physical Exam Updated Vital Signs BP  (!) 170/77   Pulse (!) 59   Temp 98.1 F (36.7 C) (Oral)   Resp 16   Ht 5' (1.524 m)   Wt 67.9 kg   SpO2 99%   BMI 29.22 kg/m   Physical Exam Vitals signs and nursing note reviewed.  Constitutional:      General: She is not in acute distress.    Appearance: She is well-developed. She is not diaphoretic.  HENT:     Head: Normocephalic and atraumatic.  Eyes:     Conjunctiva/sclera: Conjunctivae normal.  Neck:     Musculoskeletal: Normal range of motion.  Cardiovascular:     Rate and Rhythm: Normal rate and regular rhythm.     Heart sounds: Normal heart sounds. No murmur. No friction rub. No gallop.      Comments: Pacer site without erythema or swelling Pulmonary:     Effort: Pulmonary effort is normal. No respiratory distress.     Breath sounds: Normal breath sounds. No wheezing or rales.  Abdominal:     General: There is no distension.     Palpations: Abdomen is soft.     Tenderness: There is no abdominal tenderness. There is no guarding.  Musculoskeletal:        General: Swelling present.  Skin:    General: Skin is warm and dry.     Findings: No erythema or rash.  Neurological:     Mental Status: She is alert and oriented to person, place, and time.      ED Treatments / Results  Labs (all labs ordered are listed, but only abnormal results are displayed) Labs Reviewed - No data to display  EKG None  Radiology Dg Chest 2 View  Result Date: 09/12/2018 CLINICAL DATA:  Recent pacemaker placement.  Evaluate placement. EXAM: CHEST - 2 VIEW COMPARISON:  07/17/2018 FINDINGS: The heart size and mediastinal contours are within normal limits. Dual lead transvenous pacemaker remains in appropriate position. Both lungs are clear. Surgical clips again seen in left axilla. The visualized skeletal structures are unremarkable. IMPRESSION: No active cardiopulmonary disease. Electronically Signed   By: Earle Gell M.D.   On: 09/12/2018 13:14    Procedures Procedures (including  critical care time)  Medications Ordered in ED Medications - No data to display   Initial Impression / Assessment and Plan / ED Course  I have reviewed the triage vital signs and the nursing notes.  Pertinent labs & imaging results that were available during my care of the patient were reviewed by me and considered in my medical decision making (see chart for details).     72yo female with history above presents with concern for not being able to feel or hear her pacemaker. Reports she was able to until last week and  given changed sensation wanted to be checked out.  Discussed it is unusual to feel a pacemaker and it is unclear what is causing the change in sensation for her. No dyspnea or chest pain. Pacer interrogated and is functioning normally. Brief episode of tachycardia noted on 12/17.  EKG without signs of acute ischemia or arrhythmia.  CXR done to check PM placement and shows in appropriate position. Patient discharged in stable condition with understanding of reasons to return.   Final Clinical Impressions(s) / ED Diagnoses   Final diagnoses:  Biventricular pacemaker check    ED Discharge Orders    None       Gareth Morgan, MD 09/12/18 1320

## 2018-09-12 NOTE — ED Notes (Signed)
Patient ambulatory to bathroom with steady gait at this time 

## 2018-09-29 ENCOUNTER — Telehealth: Payer: Self-pay

## 2018-09-29 MED ORDER — METOPROLOL TARTRATE 25 MG PO TABS: 25.0000 mg | ORAL_TABLET | Freq: Two times a day (BID) | ORAL | 0 refills | Status: DC

## 2018-09-29 NOTE — Telephone Encounter (Signed)
Pt is requesting a refill on metoprolol tartrate 25mg , and has an upcoming appt with Dr. Lovena Le. Would you like to refill this medication? Please address, thank you.

## 2018-09-29 NOTE — Telephone Encounter (Signed)
Medication refilled as requested.

## 2018-09-30 ENCOUNTER — Other Ambulatory Visit: Payer: Self-pay | Admitting: *Deleted

## 2018-09-30 MED ORDER — SERTRALINE HCL 25 MG PO TABS: 25.0000 mg | ORAL_TABLET | Freq: Every day | ORAL | 2 refills | Status: DC

## 2018-09-30 NOTE — Telephone Encounter (Signed)
90 day supply request from [name] for sertraline 25 mg. Approved by Dr. Jaynee Eagles.

## 2018-10-20 ENCOUNTER — Encounter: Payer: Self-pay | Admitting: Internal Medicine

## 2018-10-20 ENCOUNTER — Ambulatory Visit: Payer: Medicare Other | Admitting: Internal Medicine

## 2018-10-20 VITALS — BP 124/68 | HR 73 | Ht 60.0 in | Wt 146.2 lb

## 2018-10-20 DIAGNOSIS — Z950 Presence of cardiac pacemaker: Secondary | ICD-10-CM

## 2018-10-20 DIAGNOSIS — I442 Atrioventricular block, complete: Secondary | ICD-10-CM

## 2018-10-20 MED ORDER — FLECAINIDE ACETATE 50 MG PO TABS: 50.0000 mg | ORAL_TABLET | Freq: Two times a day (BID) | ORAL | 3 refills | Status: AC

## 2018-10-20 NOTE — Progress Notes (Signed)
HPI [name] returns today for ongoing evaluation and management of her PPM. She is a pleasant 73 yo woman with ah/o breast CA, s/p treatment, DM, and CHB, s/p PPM insertion approximately 3 months ago. In the interim she has had problems with anxiety and visual hallucinations. She has chronic dyspnea. She has occaisional palpitations. No syncope.  Allergies  Allergen Reactions  . Duloxetine Itching    Cymbalta     Current Outpatient Medications  Medication Sig Dispense Refill  . Cyanocobalamin (VITAMIN B 12 PO) Take 1,000 mcg by mouth daily.    Marland Kitchen donepezil (ARICEPT) 10 MG tablet Take 10 mg by mouth at bedtime.    . ergocalciferol (VITAMIN D2) 50000 UNITS capsule Take 50,000 Units by mouth every Tuesday.     . metoprolol tartrate (LOPRESSOR) 25 MG tablet Take 1 tablet (25 mg total) by mouth 2 (two) times daily. 180 tablet 0  . Omega-3 Fatty Acids (FISH OIL) 1200 MG CAPS Take 2 capsules by mouth every morning.    . sertraline (ZOLOFT) 25 MG tablet Take 1 tablet (25 mg total) by mouth daily. 90 tablet 2  . flecainide (TAMBOCOR) 50 MG tablet Take 1 tablet (50 mg total) by mouth 2 (two) times daily. 180 tablet 3   No current facility-administered medications for this visit.      Past Medical History:  Diagnosis Date  . Bradycardia 06/2018  . Breast cancer ([place])   . Cancer [facility]) left    breast cancer 2005 surgery, chemo and radiation   . Diabetes mellitus   . Headache   . Hypercholesteremia   . Macular degeneration   . Osteoarthritis   . 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 Positive PPD    h/o, age 38  . Vitamin D deficiency   . Vulvar cancer ([place]) 2017 new dx    ROS:   All systems reviewed and negative except as noted in the HPI.   Past Surgical History:  Procedure Laterality Date  . ABDOMINAL HYSTERECTOMY     complete  . BREAST SURGERY Left 2006   left breast lumpectomy , chemo and radiation done  . COLONOSCOPY WITH PROPOFOL N/A 07/24/2015   Procedure: COLONOSCOPY WITH PROPOFOL;  Surgeon: Garlan Fair, MD;  [facility];  Service: Endoscopy;  Laterality: N/A;  . EYE SURGERY     removal of growth 35 years ago  . HEMORROIDECTOMY     38 years ago, no present symtoms  . KNEE SURGERY Bilateral   . PACEMAKER IMPLANT N/A 07/16/2018   Procedure: PACEMAKER IMPLANT;  Surgeon: Evans Lance, MD;  Location: [facility] CV LAB;  Service: Cardiovascular;  Laterality: N/A;  . RADICAL VULVECTOMY N/A 02/01/2016   Procedure: RADICAL VULVECTOMY;  Surgeon: [name], MD;  Location: [facility] OR[facility];  Service: Gynecology;  Laterality: N/A;     Family History  Problem Relation Age of Onset  . Cancer Maternal Grandmother   . Dementia Mother   . Other Father        possible Dementia     Social History   Socioeconomic History  . Marital status: Married    Spouse name: Not on file  . Number of children: 2  . Years of education: Not on file  . Highest education level: Master's degree (e.g., MA, MS, MEng, MEd, MSW, MBA)  Occupational History  . Not on file  Social Needs  . Financial resource strain: Not on file  . Food insecurity:    Worry: Not on  file    Inability: Not on file  . Transportation needs:    Medical: Not on file    Non-medical: Not on file  Tobacco Use  . Smoking status: Never Smoker  . Smokeless tobacco: Never Used  Substance and Sexual Activity  . Alcohol use: Never    Frequency: Never  . Drug use: Never  . Sexual activity: Yes  Lifestyle  . Physical activity:    Days per week: Not on file    Minutes per session: Not on file  . Stress: Not on file  Relationships  . Social connections:    Talks on phone: Not on file    Gets together: Not on file    Attends religious service: Not on file    Active member of club or organization: Not on file    Attends meetings of clubs or organizations: Not on file    Relationship status: Not on file  . Intimate partner violence:    Fear of current or ex partner: Not  on file    Emotionally abused: Not on file    Physically abused: Not on file    Forced sexual activity: Not on file  Other Topics Concern  . Not on file  Social History Narrative   Lives at home with husband   Right handed   Caffeine: never     BP 124/68   Pulse 73   Ht 5' (1.524 m)   Wt 146 lb 3.2 oz (66.3 kg)   SpO2 98%   BMI 28.55 kg/m   Physical Exam:  Well appearing NAD HEENT: Unremarkable Neck:  No JVD, no thyromegally Lymphatics:  No adenopathy Back:  No CVA tenderness Lungs:  Clear with no wheezes HEART:  Regular rate rhythm, no murmurs, no rubs, no clicks Abd:  soft, positive bowel sounds, no organomegally, no rebound, no guarding Ext:  2 plus pulses, no edema, no cyanosis, no clubbing Skin:  No rashes no nodules Neuro:  CN II through XII intact, motor grossly intact  EKG - nsr with His bundle pacing  DEVICE  Normal device function.  See PaceArt for details.   Assess/Plan: 1. CHB - she is asymptomatic, s/p PPM insertion. 2. HTN - her blood pressure is well controlled today. 3. 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 We have turned her outputs down. 4. Visual hallucinations/anxiety - I encouraged her to contact her primary MD for additional followup and referral.  [name]

## 2018-10-20 NOTE — Patient Instructions (Addendum)
Medication Instructions:  Your physician has recommended you make the following change in your medication:   1.  Start taking flecainide 50 mg--Take one tablet by mouth twice a day  Labwork: None ordered.  Testing/Procedures: None ordered.  Follow-Up: Your physician wants you to follow-up in: 3 months with Dr. Lovena Le.      Remote monitoring is used to monitor your Pacemaker from home. This monitoring reduces the number of office visits required to check your device to one time per year. It allows [place] to keep an eye on the functioning of your device to ensure it is working properly. You are scheduled for a device check from home on 01/19/2019. You may send your transmission at any time that day. If you have a wireless device, the transmission will be sent automatically. After your physician reviews your transmission, you will receive a postcard with your next transmission date.  Any Other Special Instructions Will Be Listed Below (If Applicable).  If you need a refill on your cardiac medications before your next appointment, please call your pharmacy.    Flecainide tablets What is this medicine? FLECAINIDE (FLEK a nide) is an antiarrhythmic drug. This medicine is used to prevent irregular heart rhythm. It can also slow down fast heartbeats called tachycardia. This medicine may be used for other purposes; ask your health care provider or pharmacist if you have questions. COMMON BRAND NAME(S): Tambocor What should I tell my health care provider before I take this medicine? They need to know if you have any of these conditions: -abnormal levels of potassium in the blood -heart disease including heart rhythm and heart rate problems -kidney or liver disease -recent heart attack -an unusual or allergic reaction to flecainide, local anesthetics, other medicines, foods, dyes, or preservatives -pregnant or trying to get pregnant -breast-feeding How should I use this medicine? Take this  medicine by mouth with a glass of water. Follow the directions on the prescription label. You can take this medicine with or without food. Take your doses at regular intervals. Do not take your medicine more often than directed. Do not stop taking this medicine suddenly. This may cause serious, heart-related side effects. If your doctor wants you to stop the medicine, the dose may be slowly lowered over time to avoid any side effects. Talk to your pediatrician regarding the use of this medicine in children. While this drug may be prescribed for children as young as 1 year of age for selected conditions, precautions do apply. Overdosage: If you think you have taken too much of this medicine contact a poison control center or emergency room at once. NOTE: This medicine is only for you. Do not share this medicine with others. What if I miss a dose? If you miss a dose, take it as soon as you can. If it is almost time for your next dose, take only that dose. Do not take double or extra doses. What may interact with this medicine? Do not take this medicine with any of the following medications: -amoxapine -arsenic trioxide -certain antibiotics like clarithromycin, erythromycin, gatifloxacin, gemifloxacin, levofloxacin, moxifloxacin, sparfloxacin, or troleandomycin -certain antidepressants called tricyclic antidepressants like amitriptyline, imipramine, or nortriptyline -certain medicines to control heart rhythm like disopyramide, dofetilide, encainide, moricizine, procainamide, propafenone, and quinidine -cisapride -cyclobenzaprine -delavirdine -droperidol -haloperidol -hawthorn -imatinib -levomethadyl -maprotiline -medicines for malaria like chloroquine and halofantrine -pentamidine -phenothiazines like chlorpromazine, mesoridazine, prochlorperazine, thioridazine -pimozide -quinine -ranolazine -ritonavir -sertindole -ziprasidone This medicine may also interact with the following  medications: -cimetidine -medicines for  angina or high blood pressure -medicines to control heart rhythm like amiodarone and digoxin This list may not describe all possible interactions. Give your health care provider a list of all the medicines, herbs, non-prescription drugs, or dietary supplements you use. Also tell them if you smoke, drink alcohol, or use illegal drugs. Some items may interact with your medicine. What should I watch for while using this medicine? Visit your doctor or health care professional for regular checks on your progress. Because your condition and the use of this medicine carries some risk, it is a good idea to carry an identification card, necklace or bracelet with details of your condition, medications and doctor or health care professional. Check your blood pressure and pulse rate regularly. Ask your health care professional what your blood pressure and pulse rate should be, and when you should contact him or her. Your doctor or health care professional also may schedule regular blood tests and electrocardiograms to check your progress. You may get drowsy or dizzy. Do not drive, use machinery, or do anything that needs mental alertness until you know how this medicine affects you. Do not stand or sit up quickly, especially if you are an older patient. This reduces the risk of dizzy or fainting spells. Alcohol can make you more dizzy, increase flushing and rapid heartbeats. Avoid alcoholic drinks. What side effects may I notice from receiving this medicine? Side effects that you should report to your doctor or health care professional as soon as possible: -chest pain, continued irregular heartbeats -difficulty breathing -swelling of the legs or feet -trembling, shaking -unusually weak or tired Side effects that usually do not require medical attention (report to your doctor or health care professional if they continue or are bothersome): -blurred  vision -constipation -headache -nausea, vomiting -stomach pain This list may not describe all possible side effects. Call your doctor for medical advice about side effects. You may report side effects to FDA at 1-800-FDA-1088. Where should I keep my medicine? Keep out of the reach of children. Store at room temperature between 15 and 30 degrees C (59 and 86 degrees F). Protect from light. Keep container tightly closed. Throw away any unused medicine after the expiration date. NOTE: This sheet is a summary. It may not cover all possible information. If you have questions about this medicine, talk to your doctor, pharmacist, or health care provider.  2019 Elsevier/Gold Standard (2008-01-13 16:46:09)

## 2018-10-21 ENCOUNTER — Telehealth: Payer: Self-pay | Admitting: Internal Medicine

## 2018-10-21 LAB — CUP PACEART INCLINIC DEVICE CHECK
Battery Remaining Longevity: 128 mo
Battery Voltage: 3.06 V
Brady Statistic AP VP Percent: 48.54 %
Brady Statistic AP VS Percent: 0 %
Brady Statistic AS VP Percent: 51.27 %
Brady Statistic AS VS Percent: 0.18 %
Brady Statistic RA Percent Paced: 48.54 %
Brady Statistic RV Percent Paced: 99.81 %
Date Time Interrogation Session: 20200128161033
Implantable Lead Implant Date: 20191024
Implantable Lead Implant Date: 20191024
Implantable Lead Location: 753859
Implantable Lead Location: 753859
Implantable Lead Model: 3830
Implantable Lead Model: 5076
Implantable Pulse Generator Implant Date: 20191024
Lead Channel Impedance Value: 342 Ohm
Lead Channel Impedance Value: 380 Ohm
Lead Channel Impedance Value: 513 Ohm
Lead Channel Impedance Value: 551 Ohm
Lead Channel Pacing Threshold Amplitude: 0.625 V
Lead Channel Pacing Threshold Amplitude: 0.75 V
Lead Channel Pacing Threshold Pulse Width: 0.4 ms
Lead Channel Pacing Threshold Pulse Width: 0.4 ms
Lead Channel Sensing Intrinsic Amplitude: 10.625 mV
Lead Channel Sensing Intrinsic Amplitude: 10.625 mV
Lead Channel Sensing Intrinsic Amplitude: 2.625 mV
Lead Channel Sensing Intrinsic Amplitude: 2.75 mV
Lead Channel Setting Pacing Amplitude: 2.5 V
Lead Channel Setting Pacing Amplitude: 2.75 V
Lead Channel Setting Pacing Pulse Width: 0.4 ms
Lead Channel Setting Sensing Sensitivity: 1.2 mV

## 2018-10-21 NOTE — Telephone Encounter (Signed)
New Message        Patient is calling today to see if it's ok for her to drive? Pls call and advise

## 2018-10-22 NOTE — Telephone Encounter (Signed)
Dr. Lovena Le is reviewing this matter.  If Pt calls again make her aware he is reviewing and will call when he has made a decision.

## 2018-10-22 NOTE — Telephone Encounter (Signed)
Patient is calling again today. Waiting on call back.

## 2018-10-22 NOTE — Telephone Encounter (Signed)
Follow Up:    Patient retuning concerning  Patient would like to know if it is ok to Drive. Please call patient. Patient states that she has call 2 times and no one has call her back.

## 2018-10-22 NOTE — Telephone Encounter (Signed)
Left detailed message per DPR.  Advised per Dr. Leonel Ramsay a cardiology standpoint patient is ok to drive.

## 2018-12-08 ENCOUNTER — Other Ambulatory Visit: Payer: Self-pay | Admitting: Podiatry

## 2019-01-13 ENCOUNTER — Telehealth: Payer: Self-pay | Admitting: Internal Medicine

## 2019-01-13 NOTE — Telephone Encounter (Signed)
New message    Called pt about setting up virtual visit with Dr. Lovena Le on 04.30.20. she said she would have to call back after she spoke to her son.

## 2019-01-14 NOTE — Telephone Encounter (Signed)
Follow up   Patient's son is returning your call. Please call.  

## 2019-01-14 NOTE — Telephone Encounter (Signed)
Follow up     Spoke with pt son per pt. He will be with pt on appt date. They will use his phone number listed in appt notes for video with Dr. Lovena Le.     Virtual Visit Pre-Appointment Phone Call  "(Name), I am calling you today to discuss your upcoming appointment. We are currently trying to limit exposure to the virus that causes COVID-19 by seeing patients at home rather than in the office."  1. "What is the BEST phone number to call the day of the visit?" - include this in appointment notes  2. Do you have or have access to (through a family member/friend) a smartphone with video capability that we can use for your visit?" a. If yes - list this number in appt notes as cell (if different from BEST phone #) and list the appointment type as a VIDEO visit in appointment notes b. If no - list the appointment type as a PHONE visit in appointment notes  3. Confirm consent - "In the setting of the current Covid19 crisis, you are scheduled for a (phone or video) visit with your provider on (date) at (time).  Just as we do with many in-office visits, in order for you to participate in this visit, we must obtain consent.  If you'd like, I can send this to your mychart (if signed up) or email for you to review.  Otherwise, I can obtain your verbal consent now.  All virtual visits are billed to your insurance company just like a normal visit would be.  By agreeing to a virtual visit, we'd like you to understand that the technology does not allow for your provider to perform an examination, and thus may limit your provider's ability to fully assess your condition. If your provider identifies any concerns that need to be evaluated in person, we will make arrangements to do so.  Finally, though the technology is pretty good, we cannot assure that it will always work on either your or our end, and in the setting of a video visit, we may have to convert it to a phone-only visit.  In either situation, we  cannot ensure that we have a secure connection.  Are you willing to proceed?" STAFF: Did the patient verbally acknowledge consent to telehealth visit? Document YES/NO here: YES  4. Advise patient to be prepared - "Two hours prior to your appointment, go ahead and check your blood pressure, pulse, oxygen saturation, and your weight (if you have the equipment to check those) and write them all down. When your visit starts, your provider will ask you for this information. If you have an Apple Watch or Kardia device, please plan to have heart rate information ready on the day of your appointment. Please have a pen and paper handy nearby the day of the visit as well."  5. Give patient instructions for MyChart download to smartphone OR Doximity/Doxy.me as below if video visit (depending on what platform provider is using)  6. Inform patient they will receive a phone call 15 minutes prior to their appointment time (may be from unknown caller ID) so they should be prepared to answer    TELEPHONE CALL NOTE  DONNICA JARNAGIN has been deemed a candidate for a follow-up tele-health visit to limit community exposure during the Covid-19 pandemic. I spoke with the patient via phone to ensure availability of phone/video source, confirm preferred email & phone number, and discuss instructions and expectations.  I reminded Sherrill Raring  to be prepared with any vital sign and/or heart rhythm information that could potentially be obtained via home monitoring, at the time of her visit. I reminded YEZENIA FREDRICK to expect a phone call prior to her visit.  Ashland P Edwards 01/14/2019 2:22 PM   INSTRUCTIONS FOR DOWNLOADING THE MYCHART APP TO SMARTPHONE  - The patient must first make sure to have activated MyChart and know their login information - If Apple, go to [employer] and type in MyChart in the search bar and download the app. If Android, ask patient to go to [employer] and type in [place] in the  search bar and download the app. The app is free but as with any other app downloads, their phone may require them to verify saved payment information or Apple/Android password.  - The patient will need to then log into the app with their MyChart username and password, and select [facility] as their healthcare provider to link the account. When it is time for your visit, go to the MyChart app, find appointments, and click Begin Video Visit. Be sure to Select Allow for your device to access the Microphone and Camera for your visit. You will then be connected, and your provider will be with you shortly.  **If they have any issues connecting, or need assistance please contact MyChart service desk [phone])**  **If using a computer, in order to ensure the best quality for their visit they will need to use either of the following Internet Browsers: [employer], or Google Chrome**  IF USING DOXIMITY or DOXY.ME - The patient will receive a link just prior to their visit by text.     FULL LENGTH CONSENT FOR TELE-HEALTH VISIT   I hereby voluntarily request, consent and authorize [facility] and its employed or contracted physicians, physician assistants, nurse practitioners or other licensed health care professionals (the Practitioner), to provide me with telemedicine health care services (the Services") as deemed necessary by the treating Practitioner. I acknowledge and consent to receive the Services by the Practitioner via telemedicine. I understand that the telemedicine visit will involve communicating with the Practitioner through live audiovisual communication technology and the disclosure of certain medical information by electronic transmission. I acknowledge that I have been given the opportunity to request an in-person assessment or other available alternative prior to the telemedicine visit and am voluntarily participating in the telemedicine visit.  I understand that I  have the right to withhold or withdraw my consent to the use of telemedicine in the course of my care at any time, without affecting my right to future care or treatment, and that the Practitioner or I may terminate the telemedicine visit at any time. I understand that I have the right to inspect all information obtained and/or recorded in the course of the telemedicine visit and may receive copies of available information for a reasonable fee.  I understand that some of the potential risks of receiving the Services via telemedicine include:   Delay or interruption in medical evaluation due to technological equipment failure or disruption;  Information transmitted may not be sufficient (e.g. poor resolution of images) to allow for appropriate medical decision making by the Practitioner; and/or   In rare instances, security protocols could fail, causing a breach of personal health information.  Furthermore, I acknowledge that it is my responsibility to provide information about my medical history, conditions and care that is complete and accurate to the best of my ability. I acknowledge that  Practitioner's advice, recommendations, and/or decision may be based on factors not within their control, such as incomplete or inaccurate data provided by me or distortions of diagnostic images or specimens that may result from electronic transmissions. I understand that the practice of medicine is not an exact science and that Practitioner makes no warranties or guarantees regarding treatment outcomes. I acknowledge that I will receive a copy of this consent concurrently upon execution via email to the email address I last provided but may also request a printed copy by calling the office of [facility].    I understand that my insurance will be billed for this visit.   I have read or had this consent read to me.  I understand the contents of this consent, which adequately explains the benefits and risks of the  Services being provided via telemedicine.   I have been provided ample opportunity to ask questions regarding this consent and the Services and have had my questions answered to my satisfaction.  I give my informed consent for the services to be provided through the use of telemedicine in my medical care  By participating in this telemedicine visit I agree to the above.

## 2019-01-19 ENCOUNTER — Ambulatory Visit (INDEPENDENT_AMBULATORY_CARE_PROVIDER_SITE_OTHER): Payer: Medicare Other | Admitting: *Deleted

## 2019-01-19 ENCOUNTER — Other Ambulatory Visit: Payer: Self-pay

## 2019-01-19 DIAGNOSIS — I442 Atrioventricular block, complete: Secondary | ICD-10-CM | POA: Diagnosis not present

## 2019-01-20 LAB — CUP PACEART REMOTE DEVICE CHECK
Battery Remaining Longevity: 129 mo
Battery Voltage: 3.05 V
Brady Statistic AP VP Percent: 34.97 %
Brady Statistic AP VS Percent: 0 %
Brady Statistic AS VP Percent: 64.97 %
Brady Statistic AS VS Percent: 0.05 %
Brady Statistic RA Percent Paced: 34.96 %
Brady Statistic RV Percent Paced: 99.95 %
Date Time Interrogation Session: 20200429022918
Implantable Lead Implant Date: 20191024
Implantable Lead Implant Date: 20191024
Implantable Lead Location: 753859
Implantable Lead Location: 753859
Implantable Lead Model: 3830
Implantable Lead Model: 5076
Implantable Pulse Generator Implant Date: 20191024
Lead Channel Impedance Value: 285 Ohm
Lead Channel Impedance Value: 342 Ohm
Lead Channel Impedance Value: 418 Ohm
Lead Channel Impedance Value: 475 Ohm
Lead Channel Pacing Threshold Amplitude: 0.625 V
Lead Channel Pacing Threshold Amplitude: 1.25 V
Lead Channel Pacing Threshold Pulse Width: 0.4 ms
Lead Channel Pacing Threshold Pulse Width: 0.4 ms
Lead Channel Sensing Intrinsic Amplitude: 10.625 mV
Lead Channel Sensing Intrinsic Amplitude: 10.625 mV
Lead Channel Sensing Intrinsic Amplitude: 2.25 mV
Lead Channel Sensing Intrinsic Amplitude: 2.25 mV
Lead Channel Setting Pacing Amplitude: 2 V
Lead Channel Setting Pacing Amplitude: 2.5 V
Lead Channel Setting Pacing Pulse Width: 0.4 ms
Lead Channel Setting Sensing Sensitivity: 1.2 mV

## 2019-01-21 ENCOUNTER — Other Ambulatory Visit: Payer: Self-pay

## 2019-01-21 ENCOUNTER — Telehealth (INDEPENDENT_AMBULATORY_CARE_PROVIDER_SITE_OTHER): Payer: Medicare Other | Admitting: Internal Medicine

## 2019-01-21 DIAGNOSIS — I442 Atrioventricular block, complete: Secondary | ICD-10-CM

## 2019-01-21 DIAGNOSIS — R000 Tachycardia, unspecified: Secondary | ICD-10-CM

## 2019-01-21 DIAGNOSIS — Z950 Presence of cardiac pacemaker: Secondary | ICD-10-CM

## 2019-01-21 NOTE — Progress Notes (Signed)
Electrophysiology TeleHealth Note   Due to national recommendations of social distancing due to COVID 19, an audio/video telehealth visit is felt to be most appropriate for this patient at this time.  See MyChart message from today for the patient's consent to telehealth for [facility].   Date:  01/21/2019   ID:  Yoshiko, Keleher 1946/06/11, MRN [id number]  Location: patient's home  Provider location: [address] [place]  Evaluation Performed: Follow-up visit  PCP:  Darcus Austin, MD (Inactive)  Cardiologist:  No primary care provider on file.  Electrophysiologist:  Dr Lovena Le  Chief Complaint:  "I'm doing alright"  History of Present Illness:    YENIFER SACCENTE is a 73 y.o. female who presents via audio/video conferencing for a telehealth visit today.  He is a pleasant 73 yo woman with a h/o CHB, s/p PPM insertion, PAF, on metoprolol and low dose flecainide. She has occaisional palpitations. She c/o visual hallucinations when I saw her last. These have resolved. She also has had auditory hallucinations while she is sleeping. When she awakens she does not hear anything. No chest pain or sob. No syncope. Today, she denies symptoms of palpitations, chest pain, shortness of breath,  lower extremity edema, dizziness, presyncope, or syncope.  The patient is otherwise without complaint today.  The patient denies symptoms of fevers, chills, cough, or new SOB worrisome for COVID 19.  Past Medical History:  Diagnosis Date  . Bradycardia 06/2018  . Breast cancer ([place])   . Cancer [facility]) left    breast cancer 2005 surgery, chemo and radiation   . Diabetes mellitus   . Headache   . Hypercholesteremia   . Macular degeneration   . Osteoarthritis   . 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 Vitamin D deficiency   . Vulvar cancer ([place]) 2017 new dx    Past Surgical History:  Procedure Laterality Date  . ABDOMINAL HYSTERECTOMY     complete  . BREAST SURGERY Left 2006   left breast lumpectomy , chemo and radiation done  . COLONOSCOPY WITH PROPOFOL N/A 07/24/2015   Procedure: COLONOSCOPY WITH PROPOFOL;  Surgeon: Garlan Fair, MD;  Location: [facility];  Service: Endoscopy;  Laterality: N/A;  . EYE SURGERY     removal of growth 35 years ago  . HEMORROIDECTOMY     38 years ago, no present symtoms  . KNEE SURGERY Bilateral   . PACEMAKER IMPLANT N/A 07/16/2018   Procedure: PACEMAKER IMPLANT;  Surgeon: Evans Lance, MD;  Location: [facility];  Service: Cardiovascular;  Laterality: N/A;  . RADICAL VULVECTOMY N/A 02/01/2016   Procedure: RADICAL VULVECTOMY;  Surgeon: [name], MD;  Location: [facility];  Service: Gynecology;  Laterality: N/A;    Current Outpatient Medications  Medication Sig Dispense Refill  . Cyanocobalamin (VITAMIN B 12 PO) Take 1,000 mcg by mouth daily.    Marland Kitchen donepezil (ARICEPT) 10 MG tablet Take 10 mg by mouth at bedtime.    . ergocalciferol (VITAMIN D2) 50000 UNITS capsule Take 50,000 Units by mouth every Tuesday.     . flecainide (TAMBOCOR) 50 MG tablet Take 1 tablet (50 mg total) by mouth 2 (two) times daily. 180 tablet 3  . metoprolol tartrate (LOPRESSOR) 25 MG tablet Take 1 tablet (25 mg total) by mouth 2 (two) times daily. 180 tablet 0  . Omega-3 Fatty Acids (FISH OIL) 1200 MG CAPS Take 2 capsules by mouth  every morning.    . sertraline (ZOLOFT) 25 MG tablet Take 1 tablet (25 mg total) by mouth daily. 90 tablet 2   No current facility-administered medications for this visit.     Allergies:   Duloxetine   Social History:  The patient  reports that she has never smoked. She has never used smokeless tobacco. She reports that she does not drink alcohol or use drugs.   Family History:  The patient's  family history includes Cancer in her maternal grandmother; Dementia in her mother; Other in her father.   ROS:  Please see the history of present illness.   All other systems are  personally reviewed and negative.    Exam:    Vital Signs:  There were no vitals taken for this visit.  Well appearing, alert and conversant, regular work of breathing,  good skin color Eyes- anicteric, neuro- grossly intact, skin- no apparent rash or lesions or cyanosis, mouth- oral mucosa is pink   Labs/Other Tests and Data Reviewed:    Recent Labs: 07/15/2018: Hemoglobin 13.6; Platelets 237 09/01/2018: BUN 13; Creatinine, Ser 0.76; Potassium 3.8; Sodium 139   Wt Readings from Last 3 Encounters:  10/20/18 146 lb 3.2 oz (66.3 kg)  09/12/18 149 lb 9.6 oz (67.9 kg)  09/01/18 149 lb (67.6 kg)     Other studies personally reviewed:  Last device remote is reviewed from [facility] PDF dated 4/20 which reveals normal device function, and very brief episodes of atrial tachycardia, longest 4 minutes.   ASSESSMENT & PLAN:    1.  Atrial tachycardia - she is symptomatic but minimally so. Her episodes are of brief duration. I have recommended she continue her flecainide. We could increase the dose but for now we will not. 2. Hallucinations - the visual hallucinations have resolved. I strongly suspect that the auditory hallucinations are intense dreams which can be due to flecainide. 3. Dementia - she is currently on aricept. 4. COVID 19 screen The patient denies symptoms of COVID 19 at this time.  The importance of social distancing was discussed today.  Follow-up:  3 months Next remote: 3 months  Current medicines are reviewed at length with the patient today.   The patient does not have concerns regarding her medicines.  The following changes were made today:  none  Labs/ tests ordered today include: none No orders of the defined types were placed in this encounter.    Patient Risk:  after full review of this patients clinical status, I feel that they are at moderate risk at this time.  Today, I have spent 25 minutes with the patient with telehealth technology discussing the  above.    Signed, [name], MD  01/21/2019 4:27 PM     [facility] [address] [place] [address] [phone] (office) [phone] (fax)

## 2019-01-27 NOTE — Progress Notes (Signed)
Remote pacemaker transmission.   

## 2019-03-02 ENCOUNTER — Ambulatory Visit: Payer: Medicare Other | Admitting: Neurology

## 2019-03-29 ENCOUNTER — Other Ambulatory Visit: Payer: Self-pay | Admitting: *Deleted

## 2019-03-29 MED ORDER — METOPROLOL TARTRATE 25 MG PO TABS: 25.0000 mg | ORAL_TABLET | Freq: Two times a day (BID) | ORAL | 2 refills | Status: AC

## 2019-04-20 ENCOUNTER — Ambulatory Visit (INDEPENDENT_AMBULATORY_CARE_PROVIDER_SITE_OTHER): Payer: Medicare Other | Admitting: *Deleted

## 2019-04-20 DIAGNOSIS — I442 Atrioventricular block, complete: Secondary | ICD-10-CM

## 2019-04-20 LAB — CUP PACEART REMOTE DEVICE CHECK
Battery Remaining Longevity: 118 mo
Battery Voltage: 3.02 V
Brady Statistic AP VP Percent: 42.92 %
Brady Statistic AP VS Percent: 0.08 %
Brady Statistic AS VP Percent: 55.54 %
Brady Statistic AS VS Percent: 1.46 %
Brady Statistic RA Percent Paced: 43.3 %
Brady Statistic RV Percent Paced: 98.46 %
Date Time Interrogation Session: 20200728114031
Implantable Lead Implant Date: 20191024
Implantable Lead Implant Date: 20191024
Implantable Lead Location: 753859
Implantable Lead Location: 753859
Implantable Lead Model: 3830
Implantable Lead Model: 5076
Implantable Pulse Generator Implant Date: 20191024
Lead Channel Impedance Value: 304 Ohm
Lead Channel Impedance Value: 342 Ohm
Lead Channel Impedance Value: 437 Ohm
Lead Channel Impedance Value: 475 Ohm
Lead Channel Pacing Threshold Amplitude: 0.625 V
Lead Channel Pacing Threshold Amplitude: 1.375 V
Lead Channel Pacing Threshold Pulse Width: 0.4 ms
Lead Channel Pacing Threshold Pulse Width: 0.4 ms
Lead Channel Sensing Intrinsic Amplitude: 10.625 mV
Lead Channel Sensing Intrinsic Amplitude: 10.625 mV
Lead Channel Sensing Intrinsic Amplitude: 2.25 mV
Lead Channel Sensing Intrinsic Amplitude: 2.25 mV
Lead Channel Setting Pacing Amplitude: 2 V
Lead Channel Setting Pacing Amplitude: 2.75 V
Lead Channel Setting Pacing Pulse Width: 0.4 ms
Lead Channel Setting Sensing Sensitivity: 1.2 mV

## 2019-05-03 ENCOUNTER — Encounter: Payer: Self-pay | Admitting: Cardiology

## 2019-05-03 NOTE — Progress Notes (Signed)
Remote pacemaker transmission.   

## 2019-05-14 ENCOUNTER — Encounter: Payer: Self-pay | Admitting: Internal Medicine

## 2019-05-14 ENCOUNTER — Other Ambulatory Visit: Payer: Self-pay

## 2019-05-14 ENCOUNTER — Ambulatory Visit (INDEPENDENT_AMBULATORY_CARE_PROVIDER_SITE_OTHER): Payer: Medicare Other | Admitting: Internal Medicine

## 2019-05-14 VITALS — BP 134/80 | HR 79 | Ht 60.0 in | Wt 163.0 lb

## 2019-05-14 DIAGNOSIS — I442 Atrioventricular block, complete: Secondary | ICD-10-CM | POA: Diagnosis not present

## 2019-05-14 DIAGNOSIS — Z950 Presence of cardiac pacemaker: Secondary | ICD-10-CM | POA: Diagnosis not present

## 2019-05-14 LAB — CUP PACEART INCLINIC DEVICE CHECK
Battery Remaining Longevity: 114 mo
Battery Voltage: 3.01 V
Brady Statistic AP VP Percent: 39.33 %
Brady Statistic AP VS Percent: 0.04 %
Brady Statistic AS VP Percent: 59.88 %
Brady Statistic AS VS Percent: 0.75 %
Brady Statistic RA Percent Paced: 39.51 %
Brady Statistic RV Percent Paced: 99.21 %
Date Time Interrogation Session: 20200821163758
Implantable Lead Implant Date: 20191024
Implantable Lead Implant Date: 20191024
Implantable Lead Location: 753859
Implantable Lead Location: 753860
Implantable Lead Model: 3830
Implantable Lead Model: 5076
Implantable Pulse Generator Implant Date: 20191024
Lead Channel Impedance Value: 323 Ohm
Lead Channel Impedance Value: 361 Ohm
Lead Channel Impedance Value: 475 Ohm
Lead Channel Impedance Value: 494 Ohm
Lead Channel Pacing Threshold Amplitude: 0.625 V
Lead Channel Pacing Threshold Amplitude: 1.5 V
Lead Channel Pacing Threshold Pulse Width: 0.4 ms
Lead Channel Pacing Threshold Pulse Width: 0.4 ms
Lead Channel Sensing Intrinsic Amplitude: 10.625 mV
Lead Channel Sensing Intrinsic Amplitude: 10.625 mV
Lead Channel Sensing Intrinsic Amplitude: 2.5 mV
Lead Channel Sensing Intrinsic Amplitude: 3 mV
Lead Channel Setting Pacing Amplitude: 2 V
Lead Channel Setting Pacing Amplitude: 2.5 V
Lead Channel Setting Pacing Pulse Width: 0.6 ms
Lead Channel Setting Sensing Sensitivity: 1.2 mV

## 2019-05-14 NOTE — Progress Notes (Signed)
HPI [name] is a 73 y.o. female who presents via audio/video conferencing for a telehealth visit today.  He is a pleasant 73 yo woman with a h/o CHB, s/p PPM insertion, PAF, on metoprolol and low dose flecainide.  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 No chest pain or sob. No syncope. No edema. Allergies  Allergen Reactions  . Duloxetine Itching    Cymbalta     Current Outpatient Medications  Medication Sig Dispense Refill  . Cyanocobalamin (VITAMIN B 12 PO) Take 1,000 mcg by mouth daily.    Marland Kitchen donepezil (ARICEPT) 10 MG tablet Take 10 mg by mouth at bedtime.    . ergocalciferol (VITAMIN D2) 50000 UNITS capsule Take 50,000 Units by mouth every Tuesday.     . flecainide (TAMBOCOR) 50 MG tablet Take 1 tablet (50 mg total) by mouth 2 (two) times daily. 180 tablet 3  . metoprolol tartrate (LOPRESSOR) 25 MG tablet Take 1 tablet (25 mg total) by mouth 2 (two) times daily. 180 tablet 2  . Omega-3 Fatty Acids (FISH OIL) 1200 MG CAPS Take 2 capsules by mouth every morning.    . sertraline (ZOLOFT) 25 MG tablet Take 1 tablet (25 mg total) by mouth daily. 90 tablet 2   No current facility-administered medications for this visit.      Past Medical History:  Diagnosis Date  . Bradycardia 06/2018  . Breast cancer ([place])   . Cancer [facility]) left    breast cancer 2005 surgery, chemo and radiation   . Diabetes mellitus   . Headache   . Hypercholesteremia   . Macular degeneration   . Osteoarthritis   . 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 Positive PPD    h/o, age 25  . Vitamin D deficiency   . Vulvar cancer ([place]) 2017 new dx    ROS:   All systems reviewed and negative except as noted in the HPI.   Past Surgical History:  Procedure Laterality Date  . ABDOMINAL HYSTERECTOMY     complete  . BREAST SURGERY Left 2006   left breast lumpectomy , chemo and radiation done  . COLONOSCOPY WITH PROPOFOL N/A 07/24/2015   Procedure: COLONOSCOPY  WITH PROPOFOL;  Surgeon: Garlan Fair, MD;  [facility];  Service: Endoscopy;  Laterality: N/A;  . EYE SURGERY     removal of growth 35 years ago  . HEMORROIDECTOMY     38 years ago, no present symtoms  . KNEE SURGERY Bilateral   . PACEMAKER IMPLANT N/A 07/16/2018   Procedure: PACEMAKER IMPLANT;  Surgeon: Evans Lance, MD;  Location: [facility];  Service: Cardiovascular;  Laterality: N/A;  . RADICAL VULVECTOMY N/A 02/01/2016   Procedure: RADICAL VULVECTOMY;  Surgeon: [name], MD;  [facility];  Service: Gynecology;  Laterality: N/A;     Family History  Problem Relation Age of Onset  . Cancer Maternal Grandmother   . Dementia Mother   . Other Father        possible Dementia     Social History   Socioeconomic History  . Marital status: Married    Spouse name: Not on file  . Number of children: 2  . Years of education: Not on file  . Highest education level: Master's degree (e.g., MA, MS, MEng, MEd, MSW, MBA)  Occupational History  . Not on file  Social Needs  . Financial resource strain: Not on file  . Food insecurity  Worry: Not on file    Inability: Not on file  . Transportation needs    Medical: Not on file    Non-medical: Not on file  Tobacco Use  . Smoking status: Never Smoker  . Smokeless tobacco: Never Used  Substance and Sexual Activity  . Alcohol use: Never    Frequency: Never  . Drug use: Never  . Sexual activity: Yes  Lifestyle  . Physical activity    Days per week: Not on file    Minutes per session: Not on file  . Stress: Not on file  Relationships  . Social Herbalist on phone: Not on file    Gets together: Not on file    Attends religious service: Not on file    Active member of club or organization: Not on file    Attends meetings of clubs or organizations: Not on file    Relationship status: Not on file  . Intimate partner violence    Fear of current or ex partner: Not on file    Emotionally  abused: Not on file    Physically abused: Not on file    Forced sexual activity: Not on file  Other Topics Concern  . Not on file  Social History Narrative   Lives at home with husband   Right handed   Caffeine: never     BP 134/80   Pulse 79   Ht 5' (1.524 m)   Wt 163 lb (73.9 kg)   SpO2 98%   BMI 31.83 kg/m   Physical Exam:  Well appearing NAD HEENT: Unremarkable Neck:  No JVD, no thyromegally Lymphatics:  No adenopathy Back:  No CVA tenderness Lungs:  Clear with no wheezes HEART:  Regular rate rhythm, no murmurs, no rubs, no clicks Abd:  soft, positive bowel sounds, no organomegally, no rebound, no guarding Ext:  2 plus pulses, no edema, no cyanosis, no clubbing Skin:  No rashes no nodules Neuro:  CN II through XII intact, motor grossly intact  EKG - NSR with P synchronous ventricular pacing  DEVICE  Normal device function.  See PaceArt for details.   Assess/Plan: 1. CHB - she is asymptomatic, s/p PPM insertion. 2. Hallucinations - these appear to be resolved or at least she is not complaining. 3. PPM - her [employer] DDD PM is working normally with her 3830 lead capturing the LB and septal myocardium.  [name]

## 2019-05-14 NOTE — Patient Instructions (Signed)
Your physician recommends that you continue on your current medications as directed. Please refer to the Current Medication list given to you today.   Your physician wants you to follow-up in: YEAR WITH DR TAYLOR  You will receive a reminder letter in the mail two months in advance. If you don't receive a letter, please call our office to schedule the follow-up appointment.  

## 2019-07-20 ENCOUNTER — Ambulatory Visit (INDEPENDENT_AMBULATORY_CARE_PROVIDER_SITE_OTHER): Payer: Medicare Other | Admitting: *Deleted

## 2019-07-20 DIAGNOSIS — R001 Bradycardia, unspecified: Secondary | ICD-10-CM | POA: Diagnosis not present

## 2019-07-21 LAB — CUP PACEART REMOTE DEVICE CHECK
Battery Remaining Longevity: 92 mo
Battery Voltage: 3 V
Brady Statistic AP VP Percent: 34.06 %
Brady Statistic AP VS Percent: 0.06 %
Brady Statistic AS VP Percent: 63.56 %
Brady Statistic AS VS Percent: 2.32 %
Brady Statistic RA Percent Paced: 34.49 %
Brady Statistic RV Percent Paced: 97.62 %
Date Time Interrogation Session: 20201027013856
Implantable Lead Implant Date: 20191024
Implantable Lead Implant Date: 20191024
Implantable Lead Location: 753859
Implantable Lead Location: 753860
Implantable Lead Model: 3830
Implantable Lead Model: 5076
Implantable Pulse Generator Implant Date: 20191024
Lead Channel Impedance Value: 323 Ohm
Lead Channel Impedance Value: 323 Ohm
Lead Channel Impedance Value: 456 Ohm
Lead Channel Impedance Value: 475 Ohm
Lead Channel Pacing Threshold Amplitude: 0.625 V
Lead Channel Pacing Threshold Amplitude: 1.875 V
Lead Channel Pacing Threshold Pulse Width: 0.4 ms
Lead Channel Pacing Threshold Pulse Width: 0.4 ms
Lead Channel Sensing Intrinsic Amplitude: 16.75 mV
Lead Channel Sensing Intrinsic Amplitude: 16.75 mV
Lead Channel Sensing Intrinsic Amplitude: 2.25 mV
Lead Channel Sensing Intrinsic Amplitude: 2.25 mV
Lead Channel Setting Pacing Amplitude: 2 V
Lead Channel Setting Pacing Amplitude: 3.75 V
Lead Channel Setting Pacing Pulse Width: 0.4 ms
Lead Channel Setting Sensing Sensitivity: 1.2 mV

## 2019-08-10 NOTE — Progress Notes (Signed)
Remote pacemaker transmission.   

## 2019-09-13 IMAGING — DX DG CHEST 2V
2 series · 2 of 2 positions shown · non-contrast
Comparison: PET-CT 01/22/2016, 11/26/2010, plain film 07/17/2010

CLINICAL DATA: 72-year-old female with a history of pacemaker
placement

EXAM:
CHEST - 2 VIEW

[chest lat]
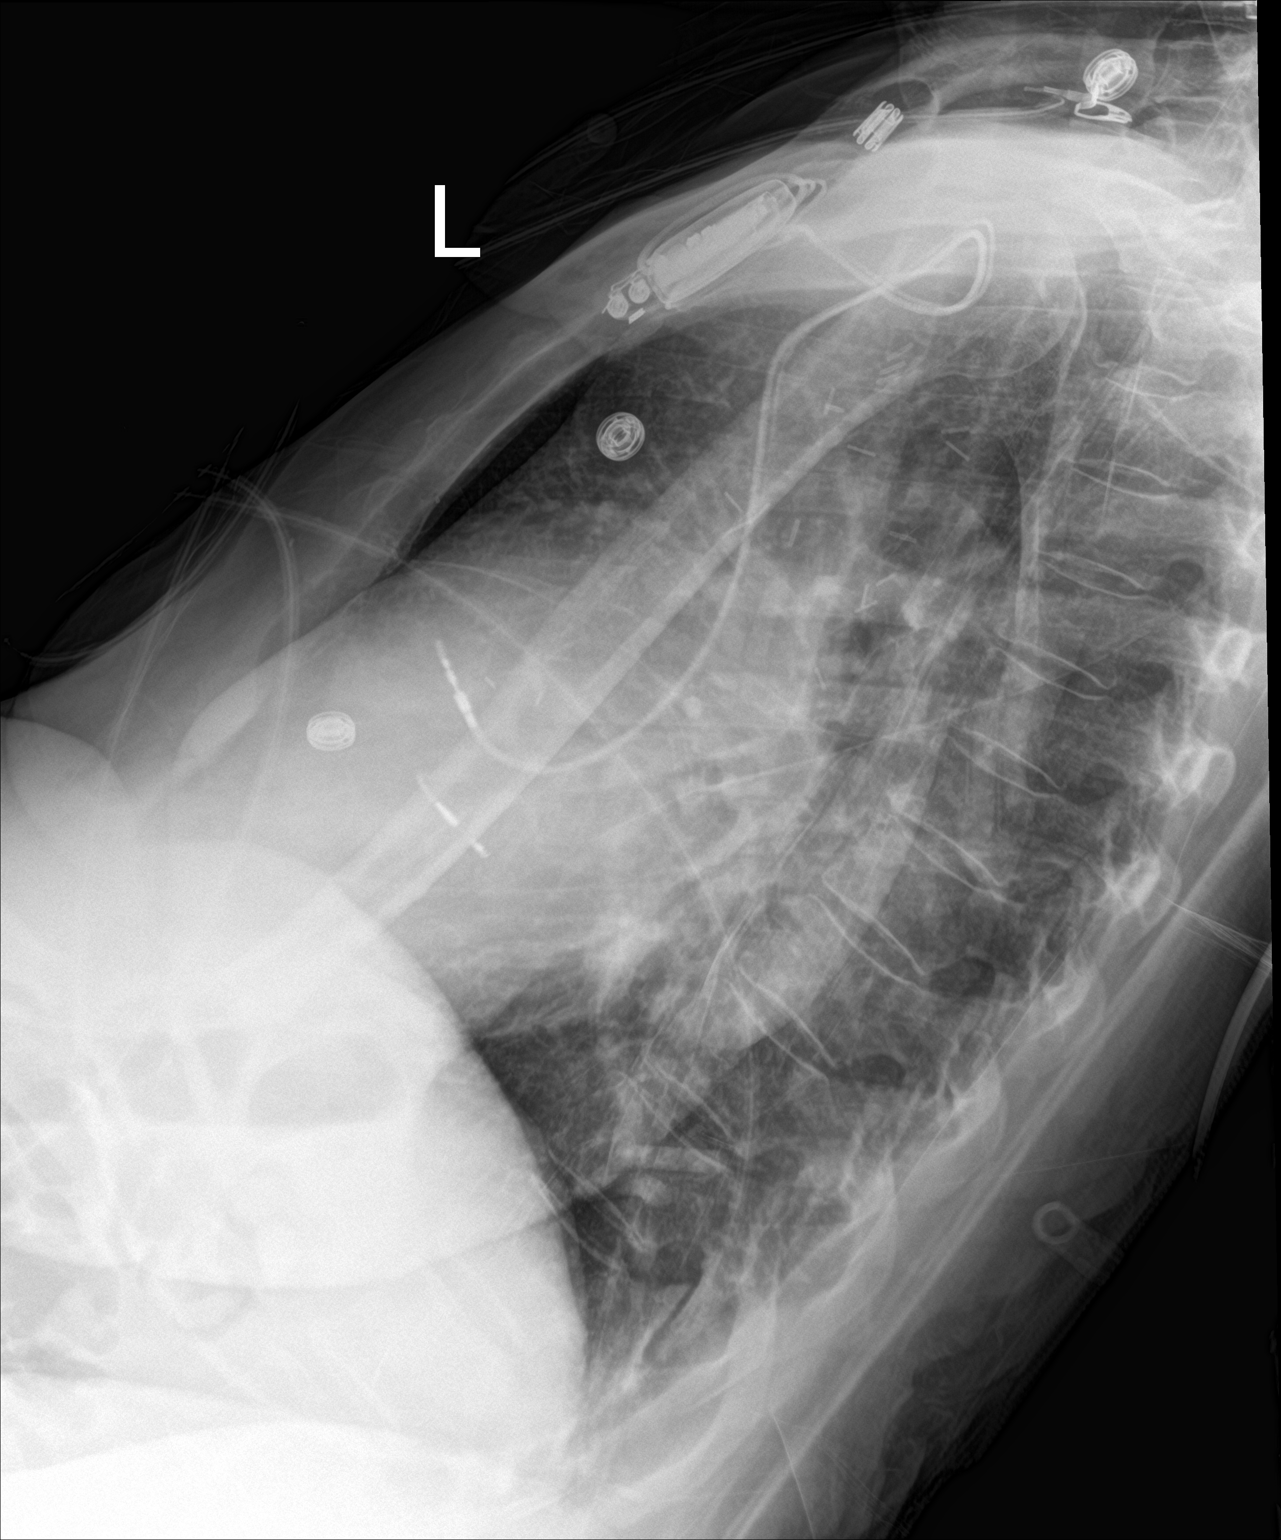

[chest ap]
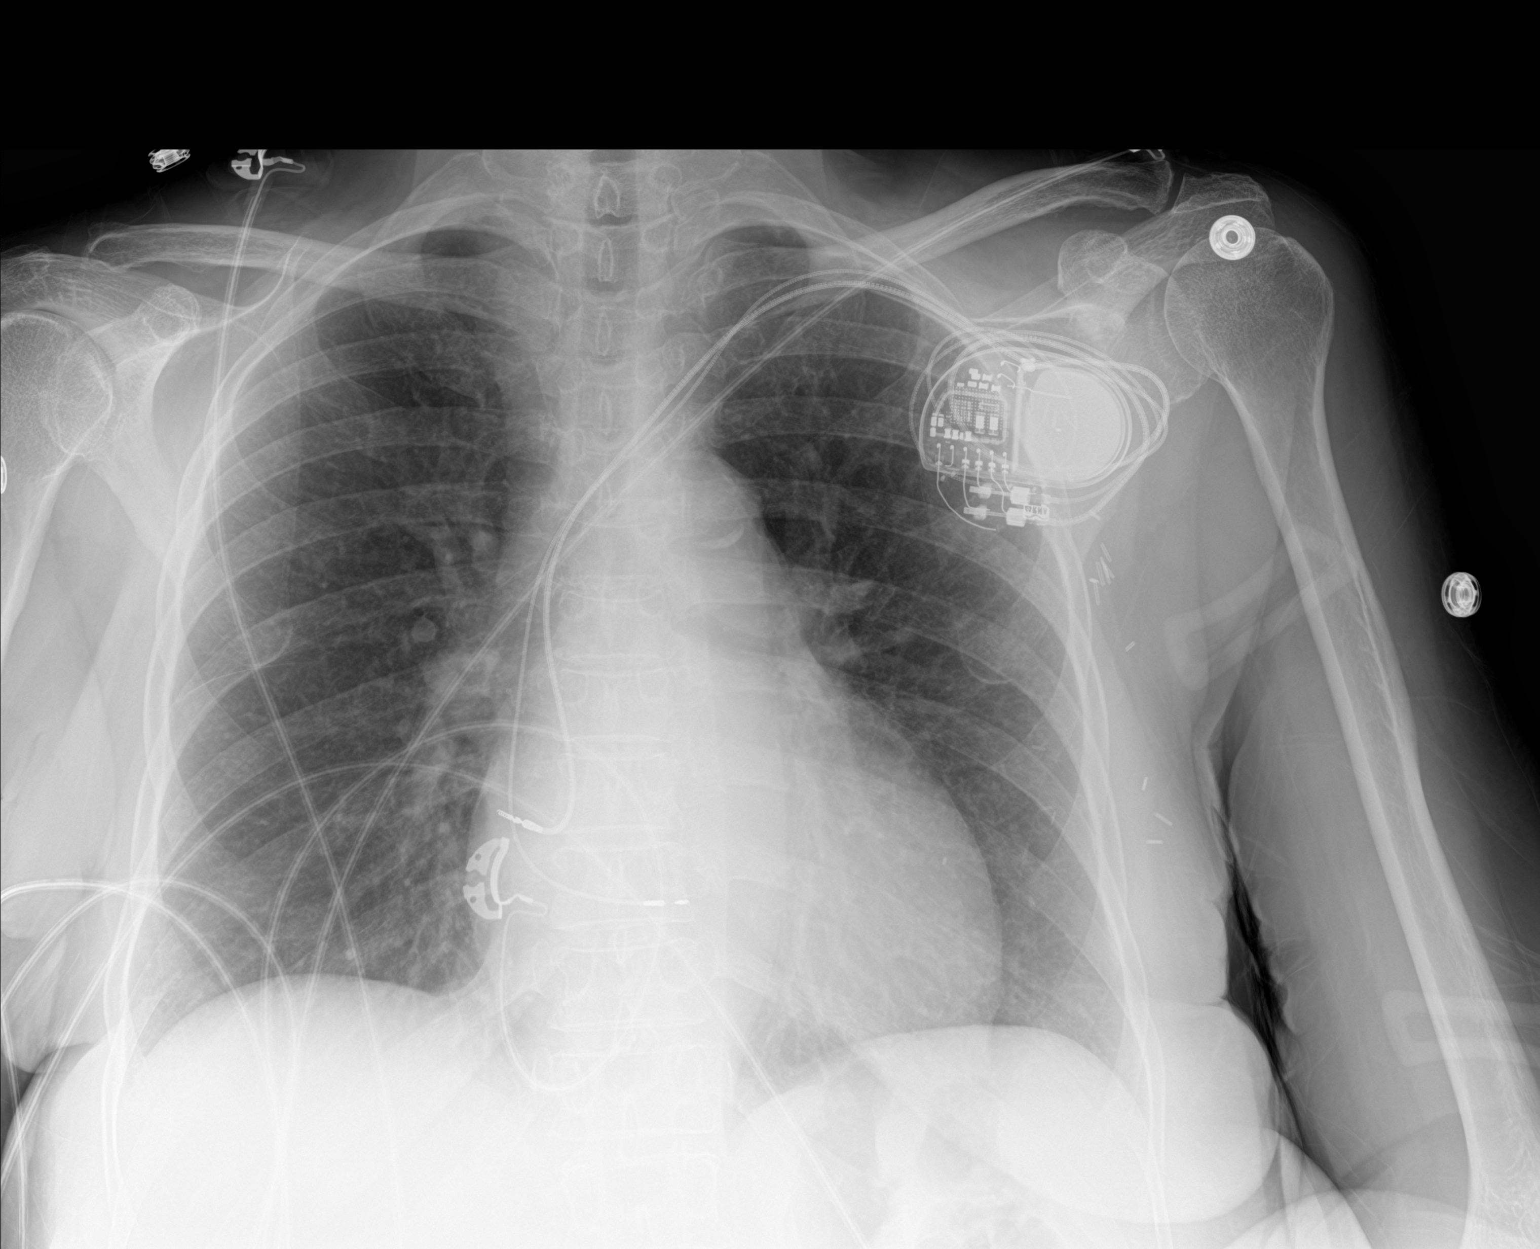

[2 of 2 positions shown; findings below may reference images not displayed]

FINDINGS: Cardiomediastinal silhouette unchanged in size and contour.

Calcifications of the aortic arch.

Interval placement of left chest wall pacing device with 2 leads in
place via subclavian approach. Surgical clips on the left chest. No
pneumothorax.

No confluent airspace disease or large pleural fluid. No displaced
fracture
IMPRESSION: Interval placement of left cardiac pacing device with 2 leads and no
complicating features.

Chronic lung changes.

## 2019-10-19 ENCOUNTER — Ambulatory Visit (INDEPENDENT_AMBULATORY_CARE_PROVIDER_SITE_OTHER): Payer: Medicare PPO | Admitting: *Deleted

## 2019-10-19 DIAGNOSIS — R001 Bradycardia, unspecified: Secondary | ICD-10-CM

## 2019-10-19 LAB — CUP PACEART REMOTE DEVICE CHECK
Battery Remaining Longevity: 89 mo
Battery Voltage: 2.99 V
Brady Statistic AP VP Percent: 36.13 %
Brady Statistic AP VS Percent: 0.04 %
Brady Statistic AS VP Percent: 62.17 %
Brady Statistic AS VS Percent: 1.66 %
Brady Statistic RA Percent Paced: 36.37 %
Brady Statistic RV Percent Paced: 98.3 %
Date Time Interrogation Session: 20210125223054
Implantable Lead Implant Date: 20191024
Implantable Lead Implant Date: 20191024
Implantable Lead Location: 753859
Implantable Lead Location: 753860
Implantable Lead Model: 3830
Implantable Lead Model: 5076
Implantable Pulse Generator Implant Date: 20191024
Lead Channel Impedance Value: 304 Ohm
Lead Channel Impedance Value: 323 Ohm
Lead Channel Impedance Value: 437 Ohm
Lead Channel Impedance Value: 456 Ohm
Lead Channel Pacing Threshold Amplitude: 0.75 V
Lead Channel Pacing Threshold Amplitude: 1.875 V
Lead Channel Pacing Threshold Pulse Width: 0.4 ms
Lead Channel Pacing Threshold Pulse Width: 0.4 ms
Lead Channel Sensing Intrinsic Amplitude: 2.25 mV
Lead Channel Sensing Intrinsic Amplitude: 2.25 mV
Lead Channel Sensing Intrinsic Amplitude: 7.625 mV
Lead Channel Sensing Intrinsic Amplitude: 7.625 mV
Lead Channel Setting Pacing Amplitude: 2 V
Lead Channel Setting Pacing Amplitude: 3.75 V
Lead Channel Setting Pacing Pulse Width: 0.4 ms
Lead Channel Setting Sensing Sensitivity: 1.2 mV

## 2019-11-29 NOTE — Progress Notes (Signed)
[facility]    Provider:  Dr Jaynee Eagles Referring Provider: Glenis Smoker, * Primary Care Physician:  Glenis Smoker, MD  CC:  Memory loss  Interval history November 29, 2019: [name] is a 74 year old female here as requested by Dr. Lindell Noe for dementia.  Past medical history hypertension, diabetes 2, macular degeneration.  She was evaluated in December 2019 for similar, at that time we sent her to Dr. Sima Matas for neuropsychological testing, she did not complete this.  MRI of the brain with mod-severe white matter changes which may raise suspicion of a vascular component.  We started her on Aricept.  They canceled their follow-up appointment and never came back. Labs unremarkable: homocysteine, bmp, b1, mma, b12(elevated 1796, asked her to decrease the amount of b12 she was taking).Today she reports her memory is worse. We will reorder formal neuropsych testing and aricept. She never did see anyone about her depression as recommended.   She feels her memory is worsening to the point she doesn't remember. She loves to cook but she doesn't remember ingredients. She has to ask her family little things she knows. She forgets where things are and that bother her. She doesn't like to drive. Her son is here and provides a lot of information. Son helps with doctor appointments. Her son says she struggles to remember things, some days are better than others. She will say I have "no clue where we are at". She doesn;t recognize buildings. Son helps with pill box.   HPI 09/01/2018:  [name] is a 74 y.o. female here as requested by Dr. Lindell Noe for dementia. PMHx HTN, edema, DM2, vulvar cancer, history of breast cancer, macular degeneration, osteoarthritis. Patent here with son who provides most information. Short term memory loss unknown length Son is a Biomedical scientist and mother helps, she started asking about directions, about 3 months ago. May have started about a year  ago, just really noticed 3 months ago. She tells the same story over and over asks the same questions. She doesn't lose things in the home. She may put something where it doesn't belong but she finds it. She cooks and cleans. No accidents in the house. She lives with her husband who has also noticed. More short-term memory. Also lack of interest. She didn't pay bills for 3 months. Husband pays the jpint bills but her personal bills she didn't pay. Daughter and son now helping. She is not driving, she just stopped. Dr. Inda Merlin told her not to drive until she saw someone for vision loss.   Reviewed notes, labs and imaging from outside physicians, which showed:  Reviewed notes from Dr. Inda Merlin.  At last visit patient was with her daughter to see Dr. Inda Merlin.  Daughter noticed memory issues for the last 6 months and feels they are progressing rapidly.  The patient admits that they have been going on for more than a year.  The memory problems are with short-term memory.  She is still able to take care of herself and drives short distances.  She sometimes have a hard time deciding how to get somewhere but is usually able to figure it out.  Has not forgotten how to do things at home.  She lives with her husband but her daughter usually in the home most days.  Her A1c's of 11 months ago was 8.2.  So apparently not well controlled diabetes.  Mini-Mental status exam was moderately diminished in the 24-26 range.  Less than 15 animals were recalled on  fluency.  She tested B12 which was 295.  Hemoglobin A1c was 6.4.  Vitamin D was extremely low at 18.3.  TSH normal.  BUN 8 and creatinine 0.82.  CBC unremarkable.  These labs were taken June 12, 2018. They deny dementia. Mother and Father with dementia. No alcohol use in the past.    Personally reviewed MRI images and agree with the following:  1. Moderate periventricular and subcortical T2 changes bilaterally. This likely reflects the sequela of chronic microvascular  ischemia and may contribute to chronic memory loss. 2. No acute intracranial abnormalities. 3. Bilateral mastoid effusions. No obstructing nasopharyngeal lesion is present.  Review of Systems: Patient complains of symptoms per HPI as well as the following symptoms: memory loss. Pertinent negatives and positives per HPI. All others negative.   Social History   Socioeconomic History   Marital status: Married    Spouse name: Not on file   Number of children: 2   Years of education: Not on file   Highest education level: Master's degree (e.g., MA, MS, MEng, MEd, MSW, MBA)  Occupational History   Not on file  Tobacco Use   Smoking status: Never Smoker   Smokeless tobacco: Never Used  Substance and Sexual Activity   Alcohol use: Never   Drug use: Never   Sexual activity: Yes  Other Topics Concern   Not on file  Social History Narrative   Lives at home with husband   Right handed   Caffeine: never   Social Determinants of Radio broadcast assistant Strain:    Difficulty of Paying Living Expenses: Not on file  Food Insecurity:    Worried About Charity fundraiser in the Last Year: Not on file   [employer] of Food in the Last Year: Not on file  Transportation Needs:    Lack of Transportation (Medical): Not on file   Lack of Transportation (Non-Medical): Not on file  Physical Activity:    Days of Exercise per Week: Not on file   Minutes of Exercise per Session: Not on file  Stress:    Feeling of Stress : Not on file  Social Connections:    Frequency of Communication with Friends and Family: Not on file   Frequency of Social Gatherings with Friends and Family: Not on file   Attends Religious Services: Not on file   Active Member of Clubs or Organizations: Not on file   Attends Archivist Meetings: Not on file   Marital Status: Not on file  Intimate Partner Violence:    Fear of Current or Ex-Partner: Not on file   Emotionally Abused:  Not on file   Physically Abused: Not on file   Sexually Abused: Not on file    Family History  Problem Relation Age of Onset   Cancer Maternal Grandmother    Dementia Mother    Other Father        possible Dementia    Past Medical History:  Diagnosis Date   Bradycardia 06/2018   Breast cancer ([facility])    Cancer ([place]) left    breast cancer 2005 surgery, chemo and radiation    Complete heart block ([place])    Diabetes mellitus    Headache    Hypercholesteremia    Macular degeneration    Osteoarthritis    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   Positive PPD    h/o, 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7   Vitamin D deficiency    Vulvar cancer ([place]) 2017 new dx  Past Surgical History:  Procedure Laterality Date   ABDOMINAL HYSTERECTOMY     complete   BREAST SURGERY Left 2006   left breast lumpectomy , chemo and radiation done   COLONOSCOPY WITH PROPOFOL N/A 07/24/2015   Procedure: COLONOSCOPY WITH PROPOFOL;  Surgeon: Garlan Fair, MD;  Location: [facility];  Service: Endoscopy;  Laterality: N/A;   EYE SURGERY     removal of growth 35 years ago   HEMORROIDECTOMY     38 years ago, no present symtoms   KNEE SURGERY Bilateral    PACEMAKER IMPLANT N/A 07/16/2018   Procedure: PACEMAKER IMPLANT;  Surgeon: Evans Lance, MD;  Location: [place] [facility];  Service: Cardiovascular;  Laterality: N/A;   RADICAL VULVECTOMY N/A 02/01/2016   Procedure: RADICAL VULVECTOMY;  Surgeon: [name], MD;  Location: [facility];  Service: Gynecology;  Laterality: N/A;    Current Outpatient Medications  Medication Sig Dispense Refill   Cyanocobalamin (VITAMIN B 12 PO) Take 1,000 mcg by mouth daily.     donepezil (ARICEPT) 10 MG tablet Take 10 mg by mouth at bedtime.     ergocalciferol (VITAMIN D2) 50000 UNITS capsule Take 50,000 Units by mouth every Tuesday.      flecainide (TAMBOCOR) 50 MG tablet Take 1 tablet (50 mg total) by mouth 2 (two) times daily. 180 tablet 3   metoprolol  tartrate (LOPRESSOR) 25 MG tablet Take 1 tablet (25 mg total) by mouth 2 (two) times daily. 180 tablet 2   Omega-3 Fatty Acids (FISH OIL) 1200 MG CAPS Take 2 capsules by mouth every morning.     sertraline (ZOLOFT) 100 MG tablet Take 1 tablet (100 mg total) by mouth daily. 90 tablet 3   No current facility-administered medications for this visit.    Allergies as of 11/30/2019 - Review Complete 11/30/2019  Allergen Reaction Noted   Duloxetine Itching 01/24/2016    Vitals: BP 126/70 (BP Location: Right Arm, Patient Position: Sitting)    Pulse 74    Temp (!) 97 F (36.1 C) Comment: taken at front; son 97.5 oral   Ht 5' (1.524 m)    Wt 143 lb (64.9 kg)    BMI 27.93 kg/m  Last Weight:  Wt Readings from Last 1 Encounters:  11/30/19 143 lb (64.9 kg)   Last Height:   Ht Readings from Last 1 Encounters:  11/30/19 5' (1.524 m)    Physical exam: Exam: Gen: NAD                   Neuro: Detailed Neurologic Exam  Speech:    Speech is normal;  Cognition:  MMSE - Mini Mental State Exam 11/30/2019 09/01/2018  Orientation to time 4 3  Orientation to Place 4 5  Registration 3 3  Attention/ Calculation 3 2  Recall 0 1  Language- name 2 objects 2 2  Language- repeat 1 1  Language- follow 3 step command 3 3  Language- read & follow direction 1 1  Write a sentence 1 1  Copy design 0 1  Total score 22 23    Cranial Nerves:    The pupils are equal, round, and reactive to light.  Visual fields are full to finger confrontation. Extraocular movements are intact. Trigeminal sensation is intact and the muscles of mastication are normal. The face is symmetric. The palate elevates in the midline. Hearing intact. Voice is normal. Shoulder shrug is normal. The tongue has normal motion without fasciculations.   Coordination:    No  dysmetria  Gait:    Normal stride and clearance  Motor Observation:    no involuntary movements noted. Tone:    Normal muscle tone.    Posture:    Posture  is normal. normal erect    Strength:    Strength is V/V in the upper and lower limbs.      Sensation: intact to LT     Reflex Exam:      Assessment/Plan:   74 y.o. female here as requested by Dr. Inda Merlin for memory loss initially evaluated in 08/2018 but canceled follow up, did not complete workup, here today for dementia. PMHx HTN, edema, DM2, vulvar cancer, history of breast cancer, macular degeneration, osteoarthritis. Short term memory loss. Mother had dementia.  They canceled their follow-up appointment and never came back. Labs unremarkable at that time 08/2018: homocysteine, bmp, b1, mma, b12(elevated 1796, asked her to decrease the amount of b12 she was taking).Today she reports her memory is worse. We will reorder formal neuropsych testing and aricept. MMSE is stable 22/30 (last 23/30 in 08/2018). I also recommended therapy and psychiatry for her depression in 08/2018.  - MRI of the brain with mod-severe white matter changes which may raise suspicion of a vascular component.  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 She is not eating, decreased socialization. Increase Zoloft. Encouraged her and son in the past to seek treatment with therapy and psychiatry, discussed again today - continue Aricept. Start Namenda at next appointment if appropriate after formal memory testing - Formal memory testing ordered  Meds ordered this encounter  Medications   sertraline (ZOLOFT) 100 MG tablet    Sig: Take 1 tablet (100 mg total) by mouth daily.    Dispense:  90 tablet    Refill:  3    90 day supply per request   Orders Placed This Encounter  Procedures   Ambulatory referral to Neuropsychology    Sarina Ill, MD  [facility] [address] [place],  [place] [address]  Phone [phone] Fax [phone]  I spent 45 minutes of face-to-face and non-face-to-face time with patient on the  1. Memory loss   2. Depression, unspecified depression type    diagnosis.  This included previsit chart review, lab review, study review, order entry, electronic health record documentation, patient education on the different diagnostic and therapeutic options, counseling and coordination of care, risks and benefits of management, compliance, or risk factor reduction

## 2019-11-30 ENCOUNTER — Ambulatory Visit (INDEPENDENT_AMBULATORY_CARE_PROVIDER_SITE_OTHER): Payer: Medicare PPO | Admitting: Neurology

## 2019-11-30 ENCOUNTER — Encounter: Payer: Self-pay | Admitting: Neurology

## 2019-11-30 ENCOUNTER — Other Ambulatory Visit: Payer: Self-pay

## 2019-11-30 VITALS — BP 126/70 | HR 74 | Temp 97.0°F | Ht 60.0 in | Wt 143.0 lb

## 2019-11-30 DIAGNOSIS — R413 Other amnesia: Secondary | ICD-10-CM | POA: Diagnosis not present

## 2019-11-30 DIAGNOSIS — F329 Major depressive disorder, single episode, unspecified: Secondary | ICD-10-CM

## 2019-11-30 DIAGNOSIS — F32A Depression, unspecified: Secondary | ICD-10-CM

## 2019-11-30 MED ORDER — SERTRALINE HCL 100 MG PO TABS: 100.0000 mg | ORAL_TABLET | Freq: Every day | ORAL | 3 refills | Status: AC

## 2019-11-30 NOTE — Patient Instructions (Addendum)
Increase zoloft Formal memory testing   Major Depressive Disorder, Adult Major depressive disorder (MDD) is a mental health condition. MDD often makes you feel sad, hopeless, or helpless. MDD can also cause symptoms in your body. MDD can affect your:  Work.  School.  Relationships.  Other normal activities. MDD can range from mild to very bad. It may occur once (single episode MDD). It can also occur many times (recurrent MDD). The main symptoms of MDD often include:  Feeling sad, depressed, or irritable most of the time.  Loss of interest. MDD symptoms also include:  Sleeping too much or too little.  Eating too much or too little.  A change in your weight.  Feeling tired (fatigue) or having low energy.  Feeling worthless.  Feeling guilty.  Trouble making decisions.  Trouble thinking clearly.  Thoughts of suicide or harming others.  Feeling weak.  Feeling agitated.  Keeping yourself from being around other people (isolation). Follow these instructions at home: Activity  Do these things as told by your doctor: ? Go back to your normal activities. ? Exercise regularly. ? Spend time outdoors. Alcohol  Talk with your doctor about how alcohol can affect your antidepressant medicines.  Do not drink alcohol. Or, limit how much alcohol you drink. ? This means no more than 1 drink a day for nonpregnant women and 2 drinks a day for men. One drink equals one of these:  12 oz of beer.  5 oz of wine.  1 oz of hard liquor. General instructions  Take over-the-counter and prescription medicines only as told by your doctor.  Eat a healthy diet.  Get plenty of sleep.  Find activities that you enjoy. Make time to do them.  Think about joining a support group. Your doctor may be able to suggest a group for you.  Keep all follow-up visits as told by your doctor. This is important. Where to find more information:  [employer] on Mental  Illness: ? www.nami.[place]: ? https://carter.com/  National Suicide Prevention Lifeline: ? 470-771-8830. This is free, 24-hour help. Contact a doctor if:  Your symptoms get worse.  You have new symptoms. Get help right away if:  You self-harm.  You see, hear, taste, smell, or feel things that are not present (hallucinate). If you ever feel like you may hurt yourself or others, or have thoughts about taking your own life, get help right away. You can go to your nearest emergency department or call:  Your local emergency services (911 in the [place]  A suicide crisis helpline, such as the National Suicide Prevention Lifeline: ? 418-526-8688. This is open 24 hours a day. This information is not intended to replace advice given to you by your health care provider. Make sure you discuss any questions you have with your health care provider. Document Revised: 08/22/2017 Document Reviewed: 05/26/2016 Elsevier Patient Education  [place].  Sertraline tablets What is this medicine? SERTRALINE (SER tra leen) is used to treat depression. It may also be used to treat obsessive compulsive disorder, panic disorder, post-trauma stress, premenstrual dysphoric disorder (PMDD) or social anxiety. This medicine may be used for other purposes; ask your health care provider or pharmacist if you have questions. COMMON BRAND NAME(S): Zoloft What should I tell my health care provider before I take this medicine? They need to know if you have any of these conditions:  bleeding disorders  bipolar disorder or a family history of bipolar disorder  glaucoma  heart disease  high blood pressure  history of irregular heartbeat  history of low levels of calcium, magnesium, or potassium in the blood  if you often drink alcohol  liver disease  receiving electroconvulsive therapy  seizures  suicidal thoughts, plans, or attempt; a previous suicide  attempt by you or a family member  take medicines that treat or prevent blood clots  thyroid disease  an unusual or allergic reaction to sertraline, other medicines, foods, dyes, or preservatives  pregnant or trying to get pregnant  breast-feeding How should I use this medicine? Take this medicine by mouth with a glass of water. Follow the directions on the prescription label. You can take it with or without food. Take your medicine at regular intervals. Do not take your medicine more often than directed. Do not stop taking this medicine suddenly except upon the advice of your doctor. Stopping this medicine too quickly may cause serious side effects or your condition may worsen. A special MedGuide will be given to you by the pharmacist with each prescription and refill. Be sure to read this information carefully each time. Talk to your pediatrician regarding the use of this medicine in children. While this drug may be prescribed for children as young as 7 years for selected conditions, precautions do apply. Overdosage: If you think you have taken too much of this medicine contact a poison control center or emergency room at once. NOTE: This medicine is only for you. Do not share this medicine with others. What if I miss a dose? If you miss a dose, take it as soon as you can. If it is almost time for your next dose, take only that dose. Do not take double or extra doses. What may interact with this medicine? Do not take this medicine with any of the following medications:  cisapride  dronedarone  linezolid  MAOIs like Carbex, Eldepryl, Marplan, Nardil, and Parnate  methylene blue (injected into a vein)  pimozide  thioridazine This medicine may also interact with the following medications:  alcohol  amphetamines  aspirin and aspirin-like medicines  certain medicines for depression, anxiety, or psychotic disturbances  certain medicines for fungal infections like  ketoconazole, fluconazole, posaconazole, and itraconazole  certain medicines for irregular heart beat like flecainide, quinidine, propafenone  certain medicines for migraine headaches like almotriptan, eletriptan, frovatriptan, naratriptan, rizatriptan, sumatriptan, zolmitriptan  certain medicines for sleep  certain medicines for seizures like carbamazepine, valproic acid, phenytoin  certain medicines that treat or prevent blood clots like warfarin, enoxaparin, dalteparin  cimetidine  digoxin  diuretics  fentanyl  isoniazid  lithium  NSAIDs, medicines for pain and inflammation, like ibuprofen or naproxen  other medicines that prolong the QT interval (cause an abnormal heart rhythm) like dofetilide  rasagiline  safinamide  supplements like St. John's wort, kava kava, valerian  tolbutamide  tramadol  tryptophan This list may not describe all possible interactions. Give your health care provider a list of all the medicines, herbs, non-prescription drugs, or dietary supplements you use. Also tell them if you smoke, drink alcohol, or use illegal drugs. Some items may interact with your medicine. What should I watch for while using this medicine? Tell your doctor if your symptoms do not get better or if they get worse. Visit your doctor or health care professional for regular checks on your progress. Because it may take several weeks to see the full effects of this medicine, it is important to continue your treatment as prescribed by your doctor.  Patients and their families should watch out for new or worsening thoughts of suicide or depression. Also watch out for sudden changes in feelings such as feeling anxious, agitated, panicky, irritable, hostile, aggressive, impulsive, severely restless, overly excited and hyperactive, or not being able to sleep. If this happens, especially at the beginning of treatment or after a change in dose, call your health care professional. Dennis Bast may  get drowsy or dizzy. Do not drive, use machinery, or do anything that needs mental alertness until you know how this medicine affects you. Do not stand or sit up quickly, especially if you are an older patient. This reduces the risk of dizzy or fainting spells. Alcohol may interfere with the effect of this medicine. Avoid alcoholic drinks. Your mouth may get dry. Chewing sugarless gum or sucking hard candy, and drinking plenty of water may help. Contact your doctor if the problem does not go away or is severe. What side effects may I notice from receiving this medicine? Side effects that you should report to your doctor or health care professional as soon as possible:  allergic reactions like skin rash, itching or hives, swelling of the face, lips, or tongue  anxious  black, tarry stools  changes in vision  confusion  elevated mood, decreased need for sleep, racing thoughts, impulsive behavior  eye pain  fast, irregular heartbeat  feeling faint or lightheaded, falls  feeling agitated, angry, or irritable  hallucination, loss of contact with reality  loss of balance or coordination  loss of memory  painful or prolonged erections  restlessness, pacing, inability to keep still  seizures  stiff muscles  suicidal thoughts or other mood changes  trouble sleeping  unusual bleeding or bruising  unusually weak or tired  vomiting Side effects that usually do not require medical attention (report to your doctor or health care professional if they continue or are bothersome):  change in appetite or weight  change in sex drive or performance  diarrhea  increased sweating  indigestion, nausea  tremors This list may not describe all possible side effects. Call your doctor for medical advice about side effects. You may report side effects to FDA at 1-800-FDA-1088. Where should I keep my medicine? Keep out of the reach of children. Store at room temperature between 15  and 30 degrees C (59 and 86 degrees F). Throw away any unused medicine after the expiration date. NOTE: This sheet is a summary. It may not cover all possible information. If you have questions about this medicine, talk to your doctor, pharmacist, or health care provider.  2020 Elsevier/Gold Standard (2018-09-01 10:09:27)

## 2019-12-06 ENCOUNTER — Encounter: Payer: Self-pay | Admitting: Psychology

## 2020-01-18 ENCOUNTER — Ambulatory Visit (INDEPENDENT_AMBULATORY_CARE_PROVIDER_SITE_OTHER): Payer: Medicare PPO | Admitting: *Deleted

## 2020-01-18 DIAGNOSIS — R001 Bradycardia, unspecified: Secondary | ICD-10-CM | POA: Diagnosis not present

## 2020-01-18 LAB — CUP PACEART REMOTE DEVICE CHECK
Battery Remaining Longevity: 100 mo
Battery Voltage: 2.99 V
Brady Statistic AP VP Percent: 54.41 %
Brady Statistic AP VS Percent: 0.12 %
Brady Statistic AS VP Percent: 44.14 %
Brady Statistic AS VS Percent: 1.33 %
Brady Statistic RA Percent Paced: 54.86 %
Brady Statistic RV Percent Paced: 98.55 %
Date Time Interrogation Session: 20210426220623
Implantable Lead Implant Date: 20191024
Implantable Lead Implant Date: 20191024
Implantable Lead Location: 753859
Implantable Lead Location: 753860
Implantable Lead Model: 3830
Implantable Lead Model: 5076
Implantable Pulse Generator Implant Date: 20191024
Lead Channel Impedance Value: 323 Ohm
Lead Channel Impedance Value: 361 Ohm
Lead Channel Impedance Value: 437 Ohm
Lead Channel Impedance Value: 475 Ohm
Lead Channel Pacing Threshold Amplitude: 0.625 V
Lead Channel Pacing Threshold Amplitude: 1.625 V
Lead Channel Pacing Threshold Pulse Width: 0.4 ms
Lead Channel Pacing Threshold Pulse Width: 0.4 ms
Lead Channel Sensing Intrinsic Amplitude: 2.5 mV
Lead Channel Sensing Intrinsic Amplitude: 2.5 mV
Lead Channel Sensing Intrinsic Amplitude: 21 mV
Lead Channel Sensing Intrinsic Amplitude: 21 mV
Lead Channel Setting Pacing Amplitude: 2 V
Lead Channel Setting Pacing Amplitude: 3.25 V
Lead Channel Setting Pacing Pulse Width: 0.4 ms
Lead Channel Setting Sensing Sensitivity: 1.2 mV

## 2020-01-19 NOTE — Progress Notes (Signed)
PPM Remote  

## 2020-02-02 DIAGNOSIS — R197 Diarrhea, unspecified: Secondary | ICD-10-CM | POA: Diagnosis not present

## 2020-02-03 ENCOUNTER — Telehealth: Payer: Self-pay | Admitting: Neurology

## 2020-02-03 DIAGNOSIS — R197 Diarrhea, unspecified: Secondary | ICD-10-CM | POA: Diagnosis not present

## 2020-02-03 NOTE — Telephone Encounter (Signed)
Called pt to schedule a f/u in August per Hinton Dyer C. VM full unable to contact

## 2020-02-03 NOTE — Telephone Encounter (Signed)
Pt has called back and was scheduled a f/u in August with Amy,NP

## 2020-02-15 DIAGNOSIS — K921 Melena: Secondary | ICD-10-CM | POA: Diagnosis not present

## 2020-02-15 DIAGNOSIS — R634 Abnormal weight loss: Secondary | ICD-10-CM | POA: Diagnosis not present

## 2020-02-15 DIAGNOSIS — R197 Diarrhea, unspecified: Secondary | ICD-10-CM | POA: Diagnosis not present

## 2020-02-24 DIAGNOSIS — R197 Diarrhea, unspecified: Secondary | ICD-10-CM | POA: Diagnosis not present

## 2020-03-17 DIAGNOSIS — H2513 Age-related nuclear cataract, bilateral: Secondary | ICD-10-CM | POA: Diagnosis not present

## 2020-03-17 DIAGNOSIS — H40033 Anatomical narrow angle, bilateral: Secondary | ICD-10-CM | POA: Diagnosis not present

## 2020-04-04 DIAGNOSIS — Z1159 Encounter for screening for other viral diseases: Secondary | ICD-10-CM | POA: Diagnosis not present

## 2020-04-05 ENCOUNTER — Telehealth: Payer: Self-pay | Admitting: Neurology

## 2020-04-05 NOTE — Telephone Encounter (Signed)
I have talked to Patient's son and relayed I can get patient in to see Dr. Eulas Post  a lot sooner telephone  [phone]- fax [phone]  Patient want's to take his mother there .  Patient is scheduled with Amy in august  I  CX follow up  . Patient's son will call back and schedule follow after her apt is scheduled with neuropsychologist . I hope this was ok ?

## 2020-04-05 NOTE — Telephone Encounter (Signed)
Pt son [facility]) is asking for a call to discuss pt needing to be scheduled for her memory test.  The last place pt was referred to can not see pt before Oct.  Son does not want to wait that long as pt is worsening.  Please call with another location for pt to have memory test.

## 2020-04-05 NOTE — Telephone Encounter (Signed)
Noted  

## 2020-04-06 ENCOUNTER — Encounter: Payer: Self-pay | Admitting: Psychology

## 2020-04-06 DIAGNOSIS — D125 Benign neoplasm of sigmoid colon: Secondary | ICD-10-CM | POA: Diagnosis not present

## 2020-04-06 DIAGNOSIS — B9681 Helicobacter pylori [H. pylori] as the cause of diseases classified elsewhere: Secondary | ICD-10-CM | POA: Diagnosis not present

## 2020-04-06 DIAGNOSIS — D122 Benign neoplasm of ascending colon: Secondary | ICD-10-CM | POA: Diagnosis not present

## 2020-04-06 DIAGNOSIS — K625 Hemorrhage of anus and rectum: Secondary | ICD-10-CM | POA: Diagnosis not present

## 2020-04-06 DIAGNOSIS — K2930 Chronic superficial gastritis without bleeding: Secondary | ICD-10-CM | POA: Diagnosis not present

## 2020-04-06 DIAGNOSIS — K6389 Other specified diseases of intestine: Secondary | ICD-10-CM | POA: Diagnosis not present

## 2020-04-06 DIAGNOSIS — R197 Diarrhea, unspecified: Secondary | ICD-10-CM | POA: Diagnosis not present

## 2020-04-06 DIAGNOSIS — K3189 Other diseases of stomach and duodenum: Secondary | ICD-10-CM | POA: Diagnosis not present

## 2020-04-06 DIAGNOSIS — D509 Iron deficiency anemia, unspecified: Secondary | ICD-10-CM | POA: Diagnosis not present

## 2020-04-06 DIAGNOSIS — D123 Benign neoplasm of transverse colon: Secondary | ICD-10-CM | POA: Diagnosis not present

## 2020-04-06 NOTE — Telephone Encounter (Signed)
Called son and left him a message relaying Dr. Eulas Post does not take [facility] . I would like to talk to son about other place to send his mother.

## 2020-04-11 ENCOUNTER — Other Ambulatory Visit: Payer: Self-pay | Admitting: Nurse Practitioner

## 2020-04-11 ENCOUNTER — Ambulatory Visit: Payer: Medicare PPO | Admitting: Psychology

## 2020-04-11 DIAGNOSIS — D071 Carcinoma in situ of vulva: Secondary | ICD-10-CM | POA: Diagnosis not present

## 2020-04-11 DIAGNOSIS — N909 Noninflammatory disorder of vulva and perineum, unspecified: Secondary | ICD-10-CM | POA: Diagnosis not present

## 2020-04-11 DIAGNOSIS — N903 Dysplasia of vulva, unspecified: Secondary | ICD-10-CM | POA: Diagnosis not present

## 2020-04-13 DIAGNOSIS — D123 Benign neoplasm of transverse colon: Secondary | ICD-10-CM | POA: Diagnosis not present

## 2020-04-13 DIAGNOSIS — K2930 Chronic superficial gastritis without bleeding: Secondary | ICD-10-CM | POA: Diagnosis not present

## 2020-04-13 DIAGNOSIS — K6389 Other specified diseases of intestine: Secondary | ICD-10-CM | POA: Diagnosis not present

## 2020-04-13 DIAGNOSIS — D125 Benign neoplasm of sigmoid colon: Secondary | ICD-10-CM | POA: Diagnosis not present

## 2020-04-13 DIAGNOSIS — B9681 Helicobacter pylori [H. pylori] as the cause of diseases classified elsewhere: Secondary | ICD-10-CM | POA: Diagnosis not present

## 2020-04-18 ENCOUNTER — Ambulatory Visit (INDEPENDENT_AMBULATORY_CARE_PROVIDER_SITE_OTHER): Payer: Medicare PPO | Admitting: *Deleted

## 2020-04-18 DIAGNOSIS — I442 Atrioventricular block, complete: Secondary | ICD-10-CM

## 2020-04-19 LAB — CUP PACEART REMOTE DEVICE CHECK
Battery Remaining Longevity: 87 mo
Battery Voltage: 2.98 V
Brady Statistic AP VP Percent: 27.76 %
Brady Statistic AP VS Percent: 0.23 %
Brady Statistic AS VP Percent: 67.46 %
Brady Statistic AS VS Percent: 4.56 %
Brady Statistic RA Percent Paced: 29.08 %
Brady Statistic RV Percent Paced: 95.19 %
Date Time Interrogation Session: 20210726201258
Implantable Lead Implant Date: 20191024
Implantable Lead Implant Date: 20191024
Implantable Lead Location: 753859
Implantable Lead Location: 753860
Implantable Lead Model: 3830
Implantable Lead Model: 5076
Implantable Pulse Generator Implant Date: 20191024
Lead Channel Impedance Value: 285 Ohm
Lead Channel Impedance Value: 323 Ohm
Lead Channel Impedance Value: 418 Ohm
Lead Channel Impedance Value: 437 Ohm
Lead Channel Pacing Threshold Amplitude: 0.625 V
Lead Channel Pacing Threshold Amplitude: 1.625 V
Lead Channel Pacing Threshold Pulse Width: 0.4 ms
Lead Channel Pacing Threshold Pulse Width: 0.4 ms
Lead Channel Sensing Intrinsic Amplitude: 2 mV
Lead Channel Sensing Intrinsic Amplitude: 2 mV
Lead Channel Sensing Intrinsic Amplitude: 5.875 mV
Lead Channel Sensing Intrinsic Amplitude: 5.875 mV
Lead Channel Setting Pacing Amplitude: 2 V
Lead Channel Setting Pacing Amplitude: 3.5 V
Lead Channel Setting Pacing Pulse Width: 0.4 ms
Lead Channel Setting Sensing Sensitivity: 1.2 mV

## 2020-04-20 DIAGNOSIS — B9681 Helicobacter pylori [H. pylori] as the cause of diseases classified elsewhere: Secondary | ICD-10-CM | POA: Diagnosis not present

## 2020-04-20 DIAGNOSIS — N9089 Other specified noninflammatory disorders of vulva and perineum: Secondary | ICD-10-CM | POA: Diagnosis not present

## 2020-04-20 DIAGNOSIS — C519 Malignant neoplasm of vulva, unspecified: Secondary | ICD-10-CM | POA: Diagnosis not present

## 2020-04-20 DIAGNOSIS — D126 Benign neoplasm of colon, unspecified: Secondary | ICD-10-CM | POA: Diagnosis not present

## 2020-04-20 DIAGNOSIS — K2970 Gastritis, unspecified, without bleeding: Secondary | ICD-10-CM | POA: Diagnosis not present

## 2020-04-20 DIAGNOSIS — R197 Diarrhea, unspecified: Secondary | ICD-10-CM | POA: Diagnosis not present

## 2020-04-21 NOTE — Progress Notes (Signed)
Remote pacemaker transmission.   

## 2020-04-25 ENCOUNTER — Encounter: Payer: Medicare PPO | Attending: Psychology | Admitting: Psychology

## 2020-04-25 DIAGNOSIS — M25562 Pain in left knee: Secondary | ICD-10-CM | POA: Diagnosis not present

## 2020-04-25 DIAGNOSIS — F0789 Other personality and behavioral disorders due to known physiological condition: Secondary | ICD-10-CM | POA: Insufficient documentation

## 2020-04-25 DIAGNOSIS — M25561 Pain in right knee: Secondary | ICD-10-CM | POA: Diagnosis not present

## 2020-04-25 DIAGNOSIS — R413 Other amnesia: Secondary | ICD-10-CM | POA: Insufficient documentation

## 2020-04-25 DIAGNOSIS — M170 Bilateral primary osteoarthritis of knee: Secondary | ICD-10-CM | POA: Diagnosis not present

## 2020-04-25 DIAGNOSIS — F09 Unspecified mental disorder due to known physiological condition: Secondary | ICD-10-CM | POA: Insufficient documentation

## 2020-05-02 ENCOUNTER — Encounter: Payer: Self-pay | Admitting: Psychology

## 2020-05-02 DIAGNOSIS — C519 Malignant neoplasm of vulva, unspecified: Secondary | ICD-10-CM | POA: Diagnosis not present

## 2020-05-04 ENCOUNTER — Other Ambulatory Visit: Payer: Self-pay

## 2020-05-04 ENCOUNTER — Encounter: Payer: Medicare PPO | Admitting: Psychology

## 2020-05-04 DIAGNOSIS — R413 Other amnesia: Secondary | ICD-10-CM

## 2020-05-04 DIAGNOSIS — F0789 Other personality and behavioral disorders due to known physiological condition: Secondary | ICD-10-CM

## 2020-05-04 DIAGNOSIS — F09 Unspecified mental disorder due to known physiological condition: Secondary | ICD-10-CM

## 2020-05-10 ENCOUNTER — Ambulatory Visit: Payer: Medicare PPO | Admitting: Family Medicine

## 2020-05-10 DIAGNOSIS — Z01812 Encounter for preprocedural laboratory examination: Secondary | ICD-10-CM | POA: Diagnosis not present

## 2020-05-10 DIAGNOSIS — Z01810 Encounter for preprocedural cardiovascular examination: Secondary | ICD-10-CM | POA: Diagnosis not present

## 2020-05-10 DIAGNOSIS — C519 Malignant neoplasm of vulva, unspecified: Secondary | ICD-10-CM | POA: Diagnosis not present

## 2020-05-10 DIAGNOSIS — Z20822 Contact with and (suspected) exposure to covid-19: Secondary | ICD-10-CM | POA: Diagnosis not present

## 2020-05-11 DIAGNOSIS — C519 Malignant neoplasm of vulva, unspecified: Secondary | ICD-10-CM | POA: Diagnosis not present

## 2020-05-12 ENCOUNTER — Telehealth: Payer: Self-pay | Admitting: Internal Medicine

## 2020-05-12 NOTE — Telephone Encounter (Signed)
   [facility] Pre-operative Risk Assessment    Request for surgical clearance:  1. What type of surgery is being performed?  Vaginal Exam (w/ anesthesia) or Radical Vulvectomy  2. When is this surgery scheduled? 05/16/20   3. What type of clearance is required (medical clearance vs. Pharmacy clearance to hold med vs. Both)? Medical clearance  4. Are there any medications that need to be held prior to surgery and how long? Terrence Dupont states she is not sure    5. Practice name and name of physician performing surgery? [facility]   6. What is your office phone number? [phone]   7.   What is your office fax number? Terrence Dupont states she is not sure    8.   Anesthesia type (None, local, MAC, general) ? Terrence Dupont states she is not sure   Zara Council 05/12/2020, 9:27 AM  _________________________________________________________________   (provider comments below)

## 2020-05-13 NOTE — Telephone Encounter (Signed)
   Primary Cardiologist: Dr. Lovena Le  Chart reviewed as part of pre-operative protocol coverage. Because of [name]'s past medical history and time since last visit, they will require a follow-up visit in order to better assess preoperative cardiovascular risk.  Pre-op covering staff: - Please schedule appointment and call patient to inform them. If patient already had an upcoming appointment within acceptable timeframe, please add "pre-op clearance" to the appointment notes so provider is aware. - Please contact requesting surgeon's office via preferred method (i.e, phone, fax) to inform them of need for appointment prior to surgery.  If applicable, this message will also be routed to pharmacy pool and/or primary cardiologist for input on holding anticoagulant/antiplatelet agent as requested below so that this information is available to the clearing provider at time of patient's appointment.   [place], [place]  05/13/2020, 3:39 PM

## 2020-05-15 ENCOUNTER — Telehealth: Payer: Self-pay

## 2020-05-15 ENCOUNTER — Telehealth: Payer: Self-pay | Admitting: Internal Medicine

## 2020-05-15 ENCOUNTER — Ambulatory Visit (INDEPENDENT_AMBULATORY_CARE_PROVIDER_SITE_OTHER): Payer: Medicare PPO | Admitting: Internal Medicine

## 2020-05-15 ENCOUNTER — Other Ambulatory Visit: Payer: Self-pay

## 2020-05-15 VITALS — BP 122/54 | Ht 60.0 in | Wt 135.0 lb

## 2020-05-15 DIAGNOSIS — Z950 Presence of cardiac pacemaker: Secondary | ICD-10-CM

## 2020-05-15 DIAGNOSIS — I442 Atrioventricular block, complete: Secondary | ICD-10-CM | POA: Diagnosis not present

## 2020-05-15 NOTE — Telephone Encounter (Signed)
The patient has an appointment this afternoon at 3:15 with Dr Lovena Le for pre op clearance. I spoke with her on the phone this morning.  She has been doing well from a cardiac standpoint.  I would expect her to be cleared later today pending Dr Tanna Furry evaluation.   Kerin Ransom PA-C 05/15/2020 10:47 AM

## 2020-05-15 NOTE — Patient Instructions (Signed)
Medication Instructions:  Your physician recommends that you continue on your current medications as directed. Please refer to the Current Medication list given to you today.  Labwork: None ordered.  Testing/Procedures: None ordered.  Follow-Up: Your physician wants you to follow-up in: one year with Dr. Lovena Le.   You will receive a reminder letter in the mail two months in advance. If you don't receive a letter, please call our office to schedule the follow-up appointment.  Remote monitoring is used to monitor your Pacemaker from home. This monitoring reduces the number of office visits required to check your device to one time per year. It allows [place] to keep an eye on the functioning of your device to ensure it is working properly. You are scheduled for a device check from home on 07/18/2020. You may send your transmission at any time that day. If you have a wireless device, the transmission will be sent automatically. After your physician reviews your transmission, you will receive a postcard with your next transmission date.  Any Other Special Instructions Will Be Listed Below (If Applicable).  If you need a refill on your cardiac medications before your next appointment, please call your pharmacy.

## 2020-05-15 NOTE — Telephone Encounter (Signed)
error 

## 2020-05-15 NOTE — Telephone Encounter (Signed)
Spoke with Terrence Dupont (PA with [facility] - phone number: [phone]).  She requests an ASAP appointment for surgical clearance since the patient's cancer has returned. She has surgery tentatively scheduled for tomorrow. Spoke with EP scheduler and the patient has been scheduled for a visit today with Dr. Lovena Le. Both patient and Terrence Dupont are aware.  Emma requests a call later to notify whether the patient is cleared for appropriate planning.

## 2020-05-15 NOTE — Telephone Encounter (Signed)
See 8/20 surgical clearance request.

## 2020-05-15 NOTE — Progress Notes (Signed)
HPI  Allergies  Allergen Reactions  . Duloxetine Itching    Cymbalta     Current Outpatient Medications  Medication Sig Dispense Refill  . Cyanocobalamin (VITAMIN B 12 PO) Take 1,000 mcg by mouth daily.    Marland Kitchen donepezil (ARICEPT) 10 MG tablet Take 10 mg by mouth at bedtime.    . ergocalciferol (VITAMIN D2) 50000 UNITS capsule Take 50,000 Units by mouth every Tuesday.     . flecainide (TAMBOCOR) 50 MG tablet Take 1 tablet (50 mg total) by mouth 2 (two) times daily. 180 tablet 3  . metoprolol tartrate (LOPRESSOR) 25 MG tablet Take 1 tablet (25 mg total) by mouth 2 (two) times daily. 180 tablet 2  . Omega-3 Fatty Acids (FISH OIL) 1200 MG CAPS Take 2 capsules by mouth every morning.    . sertraline (ZOLOFT) 100 MG tablet Take 1 tablet (100 mg total) by mouth daily. 90 tablet 3   No current facility-administered medications for this visit.     Past Medical History:  Diagnosis Date  . Bradycardia 06/2018  . Breast cancer ([place])   . Cancer [facility]) left    breast cancer 2005 surgery, chemo and radiation   . Complete heart block ([place])   . Diabetes mellitus   . Headache   . Hypercholesteremia   . Macular degeneration   . Osteoarthritis   . 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 Positive PPD    h/o, age 68  . Vitamin D deficiency   . Vulvar cancer ([place]) 2017 new dx    ROS:   All systems reviewed and negative except as noted in the HPI.   Past Surgical History:  Procedure Laterality Date  . ABDOMINAL HYSTERECTOMY     complete  . BREAST SURGERY Left 2006   left breast lumpectomy , chemo and radiation done  . COLONOSCOPY WITH PROPOFOL N/A 07/24/2015   Procedure: COLONOSCOPY WITH PROPOFOL;  Surgeon: Garlan Fair, MD;  Location: [facility];  Service: Endoscopy;  Laterality: N/A;  . EYE SURGERY     removal of growth 35 years ago  . HEMORROIDECTOMY     38 years ago, no present symtoms  . KNEE SURGERY Bilateral   . PACEMAKER IMPLANT N/A 07/16/2018    Procedure: PACEMAKER IMPLANT;  Surgeon: Evans Lance, MD;  Location: [facility];  Service: Cardiovascular;  Laterality: N/A;  . RADICAL VULVECTOMY N/A 02/01/2016   Procedure: RADICAL VULVECTOMY;  Surgeon: [name], MD;  Location: [facility];  Service: Gynecology;  Laterality: N/A;     Family History  Problem Relation Age of Onset  . Cancer Maternal Grandmother   . Dementia Mother   . Other Father        possible Dementia     Social History   Socioeconomic History  . Marital status: Married    Spouse name: Not on file  . Number of children: 2  . Years of education: Not on file  . Highest education level: Master's degree (e.g., MA, MS, MEng, MEd, MSW, MBA)  Occupational History  . Not on file  Tobacco Use  . Smoking status: Never Smoker  . Smokeless tobacco: Never Used  Vaping Use  . Vaping Use: Never used  Substance and Sexual Activity  . Alcohol use: Never  . Drug use: Never  . Sexual activity: Yes  Other Topics Concern  . Not on file  Social History Narrative   Lives at home with husband   Right handed   Caffeine:  never   Social Determinants of Radio broadcast assistant Strain:   . Difficulty of Paying Living Expenses: Not on file  Food Insecurity:   . Worried About Charity fundraiser in the Last Year: Not on file  . Ran Out of Food in the Last Year: Not on file  Transportation Needs:   . Lack of Transportation (Medical): Not on file  . Lack of Transportation (Non-Medical): Not on file  Physical Activity:   . Days of Exercise per Week: Not on file  . Minutes of Exercise per Session: Not on file  Stress:   . Feeling of Stress : Not on file  Social Connections:   . Frequency of Communication with Friends and Family: Not on file  . Frequency of Social Gatherings with Friends and Family: Not on file  . Attends Religious Services: Not on file  . Active Member of Clubs or Organizations: Not on file  . Attends Archivist Meetings: Not on  file  . Marital Status: Not on file  Intimate Partner Violence:   . Fear of Current or Ex-Partner: Not on file  . Emotionally Abused: Not on file  . Physically Abused: Not on file  . Sexually Abused: Not on file     BP (!) 122/54   Ht 5' (1.524 m)   Wt 135 lb (61.2 kg)   BMI 26.37 kg/m   Physical Exam:  Well appearing NAD HEENT: Unremarkable Neck:  No JVD, no thyromegally Lymphatics:  No adenopathy Back:  No CVA tenderness Lungs:  Clear HEART:  Regular rate rhythm, no murmurs, no rubs, no clicks Abd:  soft, positive bowel sounds, no organomegally, no rebound, no guarding Ext:  2 plus pulses, no edema, no cyanosis, no clubbing Skin:  No rashes no nodules Neuro:  CN II through XII intact, motor grossly intact  EKG - nsr with ventricular pacing  DEVICE  Normal device function.  See PaceArt for details.   Assess/Plan: 1. Preoperative evaluation - the patient is pending vulvar CA surgery, she is an acceptable surgical risk. She is able to walk on flat ground and does house work, using a vacuum. No chest pain. She has been weak.  2. CHB - she is asymptomatic, s/p PPM insertion. 3. 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 We will follow.  [name].

## 2020-05-15 NOTE — Telephone Encounter (Signed)
Myrtis Hopping PA following up on the clearance request she had called in for the on 8/20, patient is scheduled for surgery on 8/24. Virl Axe to have the patient call in to the office to schedule an appt since it has been a year since she was seen. The patient's doctor who is performing the surgery called after Terrence Dupont PA to stress the urgency in this surgery. Patient has a history of vulvar cancer, and they are concerned for reoccurrence. Please see staff message for Emma's personal contact.

## 2020-05-15 NOTE — Telephone Encounter (Signed)
Left detailed message for PA advising Pt cleared for surgery.

## 2020-05-16 DIAGNOSIS — R590 Localized enlarged lymph nodes: Secondary | ICD-10-CM | POA: Diagnosis not present

## 2020-05-16 DIAGNOSIS — E119 Type 2 diabetes mellitus without complications: Secondary | ICD-10-CM | POA: Diagnosis not present

## 2020-05-16 DIAGNOSIS — I480 Paroxysmal atrial fibrillation: Secondary | ICD-10-CM | POA: Diagnosis not present

## 2020-05-16 DIAGNOSIS — N94819 Vulvodynia, unspecified: Secondary | ICD-10-CM | POA: Diagnosis not present

## 2020-05-16 DIAGNOSIS — Z7982 Long term (current) use of aspirin: Secondary | ICD-10-CM | POA: Diagnosis not present

## 2020-05-16 DIAGNOSIS — I10 Essential (primary) hypertension: Secondary | ICD-10-CM | POA: Diagnosis not present

## 2020-05-16 DIAGNOSIS — Z950 Presence of cardiac pacemaker: Secondary | ICD-10-CM | POA: Diagnosis not present

## 2020-05-16 DIAGNOSIS — C519 Malignant neoplasm of vulva, unspecified: Secondary | ICD-10-CM | POA: Diagnosis not present

## 2020-05-16 DIAGNOSIS — K219 Gastro-esophageal reflux disease without esophagitis: Secondary | ICD-10-CM | POA: Diagnosis not present

## 2020-05-16 DIAGNOSIS — Z7984 Long term (current) use of oral hypoglycemic drugs: Secondary | ICD-10-CM | POA: Diagnosis not present

## 2020-05-16 NOTE — Telephone Encounter (Signed)
Pt saw Dr Lovena Le 08/23 for pre op clearance

## 2020-05-17 DIAGNOSIS — R590 Localized enlarged lymph nodes: Secondary | ICD-10-CM | POA: Diagnosis not present

## 2020-05-17 DIAGNOSIS — I480 Paroxysmal atrial fibrillation: Secondary | ICD-10-CM | POA: Diagnosis not present

## 2020-05-17 DIAGNOSIS — Z7982 Long term (current) use of aspirin: Secondary | ICD-10-CM | POA: Diagnosis not present

## 2020-05-17 DIAGNOSIS — I10 Essential (primary) hypertension: Secondary | ICD-10-CM | POA: Diagnosis not present

## 2020-05-17 DIAGNOSIS — Z7984 Long term (current) use of oral hypoglycemic drugs: Secondary | ICD-10-CM | POA: Diagnosis not present

## 2020-05-17 DIAGNOSIS — C519 Malignant neoplasm of vulva, unspecified: Secondary | ICD-10-CM | POA: Diagnosis not present

## 2020-05-17 DIAGNOSIS — E119 Type 2 diabetes mellitus without complications: Secondary | ICD-10-CM | POA: Diagnosis not present

## 2020-05-17 DIAGNOSIS — K219 Gastro-esophageal reflux disease without esophagitis: Secondary | ICD-10-CM | POA: Diagnosis not present

## 2020-05-17 DIAGNOSIS — N94819 Vulvodynia, unspecified: Secondary | ICD-10-CM | POA: Diagnosis not present

## 2020-05-18 ENCOUNTER — Encounter: Payer: Self-pay | Admitting: Psychology

## 2020-05-18 NOTE — Progress Notes (Signed)
Neuropsychological Consultation   Patient:   [name]   DOB:   1945-12-26  MR Number:  [id number]  Location:  [facility] FOR PAIN AND [facility] MEDICINE [facility] PHYSICAL MEDICINE AND REHABILITATION [place], [place] [id number] [place] [id number] Dept: [phone]           Date of Service:   05/04/2020  Start Time:   1 PM End Time:   3 PM  Today's visit was an in person visit that was conducted in my outpatient clinic office.  The patient, her son and myself were present for this visit.  The first hour and 15 minutes were spent during the clinical interview and the last 45 minutes was spent with records review and report writing.  Provider/Observer:  Ilean Skill, Psy.D.       Clinical Neuropsychologist       Billing Code/Service: 96116/96121  Chief Complaint:    [name] is a 74 year old female that was referred by Sarina Ill , MD with [facility] for neuropsychological evaluation as part of work-up over concerns around progressively worsening memory and other cognitive changes.  The patient was initially referred for neurological evaluation at the end of 2019 with follow-up neurological visit in March 2021.  The patient has a past medical history of hypertension, edema, type 2 diabetes, vulvar cancer, history of breast cancer, macular degeneration, osteoarthritis.  The patient and son both describe short-term memory loss, the development of geographic disorientation and increased word finding issues and speech/language changes.  Reason for Service:  [name] is a 74 year old female that was referred by Sarina Ill , MD with [facility] for neuropsychological evaluation as part of work-up over concerns around progressively worsening memory and other cognitive changes.  The patient was initially referred for neurological evaluation at the end of 2019 with follow-up neurological visit  in March 2021.  The patient has a past medical history of hypertension, edema, type 2 diabetes, vulvar cancer, history of breast cancer, macular degeneration, osteoarthritis.  The patient and son both describe short-term memory loss, the development of geographic disorientation and increased word finding issues and speech/language changes.  The patient and her son both report that difficulty started being noticed about 2 years ago and have progressively gotten worse.  The identifies changes in her speech pattern and word finding issues and that when she is talking she will end up talking in half sentences after forgetting what she was trying to say.  Memory changes are described as forgetting what is happened the day before and reports that cueing does help sometimes.  The patient reports that she is losing various objects around the house and has difficulty finding them.  The patient's son reports that the patient never knows where she has when she is out traveling and has shown progressive geographic disorientation.  Her son also reports that the patient is increasingly forgetting new information and new learning about recent experiences.  The patient's son reports that there are some days that are better than others.  Neither of them notice sundowning type pattern and that her functioning at night appears to be similar to the day.  No tremors or hallucinations either visual or auditory are noted.  The patient does report that sometimes when she goes to bed and wakes up at night that she will hear something but this is not over interpreted by the patient.  There is no family history except for the patient's mother  who was diagnosed with Alzheimer's.  Primary issues identified by the patient and her son include loss of memory, not able to sleep well, being unhappy and depressed, hearing noises at night, inability to keep up and pay bills and reduction in interest in things.  She is described as not knowing where  she is and forgetting things around the house and not like doing things that she used to love to do.  The patient had an MRI conducted on 06/26/2018.  The resulting impressions and interpretations include moderate periventricular and subcortical T2 changes bilateral.  This was felt to likely reflect the sequela of chronic microvascular ischemia and it was felt that these may be contributing to some of her chronic or ongoing memory loss.  No acute intracranial abnormalities were noted.  Behavioral Observation: MAKENLEE MCKEAG  presents as a 74 y.o.-year-old Right African American Female who appeared her stated age. her dress was Appropriate and she was Well Groomed and her manners were Appropriate to the situation.  her participation was indicative of Appropriate and Redirectable behaviors.  There were not any physical disabilities noted.  she displayed an appropriate level of cooperation and motivation.     Interactions:    Active Appropriate and Inattentive  Attention:   abnormal and attention span appeared shorter than expected for age  Memory:   abnormal; remote memory intact, recent memory impaired  Visuo-spatial:  not examined  Speech (Volume):  low  Speech:   normal; some word finding issues and difficulty with efficiency and sentence construction articulation noted.  Thought Process:  Coherent and Relevant  Though Content:  WNL; not suicidal and not homicidal  Orientation:   person, place, time/date and situation  Judgment:   Fair  Planning:   Poor  Affect:    Anxious  Mood:    Dysphoric  Insight:   Fair  Intelligence:   normal  Marital Status/Living: The patient was born and raised in [place] along with 3 brothers.  The patient continues to live with her husband of 37 years.  She has 2 adult children age 31 and 4 with no significant medical or psychiatric history.  Current Employment: The patient is retired.  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  Substance Use:  No concerns of substance abuse are reported.  Education:   The patient has her masters degree in education from [employer]  Medical History:   Past Medical History:  Diagnosis Date  . Bradycardia 06/2018  . Breast cancer ([place])   . Cancer [facility]) left    breast cancer 2005 surgery, chemo and radiation   . Complete heart block ([place])   . Diabetes mellitus   . Headache   . Hypercholesteremia   . Macular degeneration   . Osteoarthritis   . 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 Positive PPD    h/o, age 52  . Vitamin D deficiency   . Vulvar cancer ([place]) 2017 new dx       Psychiatric History:  No prior psychiatric history noted  Family Med/Psych History:  Family History  Problem Relation Age of Onset  . Cancer Maternal Grandmother   . Dementia Mother   . Other Father        possible Dementia    Impression/DX:  Conchita Truxillo is a 74 year old female that was referred by Sarina Ill , MD with [facility] for neuropsychological evaluation as part of work-up over concerns around progressively worsening  memory and other cognitive changes.  The patient was initially referred for neurological evaluation at the end of 2019 with follow-up neurological visit in March 2021.  The patient has a past medical history of hypertension, edema, type 2 diabetes, vulvar cancer, history of breast cancer, macular degeneration, osteoarthritis.  The patient and son both describe short-term memory loss, the development of geographic disorientation and increased word finding issues and speech/language changes.  The patient does acknowledge some progressive memory changes, geographic orientation changes and changes in expressive language.  No tremors, visual hallucination or sundowning type symptoms are noted.  Disposition/Plan:  We have set the patient up for formal neuropsychological evaluation with an initial primary  baseline battery of the Wechsler Adult Intelligence Scale-IV as well as the Wechsler Memory Scale-IV.  Once these are completed a determination was made of other measures are needed.  We will also likely conduct some expressive language measures as well.  Once these are completed a formal report will be written with recommendations and feedback for the patient and her family.  Diagnosis:    Memory loss  Cognitive and neurobehavioral dysfunction         Electronically Signed   _______________________ Ilean Skill, Psy.D.

## 2020-05-19 ENCOUNTER — Ambulatory Visit: Payer: Medicare PPO

## 2020-05-19 ENCOUNTER — Ambulatory Visit: Payer: Medicare PPO | Admitting: Radiation Oncology

## 2020-05-22 NOTE — Progress Notes (Addendum)
Radiation Oncology         [phone] ________________________________  Initial Outpatient Consultation  Name: Sabrina SARSFIELD MRN: [id number]  Date: 05/24/2020  DOB: 1945-11-13  VX:[id number], [name], MD  Hart Rochester, MD   REFERRING PHYSICIAN: Hart Rochester, MD  DIAGNOSIS: The encounter diagnosis was Vulvar cancer [facility]).  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 female who is seen as a courtesy of Dr. Polly Cobia for an opinion concerning radiation therapy as part of management for her recurrent vulvar cancer. Today, she is accompanied by son. The patient presented to [name], NP, in July of 2021 for evaluation of 6-8 month history of painful left labia "growth" in addition to intermittent vaginal bleeding and discharge. The patient underwent multiple vulvar biopsies on 04/11/2020. Results showed at least squamous cell carcinoma in-situ that was suspicious for invasion of the top vulva and squamous carcinoma of both the middle and bottom vulva.  Given the above results, the patient was referred to Dr. Polly Cobia and was seen in consultation on 04/20/2020. They discussed potential treatment options including radical resection of the new lesion vs treatment with definitive radiation if the lesion was felt to be locally advanced and not amenable for radical resection.  PET scan performed at [facility] on 05/02/2020 showed hypermetabolic vulvar skin thickening that was consistent with known malignancy. There were also noted to be bilateral hypermetabolic inguinal lymph nodes.   The patient underwent a pelvic examination under anesthesia with bilateral inguinal lymph node dissection. On exam under anesthesia, the patient was noted to have a large pedunculated mass that emanated from the left labia. It was fairly mobile and did not appear to be deeply invasive. However, further evaluation of the mass revealed  that there was disease crossing the perineal body and extended up to the right labia and into the vaginal introitus, along the area of prior surgical resection. It did not appear to extend to the urethral meatus but full visualization was somewhat difficult given the bulkiness of the tumor and the friability. It was not felt to be resectable based on the extent of disease and size of the lesion. Two right inguinal lymph nodes and three left inguinal lymph nodes were resected and and pathology from the surgery is pending at this time.  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 She subsequently underwent radical re-excision and sentinel node biopsies from bilateral groins in June of 2017 at [facility]. All results were negative at that time. No adjuvant treatment was recommended and close surveillance was warranted.  PREVIOUS RADIATION THERAPY: Yes, directed at the left breast area, details pending at this time  PAST MEDICAL HISTORY:  Past Medical History:  Diagnosis Date  . Bradycardia 06/2018  . Breast cancer ([place])   . Cancer [facility]) left    breast cancer 2005 surgery, chemo and radiation   . Complete heart block ([place])   . Diabetes mellitus   . Headache   . Hypercholesteremia   . Macular degeneration   . Osteoarthritis   . 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 Vitamin D deficiency   . Vulvar cancer ([place]) 2017 new dx    PAST SURGICAL HISTORY: Past Surgical History:  Procedure Laterality Date  . ABDOMINAL HYSTERECTOMY  complete  . BREAST SURGERY Left 2006   left breast lumpectomy , chemo and radiation done  . COLONOSCOPY WITH PROPOFOL N/A 07/24/2015   Procedure: COLONOSCOPY WITH PROPOFOL;  Surgeon: Garlan Fair, MD;  Location: [facility] ENDOSCOPY;  Service: Endoscopy;  Laterality: N/A;  . EYE SURGERY     removal of growth 35 years ago  .  HEMORROIDECTOMY     38 years ago, no present symtoms  . KNEE SURGERY Bilateral   . PACEMAKER IMPLANT N/A 07/16/2018   Procedure: PACEMAKER IMPLANT;  Surgeon: Evans Lance, MD;  Location: [facility] CV LAB;  Service: Cardiovascular;  Laterality: N/A;  . RADICAL VULVECTOMY N/A 02/01/2016   Procedure: RADICAL VULVECTOMY;  Surgeon: [name], MD;  Location: [facility] ORS;  Service: Gynecology;  Laterality: N/A;    FAMILY HISTORY:  Family History  Problem Relation Age of Onset  . Cancer Maternal Grandmother   . Dementia Mother   . Other Father        possible Dementia    SOCIAL HISTORY:  Social History   Tobacco Use  . Smoking status: Never Smoker  . Smokeless tobacco: Never Used  Vaping Use  . Vaping Use: Never used  Substance Use Topics  . Alcohol use: Never  . Drug use: Never    ALLERGIES:  Allergies  Allergen Reactions  . Duloxetine Itching    Cymbalta    MEDICATIONS:  Current Outpatient Medications  Medication Sig Dispense Refill  . Cyanocobalamin (VITAMIN B 12 PO) Take 1,000 mcg by mouth daily.    Marland Kitchen donepezil (ARICEPT) 10 MG tablet Take 10 mg by mouth at bedtime.    . ergocalciferol (VITAMIN D2) 50000 UNITS capsule Take 50,000 Units by mouth every Tuesday.     . flecainide (TAMBOCOR) 50 MG tablet Take 1 tablet (50 mg total) by mouth 2 (two) times daily. 180 tablet 3  . metoprolol tartrate (LOPRESSOR) 25 MG tablet Take 1 tablet (25 mg total) by mouth 2 (two) times daily. 180 tablet 2  . Omega-3 Fatty Acids (FISH OIL) 1200 MG CAPS Take 2 capsules by mouth every morning.    . sertraline (ZOLOFT) 100 MG tablet Take 1 tablet (100 mg total) by mouth daily. 90 tablet 3   No current facility-administered medications for this encounter.    REVIEW OF SYSTEMS:  A 10+ POINT REVIEW OF SYSTEMS WAS OBTAINED including neurology, dermatology, psychiatry, cardiac, respiratory, lymph, extremities, GI, GU, musculoskeletal, constitutional, reproductive, HEENT.  Patient is very  uncomfortable with her left vulvar lesion.  This interferes with sitting and ambulation.  She gets drainage and some bleeding from this area and vaginal region.   PHYSICAL EXAM:  height is 5' (1.524 m) and weight is 134 lb 8 oz (61 kg). Her oral temperature is 98.1 F (36.7 C). Her blood pressure is 139/68 and her pulse is 64. Her respiration is 18 and oxygen saturation is 100%.   General: Alert and oriented, in no acute distress HEENT: Head is normocephalic. Extraocular movements are intact.  Neck: Neck is supple, no palpable cervical or supraclavicular lymphadenopathy. Heart: Regular in rate and rhythm with no murmurs, rubs, or gallops. Chest: Clear to auscultation bilaterally, with no rhonchi, wheezes, or rales.  Pacemaker/defibrillator in place in the left upper chest. Examination of the right breast reveals no palpable mass nipple discharge or bleeding.  Examination of the left breast reveals some mild hyperpigmentation changes.  No dominant mass appreciated breast nipple discharge or bleeding.  Tattoos in place from the previous left  breast radiation therapy. Abdomen: Soft, nontender, nondistended, with no rigidity or guarding. Extremities: No cyanosis or edema. Lymphatics: see Neck Exam Skin: No concerning lesions. Musculoskeletal: symmetric strength and muscle tone throughout. Neurologic: Cranial nerves II through XII are grossly intact. No obvious focalities. Speech is fluent. Coordination is intact. Psychiatric: Judgment and insight are intact. Affect is appropriate. Pelvic exam bilateral JP drains in place.  No obvious signs of infection.  Patient was noted to have a large pedunculated mass along the left vulvar area estimated to be approximately 7 x 4 cm in size.  This lesion does appear to extend into the right vulvar region and possibly into the vaginal area although this is difficult to assess in light of the patient's discomfort with exam.  ECOG = 2  0 - Asymptomatic (Fully  active, able to carry on all predisease activities without restriction)  1 - Symptomatic but completely ambulatory (Restricted in physically strenuous activity but ambulatory and able to carry out work of a light or sedentary nature. For example, light housework, office work)  2 - Symptomatic, <50% in bed during the day (Ambulatory and capable of all self care but unable to carry out any work activities. Up and about more than 50% of waking hours)  3 - Symptomatic, >50% in bed, but not bedbound (Capable of only limited self-care, confined to bed or chair 50% or more of waking hours)  4 - Bedbound (Completely disabled. Cannot carry on any self-care. Totally confined to bed or chair)  5 - Death   Eustace Pen MM, Creech RH, Tormey DC, et al. [phone]). "Toxicity and response criteria of the [facility]". [place] Oncol. 5 (6): 649-55  LABORATORY DATA:  Lab Results  Component Value Date   WBC 11.0 (H) 07/15/2018   HGB 13.6 07/15/2018   HCT 43.5 07/15/2018   MCV 87.3 07/15/2018   PLT 237 07/15/2018   NEUTROABS 5.8 01/22/2016   Lab Results  Component Value Date   NA 139 09/01/2018   K 3.8 09/01/2018   CL 100 09/01/2018   CO2 24 09/01/2018   GLUCOSE 86 09/01/2018   CREATININE 0.76 09/01/2018   CALCIUM 9.8 09/01/2018      RADIOGRAPHY: No results found.    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  She was able to have removal of her bilateral  lymphadenopathy.  Patient would be a good candidate for definitive course of radiation therapy directed at the site of recurrence.  When she is healed in the inguinal region would also  recommend coverage of this region as well as the pelvic area given her extent of recurrence.  Given the bulk of the disease at the site of recurrence,  this will be  difficult to control with radiation therapy however treatments can help with patient's discomfort once the lesion shrinks and improve her quality of life.  Today, I talked to the patient and son about the findings and work-up thus far.  We discussed the natural history of vulvar cancer and general treatment, highlighting the role of radiotherapy in the management.  We discussed the available radiation techniques, and focused on the details of logistics and delivery.  We reviewed the anticipated acute and late sequelae associated with radiation in this setting.  The patient was encouraged to ask questions that I answered to  the best of my ability.  A patient consent form was discussed and signed.  We retained a copy for our records.  The patient would like to proceed with radiation and will be scheduled for CT simulation.  PLAN: The patient is scheduled to follow-up with Dr. Polly Cobia tomorrow, 05/25/2020.  Simulation possibly next week with treatments to begin soon as possible directed at the site of recurrence.  Once the inguinal areas have healed we will then proceed with more comprehensive radiation therapy.  In addition she will be seen by medical oncology to see if the patient would be a candidate for radiosensitizing chemotherapy.  Total time spent in this encounter was 65 minutes which included reviewing the patient's most recent consultations, follow-ups, PET scan, biopsies, pathology reports, physical examination, and documentation.    ------------------------------------------------  Blair Promise, PhD, MD  This document serves as a record of services personally performed by Gery Pray, MD. It was created on his behalf by Clerance Lav, a trained medical scribe. The creation of this record is based on the scribe's personal observations and the provider's statements to them. This document has been checked and approved by the attending provider.

## 2020-05-23 NOTE — Progress Notes (Signed)
  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 female who is seen as a courtesy of Dr. Polly Cobia for an opinion concerning radiation therapy as part of management for her recently diagnosed  Today, she is accompanied by  Her son.  The patient presented to [name], NP, in July of 2021 for evaluation of 6-8 month history of painful left labia "growth" in addition to intermittent vaginal bleeding and discharge. The patient underwent multiple vulvar biopsies on 04/11/2020. Results showed at least squamous cell carcinoma in-situ that was suspicious for invasion of the top vulva and squamous carcinoma of both the middle and bottom vulva.  Given the above results, the patient was referred to Dr. Polly Cobia and was seen in consultation on 04/20/2020. They discussed potential treatment options including radical resection of the new lesion vs treatment with definitive radiation if the lesion was felt to be locally advanced and not amenable for radical resection.  PET scan performed at [facility] on 05/02/2020 showed hypermetabolic vulvar skin thickening that was consistent with known malignancy. There were also noted to be bilateral hypermetabolic inguinal lymph nodes.   The patient underwent a pelvic examination under anesthesia with bilateral inguinal lymph node dissection. 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 She subsequently underwent radical re-excision and sentinel node biopsies from bilateral groins in June of 2017 at [facility]. All results were negative at that time. No adjuvant treatment was recommended and close surveillance was warranted.   Past/Anticipated interventions by medical oncology, if any: no  Weight changes, if any: no  Bowel/Bladder complaints, if any:  constipation Nausea/Vomiting, if any: no  Pain issues, if any:  Pelvic pain right side greater than right  SAFETY ISSUES:  Prior radiation? unsure  Pacemaker/ICD? yes  Possible current pregnancy? Hysterectomy  Is the patient on methotrexate? no  Current Complaints / other details: Son, [name] is with her   BP 139/68 (BP Location: Right Arm, Patient Position: Sitting)   Pulse 64   Temp 98.1 F (36.7 C) (Oral)   Resp 18   Ht 5' (1.524 m)   Wt 134 lb 8 oz (61 kg)   SpO2 100%   BMI 26.27 kg/m   Wt Readings from Last 3 Encounters:  05/24/20 134 lb 8 oz (61 kg)  05/15/20 135 lb (61.2 kg)  11/30/19 143 lb (64.9 kg)

## 2020-05-24 ENCOUNTER — Other Ambulatory Visit: Payer: Self-pay

## 2020-05-24 ENCOUNTER — Encounter: Payer: Self-pay | Admitting: General Practice

## 2020-05-24 ENCOUNTER — Ambulatory Visit
Admission: RE | Admit: 2020-05-24 | Discharge: 2020-05-24 | Disposition: A | Discharge status: Home / Self Care | Payer: Medicare PPO | Source: Ambulatory Visit | Attending: Radiation Oncology | Admitting: Radiation Oncology

## 2020-05-24 ENCOUNTER — Encounter: Payer: Self-pay | Admitting: Radiation Oncology

## 2020-05-24 VITALS — BP 139/68 | HR 64 | Temp 98.1°F | Resp 18 | Ht 60.0 in | Wt 134.5 lb

## 2020-05-24 DIAGNOSIS — C519 Malignant neoplasm of vulva, unspecified: Secondary | ICD-10-CM

## 2020-05-24 DIAGNOSIS — E119 Type 2 diabetes mellitus without complications: Secondary | ICD-10-CM | POA: Diagnosis not present

## 2020-05-24 DIAGNOSIS — E559 Vitamin D deficiency, unspecified: Secondary | ICD-10-CM | POA: Insufficient documentation

## 2020-05-24 DIAGNOSIS — E7800 Pure hypercholesterolemia, unspecified: Secondary | ICD-10-CM | POA: Diagnosis not present

## 2020-05-24 DIAGNOSIS — C518 Malignant neoplasm of overlapping sites of vulva: Secondary | ICD-10-CM | POA: Diagnosis not present

## 2020-05-24 DIAGNOSIS — Z803 Family history of malignant neoplasm of breast: Secondary | ICD-10-CM | POA: Insufficient documentation

## 2020-05-24 DIAGNOSIS — R000 Tachycardia, unspecified: Secondary | ICD-10-CM | POA: Diagnosis not present

## 2020-05-24 DIAGNOSIS — Z79899 Other long term (current) drug therapy: Secondary | ICD-10-CM | POA: Insufficient documentation

## 2020-05-24 DIAGNOSIS — M1990 Unspecified osteoarthritis, unspecified site: Secondary | ICD-10-CM | POA: Diagnosis not present

## 2020-05-24 DIAGNOSIS — Z9079 Acquired absence of other genital organ(s): Secondary | ICD-10-CM | POA: Diagnosis not present

## 2020-05-24 NOTE — Progress Notes (Signed)
[place] Psychosocial Distress Screening Clinical Social Work  Clinical Social Work was referred by distress screening protocol.  The patient scored a 5 on the Psychosocial Distress Thermometer which indicates moderate distress. Clinical Social Worker contacted patient by phone to assess for distress and other psychosocial needs.  Spoke w son [name] as patient has difficulty communicating.  "She understands what's going on, she is not thrilled about radiation and chemo, but she knows that is what it will take to get rid of it."  Has memory issues, currently having neuropsych evaluation to assess needs.  CSW and patient discussed common feeling and emotions when being diagnosed with cancer, and the importance of support during treatment. CSW informed patient of the support team and support services at [facility]. CSW provided contact information and encouraged patient to call with any questions or concerns.  ONCBCN DISTRESS SCREENING 05/24/2020  Screening Type Initial Screening  Distress experienced in past week (1-10) 5  Referral to clinical social work Yes  Other okay to call patient    Clinical Social Worker follow up needed: No.  If yes, follow up plan:  Beverely Pace, [place], [place] Social Worker Phone:  [phone] Cell:  [phone]

## 2020-05-24 NOTE — Progress Notes (Signed)
[facility] Psychosocial Distress Screening Clinical Social Work  Clinical Social Work was referred by distress screening protocol.  The patient scored a 5 on the Psychosocial Distress Thermometer which indicates moderate distress. Clinical Social Worker contacted patient by phone to assess for distress and other psychosocial needs. Patient unable to come to phone, spoke w son [name].  Patient is married, lives w husband, son is actively involved in her care.  She is currently undergoing neuropsych testing due to memory issues. All needs are met in the home.  Per son, she would like to complete Advance Directives.  CSW will mail packet and asked family to schedule an appointment to complete the document.  Family aware that they will need to get witnessed and notarized in the community due to current COVID restrictions.    ONCBCN DISTRESS SCREENING 05/24/2020  Screening Type Initial Screening  Distress experienced in past week (1-10) 5  Referral to clinical social work Yes  Other okay to call patient    Clinical Social Worker follow up needed: Yes.   await call from family re completion of Advance Directives  If yes, follow up plan:   C , LCSW    , LCSW Clinical Social Worker Phone:  [phone] Cell:  [phone] 

## 2020-05-25 ENCOUNTER — Encounter: Payer: Medicare PPO | Attending: Psychology | Admitting: Psychology

## 2020-05-25 ENCOUNTER — Encounter: Payer: Self-pay | Admitting: Psychology

## 2020-05-25 DIAGNOSIS — F0789 Other personality and behavioral disorders due to known physiological condition: Secondary | ICD-10-CM | POA: Diagnosis not present

## 2020-05-25 DIAGNOSIS — F09 Unspecified mental disorder due to known physiological condition: Secondary | ICD-10-CM | POA: Insufficient documentation

## 2020-05-25 DIAGNOSIS — C519 Malignant neoplasm of vulva, unspecified: Secondary | ICD-10-CM | POA: Diagnosis not present

## 2020-05-25 DIAGNOSIS — R413 Other amnesia: Secondary | ICD-10-CM | POA: Insufficient documentation

## 2020-05-25 NOTE — Progress Notes (Signed)
   Neuropsychology Note  [name] completed 240 minutes of neuropsychological testing with this provider. She was tearful and observed crying at various points during testing; she was highly self-critical. Rest breaks were offered but refused.   [name] will return for an interactive feedback session with Dr. Sima Matas on 07/20/20 at which time her test performances, clinical impressions and treatment recommendations will be reviewed in detail. The patient understands she can contact our office should she require our assistance before this time.  Full report to follow.

## 2020-05-26 ENCOUNTER — Other Ambulatory Visit: Payer: Self-pay

## 2020-05-26 ENCOUNTER — Encounter (HOSPITAL_BASED_OUTPATIENT_CLINIC_OR_DEPARTMENT_OTHER): Payer: Medicare PPO | Admitting: Psychology

## 2020-05-26 DIAGNOSIS — F0391 Unspecified dementia with behavioral disturbance: Secondary | ICD-10-CM

## 2020-05-26 DIAGNOSIS — R4183 Borderline intellectual functioning: Secondary | ICD-10-CM

## 2020-05-26 DIAGNOSIS — R413 Other amnesia: Secondary | ICD-10-CM | POA: Diagnosis not present

## 2020-05-26 DIAGNOSIS — F09 Unspecified mental disorder due to known physiological condition: Secondary | ICD-10-CM | POA: Diagnosis not present

## 2020-05-26 DIAGNOSIS — F0151 Vascular dementia with behavioral disturbance: Secondary | ICD-10-CM | POA: Diagnosis not present

## 2020-05-26 DIAGNOSIS — F0789 Other personality and behavioral disorders due to known physiological condition: Secondary | ICD-10-CM | POA: Diagnosis not present

## 2020-05-30 ENCOUNTER — Ambulatory Visit
Admission: RE | Admit: 2020-05-30 | Discharge: 2020-05-30 | Disposition: A | Discharge status: Home / Self Care | Payer: Self-pay | Source: Ambulatory Visit | Attending: Hematology and Oncology | Admitting: Hematology and Oncology

## 2020-05-30 ENCOUNTER — Encounter: Payer: Self-pay | Admitting: Oncology

## 2020-05-30 DIAGNOSIS — C519 Malignant neoplasm of vulva, unspecified: Secondary | ICD-10-CM

## 2020-05-30 NOTE — Progress Notes (Signed)
Called [name] and spoke to her son [name].  Scheduled new patient appointment with Dr. Alvy Bimler for 06/06/20 at 2:00 with 1:30 arrival.  Also advised that she should bring one family member.  He verbalized understanding and agreement.

## 2020-05-30 NOTE — Progress Notes (Signed)
Requested powershare of PET scan from 05/03/20 done at [facility] via fax with [facility].

## 2020-06-05 ENCOUNTER — Other Ambulatory Visit: Payer: Self-pay | Admitting: Oncology

## 2020-06-05 NOTE — Progress Notes (Signed)
Gynecologic Oncology Multi-Disciplinary Disposition Conference Note  Date of the Conference: 06/05/2020  Patient Name: [name]  Referring Provider: Dr. Polly Cobia Primary GYN Oncologist: Dr. 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 Disposition is for repeat biopsy of the primary site.   This Multidisciplinary conference took place involving physicians from [place], [place], Radiation Oncology, Pathology, Radiology along with the Gynecologic Oncology Nurse Practitioner and RN.  Comprehensive assessment of the patient's malignancy, staging, need for surgery, chemotherapy, radiation therapy, and need for further testing were reviewed. Supportive measures, both inpatient and following discharge were also discussed. The recommended plan of care is documented. Greater than 35 minutes were spent correlating and coordinating this patient's care.

## 2020-06-06 ENCOUNTER — Encounter: Payer: Self-pay | Admitting: Hematology and Oncology

## 2020-06-06 ENCOUNTER — Ambulatory Visit
Admission: RE | Admit: 2020-06-06 | Discharge: 2020-06-06 | Disposition: A | Discharge status: Home / Self Care | Payer: Medicare PPO | Source: Ambulatory Visit | Attending: Radiation Oncology | Admitting: Radiation Oncology

## 2020-06-06 ENCOUNTER — Other Ambulatory Visit: Payer: Self-pay

## 2020-06-06 ENCOUNTER — Encounter: Payer: Self-pay | Admitting: Psychology

## 2020-06-06 ENCOUNTER — Inpatient Hospital Stay: Payer: Medicare PPO | Attending: Hematology and Oncology | Admitting: Hematology and Oncology

## 2020-06-06 DIAGNOSIS — E119 Type 2 diabetes mellitus without complications: Secondary | ICD-10-CM | POA: Insufficient documentation

## 2020-06-06 DIAGNOSIS — C518 Malignant neoplasm of overlapping sites of vulva: Secondary | ICD-10-CM | POA: Diagnosis not present

## 2020-06-06 DIAGNOSIS — R634 Abnormal weight loss: Secondary | ICD-10-CM

## 2020-06-06 DIAGNOSIS — Z79899 Other long term (current) drug therapy: Secondary | ICD-10-CM | POA: Insufficient documentation

## 2020-06-06 DIAGNOSIS — Z510 Encounter for antineoplastic radiation therapy: Secondary | ICD-10-CM | POA: Insufficient documentation

## 2020-06-06 DIAGNOSIS — I442 Atrioventricular block, complete: Secondary | ICD-10-CM | POA: Diagnosis not present

## 2020-06-06 DIAGNOSIS — Z923 Personal history of irradiation: Secondary | ICD-10-CM | POA: Diagnosis not present

## 2020-06-06 DIAGNOSIS — Z853 Personal history of malignant neoplasm of breast: Secondary | ICD-10-CM | POA: Diagnosis not present

## 2020-06-06 DIAGNOSIS — Z9221 Personal history of antineoplastic chemotherapy: Secondary | ICD-10-CM

## 2020-06-06 DIAGNOSIS — R413 Other amnesia: Secondary | ICD-10-CM | POA: Diagnosis not present

## 2020-06-06 DIAGNOSIS — C519 Malignant neoplasm of vulva, unspecified: Secondary | ICD-10-CM | POA: Insufficient documentation

## 2020-06-06 DIAGNOSIS — Z5111 Encounter for antineoplastic chemotherapy: Secondary | ICD-10-CM | POA: Diagnosis not present

## 2020-06-06 NOTE — Progress Notes (Signed)
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[facility]) I have personally reviewed her imaging studies Her case was recently discussed at the tumor board Based on PET CT scan and clinical findings, the patient have locally invasive tumor However, her biopsy from July was suspicious for invasion but not conclusive She underwent recent bilateral lymphadenectomy but results were benign At this point in time, it is difficult to get insurance approval for concurrent chemoradiation therapy without definitive invasive cancer seen on tissue histology Also, if we were to start concurrent chemoradiation therapy and if she have residual disease in the future, repeat biopsy may came back necrotic tissue only and we might not be able to get immunotherapy approved in the future I think it is important to get all the information now, upfront, before we proceed with definitive treatment Repeating biopsy right now we will change future management and I think that is important The risk and benefits of repeat biopsy is fully discussed with her son and he is in agreement I will attempt to call her GYN oncologist to discuss this She will proceed with treatment planning with radiation department as scheduled I will see her back once biopsy is performed If invasive cancer is seen, we will proceed with concurrent chemoradiation therapy with cisplatin as chemo sensitizing agent  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 She has been referred to see neuropsychologist for special testing Based on my interaction with patients, she appears to have poor short-term memory According to her son, she is still competent to make medical decision but limited capacity  Weight loss, abnormal She has profound abnormal weight loss We discussed the importance of frequent small meals I will refer her to see dietitian in the  future  NEOPLASM, MALIGNANT, BREAST, HX OF She has remote history of breast cancer Recent PET CT scan is negative Observe for now  Complete heart block [facility]) She follows with cardiologist closely There is no contraindication for her to receive chemotherapy in the near future She might benefit from aspirin therapy to prevent risk of stroke but given her ongoing follow-up bleeding, we will continue to hold aspirin therapy   No orders of the defined types were placed in this encounter.   The total time spent in the appointment was 60 minutes encounter with patients including review of chart and various tests results, discussions about plan of care and coordination of care plan   All questions were answered. The patient knows to call the clinic with any problems, questions or concerns. No barriers to learning was detected.  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 Denman George in May 2017, with close margins and underwent repeat resection a month later at [facility], she was lost to follow-up When she complained of recurrent vulvar mass, she was referred to local gynecologist and biopsy showed squamous carcinoma in situ,  suspicious for invasion but not definitive She underwent outpatient PET CT scan at [place] which showed bilateral lymphadenopathy suspicious for malignancy She underwent surgery but outcomes of her pathology  came back benign She was referred to see radiation oncologist for radiation therapy I was consulted for concurrent chemotherapy  I have reviewed her chart and materials related to her cancer extensively and collaborated history with the patient. Summary of oncologic history is as follows: Oncology History  Vulvar cancer ([place])  02/01/2016 Surgery   Preop Diagnosis: Vulvar mass, 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 Freddi Che), multiple vulvar re-excision  Surgeon: Margaretmary Bayley, MD; Andi Devon MD  Findings: Bilateral sentinel LN identified, both hot and blue. See Dr. Bethel Born note for full details. Posterior vulva with evidence of partial wound breakdown without evidence of infection. Granulation tissue noted with exposed subcutaneous sutures. 2x2cm concerning nodular plaque at 7 o'clock, medial margin <1cm from anus. Rectal exam at conclusion normal.   Specimens: 1. Left vulvar margin 2. Right vulvar margin 3. Left sentinel inguinal LN 4. Right sentinel inguinal LN 5. Vulvar lesion at 7 oclock, stitch at 12 oclock    03/04/2016 Pathology Results   A: Lymph node, right inguinal sentinel #2, removal - 1 lymph node negative for metastatic carcinoma (0/1). - Pan cytokeratin confirmatory (negative).   B: Lymph node, right inguinal sentinel #1, removal  - 2 lymph nodes, negative for metastatic carcinoma (0/2). - Pan cytokeratin confirmatory (negative).   C: Lymph node, left inguinal sentinel  #1, removal  - 1 lymph node negative for metastatic carcinoma (0/1). - Pan cytokeratin confirmatory (negative).   D: Vulva, left margin, biopsy  - Ulcerated squamous tissue and granulation tissue. - Acute inflammation and suture granuloma. - No dysplasia or malignancy identified.   E: Vulva, right margin, biopsy  - Skin and subcutaneous tissue with fat necrosis, ulceration, fibrosis and granulation tissue. - No dysplasia or malignancy identified.   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 - Margins for invasive carcinoma are negative (deep 4 mm, 3 o'clock half 5 mm and 9 o'clock half 7 mm) - Margins for HSIL:  3-o'clock half positive for VIN 2. - Other findings: Fat necrosis, fibrosis and chronic inflammation   04/11/2020 Pathology Results   1. Vulva, biopsy, top - AT LEAST SQUAMOUS CELL CARCINOMA IN SITU, SUSPICIOUS FOR INVASION. SEE NOTE 2. Vulva, biopsy, middle - SQUAMOUS CARCINOMA IN SITU. SEE NOTE 3. Vulva, biopsy, bottom - SQUAMOUS CARCINOMA IN SITU. SEE NOTE   05/03/2020 PET scan   Hypermetabolic bilateral vulvar skin thickening max SUV 19, consistent with patient's known malignancy.   Hypermetabolic bilateral inguinal lymph nodes.  Right inguinal node measuring 16 x 25 mm max SUV 6.9  Left inguinal lymph node measuring 12 x 15 mm  max SUV 5.3   No other worrisome hypermetabolic activity identified.   Additional findings:  Lobulated bilateral renal cortex suggesting scarring.  Postop change with the left breast.   05/16/2020 Surgery   Surgeon(s) and Role: * Hart Rochester, MD - Primary  Specimens:  ID Type Source Tests Collected by Time  1 : RIGHT INGUINAL Tissue Lymph Node PATHOLOGY  TISSUE REQUEST Hart Rochester, MD 05/16/2020 [phone]  2 : LEFT INGUINAL Tissue Lymph Node PATHOLOGY TISSUE REQUEST Hart Rochester, MD 05/16/2020 [phone]    Findings:  Bilateral inguinal nodes c/w metastatic disease. On exam under anesthesia, the patient was noted to have a large pedunculated mass emanating from the left labia. This was fairly mobile and did not appear to be deeply invasive. Further evaluation of this pedunculated mass revealed that there was disease crossing the perineal body extending up to the right labia and into the vaginal introitus. This was along the area of prior surgical resection. This did not appear to extend to the urethral meatus but full visualization of this was somewhat difficult given the bulkiness of the tumor and the friability. It was not felt that this was resectable based on the extent of disease and size of the lesion.    05/16/2020 Pathology Results   1.  Right inguinal lymph node, resection:      -Two lymph nodes, negative for carcinoma (0/2). 2.  Left inguinal lymph node, resection:      -Three lymph nodes, negative for carcinoma (0/3).   06/06/2020 Cancer Staging   Staging form: Vulva, AJCC 8th Edition - Clinical stage from 06/06/2020: Stage Unknown (rcTX, cN0, cM0) - Signed by Heath Lark, MD on 06/06/2020    According to her son, she has poor appetite.  Based on review on chart records, the patient has lost 30 pounds in under a year She denies nausea No recent constipation or diarrhea She lives at home with her husband with ability to care for herself but dependent on her son for meals and transportation and doctor's visits She takes small amount of pain medicine intermittently since surgery for pain  MEDICAL HISTORY:  Past Medical History:  Diagnosis Date  . Bradycardia 06/2018  . Breast cancer ([place])   . Cancer [facility]) left    breast cancer 2005 surgery, chemo and radiation   . Complete heart block ([place])   . Diabetes mellitus   . Headache   .  Hypercholesteremia   . Macular degeneration   . Osteoarthritis   . 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 Vitamin D deficiency   . Vulvar cancer ([place]) 2017 new dx    SURGICAL HISTORY: Past Surgical History:  Procedure Laterality Date  . ABDOMINAL HYSTERECTOMY     complete  . BREAST SURGERY Left 2006   left breast lumpectomy , chemo and radiation done  . COLONOSCOPY WITH PROPOFOL N/A 07/24/2015   Procedure: COLONOSCOPY WITH PROPOFOL;  Surgeon: Garlan Fair, MD;  Location: [facility] ENDOSCOPY;  Service: Endoscopy;  Laterality: N/A;  . EYE SURGERY     removal of growth 35 years ago  . HEMORROIDECTOMY     38 years ago, no present symtoms  . KNEE SURGERY Bilateral   . PACEMAKER IMPLANT N/A 07/16/2018   Procedure: PACEMAKER IMPLANT;  Surgeon: Evans Lance, MD;  Location: [place] [facility];  Service: Cardiovascular;  Laterality: N/A;  . RADICAL VULVECTOMY N/A 02/01/2016   Procedure: RADICAL VULVECTOMY;  Surgeon: Terrence Dupont  Denman George, MD;  Location: [facility] ORS;  Service: Gynecology;  Laterality: N/A;    SOCIAL HISTORY: Social History   Socioeconomic History  . Marital status: Married    Spouse name: Not on file  . Number of children: 2  . Years of education: Not on file  . Highest education level: Master's degree (e.g., MA, MS, MEng, MEd, MSW, MBA)  Occupational History  . Not on file  Tobacco Use  . Smoking status: Never Smoker  . Smokeless tobacco: Never Used  Vaping Use  . Vaping Use: Never used  Substance and Sexual Activity  . Alcohol use: Never  . Drug use: Never  . Sexual activity: Yes  Other Topics Concern  . Not on file  Social History Narrative   Lives at home with husband   Right handed   Caffeine: never   Social Determinants of Health   Financial Resource Strain: Low Risk   . Difficulty of Paying Living Expenses: Not hard at all  Food Insecurity: No Food Insecurity  . Worried About Charity fundraiser in the Last Year: Never true   . Ran Out of Food in the Last Year: Never true  Transportation Needs: No Transportation Needs  . Lack of Transportation (Medical): No  . Lack of Transportation (Non-Medical): No  Physical Activity:   . Days of Exercise per Week: Not on file  . Minutes of Exercise per Session: Not on file  Stress:   . Feeling of Stress : Not on file  Social Connections: Moderately Isolated  . Frequency of Communication with Friends and Family: More than three times a week  . Frequency of Social Gatherings with Friends and Family: More than three times a week  . Attends Religious Services: Never  . Active Member of Clubs or Organizations: No  . Attends Archivist Meetings: Never  . Marital Status: Married  Human resources officer Violence:   . Fear of Current or Ex-Partner: Not on file  . Emotionally Abused: Not on file  . Physically Abused: Not on file  . Sexually Abused: Not on file    FAMILY HISTORY: Family History  Problem Relation Age of Onset  . Cancer Maternal Grandmother   . Dementia Mother   . Other Father        possible Dementia    ALLERGIES:  is allergic to duloxetine.  MEDICATIONS:  Current Outpatient Medications  Medication Sig Dispense Refill  . HYDROcodone-acetaminophen (NORCO/VICODIN) 5-325 MG tablet Take 1 tablet by mouth every 6 (six) hours as needed.    . Cyanocobalamin (VITAMIN B 12 PO) Take 1,000 mcg by mouth daily.    Marland Kitchen donepezil (ARICEPT) 10 MG tablet Take 10 mg by mouth at bedtime.    . ergocalciferol (VITAMIN D2) 50000 UNITS capsule Take 50,000 Units by mouth every Tuesday.     . flecainide (TAMBOCOR) 50 MG tablet Take 1 tablet (50 mg total) by mouth 2 (two) times daily. 180 tablet 3  . metoprolol tartrate (LOPRESSOR) 25 MG tablet Take 1 tablet (25 mg total) by mouth 2 (two) times daily. 180 tablet 2  . Omega-3 Fatty Acids (FISH OIL) 1200 MG CAPS Take 2 capsules by mouth every morning.    . sertraline (ZOLOFT) 100 MG tablet Take 1 tablet (100 mg total) by mouth  daily. 90 tablet 3   No current facility-administered medications for this visit.    REVIEW OF SYSTEMS:   Constitutional: Denies fevers, chills or abnormal night sweats Eyes: Denies blurriness of vision, double vision  or watery eyes Ears, nose, mouth, throat, and face: Denies mucositis or sore throat Respiratory: Denies cough, dyspnea or wheezes Cardiovascular: Denies palpitation, chest discomfort or lower extremity swelling Gastrointestinal:  Denies nausea, heartburn or change in bowel habits Skin: Denies abnormal skin rashes Lymphatics: Denies new lymphadenopathy or easy bruising Neurological:Denies numbness, tingling or new weaknesses Behavioral/Psych: Mood is stable, no new changes  All other systems were reviewed with the patient and are negative.  PHYSICAL EXAMINATION: ECOG PERFORMANCE STATUS: 1 - Symptomatic but completely ambulatory  Vitals:   06/06/20 1414  BP: 126/73  Pulse: 92  Resp: 18  Temp: 99.2 F (37.3 C)  SpO2: 100%   Filed Weights   06/06/20 1414  Weight: 132 lb 12.8 oz (60.2 kg)    GENERAL:alert, no distress and comfortable.  Somewhat foul-smelling discharge is noted SKIN: skin color, texture, turgor are normal, no rashes or significant lesions EYES: normal, conjunctiva are pink and non-injected, sclera clear OROPHARYNX:no exudate, no erythema and lips, buccal mucosa, and tongue normal  NECK: supple, thyroid normal size, non-tender, without nodularity LYMPH:  no palpable lymphadenopathy in the cervical, axillary or inguinal LUNGS: clear to auscultation and percussion with normal breathing effort HEART: regular rate & rhythm and no murmurs and no lower extremity edema ABDOMEN:abdomen soft, non-tender and normal bowel sounds Musculoskeletal:no cyanosis of digits and no clubbing  PSYCH: alert & oriented x 3 with fluent speech, she appears to have poor memory NEURO: no focal motor/sensory deficits Due to the patient's late for appointment and limited time  available until her next appointment, I did not perform pelvic exam  LABORATORY DATA:  I have reviewed the data as listed Lab Results  Component Value Date   WBC 11.0 (H) 07/15/2018   HGB 13.6 07/15/2018   HCT 43.5 07/15/2018   MCV 87.3 07/15/2018   PLT 237 07/15/2018   No results for input(s): NA, K, CL, CO2, GLUCOSE, BUN, CREATININE, CALCIUM, GFRNONAA, GFRAA, PROT, ALBUMIN, AST, ALT, ALKPHOS, BILITOT, BILIDIR, IBILI in the last 8760 hours.  RADIOGRAPHIC STUDIES: I have reviewed outpatient PET CT scan I have personally reviewed the radiological images as listed and agreed with the findings in the report.

## 2020-06-06 NOTE — Assessment & Plan Note (Signed)
She follows with cardiologist closely There is no contraindication for her to receive chemotherapy in the near future She might benefit from aspirin therapy to prevent risk of stroke but given her ongoing follow-up bleeding, we will continue to hold aspirin therapy

## 2020-06-06 NOTE — Progress Notes (Addendum)
NEUROPSYCHOLOGICAL EVALUATION   Name:    [name]  Date of Birth:   03-19-1946 Date of Interview:  05/04/2020 Date of Testing:  05/25/2020 Date of Report:  05/26/2020   Date of Feedback:  07/20/2020     Background Information:  Reason for Referral:  [name] is a 74 y.o. female referred by Sarina Ill, MD with [facility] to assess her current level of cognitive functioning and assist in differential diagnosis. The current evaluation consisted of a review of available medical records, an interview with the patient and son, and the completion of a neuropsychological testing battery. Informed consent was obtained.  History of Presenting Problem:  [name] is a 74 year old female that was referred by Sarina Ill , MD with [facility] for neuropsychological evaluation as part of work-up over concerns around progressively worsening memory and other cognitive changes.  The patient was initially referred for neurological evaluation at the end of 2019 with follow-up neurological visit in March 2021.  The patient has a past medical history of hypertension, edema, type 2 diabetes, vulvar cancer, history of breast cancer, macular degeneration, osteoarthritis.  The patient and son both describe short-term memory loss, the development of geographic disorientation and increased word finding issues and speech/language changes.  The patient and her son both report that difficulty started being noticed about 2 years ago and have progressively gotten worse.  The identifies changes in her speech pattern and word finding issues and that when she is talking she will end up talking in half sentences after forgetting what she was trying to say.  Memory changes are described as forgetting what is happened the day before and reports that cueing does help sometimes.  The patient reports that she is losing various objects around the house and has difficulty  finding them.  The patient's son reports that the patient never knows where she has when she is out traveling and has shown progressive geographic disorientation.  Her son also reports that the patient is increasingly forgetting new information and new learning about recent experiences.  The patient's son reports that there are some days that are better than others.  Neither of them notice sundowning type pattern and that her functioning at night appears to be similar to the day.  No tremors or hallucinations either visual or auditory are noted.  The patient does report that sometimes when she goes to bed and wakes up at night that she will hear something but this is not over interpreted by the patient.  There is no family history except for the patient's mother who was diagnosed with Alzheimer's.  Primary issues identified by the patient and her son include loss of memory, not able to sleep well, being unhappy and depressed, hearing noises at night, inability to keep up and pay bills and reduction in interest in things.  She is described as not knowing where she is and forgetting things around the house and not like doing things that she used to love to do.  The patient had an MRI conducted on 06/26/2018.  The resulting impressions and interpretations include moderate periventricular and subcortical T2 changes bilateral.  This was felt to likely reflect the sequela of chronic microvascular ischemia and it was felt that these may be contributing to some of her chronic or ongoing memory loss.  No acute intracranial abnormalities were noted.  Medical History:  Past Medical History:  Diagnosis Date  . Bradycardia 06/2018  . Breast cancer (  [place])   . Cancer ([place]) left    breast cancer 2005 surgery, chemo and radiation   . Complete heart block ([place])   . Diabetes mellitus   . Headache   . Hypercholesteremia   . Macular degeneration   . Osteoarthritis   . 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 Vitamin D deficiency   . Vulvar cancer ([place]) 2017 new dx   Current medications:  Outpatient Encounter Medications as of 05/26/2020  Medication Sig  . Cyanocobalamin (VITAMIN B 12 PO) Take 1,000 mcg by mouth daily.  Marland Kitchen donepezil (ARICEPT) 10 MG tablet Take 10 mg by mouth at bedtime.  . ergocalciferol (VITAMIN D2) 50000 UNITS capsule Take 50,000 Units by mouth every Tuesday.   . flecainide (TAMBOCOR) 50 MG tablet Take 1 tablet (50 mg total) by mouth 2 (two) times daily.  . metoprolol tartrate (LOPRESSOR) 25 MG tablet Take 1 tablet (25 mg total) by mouth 2 (two) times daily.  . Omega-3 Fatty Acids (FISH OIL) 1200 MG CAPS Take 2 capsules by mouth every morning.  . sertraline (ZOLOFT) 100 MG tablet Take 1 tablet (100 mg total) by mouth daily.   No facility-administered encounter medications on file as of 05/26/2020.    Current Examination:  Behavioral Observations: Sherrill Raring completed 240 minutes of neuropsychological testing with this provider. She was tearful and observed crying at various points during testing; she was highly self-critical. . Though process was highly tangential and notable for confusion and perseveration. She reportedly did not "know why I am here". She described having low energy and appeared tired at times. Rest breaks were offered but refused. Despite feeling "sleepy", she persisted well overall and completed all assigned tasks with adequate effort.   Orientation: Partially oriented. Gave incorrect year and day of the week. Not able to accurately named the current President of the [employer].   Tests Administered: . Animal Naming  . [place] (BNT) . Controlled Oral Word Association (COWA) . Wechsler Adult Intelligence Scale, 4th Edition WAIS-IV  . Wechsler Memory Scale, 4th Edition WMS-IV (Older Adult Battery)  Test Results: Note: Standardized scores are presented only for use by appropriately trained professionals and to allow for any  future test-retest comparison. These scores should not be interpreted without consideration of all the information that is contained in the rest of the report. The most recent standardization samples from the test publisher or other sources were used whenever possible to derive standard scores; scores were corrected for age, gender, ethnicity and education when available.   Test Scores:  Animals  ss=2  <1 COWA (FAS)  ss=8,  25%  BNT  T=18, <1%  Formal assessment of language functioning revealed a severely impaired score on a measure of confrontation naming (BNT<1st percentile). She scored in the average range on a letter-based word generation task (FAS = 25th percentile) but in the severly impaired range on a semantic category-based word generation task (Animals<1st percentile).  Composite Score Summary  Scale Sum of Scaled Scores Composite Score Percentile Rank 95% Conf. Interval Qualitative Description  Verbal Comprehension 18 VCI 78 7 73-85 Borderline  Perceptual Reasoning 23 PRI 86 18 80-93 Low Average  Working Memory 12 WMI 77 6 72-85 Borderline  Processing Speed 9 PSI 71 3 66-82 Borderline  Full Scale 62 FSIQ 74 4 70-79 Borderline  General Ability 41 GAI 80 9 76-86 Low Average   Working Memory Process Score Summary  Process Score Raw Score  Scaled Score Percentile Rank Base Rate SEM  Digit Span Forward 9 9 37 -- 1.31  Digit Span Backward _0 -- 1.34  Digit Span Sequencing _1 -- 1.12  Longest Digit Span Forward 6 -- -- 69.0 --  Longest Digit Span Backward 3 -- -- 95.0 --  Longest Digit Span Sequence 3 -- -- 92.0 --   Index Score Summary  Index Sum of Scaled Scores Index Score Percentile Rank 95% Confidence Interval Qualitative Descriptor  Auditory Memory (AMI) 14 60 0.4 56-68 Extremely Low  Visual Memory (VMI) 7 62 1 58-68 Extremely Low  Immediate Memory (IMI) 9 56 0.2 52-64 Extremely Low  Delayed Memory (DMI) 12 63 1 58-74 Extremely Low   Primary Subtest Scaled  Score Summary  Subtest Domain Raw Score Scaled Score Percentile Rank  Logical Memory I AM _2 Logical Memory II AM _3 Verbal Paired Associates I AM _4 Verbal Paired Associates II AM _5 Visual Reproduction I VM 10 2 0.4  Visual Reproduction II VM _6 Symbol Span VWM _7 Auditory Memory Process Score Summary  Process Score Raw Score Scaled Score Percentile Rank Cumulative Percentage (Base Rate)  LM II Recognition 15 - - 17-25%  VPA II Recognition 15 - - 3-9%   Visual Memory Process Score Summary  Process Score Raw Score Scaled Score Percentile Rank Cumulative Percentage (Base Rate)  VR II Recognition 3 - - 26-50%   Predicted Difference Method   Index Predicted WMS-IV Index Score Actual WMS-IV Index Score Difference Critical Value  Significant Difference Y/N Base Rate  Auditory Memory 89 60 29 9.16 Y 1-2%  Visual Memory 88 62 26 8.46 Y 2%  Immediate Memory 87 56 31 10.38 Y <1%  Delayed Memory 88 63 25 12.01 Y 2-3%  Statistical significance (critical value) at the .01 level.   Description of Test Results:  Premorbid verbal intellectual abilities were estimated to have been at least average based on educational and occupational history. Vocabulary ability, which can also be used to estimate premorbid intellectual ability was also average. Current intellectual ability was mildly impaired for her age. Verbal comprehension was mildly impaired overall whereas perceptual reasoning was below average. Information processing speed and working memory were both mildly impaired. Visual-spatial construction was average. Language abilities were mixed but mostly impaired. Specifically, confrontation naming and semantic verbal fluency were severely impaired. Immediate and delayed memory for both auditory and visual information was severely impaired. Recognition memory was mostly intact.   Clinical Impressions: Ms. Medders is a 74 year old, right-handed, African American,  female with 16 years of education who is currently functioning in the mildly impaired to below average range of general intellectual abilities (FSIQ=74, 4th percentile; GAI=80, 9th percentile) with verbal abilities in the borderline impaired range and below average perceptual reasoning skills (VCI =78, 7th percentile; PRI = 86, 18th percentile).  She scored in the average range on measures considered to be good estimates of premorbid intellectual functioning (i.e., Vocabulary subtest).  These scores, when considered with her educational and occupational history, suggest a significant decline in intellectual abilities.  Results of the current cognitive evaluation reveal several areas of impairment, including visual and auditory working memory, information processing speed, verbal reasoning and general fund of knowledge, semantic retrieval (i.e., semantic fluency and confrontation naming), and verbal and non-verbal memory. In contrast, vocabulary knowledge, visual perception and visuospatial construction were average. She was also  able to recognize verbal and nonverbal details from initial presentation and learning trials of such tests. There is evidence that her cognitive deficits are interfering with her ability to manage complex tasks, such as managing finances and cooking. As such, diagnostic criteria for a dementia syndrome are met. 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 The fact that most areas of relative decline relate to overall information processing speed and working memory provide some support for a prominent impact from chronic white matter disease and other ischemic changes. However, given her relative decline in aspects of verbal and general fund of  knowledge, semantic retrieval, as well as general memory decline, a cortical dementia such as alzheimer's disease cannot be ruled out at this time, especially given family history (e.g., mother was reportedly diagnosed with AD). This is generally consistent with her son's recent report that he believes her memory and adaptive problems have progressively worsened over the last two years.   Recommendations: Based on the findings of the present evaluation, the following recommendations are offered:  1. Consider supplementation with memantine, an NMDA antagonist, after the cholinesterase inhibitor is recorded as well tolerated.  2. Given report of visual hallucinations and delusions, could consider an off label trial of Nuplazid.  - While the U.S. Food and Drug Administration (FDA) has not approved any drugs specifically to treat symptoms of vascular induced cognitive deficits, there is evidence from clinical trials that drugs approved to treat Alzheimer's symptoms (i.e., cholinesterase inhibitors and memantine) may offer modest benefit. Whether this is initiated is best left determined by the patient's care providers.   3. If agitation worsens, or begins to significantly affect daily life, then consider Nuedexta.   - Of course, there is a cost-benefit analysis that must be considered carefully and it may not be possible to treat all of the patient's symptoms without some cognitive side effects.  4. Consider ordering labs to assess for other possible etiologies of cognitive disruption (e.g., TSH, vitamin B-12, methylmalonic acid, homocysteine, RPR, folate, vitamin D).   5. Considering the noted medical history, the patient is at increased risk for progression of cognitive impairment. Therefore, it is recommended that the patient aggressively manage any modifiable risk factors for further cognitive decline such as strict compliance with prescribed medical treatments for any cerebrovascular risk factors  (e.g., high cholesterol, high blood pressure, sleep apnea, diabetes).  6. Due to the nature and severity of the symptoms noted during this evaluation, it is recommended that the patient remain under 24-hour care and supervision, as the cognitive deficits noted represent a safety risk if left alone for extended periods of time.  As long as a spouse or family member that can stay with the patient remains healthy, no changes are needed in living situation.   6. The patient should continue to receive assistance with complex ADLs, including finances, cooking and transportation. Her son should monitor her medications to make sure they are being taken correctly.  7. Given her son's report of episodes of poor judgment resulting in possible safety issues, there may be benefit from a home safety evaluation with someone such as an occupational therapist.  8. It is recommended that the severity of the patient's impairments is considered when assessing the level of asset management required. For example, given impairment higher-level reasoning and problem-solving skills, it is recommended that  the patient consult with a family member or another trusted advisor prior to making any important medical, legal, or financial decisions. Establishing or continuing to rely upon someone who has Power of Musician and medical decision-making is recommended.    9. The patient should limit or refrain from driving, as deficits noted on testing could affect one's ability to safely operate a motor vehicle.  At minimum, it is recommended that this person undergo a formal driving evaluation. Could Wellsite geologist at: [phone].    10. It may be beneficial to contact the [employer] [facility] in [place] to find alternative methods of transportation and identify other services that may be beneficial for the patient now or in the future. They can be reached at [phone]  [phone].  11. Regular medical care is important for an individual with dementia. Therefore, make sure to maintain regular appointments with all medical providers. In addition, schedule these appointments during the patient's best time of day.  12. The patient should wear identification at all times, in case the individual becomes lost and disoriented.   13. Try to keep in mind that common word finding errors are not necessarily the start of a dreadful decline. Over-focusing on these errors can contribute to further distraction and emotions that can detract from effective retrieval of words; this in turn, can lead to greater distress and more difficulties with recalling the specific word you were looking for to begin with.   71. Education regarding Vascular disease will be provided to the patient and her son. The patient's family may wish to attend a local dementia caregiver support group and/or seek additional information from the Alzheimer's Association ([employer]). They may wish to seek additional resources through [employer] (contact information was provided).  15. The patient should continue to participate in activities which provide mental stimulation, safe cardiovascular exercise, and social interaction.  16. Neuropsychological re-assessment in one year is recommended in order to monitor cognitive status, track progression of symptoms and further assist with treatment planning.  The following are several strategies that may help:  . Performance will generally be best in a structured, routine, and familiar environment, as opposed to situations involving complex problems.  Marlene Lard a place to keep your keys, wallet, cell phone, and other personal belongings. . Take time to register and process information to be remembered. Deeper encoding of information can be gained by forming a mental picture, making meaningful associations, connecting new information to previously  learned and related information, paraphrasing and repetition.  . To the extent possible, multitasking should be avoided; break down tasks into smaller steps to help get started and to keep from feeling overwhelmed. And if there are difficulties in organization and planning, maintaining a daily organizer to help keep track of important appointments and information may be beneficial.   . Memory problems may at least be minimally addressed using compensatory strategies such as the use of a daily schedule to follow, memos, portable recorder, a centrally located bulletin board, or memory notebook. A large calendar, placed in a highly visible location would be valuable to keep track of dates and appointments.  In addition, it would be helpful to keep a log of all of medical appointments with the name of the doctor, date of visit, diagnoses, and treatments.  . Use of a medication box is recommended to ensure compliance and decrease confusion regarding medication dosages, times, and dates. . To aid in managing problems with  attention, the patient may consider using some of the following strategies: o The patient should simplify tasks.  There may be a need to break overly complex activities into simple step-by-step tasks, keep these steps written down in a note book and then check them off as they are completed which will help to stay on task and make sure the whole task is finished.  o The patient should set deadlines for everything, even for seemingly small tasks, prioritize time-sensitive tasks and write down every assignment, message, or important thought. o The patient is encouraged to use timers and alarms to stay on track and take breaks at regular intervals. Avoid piles of paperwork or procrastination by dealing with each item as it comes in.  Techniques for making living spaces safer:  . Make the stovetop and oven inaccessible by removing or covering the knobs . Secure all medications in a safe  place . Unplug appliances when not in use . Reduce tripping hazards such as moving any cords out of the way . Could consider marking the edges of steps with colored tape . Install night lights throughout the house  Preventing fraudulent activity: individuals with Alzheimer's disease and other dementias may be at increased risk of being taken advantage of. Consider the following:  . Limit access to credit cards or cash . Could consider placing a sign on the front door such as "No Solicitations" . Could contact the [place] "Do Not Call" list and add the person's number. They can be reached at [phone]  Techniques of communicating with a person who has dementia/memory impairment:  . Maintain a regular schedule for as many activities as possible. . Practice reality orientation. . Repeat information quietly and firmly. Marland Kitchen Keep the voice low and calm and be rational and understanding. . Talk in a warm and encouraging manner. . Talk gently and calmly; smile and be relaxed. . Use normal voice tone, do not be condescending. . Talk in a quiet place without distraction. . Show respect and acceptance. . Use one sentence for each idea and check to see if the patient understands before proceeding. . Do not interrupt, signal acceptance of message by nods, or smiles when appropriate, and repeat what the patient has said when the message is complete. Marland Kitchen Keep stress, arguing, disagreements, and verbal tirades to a minimum, as any additional stressor on the patient will cause continued and more rapid deterioration.  In general, the best way to manage the behavioral and psychological symptoms of dementia such as agitation involves behavioral strategies such as using the three R's.  o Redirection (help distract your loved one by focusing their attention on something else, moving them to a new environment, or otherwise engaging them in something other than what is distressing to them)   o Reassurance (reassure them that you are there to take care of them and that there is nothing they need to be worried about), and  o Reconsidering (consider the situation from their perspective and try to identify if there is something about the situation or environment that may be triggering their reaction).  Hallucinations are false perceptions of objects or events involving the senses. These false perceptions can be caused by changes within the brain that result from Alzheimer's, usually in the later stages of the disease, although they can also occur as a result of vision loss. The following strategies may be helpful in responding to the patient's hallucinations: o Offer reassurance - Respond in a calm, supportive manner. You  may want to respond with, "Don't worry. I'm here. I'll protect you. I'll take care of you." - Gentle patting may turn the person's attention toward you and reduce the hallucination. - Acknowledge the feelings behind the hallucination and try to find out what the hallucination means to the individual. You might want to say, "It sounds as if you're worried" or "I know this is frightening for you." o Use distractions - Suggest a walk or move to another room. Frightening hallucinations often subside in well-lit areas where other people are present. - Try to turn the person's attention to music, conversation or activities you enjoy together. o Respond honestly - If the person asks you about a hallucination or delusion, be honest. For example, if she asks, "Do you see him?" you may want to answer with, "I know you see something, but I don't see it." This way, you're not denying what the person sees or hears, but you avoid an argument. o Modify the environment - Check for sounds that might be misinterpreted, such as noise from a television or an air conditioner. - Look for lighting that casts shadows, reflections or distortions on the surfaces of floors, walls and furniture. Turn  on lights to reduce shadows. - Cover mirrors with a cloth or remove them if the patient thinks that she is looking at a stranger.  Delusions (firmly held beliefs in things that are not real) may occur in middle-to-late-stage dementia. Confusion and memory loss -- such as the inability to remember certain people or objects -- can contribute to these untrue beliefs. A person with dementia may believe a family member is stealing their possessions or that they are being followed by the police. This kind of suspicious delusion is sometimes referred to as paranoia. Although not grounded in reality, the situation is very real to the person with dementia. Keep in mind that a person with dementia is trying to make sense of their world with declining cognitive function. A delusion is not the same thing as a hallucination. While delusions involve false beliefs, hallucinations are false perceptions of objects or events that are sensory in nature. The following strategies may be helpful in responding to delusions: o Don't take offense. Listen to what is troubling the person, and try to understand that reality. Then be reassuring, and let the person know you care. o Don't argue or try to convince. Allow the individual to express ideas. Acknowledge her opinions. o Offer a simple answer. Share your thoughts with Ms. Staggs, but keep it simple. Don't overwhelm her with lengthy explanations or reasons. o Switch the focus to another activity. Engage the individual in an activity, or ask for help with a chore. o Duplicate any lost items. If Ms. Ovitt is often searching for a specific item, have several available (if possible). For example, if she is always looking for her wallet, purchase two of the same kind.  Feedback to Patient: YUDIT MODESITT will return for a feedback appointment on 06/14/20 to review the results of her neuropsychological evaluation with Dr. Sima Matas.   Thank you for your referral of RYLAH FUKUDA. Please feel free to contact us if you have any questions or concerns regarding this report.  This report is intended solely for the confidential review and use by the referring professional to assist in diagnostic and medical decision making needs.  This report should not be released to a third party without proper consent. [NOTE: data can be made available to qualified professionals  with permission from the patient or legal representative/caregiver]    ____________________________________ Alfonso Ellis, PsyD Clinical Psychologist (Provisional) Neuropsychologist

## 2020-06-06 NOTE — Assessment & Plan Note (Signed)
The patient is taking Aricept She has been referred to see neuropsychologist for special testing Based on my interaction with patients, she appears to have poor short-term memory According to her son, she is still competent to make medical decision but limited capacity

## 2020-06-06 NOTE — Assessment & Plan Note (Signed)
She has remote history of breast cancer Recent PET CT scan is negative Observe for now

## 2020-06-06 NOTE — Assessment & Plan Note (Signed)
She has profound abnormal weight loss We discussed the importance of frequent small meals I will refer her to see dietitian in the future

## 2020-06-06 NOTE — Assessment & Plan Note (Signed)
I have personally reviewed her imaging studies Her case was recently discussed at the tumor board Based on PET CT scan and clinical findings, the patient have locally invasive tumor However, her biopsy from July was suspicious for invasion but not conclusive She underwent recent bilateral lymphadenectomy but results were benign At this point in time, it is difficult to get insurance approval for concurrent chemoradiation therapy without definitive invasive cancer seen on tissue histology Also, if we were to start concurrent chemoradiation therapy and if she have residual disease in the future, repeat biopsy may came back necrotic tissue only and we might not be able to get immunotherapy approved in the future I think it is important to get all the information now, upfront, before we proceed with definitive treatment Repeating biopsy right now we will change future management and I think that is important The risk and benefits of repeat biopsy is fully discussed with her son and he is in agreement I will attempt to call her GYN oncologist to discuss this She will proceed with treatment planning with radiation department as scheduled I will see her back once biopsy is performed If invasive cancer is seen, we will proceed with concurrent chemoradiation therapy with cisplatin as chemo sensitizing agent

## 2020-06-07 ENCOUNTER — Other Ambulatory Visit: Payer: Self-pay | Admitting: Hematology and Oncology

## 2020-06-07 ENCOUNTER — Telehealth: Payer: Self-pay | Admitting: Oncology

## 2020-06-07 DIAGNOSIS — C519 Malignant neoplasm of vulva, unspecified: Secondary | ICD-10-CM

## 2020-06-07 DIAGNOSIS — Z7189 Other specified counseling: Secondary | ICD-10-CM | POA: Insufficient documentation

## 2020-06-07 NOTE — Telephone Encounter (Signed)
Called [name] and advised him of appointment for port placement on 06/13/20 (arrive at 1 pm, NPO after 8 am, needs a driver) and follow up with Dr. Alvy Bimler on 06/15/20 at 12:15 with chemo class to follow at 2 pm.  [name] said he will attend these appointments.  Also discussed that chemo is planned to start on 06/16/20 and that the schedulers will call with the appointment times.  [name] requested for chemo to start on 06/19/20 if possible.

## 2020-06-07 NOTE — Progress Notes (Signed)
START OFF PATHWAY REGIMEN - Other   [id number]:Cisplatin 40 mg/m2 IV D1 q7 Days + RT:   A cycle is every 7 days:     Cisplatin   **Always confirm dose/schedule in your pharmacy ordering system**  Patient Characteristics: Intent of Therapy: Non-Curative / Palliative Intent, Discussed with Patient

## 2020-06-08 DIAGNOSIS — D071 Carcinoma in situ of vulva: Secondary | ICD-10-CM | POA: Diagnosis not present

## 2020-06-08 DIAGNOSIS — C519 Malignant neoplasm of vulva, unspecified: Secondary | ICD-10-CM | POA: Diagnosis not present

## 2020-06-08 DIAGNOSIS — N9089 Other specified noninflammatory disorders of vulva and perineum: Secondary | ICD-10-CM | POA: Diagnosis not present

## 2020-06-12 ENCOUNTER — Other Ambulatory Visit: Payer: Self-pay | Admitting: Radiology

## 2020-06-13 ENCOUNTER — Other Ambulatory Visit: Payer: Self-pay | Admitting: Radiology

## 2020-06-13 ENCOUNTER — Other Ambulatory Visit: Payer: Self-pay

## 2020-06-13 ENCOUNTER — Encounter (HOSPITAL_COMMUNITY): Payer: Self-pay

## 2020-06-13 ENCOUNTER — Ambulatory Visit (HOSPITAL_COMMUNITY)
Admission: RE | Admit: 2020-06-13 | Discharge: 2020-06-13 | Disposition: A | Discharge status: Home / Self Care | Payer: Medicare PPO | Source: Ambulatory Visit | Attending: Hematology and Oncology | Admitting: Hematology and Oncology

## 2020-06-13 ENCOUNTER — Telehealth: Payer: Self-pay | Admitting: Oncology

## 2020-06-13 DIAGNOSIS — C519 Malignant neoplasm of vulva, unspecified: Secondary | ICD-10-CM

## 2020-06-13 DIAGNOSIS — E119 Type 2 diabetes mellitus without complications: Secondary | ICD-10-CM | POA: Insufficient documentation

## 2020-06-13 DIAGNOSIS — Z950 Presence of cardiac pacemaker: Secondary | ICD-10-CM | POA: Insufficient documentation

## 2020-06-13 DIAGNOSIS — C518 Malignant neoplasm of overlapping sites of vulva: Secondary | ICD-10-CM | POA: Diagnosis not present

## 2020-06-13 DIAGNOSIS — E559 Vitamin D deficiency, unspecified: Secondary | ICD-10-CM | POA: Insufficient documentation

## 2020-06-13 DIAGNOSIS — Z923 Personal history of irradiation: Secondary | ICD-10-CM | POA: Diagnosis not present

## 2020-06-13 DIAGNOSIS — I442 Atrioventricular block, complete: Secondary | ICD-10-CM | POA: Insufficient documentation

## 2020-06-13 DIAGNOSIS — E7800 Pure hypercholesterolemia, unspecified: Secondary | ICD-10-CM | POA: Diagnosis not present

## 2020-06-13 DIAGNOSIS — Z452 Encounter for adjustment and management of vascular access device: Secondary | ICD-10-CM | POA: Diagnosis not present

## 2020-06-13 DIAGNOSIS — Z853 Personal history of malignant neoplasm of breast: Secondary | ICD-10-CM | POA: Insufficient documentation

## 2020-06-13 DIAGNOSIS — Z79899 Other long term (current) drug therapy: Secondary | ICD-10-CM | POA: Diagnosis not present

## 2020-06-13 DIAGNOSIS — Z510 Encounter for antineoplastic radiation therapy: Secondary | ICD-10-CM | POA: Diagnosis not present

## 2020-06-13 HISTORY — PX: IR IMAGING GUIDED PORT INSERTION: IMG5740

## 2020-06-13 LAB — PROTIME-INR
INR: 1 (ref 0.8–1.2)
Prothrombin Time: 13 seconds (ref 11.4–15.2)

## 2020-06-13 LAB — CBC WITH DIFFERENTIAL/PLATELET
Abs Immature Granulocytes: 0.05 10*3/uL (ref 0.00–0.07)
Basophils Absolute: 0 10*3/uL (ref 0.0–0.1)
Basophils Relative: 0 %
Eosinophils Absolute: 0 10*3/uL (ref 0.0–0.5)
Eosinophils Relative: 0 %
HCT: 35.4 % — ABNORMAL LOW (ref 36.0–46.0)
Hemoglobin: 11.2 g/dL — ABNORMAL LOW (ref 12.0–15.0)
Immature Granulocytes: 0 %
Lymphocytes Relative: 21 %
Lymphs Abs: 2.4 10*3/uL (ref 0.7–4.0)
MCH: 28 pg (ref 26.0–34.0)
MCHC: 31.6 g/dL (ref 30.0–36.0)
MCV: 88.5 fL (ref 80.0–100.0)
Monocytes Absolute: 0.8 10*3/uL (ref 0.1–1.0)
Monocytes Relative: 7 %
Neutro Abs: 8.2 10*3/uL — ABNORMAL HIGH (ref 1.7–7.7)
Neutrophils Relative %: 72 %
Platelets: 272 10*3/uL (ref 150–400)
RBC: 4 MIL/uL (ref 3.87–5.11)
RDW: 14.1 % (ref 11.5–15.5)
WBC: 11.5 10*3/uL — ABNORMAL HIGH (ref 4.0–10.5)
nRBC: 0 % (ref 0.0–0.2)

## 2020-06-13 LAB — COMPREHENSIVE METABOLIC PANEL
ALT: 9 U/L (ref 0–44)
AST: 16 U/L (ref 15–41)
Albumin: 3.8 g/dL (ref 3.5–5.0)
Alkaline Phosphatase: 66 U/L (ref 38–126)
Anion gap: 17 — ABNORMAL HIGH (ref 5–15)
BUN: 18 mg/dL (ref 8–23)
CO2: 26 mmol/L (ref 22–32)
Calcium: 10.4 mg/dL — ABNORMAL HIGH (ref 8.9–10.3)
Chloride: 98 mmol/L (ref 98–111)
Creatinine, Ser: 0.87 mg/dL (ref 0.44–1.00)
GFR calc Af Amer: >60 mL/min (ref >60)
GFR calc non Af Amer: >60 mL/min (ref >60)
Glucose, Bld: 96 mg/dL (ref 70–99)
Potassium: 3.9 mmol/L (ref 3.5–5.1)
Sodium: 141 mmol/L (ref 135–145)
Total Bilirubin: 0.6 mg/dL (ref 0.3–1.2)
Total Protein: 8.3 g/dL — ABNORMAL HIGH (ref 6.5–8.1)

## 2020-06-13 MED ORDER — LIDOCAINE-EPINEPHRINE 1 %-1:100000 IJ SOLN
INTRAMUSCULAR | Status: AC
  Filled 2020-06-13: qty 1

## 2020-06-13 MED ORDER — FENTANYL CITRATE (PF) 100 MCG/2ML IJ SOLN
INTRAMUSCULAR | Status: AC | PRN
  Administered 2020-06-13 (×2): 50 ug via INTRAVENOUS

## 2020-06-13 MED ORDER — HEPARIN SOD (PORK) LOCK FLUSH 100 UNIT/ML IV SOLN
INTRAVENOUS | Status: AC | PRN
  Administered 2020-06-13: 500 [IU] via INTRAVENOUS

## 2020-06-13 MED ORDER — LIDOCAINE-EPINEPHRINE 1 %-1:100000 IJ SOLN
INTRAMUSCULAR | Status: AC | PRN
  Administered 2020-06-13: 10 mL via INTRADERMAL

## 2020-06-13 MED ORDER — FENTANYL CITRATE (PF) 100 MCG/2ML IJ SOLN
INTRAMUSCULAR | Status: DC
Start: 2020-06-13 — End: 2020-06-14
  Filled 2020-06-13: qty 2

## 2020-06-13 MED ORDER — SODIUM CHLORIDE 0.9 % IV SOLN: INTRAVENOUS | Status: DC

## 2020-06-13 MED ORDER — MIDAZOLAM HCL 2 MG/2ML IJ SOLN
INTRAMUSCULAR | Status: AC
  Filled 2020-06-13: qty 4

## 2020-06-13 MED ORDER — CEFAZOLIN SODIUM-DEXTROSE 2-4 GM/100ML-% IV SOLN: 2.0000 g | Freq: Once | INTRAVENOUS | Status: AC

## 2020-06-13 MED ORDER — CEFAZOLIN SODIUM-DEXTROSE 2-4 GM/100ML-% IV SOLN
INTRAVENOUS | Status: AC
  Administered 2020-06-13: 2 g via INTRAVENOUS
  Filled 2020-06-13: qty 100

## 2020-06-13 MED ORDER — CEFAZOLIN SODIUM-DEXTROSE 1-4 GM/50ML-% IV SOLN
1.0000 g | Freq: Once | INTRAVENOUS | Status: DC
  Filled 2020-06-13: qty 50

## 2020-06-13 MED ORDER — MIDAZOLAM HCL 2 MG/2ML IJ SOLN
INTRAMUSCULAR | Status: AC | PRN
  Administered 2020-06-13 (×2): 1 mg via INTRAVENOUS

## 2020-06-13 MED ORDER — HEPARIN SOD (PORK) LOCK FLUSH 100 UNIT/ML IV SOLN
INTRAVENOUS | Status: AC
  Filled 2020-06-13: qty 5

## 2020-06-13 NOTE — Telephone Encounter (Signed)
Requested per Dr. Alvy Bimler: CBC p diff and CMP to be drawn today with port placement with Tiffany in [facility].

## 2020-06-13 NOTE — Progress Notes (Signed)
Pharmacist Chemotherapy Monitoring - Initial Assessment    Anticipated start date: 06/19/20   Regimen:  . Are orders appropriate based on the patient's diagnosis, regimen, and cycle? Yes . Does the plan date match the patient's scheduled date? Yes . Is the sequencing of drugs appropriate? Yes . Are the premedications appropriate for the patient's regimen? Yes . Prior Authorization for treatment is: Pending o If applicable, is the correct biosimilar selected based on the patient's insurance? not applicable  Organ Function and Labs: Marland Kitchen Are dose adjustments needed based on the patient's renal function, hepatic function, or hematologic function? No . Are appropriate labs ordered prior to the start of patient's treatment? Yes . Other organ system assessment, if indicated: cisplatin: baseline audiogram . The following baseline labs, if indicated, have been ordered: N/A  Dose Assessment: . Are the drug doses appropriate? Yes . Are the following correct: o Drug concentrations Yes o IV fluid compatible with drug Yes o Administration routes Yes o Timing of therapy Yes . If applicable, does the patient have documented access for treatment and/or plans for port-a-cath placement? yes . If applicable, have lifetime cumulative doses been properly documented and assessed? not applicable Lifetime Dose Tracking  No doses have been documented on this patient for the following tracked chemicals: Doxorubicin, Epirubicin, Idarubicin, Daunorubicin, Mitoxantrone, Bleomycin, Oxaliplatin, Carboplatin, Liposomal Doxorubicin  o   Toxicity Monitoring/Prevention: . The patient has the following take home antiemetics prescribed: Ondansetron, Prochlorperazine and Lorazepam . The patient has the following take home medications prescribed: N/A . Medication allergies and previous infusion related reactions, if applicable, have been reviewed and addressed. Yes . The patient's current medication list has been assessed  for drug-drug interactions with their chemotherapy regimen. no significant drug-drug interactions were identified on review.  Order Review: . Are the treatment plan orders signed? Yes . Is the patient scheduled to see a provider prior to their treatment? Yes  I verify that I have reviewed each item in the above checklist and answered each question accordingly.  Sarthak Rubenstein K 06/13/2020 2:50 PM

## 2020-06-13 NOTE — Procedures (Signed)
  Procedure: R IJ Port catheter placement   EBL:   minimal Complications:  none immediate  See full dictation in [facility] PACS.  D. Ulas Zuercher MD Main # [phone] Pager  [phone] Mobile [phone]     

## 2020-06-13 NOTE — Discharge Instructions (Addendum)

## 2020-06-13 NOTE — H&P (Signed)
Chief Complaint: Recurrent vulvar cancer. Request is for portacath placement for chemotherapy access  Referring Physician(s): Gorsuch,Ni  Supervising Physician: Arne Cleveland  Patient Status: [facility] - Out-pt  History of Present Illness: [name] is a 74 y.o. female  outpatient. 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  Pet scan from 8.10.21. Team is requesting portacath placement for chemotherapy access.     Past Medical History:  Diagnosis Date  . Bradycardia 06/2018  . Breast cancer ([place])   . Cancer [facility]) left    breast cancer 2005 surgery, chemo and radiation   . Complete heart block ([place])   . Diabetes mellitus   . Headache   . Hypercholesteremia   . Macular degeneration   . Osteoarthritis   . 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 Vitamin D deficiency   . Vulvar cancer ([place]) 2017 new dx    Past Surgical History:  Procedure Laterality Date  . ABDOMINAL HYSTERECTOMY     complete  . BREAST SURGERY Left 2006   left breast lumpectomy , chemo and radiation done  . COLONOSCOPY WITH PROPOFOL N/A 07/24/2015   Procedure: COLONOSCOPY WITH PROPOFOL;  Surgeon: Garlan Fair, MD;  Location: [facility] ENDOSCOPY;  Service: Endoscopy;  Laterality: N/A;  . EYE SURGERY     removal of growth 35 years ago  . HEMORROIDECTOMY     38 years ago, no present symtoms  . KNEE SURGERY Bilateral   . PACEMAKER IMPLANT N/A 07/16/2018   Procedure: PACEMAKER IMPLANT;  Surgeon: Evans Lance, MD;  Location: [place] [facility];  Service: Cardiovascular;  Laterality: N/A;  . RADICAL VULVECTOMY N/A 02/01/2016   Procedure: RADICAL VULVECTOMY;  Surgeon: [name], MD;  Location: [facility] ORS;  Service: Gynecology;  Laterality: N/A;    Allergies: Duloxetine  Medications: Prior to Admission medications   Medication Sig Start Date End Date  Taking? Authorizing Provider  Cyanocobalamin (VITAMIN B 12 PO) Take 1,000 mcg by mouth daily.    [provider]  donepezil (ARICEPT) 10 MG tablet Take 10 mg by mouth at bedtime.    [provider]  ergocalciferol (VITAMIN D2) 50000 UNITS capsule Take 50,000 Units by mouth every Tuesday.     [provider]  flecainide (TAMBOCOR) 50 MG tablet Take 1 tablet (50 mg total) by mouth 2 (two) times daily. 10/20/18   Evans Lance, MD  HYDROcodone-acetaminophen (NORCO/VICODIN) 5-325 MG tablet Take 1 tablet by mouth every 6 (six) hours as needed. 05/23/20 06/22/20  [provider]  metoprolol tartrate (LOPRESSOR) 25 MG tablet Take 1 tablet (25 mg total) by mouth 2 (two) times daily. 03/29/19   Evans Lance, MD  Omega-3 Fatty Acids (FISH OIL) 1200 MG CAPS Take 2 capsules by mouth every morning.    [provider]  sertraline (ZOLOFT) 100 MG tablet Take 1 tablet (100 mg total) by mouth daily. 11/30/19   Melvenia Beam, MD     Family History  Problem Relation Age of Onset  . Cancer Maternal Grandmother   . Dementia Mother   . Other Father        possible Dementia    Social History   Socioeconomic History  . Marital status: Married    Spouse name: Not on file  . Number of children: 2  . Years of education: Not on file  .  Highest education level: Master's degree (e.g., MA, MS, MEng, MEd, MSW, MBA)  Occupational History  . Not on file  Tobacco Use  . Smoking status: Never Smoker  . Smokeless tobacco: Never Used  Vaping Use  . Vaping Use: Never used  Substance and Sexual Activity  . Alcohol use: Never  . Drug use: Never  . Sexual activity: Yes  Other Topics Concern  . Not on file  Social History Narrative   Lives at home with husband   Right handed   Caffeine: never   Social Determinants of Health   Financial Resource Strain: Low Risk   . Difficulty of Paying Living Expenses: Not hard at all  Food Insecurity: No Food Insecurity  .  Worried About Charity fundraiser in the Last Year: Never true  . Ran Out of Food in the Last Year: Never true  Transportation Needs: No Transportation Needs  . Lack of Transportation (Medical): No  . Lack of Transportation (Non-Medical): No  Physical Activity:   . Days of Exercise per Week: Not on file  . Minutes of Exercise per Session: Not on file  Stress:   . Feeling of Stress : Not on file  Social Connections: Moderately Isolated  . Frequency of Communication with Friends and Family: More than three times a week  . Frequency of Social Gatherings with Friends and Family: More than three times a week  . Attends Religious Services: Never  . Active Member of Clubs or Organizations: No  . Attends Archivist Meetings: Never  . Marital Status: Married     Review of Systems: A 12 point ROS discussed and pertinent positives are indicated in the HPI above.  All other systems are negative.  Review of Systems  Constitutional: Negative for fatigue and fever.  HENT: Negative for congestion.   Respiratory: Negative for cough and shortness of breath.   Gastrointestinal: Negative for abdominal pain, diarrhea, nausea and vomiting.    Vital Signs: BP (!) 141/68   Pulse 88   Temp 98.5 F (36.9 C) (Oral)   Resp 18   SpO2 100%   Physical Exam Vitals and nursing note reviewed.  Constitutional:      Appearance: She is well-developed.  HENT:     Head: Normocephalic and atraumatic.  Eyes:     Conjunctiva/sclera: Conjunctivae normal.  Cardiovascular:     Rate and Rhythm: Normal rate and regular rhythm.     Heart sounds: Normal heart sounds.     Comments: Left sided pacemaker in place. Pulmonary:     Effort: Pulmonary effort is normal.     Breath sounds: Normal breath sounds.  Musculoskeletal:        General: Normal range of motion.     Cervical back: Normal range of motion.  Skin:    General: Skin is warm.  Neurological:     Mental Status: She is alert. She is  disoriented.     Imaging: No results found.  Labs:  CBC: Recent Labs    06/13/20 1345  WBC 11.5*  HGB 11.2*  HCT 35.4*  PLT 272    COAGS: No results for input(s): INR, APTT in the last 8760 hours.  BMP: No results for input(s): NA, K, CL, CO2, GLUCOSE, BUN, CALCIUM, CREATININE, GFRNONAA, GFRAA in the last 8760 hours.  Invalid input(s): CMP  LIVER FUNCTION TESTS: No results for input(s): BILITOT, AST, ALT, ALKPHOS, PROT, ALBUMIN in the last 8760 hours.    Assessment and Plan:  74 y.o.  female outpatient. 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  Pet scan from 8.10.21. Team is requesting portacath placement for chemotherapy access.     All labs and medications are within acceptable parameters. No pertinent allergies. Patient has been NPO since 8 am    Risks and benefits of image guided port-a-catheter placement was discussed with the patient and patient's son including, but not limited to bleeding, infection, pneumothorax, or fibrin sheath development and need for additional procedures.  All of the patient's questions were answered, patient is agreeable to proceed. Consent signed and in chart.   Thank you for this interesting consult.  I greatly enjoyed meeting MIKEL PYON and look forward to participating in their care.  A copy of this report was sent to the requesting provider on this date.  Electronically Signed: Jacqualine Mau, NP 06/13/2020, 2:07 PM   I spent a total of  40 Minutes   in face to face in clinical consultation, greater than 50% of which was counseling/coordinating care for portacath placement.

## 2020-06-14 ENCOUNTER — Encounter: Payer: Medicare PPO | Admitting: Psychology

## 2020-06-14 ENCOUNTER — Other Ambulatory Visit: Payer: Self-pay | Admitting: Hematology and Oncology

## 2020-06-14 ENCOUNTER — Other Ambulatory Visit: Payer: Self-pay

## 2020-06-14 DIAGNOSIS — R413 Other amnesia: Secondary | ICD-10-CM | POA: Diagnosis not present

## 2020-06-14 DIAGNOSIS — F0151 Vascular dementia with behavioral disturbance: Secondary | ICD-10-CM | POA: Diagnosis not present

## 2020-06-14 DIAGNOSIS — F0391 Unspecified dementia with behavioral disturbance: Secondary | ICD-10-CM

## 2020-06-14 DIAGNOSIS — F0789 Other personality and behavioral disorders due to known physiological condition: Secondary | ICD-10-CM | POA: Diagnosis not present

## 2020-06-14 DIAGNOSIS — F09 Unspecified mental disorder due to known physiological condition: Secondary | ICD-10-CM | POA: Diagnosis not present

## 2020-06-15 ENCOUNTER — Encounter: Payer: Self-pay | Admitting: Hematology and Oncology

## 2020-06-15 ENCOUNTER — Inpatient Hospital Stay (HOSPITAL_BASED_OUTPATIENT_CLINIC_OR_DEPARTMENT_OTHER): Payer: Medicare PPO | Admitting: Hematology and Oncology

## 2020-06-15 ENCOUNTER — Inpatient Hospital Stay: Payer: Medicare PPO

## 2020-06-15 ENCOUNTER — Ambulatory Visit
Admission: RE | Admit: 2020-06-15 | Discharge: 2020-06-15 | Disposition: A | Discharge status: Home / Self Care | Payer: Medicare PPO | Source: Ambulatory Visit | Attending: Radiation Oncology | Admitting: Radiation Oncology

## 2020-06-15 VITALS — BP 133/86 | HR 88 | Temp 99.3°F | Resp 18 | Ht 60.0 in | Wt 128.6 lb

## 2020-06-15 DIAGNOSIS — C519 Malignant neoplasm of vulva, unspecified: Secondary | ICD-10-CM | POA: Diagnosis not present

## 2020-06-15 DIAGNOSIS — I442 Atrioventricular block, complete: Secondary | ICD-10-CM | POA: Diagnosis not present

## 2020-06-15 DIAGNOSIS — Z510 Encounter for antineoplastic radiation therapy: Secondary | ICD-10-CM | POA: Diagnosis not present

## 2020-06-15 DIAGNOSIS — T8189XA Other complications of procedures, not elsewhere classified, initial encounter: Secondary | ICD-10-CM | POA: Diagnosis not present

## 2020-06-15 DIAGNOSIS — R634 Abnormal weight loss: Secondary | ICD-10-CM | POA: Diagnosis not present

## 2020-06-15 DIAGNOSIS — Z7189 Other specified counseling: Secondary | ICD-10-CM | POA: Diagnosis not present

## 2020-06-15 DIAGNOSIS — C518 Malignant neoplasm of overlapping sites of vulva: Secondary | ICD-10-CM | POA: Diagnosis not present

## 2020-06-15 DIAGNOSIS — Z9221 Personal history of antineoplastic chemotherapy: Secondary | ICD-10-CM | POA: Diagnosis not present

## 2020-06-15 DIAGNOSIS — Z923 Personal history of irradiation: Secondary | ICD-10-CM | POA: Diagnosis not present

## 2020-06-15 DIAGNOSIS — Z853 Personal history of malignant neoplasm of breast: Secondary | ICD-10-CM | POA: Diagnosis not present

## 2020-06-15 DIAGNOSIS — G893 Neoplasm related pain (acute) (chronic): Secondary | ICD-10-CM

## 2020-06-15 DIAGNOSIS — Z5111 Encounter for antineoplastic chemotherapy: Secondary | ICD-10-CM | POA: Diagnosis not present

## 2020-06-15 DIAGNOSIS — R413 Other amnesia: Secondary | ICD-10-CM | POA: Diagnosis not present

## 2020-06-15 DIAGNOSIS — E119 Type 2 diabetes mellitus without complications: Secondary | ICD-10-CM | POA: Diagnosis not present

## 2020-06-15 MED ORDER — PROCHLORPERAZINE MALEATE 10 MG PO TABS: 10.0000 mg | ORAL_TABLET | Freq: Four times a day (QID) | ORAL | 1 refills | Status: DC | PRN

## 2020-06-15 MED ORDER — LIDOCAINE-PRILOCAINE 2.5-2.5 % EX CREA: TOPICAL_CREAM | CUTANEOUS | 3 refills | Status: DC

## 2020-06-15 MED ORDER — MIRTAZAPINE 15 MG PO TABS: 15.0000 mg | ORAL_TABLET | Freq: Every day | ORAL | 11 refills | Status: DC

## 2020-06-15 MED ORDER — ONDANSETRON HCL 8 MG PO TABS: 8.0000 mg | ORAL_TABLET | Freq: Three times a day (TID) | ORAL | 1 refills | Status: DC | PRN

## 2020-06-15 MED ORDER — HYDROCODONE-ACETAMINOPHEN 5-325 MG PO TABS
1.0000 | ORAL_TABLET | Freq: Four times a day (QID) | ORAL | 0 refills | Status: DC | PRN
Start: 2020-06-15 — End: 2020-06-29

## 2020-06-15 MED FILL — LIDOCAINE-PRILOCAINE CREAM: 2.5-2.5 | 30 days supply | Qty: 30 | Fill #0

## 2020-06-15 MED FILL — PROCHLORPERAZINE 10 MG TAB: 10 | 7 days supply | Qty: 30 | Fill #0

## 2020-06-15 MED FILL — MIRTAZAPINE 15 MG TABS: 15 | 30 days supply | Qty: 30 | Fill #0

## 2020-06-15 MED FILL — HYDROCODON-APAP 5-325: 5-325 | 7 days supply | Qty: 30 | Fill #0

## 2020-06-15 MED FILL — ONDANSETRON HCL 8 MG TABLET: 8 | 10 days supply | Qty: 30 | Fill #0

## 2020-06-15 NOTE — Progress Notes (Signed)
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 The intent is of curative intent.  We discussed some of the risks, benefits, side-effects of cisplatin and its role as chemo sensitizing agent. The plan for weekly cisplatin for x5 doses along with radiation treatment.  Some of the short term side-effects included, though not limited to, including weight loss, life threatening infections, risk of allergic reactions, need for transfusions of blood products, nausea, vomiting, change in bowel habits, loss of hair, admission to hospital for various reasons, and risks of death.   Long term side-effects are also discussed including risks of infertility, permanent damage to nerve function, hearing loss, chronic fatigue, kidney damage with possibility needing hemodialysis, and rare secondary malignancy including bone marrow disorders.  The patient is aware that the response rates discussed earlier is not guaranteed.  After a long discussion, patient made an informed decision to proceed with the prescribed plan of care.   Patient education material was dispensed.     Weight loss, abnormal She continues to have progressive weight loss We discussed the importance of frequent small meals I will start her on Remeron to see if we can help improve her appetite  Surgical wound, non healing Her port site is healing well However, the surgical drain exit site on the right groin is still not healed I placed a Band-Aid on it I do not believe this will delay treatment necessarily I plan to check it again next week  Cancer associated pain I refill her prescription pain medicine We discussed narcotic refill policy I warned her about risk of nausea and constipation   No orders of the defined types were placed in this encounter.   All  questions were answered. The patient knows to call the clinic with any problems, questions or concerns. The total time spent in the appointment was 40 minutes encounter with patients including review of chart and various tests results, discussions about plan of care and coordination of care plan   Heath Lark, MD 06/15/2020 1:40 PM  INTERVAL HISTORY: Please see below for problem oriented charting. She is here accompanied by her son She returns for further discussion about plan of care Due to her cognitive impairment, her son is with her for all visits Her surgical drains were removed by GYN surgeon Repeat biopsy show squamous cell carcinoma, at least with in situ component She denies recent bleeding but she wears a pad due to intermittent bleeding/discharge She continues to have progressive weight loss She stated she has no appetite Her son has to remind her to eat and drink on a regular basis Her pain is well controlled  SUMMARY OF ONCOLOGIC HISTORY: Oncology History  Vulvar cancer ([place])  02/01/2016 Surgery   Preop Diagnosis: Vulvar mass, 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 Freddi Che), multiple vulvar re-excision  Surgeon: Margaretmary Bayley, MD; Andi Devon MD  Findings: Bilateral  sentinel LN identified, both hot and blue. See Dr. Bethel Born note for full details. Posterior vulva with evidence of partial wound breakdown without evidence of infection. Granulation tissue noted with exposed  subcutaneous sutures. 2x2cm concerning nodular plaque at 7 o'clock, medial margin <1cm from anus. Rectal exam at conclusion normal.   Specimens: 1. Left vulvar margin 2. Right vulvar margin 3. Left sentinel inguinal LN 4. Right sentinel inguinal LN 5. Vulvar lesion at 7 oclock, stitch at 12 oclock    03/04/2016 Pathology Results   A: Lymph node, right inguinal sentinel #2, removal - 1 lymph node negative for metastatic carcinoma (0/1). - Pan cytokeratin confirmatory (negative).   B: Lymph node, right inguinal sentinel #1, removal  - 2 lymph nodes, negative for metastatic carcinoma (0/2). - Pan cytokeratin confirmatory (negative).   C: Lymph node, left inguinal sentinel #1, removal  - 1 lymph node negative for metastatic carcinoma (0/1). - Pan cytokeratin confirmatory (negative).   D: Vulva, left margin, biopsy  - Ulcerated squamous tissue and granulation tissue. - Acute inflammation and suture granuloma. - No dysplasia or malignancy identified.   E: Vulva, right margin, biopsy  - Skin and subcutaneous tissue with fat necrosis, ulceration, fibrosis and granulation tissue. - No dysplasia or malignancy identified.   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 - Margins for invasive carcinoma are negative (deep 4 mm, 3 o'clock half 5 mm and 9 o'clock half 7 mm) - Margins for HSIL:  3-o'clock half positive for VIN 2. - Other findings: Fat necrosis, fibrosis and chronic inflammation   04/11/2020 Pathology Results   1. Vulva, biopsy, top - AT LEAST SQUAMOUS CELL CARCINOMA IN SITU, SUSPICIOUS FOR INVASION. SEE NOTE 2. Vulva, biopsy, middle - SQUAMOUS CARCINOMA IN SITU. SEE NOTE 3. Vulva, biopsy, bottom -  SQUAMOUS CARCINOMA IN SITU. SEE NOTE   05/03/2020 PET scan   Hypermetabolic bilateral vulvar skin thickening max SUV 19, consistent with patient's known malignancy.   Hypermetabolic bilateral inguinal lymph nodes.  Right inguinal node measuring 16 x 25 mm max SUV 6.9  Left inguinal lymph node measuring 12 x 15 mm max SUV 5.3   No other worrisome hypermetabolic activity identified.   Additional findings:  Lobulated bilateral renal cortex suggesting scarring.  Postop change with the left breast.   05/16/2020 Surgery   Surgeon(s) and Role: * Hart Rochester, MD - Primary  Specimens:  ID Type Source Tests Collected by Time  1 : RIGHT INGUINAL Tissue Lymph Node PATHOLOGY TISSUE REQUEST Hart Rochester, MD 05/16/2020 [id number]  2 : LEFT INGUINAL Tissue Lymph Node PATHOLOGY TISSUE REQUEST Hart Rochester, MD 05/16/2020 [phone]    Findings:  Bilateral inguinal nodes c/w metastatic disease. On exam under anesthesia, the patient was noted to have a large pedunculated mass emanating from the left labia. This was fairly mobile and did not appear to be deeply invasive. Further evaluation of this pedunculated mass revealed that there was disease crossing the perineal body extending up to the right labia and into the vaginal introitus. This was along the area of prior surgical resection. This did not appear to extend to the urethral meatus but full visualization of this was somewhat difficult given the bulkiness of the tumor and the friability.  It was not felt that this was resectable based on the extent of disease and size of the lesion.    05/16/2020 Pathology Results   1.  Right inguinal lymph node, resection:      -Two lymph nodes, negative for carcinoma (0/2). 2.  Left inguinal lymph node, resection:      -Three lymph nodes, negative for carcinoma (0/3).   06/06/2020 Cancer Staging   Staging form: Vulva, AJCC 8th Edition - Clinical stage from 06/06/2020: Stage Unknown (rcTX, cN0, cM0) - Signed  by Heath Lark, MD on 06/06/2020   06/13/2020 Procedure   Technically successful right IJ power-injectable port catheter placement. Ready for routine use   06/19/2020 -  Chemotherapy   The patient had palonosetron (ALOXI) injection 0.25 mg, 0.25 mg, Intravenous,  Once, 0 of 5 cycles CISplatin (PLATINOL) 63 mg in sodium chloride 0.9 % 250 mL chemo infusion, 40 mg/m2 = 63 mg, Intravenous,  Once, 0 of 5 cycles fosaprepitant (EMEND) 150 mg in sodium chloride 0.9 % 145 mL IVPB, 150 mg, Intravenous,  Once, 0 of 5 cycles  for chemotherapy treatment.      REVIEW OF SYSTEMS:   Constitutional: Denies fevers, chills  Eyes: Denies blurriness of vision Ears, nose, mouth, throat, and face: Denies mucositis or sore throat Respiratory: Denies cough, dyspnea or wheezes Cardiovascular: Denies palpitation, chest discomfort or lower extremity swelling Gastrointestinal:  Denies nausea, heartburn or change in bowel habits Skin: Denies abnormal skin rashes Lymphatics: Denies new lymphadenopathy or easy bruising Neurological:Denies numbness, tingling or new weaknesses Behavioral/Psych: Mood is stable, no new changes  All other systems were reviewed with the patient and are negative.  I have reviewed the past medical history, past surgical history, social history and family history with the patient and they are unchanged from previous note.  ALLERGIES:  is allergic to duloxetine.  MEDICATIONS:  Current Outpatient Medications  Medication Sig Dispense Refill  . Cyanocobalamin (VITAMIN B 12 PO) Take 1,000 mcg by mouth daily.    Marland Kitchen donepezil (ARICEPT) 10 MG tablet Take 10 mg by mouth at bedtime.    . ergocalciferol (VITAMIN D2) 50000 UNITS capsule Take 50,000 Units by mouth every Tuesday.     . flecainide (TAMBOCOR) 50 MG tablet Take 1 tablet (50 mg total) by mouth 2 (two) times daily. 180 tablet 3  . HYDROcodone-acetaminophen (NORCO/VICODIN) 5-325 MG tablet Take 1 tablet by mouth every 6 (six) hours as needed.  30 tablet 0  . lidocaine-prilocaine (EMLA) cream Apply to affected area once 30 g 3  . metoprolol tartrate (LOPRESSOR) 25 MG tablet Take 1 tablet (25 mg total) by mouth 2 (two) times daily. 180 tablet 2  . mirtazapine (REMERON) 15 MG tablet Take 1 tablet (15 mg total) by mouth at bedtime. 30 tablet 11  . Omega-3 Fatty Acids (FISH OIL) 1200 MG CAPS Take 2 capsules by mouth every morning.    . ondansetron (ZOFRAN) 8 MG tablet Take 1 tablet (8 mg total) by mouth every 8 (eight) hours as needed. Start on the third day after cisplatin chemotherapy. 30 tablet 1  . prochlorperazine (COMPAZINE) 10 MG tablet Take 1 tablet (10 mg total) by mouth every 6 (six) hours as needed (Nausea or vomiting). 30 tablet 1  . sertraline (ZOLOFT) 100 MG tablet Take 1 tablet (100 mg total) by mouth daily. 90 tablet 3   No current facility-administered medications for this visit.    PHYSICAL EXAMINATION: ECOG PERFORMANCE STATUS: 1 - Symptomatic but completely ambulatory  Vitals:  06/15/20 1244  BP: 133/86  Pulse: 88  Resp: 18  Temp: 99.3 F (37.4 C)  SpO2: 100%   Filed Weights   06/15/20 1244  Weight: 128 lb 9.6 oz (58.3 kg)    GENERAL:alert, no distress and comfortable SKIN: skin color, texture, turgor are normal, no rashes or significant lesions.  Minor bruises are noted under the skin at the port site but there is persistent nonhealing wound on the right groin EYES: normal, Conjunctiva are pink and non-injected, sclera clear OROPHARYNX:no exudate, no erythema and lips, buccal mucosa, and tongue normal  NECK: supple, thyroid normal size, non-tender, without nodularity LYMPH:  no palpable lymphadenopathy in the cervical, axillary or inguinal LUNGS: clear to auscultation and percussion with normal breathing effort HEART: regular rate & rhythm and no murmurs and no lower extremity edema ABDOMEN:abdomen soft, non-tender and normal bowel sounds Musculoskeletal:no cyanosis of digits and no clubbing  NEURO:  alert & oriented x 3 with fluent speech, no focal motor/sensory deficits Noted vulval lesion, no bleeding seen  LABORATORY DATA:  I have reviewed the data as listed    Component Value Date/Time   NA 141 06/13/2020 1345   NA 139 09/01/2018 1550   NA 138 11/22/2013 1447   K 3.9 06/13/2020 1345   K 4.2 11/22/2013 1447   CL 98 06/13/2020 1345   CL 104 11/03/2012 1419   CO2 26 06/13/2020 1345   CO2 28 11/22/2013 1447   GLUCOSE 96 06/13/2020 1345   GLUCOSE 319 (H) 11/22/2013 1447   GLUCOSE 119 (H) 11/03/2012 1419   BUN 18 06/13/2020 1345   BUN 13 09/01/2018 1550   BUN 11.5 11/22/2013 1447   CREATININE 0.87 06/13/2020 1345   CREATININE 0.8 11/22/2013 1447   CALCIUM 10.4 (H) 06/13/2020 1345   CALCIUM 10.0 11/22/2013 1447   PROT 8.3 (H) 06/13/2020 1345   PROT 6.9 11/22/2013 1447   ALBUMIN 3.8 06/13/2020 1345   ALBUMIN 3.5 11/22/2013 1447   AST 16 06/13/2020 1345   AST 11 11/22/2013 1447   ALT 9 06/13/2020 1345   ALT 13 11/22/2013 1447   ALKPHOS 66 06/13/2020 1345   ALKPHOS 94 11/22/2013 1447   BILITOT 0.6 06/13/2020 1345   BILITOT 0.20 11/22/2013 1447   GFRNONAA >60 06/13/2020 1345   GFRAA >60 06/13/2020 1345    No results found for: SPEP, UPEP  Lab Results  Component Value Date   WBC 11.5 (H) 06/13/2020   NEUTROABS 8.2 (H) 06/13/2020   HGB 11.2 (L) 06/13/2020   HCT 35.4 (L) 06/13/2020   MCV 88.5 06/13/2020   PLT 272 06/13/2020      Chemistry      Component Value Date/Time   NA 141 06/13/2020 1345   NA 139 09/01/2018 1550   NA 138 11/22/2013 1447   K 3.9 06/13/2020 1345   K 4.2 11/22/2013 1447   CL 98 06/13/2020 1345   CL 104 11/03/2012 1419   CO2 26 06/13/2020 1345   CO2 28 11/22/2013 1447   BUN 18 06/13/2020 1345   BUN 13 09/01/2018 1550   BUN 11.5 11/22/2013 1447   CREATININE 0.87 06/13/2020 1345   CREATININE 0.8 11/22/2013 1447      Component Value Date/Time   CALCIUM 10.4 (H) 06/13/2020 1345   CALCIUM 10.0 11/22/2013 1447   ALKPHOS 66 06/13/2020  1345   ALKPHOS 94 11/22/2013 1447   AST 16 06/13/2020 1345   AST 11 11/22/2013 1447   ALT 9 06/13/2020 1345   ALT 13 11/22/2013  1447   BILITOT 0.6 06/13/2020 1345   BILITOT 0.20 11/22/2013 1447

## 2020-06-15 NOTE — Assessment & Plan Note (Signed)
We discussed the role of chemotherapy. The intent is of curative intent.  We discussed some of the risks, benefits, side-effects of cisplatin and its role as chemo sensitizing agent. The plan for weekly cisplatin for x5 doses along with radiation treatment.  Some of the short term side-effects included, though not limited to, including weight loss, life threatening infections, risk of allergic reactions, need for transfusions of blood products, nausea, vomiting, change in bowel habits, loss of hair, admission to hospital for various reasons, and risks of death.   Long term side-effects are also discussed including risks of infertility, permanent damage to nerve function, hearing loss, chronic fatigue, kidney damage with possibility needing hemodialysis, and rare secondary malignancy including bone marrow disorders.  The patient is aware that the response rates discussed earlier is not guaranteed.  After a long discussion, patient made an informed decision to proceed with the prescribed plan of care.   Patient education material was dispensed.   

## 2020-06-15 NOTE — Assessment & Plan Note (Signed)
Her port site is healing well However, the surgical drain exit site on the right groin is still not healed I placed a Band-Aid on it I do not believe this will delay treatment necessarily I plan to check it again next week

## 2020-06-15 NOTE — Progress Notes (Signed)
Met with patient and son by elevators after chemo class to introduce myself as Arboriculturist and to offer available resources.  Discussed one-time $1000 Radio broadcast assistant to assist with personal expenses while going through treatment.  Gave her my card if interested in applying and for any additional financial questions or concerns. Based on verbal income guidelines, they state they probably won't qualify. Advised of the transportation program available if needed which she mentioned transportation/cost. They verbalized understanding.

## 2020-06-15 NOTE — Assessment & Plan Note (Signed)
She continues to have progressive weight loss We discussed the importance of frequent small meals I will start her on Remeron to see if we can help improve her appetite

## 2020-06-15 NOTE — Assessment & Plan Note (Signed)
I refill her prescription pain medicine We discussed narcotic refill policy I warned her about risk of nausea and constipation

## 2020-06-16 ENCOUNTER — Other Ambulatory Visit: Payer: Self-pay

## 2020-06-16 ENCOUNTER — Ambulatory Visit
Admission: RE | Admit: 2020-06-16 | Discharge: 2020-06-16 | Disposition: A | Discharge status: Home / Self Care | Payer: Medicare PPO | Source: Ambulatory Visit | Attending: Radiation Oncology | Admitting: Radiation Oncology

## 2020-06-16 DIAGNOSIS — C518 Malignant neoplasm of overlapping sites of vulva: Secondary | ICD-10-CM | POA: Diagnosis not present

## 2020-06-16 DIAGNOSIS — Z510 Encounter for antineoplastic radiation therapy: Secondary | ICD-10-CM | POA: Diagnosis not present

## 2020-06-16 DIAGNOSIS — C519 Malignant neoplasm of vulva, unspecified: Secondary | ICD-10-CM | POA: Diagnosis not present

## 2020-06-19 ENCOUNTER — Other Ambulatory Visit: Payer: Self-pay

## 2020-06-19 ENCOUNTER — Inpatient Hospital Stay: Payer: Medicare PPO

## 2020-06-19 ENCOUNTER — Ambulatory Visit
Admission: RE | Admit: 2020-06-19 | Discharge: 2020-06-19 | Disposition: A | Discharge status: Home / Self Care | Payer: Medicare PPO | Source: Ambulatory Visit | Attending: Radiation Oncology | Admitting: Radiation Oncology

## 2020-06-19 DIAGNOSIS — Z510 Encounter for antineoplastic radiation therapy: Secondary | ICD-10-CM | POA: Diagnosis not present

## 2020-06-19 DIAGNOSIS — C519 Malignant neoplasm of vulva, unspecified: Secondary | ICD-10-CM | POA: Diagnosis not present

## 2020-06-19 DIAGNOSIS — C518 Malignant neoplasm of overlapping sites of vulva: Secondary | ICD-10-CM | POA: Diagnosis not present

## 2020-06-20 ENCOUNTER — Encounter: Payer: Self-pay | Admitting: Oncology

## 2020-06-20 ENCOUNTER — Ambulatory Visit
Admission: RE | Admit: 2020-06-20 | Discharge: 2020-06-20 | Disposition: A | Discharge status: Home / Self Care | Payer: Medicare PPO | Source: Ambulatory Visit | Attending: Radiation Oncology | Admitting: Radiation Oncology

## 2020-06-20 ENCOUNTER — Other Ambulatory Visit: Payer: Self-pay

## 2020-06-20 ENCOUNTER — Telehealth: Payer: Self-pay | Admitting: Oncology

## 2020-06-20 ENCOUNTER — Encounter: Payer: Self-pay | Admitting: Psychology

## 2020-06-20 ENCOUNTER — Inpatient Hospital Stay: Payer: Medicare PPO

## 2020-06-20 VITALS — BP 126/49 | HR 64 | Temp 98.4°F | Resp 18

## 2020-06-20 DIAGNOSIS — Z510 Encounter for antineoplastic radiation therapy: Secondary | ICD-10-CM | POA: Diagnosis not present

## 2020-06-20 DIAGNOSIS — C519 Malignant neoplasm of vulva, unspecified: Secondary | ICD-10-CM | POA: Diagnosis not present

## 2020-06-20 DIAGNOSIS — E119 Type 2 diabetes mellitus without complications: Secondary | ICD-10-CM | POA: Diagnosis not present

## 2020-06-20 DIAGNOSIS — Z5111 Encounter for antineoplastic chemotherapy: Secondary | ICD-10-CM | POA: Diagnosis not present

## 2020-06-20 DIAGNOSIS — R413 Other amnesia: Secondary | ICD-10-CM | POA: Diagnosis not present

## 2020-06-20 DIAGNOSIS — Z9221 Personal history of antineoplastic chemotherapy: Secondary | ICD-10-CM | POA: Diagnosis not present

## 2020-06-20 DIAGNOSIS — Z7189 Other specified counseling: Secondary | ICD-10-CM

## 2020-06-20 DIAGNOSIS — C518 Malignant neoplasm of overlapping sites of vulva: Secondary | ICD-10-CM | POA: Diagnosis not present

## 2020-06-20 DIAGNOSIS — R634 Abnormal weight loss: Secondary | ICD-10-CM | POA: Diagnosis not present

## 2020-06-20 DIAGNOSIS — Z853 Personal history of malignant neoplasm of breast: Secondary | ICD-10-CM | POA: Diagnosis not present

## 2020-06-20 DIAGNOSIS — I442 Atrioventricular block, complete: Secondary | ICD-10-CM | POA: Diagnosis not present

## 2020-06-20 DIAGNOSIS — Z923 Personal history of irradiation: Secondary | ICD-10-CM | POA: Diagnosis not present

## 2020-06-20 MED ORDER — HEPARIN SOD (PORK) LOCK FLUSH 100 UNIT/ML IV SOLN
500.0000 [IU] | Freq: Once | INTRAVENOUS | Status: AC | PRN
  Administered 2020-06-20: 500 [IU]
  Filled 2020-06-20: qty 5

## 2020-06-20 MED ORDER — SODIUM CHLORIDE 0.9 % IV SOLN
Freq: Once | INTRAVENOUS | Status: AC
  Filled 2020-06-20: qty 10

## 2020-06-20 MED ORDER — SODIUM CHLORIDE 0.9 % IV SOLN
INTRAVENOUS | Status: AC
  Filled 2020-06-20 (×2): qty 250

## 2020-06-20 MED ORDER — SODIUM CHLORIDE 0.9 % IV SOLN
Freq: Once | INTRAVENOUS | Status: AC
  Filled 2020-06-20: qty 250

## 2020-06-20 MED ORDER — SODIUM CHLORIDE 0.9 % IV SOLN
150.0000 mg | Freq: Once | INTRAVENOUS | Status: AC
  Administered 2020-06-20: 150 mg via INTRAVENOUS
  Filled 2020-06-20: qty 150

## 2020-06-20 MED ORDER — PALONOSETRON HCL INJECTION 0.25 MG/5ML
0.2500 mg | Freq: Once | INTRAVENOUS | Status: AC
  Administered 2020-06-20: 0.25 mg via INTRAVENOUS

## 2020-06-20 MED ORDER — SODIUM CHLORIDE 0.9 % IV SOLN
40.0000 mg/m2 | Freq: Once | INTRAVENOUS | Status: AC
  Administered 2020-06-20: 63 mg via INTRAVENOUS
  Filled 2020-06-20: qty 63

## 2020-06-20 MED ORDER — SODIUM CHLORIDE 0.9 % IV SOLN
10.0000 mg | Freq: Once | INTRAVENOUS | Status: AC
  Administered 2020-06-20: 10 mg via INTRAVENOUS
  Filled 2020-06-20: qty 10

## 2020-06-20 MED ORDER — PALONOSETRON HCL INJECTION 0.25 MG/5ML
INTRAVENOUS | Status: AC
  Filled 2020-06-20: qty 5

## 2020-06-20 MED ORDER — SODIUM CHLORIDE 0.9% FLUSH
10.0000 mL | INTRAVENOUS | Status: DC | PRN
  Administered 2020-06-20: 10 mL
  Filled 2020-06-20: qty 10

## 2020-06-20 NOTE — Progress Notes (Signed)
Patient states she is unable to urinate. States this is her normal, and that she " only urinates once or twice a day". Patient has been given both PO and IV hydration. Dr. Alvy Bimler made aware. Per Dr. Alvy Bimler, ok to proceed with treatment today. Patient is to continue PO intake during treatment and also when she goes home.  Patient educated on the importance of hydration.    1530--Patient has not urinated yet today. Patient states that she normally only urinates once or twice a day. This was confirmed with patient's son,[name]. When asking patient if she will try to urinate, patient says "no because I know what they feeling is and I don't have that right now".  Patient states she is not wearing depends but does have a pad and states "it's not wet". Dr. Alvy Bimler made aware by Hassan Rowan, RN. IV fluid order received, and fluids administered.

## 2020-06-20 NOTE — Progress Notes (Signed)
Met with [name] in the infusion room for her first chemotherapy.  Went over her upcoming schedule for chemo and radiation.  Encouraged her to call with any questions.

## 2020-06-20 NOTE — Telephone Encounter (Signed)
Called patient's son regarding appointment this morning. He said he has overslept and will be able to get her here in 20 minutes.

## 2020-06-20 NOTE — Progress Notes (Signed)
Today I provided feedback to the patient and family member regarding the results of the recent neuropsychological evaluation.  The patient was able to participate in this feedback but did clearly have ongoing cognitive deficits.  Below you will find a copy of the summary from that formal neuropsychological evaluation that can be found in its entirety dated 05/25/2020.    Clinical Impressions: [name] is a 74 year old, right-handed, African American, female with 16 years of education who is currently functioning in the mildly impaired to below average range of general intellectual abilities (FSIQ=74, 4th percentile; GAI=80, 9th percentile) with verbal abilities in the borderline impaired range and below average perceptual reasoning skills (VCI =78, 7th percentile; PRI = 86, 18th percentile).  She scored in the average range on measures considered to be good estimates of premorbid intellectual functioning (i.e., Vocabulary subtest).  These scores, when considered with her educational and occupational history, suggest a significant decline in intellectual abilities.  Results of the current cognitive evaluation reveal several areas of impairment, including visual and auditory working memory, information processing speed, verbal reasoning and general fund of knowledge, semantic retrieval (i.e., semantic fluency and confrontation naming), and verbal and non-verbal memory. In contrast, vocabulary knowledge, visual perception and visuospatial construction were average. She was also able to recognize verbal and nonverbal details from initial presentation and learning trials of such tests. There is evidence that her cognitive deficits are interfering with her ability to manage complex tasks, such as managing finances and cooking. As such, diagnostic criteria for a dementia syndrome are met. 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 The fact that most areas of relative decline relate to overall information processing speed and working memory provide some support for a prominent impact from chronic white matter disease and other ischemic changes. However, given her relative decline in aspects of verbal and general fund of knowledge, semantic retrieval, as well as general memory decline, a cortical dementia such as alzheimer's disease cannot be ruled out at this time, especially given family history (e.g., mother was reportedly diagnosed with AD). This is generally consistent with her son's recent report that he believes her memory and adaptive problems have progressively worsened over the last two years.

## 2020-06-20 NOTE — Progress Notes (Signed)
1715. Pt. still has not voided, encouraged pt. to try and void before she left, but pt. Declined.  States she can't just sit there and she doesn't feel like she needs to go. Instructed pt. to call if she does not void this evening. Pt. states she understands.

## 2020-06-20 NOTE — Patient Instructions (Signed)
[facility] Discharge Instructions for Patients Receiving Chemotherapy  Today you received the following chemotherapy agents: cisplatin.  To help prevent nausea and vomiting after your treatment, we encourage you to take your nausea medication as prescribed by your physician.    If you develop nausea and vomiting that is not controlled by your nausea medication, call the clinic.   BELOW ARE SYMPTOMS THAT SHOULD BE REPORTED IMMEDIATELY:  *FEVER GREATER THAN 100.5 F  *CHILLS WITH OR WITHOUT FEVER  NAUSEA AND VOMITING THAT IS NOT CONTROLLED WITH YOUR NAUSEA MEDICATION  *UNUSUAL SHORTNESS OF BREATH  *UNUSUAL BRUISING OR BLEEDING  TENDERNESS IN MOUTH AND THROAT WITH OR WITHOUT PRESENCE OF ULCERS  *URINARY PROBLEMS  *BOWEL PROBLEMS  UNUSUAL RASH Items with * indicate a potential emergency and should be followed up as soon as possible.  Feel free to call the clinic should you have any questions or concerns. The clinic phone number is [phone].  Please show the CHEMO ALERT CARD at check-in to the Emergency Department and triage nurse.  Cisplatin injection What is this medicine? CISPLATIN (SIS pla tin) is a chemotherapy drug. It targets fast dividing cells, like cancer cells, and causes these cells to die. This medicine is used to treat many types of cancer like bladder, ovarian, and testicular cancers. This medicine may be used for other purposes; ask your health care provider or pharmacist if you have questions. COMMON BRAND NAME(S): Platinol, Platinol -AQ What should I tell my health care provider before I take this medicine? They need to know if you have any of these conditions:  eye disease, vision problems  hearing problems  kidney disease  low blood counts, like white cells, platelets, or red blood cells  tingling of the fingers or toes, or other nerve disorder  an unusual or allergic reaction to cisplatin, carboplatin, oxaliplatin, other  medicines, foods, dyes, or preservatives  pregnant or trying to get pregnant  breast-feeding How should I use this medicine? This drug is given as an infusion into a vein. It is administered in a hospital or clinic by a specially trained health care professional. Talk to your pediatrician regarding the use of this medicine in children. Special care may be needed. Overdosage: If you think you have taken too much of this medicine contact a poison control center or emergency room at once. NOTE: This medicine is only for you. Do not share this medicine with others. What if I miss a dose? It is important not to miss a dose. Call your doctor or health care professional if you are unable to keep an appointment. What may interact with this medicine? This medicine may interact with the following medications:  foscarnet  certain antibiotics like amikacin, gentamicin, neomycin, polymyxin B, streptomycin, tobramycin, vancomycin This list may not describe all possible interactions. Give your health care provider a list of all the medicines, herbs, non-prescription drugs, or dietary supplements you use. Also tell them if you smoke, drink alcohol, or use illegal drugs. Some items may interact with your medicine. What should I watch for while using this medicine? Your condition will be monitored carefully while you are receiving this medicine. You will need important blood work done while you are taking this medicine. This drug may make you feel generally unwell. This is not uncommon, as chemotherapy can affect healthy cells as well as cancer cells. Report any side effects. Continue your course of treatment even though you feel ill unless your doctor tells you to stop. This   medicine may increase your risk of getting an infection. Call your healthcare professional for advice if you get a fever, chills, or sore throat, or other symptoms of a cold or flu. Do not treat yourself. Try to avoid being around people  who are sick. Avoid taking medicines that contain aspirin, acetaminophen, ibuprofen, naproxen, or ketoprofen unless instructed by your healthcare professional. These medicines may hide a fever. This medicine may increase your risk to bruise or bleed. Call your doctor or health care professional if you notice any unusual bleeding. Be careful brushing and flossing your teeth or using a toothpick because you may get an infection or bleed more easily. If you have any dental work done, tell your dentist you are receiving this medicine. Do not become pregnant while taking this medicine or for 14 months after stopping it. Women should inform their healthcare professional if they wish to become pregnant or think they might be pregnant. Men should not father a child while taking this medicine and for 11 months after stopping it. There is potential for serious side effects to an unborn child. Talk to your healthcare professional for more information. Do not breast-feed an infant while taking this medicine. This medicine has caused ovarian failure in some women. This medicine may make it more difficult to get pregnant. Talk to your healthcare professional if you are concerned about your fertility. This medicine has caused decreased sperm counts in some men. This may make it more difficult to father a child. Talk to your healthcare professional if you are concerned about your fertility. Drink fluids as directed while you are taking this medicine. This will help protect your kidneys. Call your doctor or health care professional if you get diarrhea. Do not treat yourself. What side effects may I notice from receiving this medicine? Side effects that you should report to your doctor or health care professional as soon as possible:  allergic reactions like skin rash, itching or hives, swelling of the face, lips, or tongue  blurred vision  changes in vision  decreased hearing or ringing of the ears  nausea,  vomiting  pain, redness, or irritation at site where injected  pain, tingling, numbness in the hands or feet  signs and symptoms of bleeding such as bloody or black, tarry stools; red or dark brown urine; spitting up blood or brown material that looks like coffee grounds; red spots on the skin; unusual bruising or bleeding from the eyes, gums, or nose  signs and symptoms of infection like fever; chills; cough; sore throat; pain or trouble passing urine  signs and symptoms of kidney injury like trouble passing urine or change in the amount of urine  signs and symptoms of low red blood cells or anemia such as unusually weak or tired; feeling faint or lightheaded; falls; breathing problems Side effects that usually do not require medical attention (report to your doctor or health care professional if they continue or are bothersome):  loss of appetite  mouth sores  muscle cramps This list may not describe all possible side effects. Call your doctor for medical advice about side effects. You may report side effects to FDA at 1-800-FDA-1088. Where should I keep my medicine? This drug is given in a hospital or clinic and will not be stored at home. NOTE: This sheet is a summary. It may not cover all possible information. If you have questions about this medicine, talk to your doctor, pharmacist, or health care provider.  2020 Elsevier/Gold Standard (2018-09-04 15:59:17)  

## 2020-06-21 ENCOUNTER — Ambulatory Visit
Admission: RE | Admit: 2020-06-21 | Discharge: 2020-06-21 | Disposition: A | Discharge status: Home / Self Care | Payer: Medicare PPO | Source: Ambulatory Visit | Attending: Radiation Oncology | Admitting: Radiation Oncology

## 2020-06-21 ENCOUNTER — Other Ambulatory Visit: Payer: Self-pay

## 2020-06-21 ENCOUNTER — Inpatient Hospital Stay: Payer: Medicare PPO

## 2020-06-21 DIAGNOSIS — R413 Other amnesia: Secondary | ICD-10-CM | POA: Diagnosis not present

## 2020-06-21 DIAGNOSIS — R634 Abnormal weight loss: Secondary | ICD-10-CM | POA: Diagnosis not present

## 2020-06-21 DIAGNOSIS — Z853 Personal history of malignant neoplasm of breast: Secondary | ICD-10-CM | POA: Diagnosis not present

## 2020-06-21 DIAGNOSIS — Z923 Personal history of irradiation: Secondary | ICD-10-CM | POA: Diagnosis not present

## 2020-06-21 DIAGNOSIS — I442 Atrioventricular block, complete: Secondary | ICD-10-CM | POA: Diagnosis not present

## 2020-06-21 DIAGNOSIS — E119 Type 2 diabetes mellitus without complications: Secondary | ICD-10-CM | POA: Diagnosis not present

## 2020-06-21 DIAGNOSIS — C518 Malignant neoplasm of overlapping sites of vulva: Secondary | ICD-10-CM | POA: Diagnosis not present

## 2020-06-21 DIAGNOSIS — Z510 Encounter for antineoplastic radiation therapy: Secondary | ICD-10-CM | POA: Diagnosis not present

## 2020-06-21 DIAGNOSIS — Z9221 Personal history of antineoplastic chemotherapy: Secondary | ICD-10-CM | POA: Diagnosis not present

## 2020-06-21 DIAGNOSIS — Z5111 Encounter for antineoplastic chemotherapy: Secondary | ICD-10-CM | POA: Diagnosis not present

## 2020-06-21 DIAGNOSIS — C519 Malignant neoplasm of vulva, unspecified: Secondary | ICD-10-CM | POA: Diagnosis not present

## 2020-06-21 DIAGNOSIS — Z7189 Other specified counseling: Secondary | ICD-10-CM

## 2020-06-21 LAB — CBC WITH DIFFERENTIAL (CANCER CENTER ONLY)
Abs Immature Granulocytes: 0.06 10*3/uL (ref 0.00–0.07)
Basophils Absolute: 0 10*3/uL (ref 0.0–0.1)
Basophils Relative: 0 %
Eosinophils Absolute: 0 10*3/uL (ref 0.0–0.5)
Eosinophils Relative: 0 %
HCT: 28.7 % — ABNORMAL LOW (ref 36.0–46.0)
Hemoglobin: 9.1 g/dL — ABNORMAL LOW (ref 12.0–15.0)
Immature Granulocytes: 1 %
Lymphocytes Relative: 9 %
Lymphs Abs: 0.9 10*3/uL (ref 0.7–4.0)
MCH: 27.5 pg (ref 26.0–34.0)
MCHC: 31.7 g/dL (ref 30.0–36.0)
MCV: 86.7 fL (ref 80.0–100.0)
Monocytes Absolute: 0.5 10*3/uL (ref 0.1–1.0)
Monocytes Relative: 5 %
Neutro Abs: 9.2 10*3/uL — ABNORMAL HIGH (ref 1.7–7.7)
Neutrophils Relative %: 85 %
Platelet Count: 226 10*3/uL (ref 150–400)
RBC: 3.31 MIL/uL — ABNORMAL LOW (ref 3.87–5.11)
RDW: 13.9 % (ref 11.5–15.5)
WBC Count: 10.7 10*3/uL — ABNORMAL HIGH (ref 4.0–10.5)
nRBC: 0 % (ref 0.0–0.2)

## 2020-06-21 LAB — BASIC METABOLIC PANEL - CANCER CENTER ONLY
Anion gap: 9 (ref 5–15)
BUN: 13 mg/dL (ref 8–23)
CO2: 24 mmol/L (ref 22–32)
Calcium: 9.4 mg/dL (ref 8.9–10.3)
Chloride: 106 mmol/L (ref 98–111)
Creatinine: 0.83 mg/dL (ref 0.44–1.00)
GFR, Est AFR Am: >60 mL/min (ref >60)
GFR, Estimated: >60 mL/min (ref >60)
Glucose, Bld: 159 mg/dL — ABNORMAL HIGH (ref 70–99)
Potassium: 3.9 mmol/L (ref 3.5–5.1)
Sodium: 139 mmol/L (ref 135–145)

## 2020-06-21 LAB — MAGNESIUM: Magnesium: 2.3 mg/dL (ref 1.7–2.4)

## 2020-06-22 ENCOUNTER — Inpatient Hospital Stay (HOSPITAL_BASED_OUTPATIENT_CLINIC_OR_DEPARTMENT_OTHER): Payer: Medicare PPO | Admitting: Hematology and Oncology

## 2020-06-22 ENCOUNTER — Encounter: Payer: Self-pay | Admitting: Hematology and Oncology

## 2020-06-22 ENCOUNTER — Ambulatory Visit
Admission: RE | Admit: 2020-06-22 | Discharge: 2020-06-22 | Disposition: A | Discharge status: Home / Self Care | Payer: Medicare PPO | Source: Ambulatory Visit | Attending: Radiation Oncology | Admitting: Radiation Oncology

## 2020-06-22 DIAGNOSIS — G893 Neoplasm related pain (acute) (chronic): Secondary | ICD-10-CM | POA: Diagnosis not present

## 2020-06-22 DIAGNOSIS — R634 Abnormal weight loss: Secondary | ICD-10-CM | POA: Diagnosis not present

## 2020-06-22 DIAGNOSIS — E119 Type 2 diabetes mellitus without complications: Secondary | ICD-10-CM | POA: Diagnosis not present

## 2020-06-22 DIAGNOSIS — D6481 Anemia due to antineoplastic chemotherapy: Secondary | ICD-10-CM | POA: Diagnosis not present

## 2020-06-22 DIAGNOSIS — T451X5A Adverse effect of antineoplastic and immunosuppressive drugs, initial encounter: Secondary | ICD-10-CM | POA: Diagnosis not present

## 2020-06-22 DIAGNOSIS — Z5111 Encounter for antineoplastic chemotherapy: Secondary | ICD-10-CM | POA: Diagnosis not present

## 2020-06-22 DIAGNOSIS — C519 Malignant neoplasm of vulva, unspecified: Secondary | ICD-10-CM | POA: Diagnosis not present

## 2020-06-22 DIAGNOSIS — Z510 Encounter for antineoplastic radiation therapy: Secondary | ICD-10-CM | POA: Diagnosis not present

## 2020-06-22 DIAGNOSIS — Z853 Personal history of malignant neoplasm of breast: Secondary | ICD-10-CM | POA: Diagnosis not present

## 2020-06-22 DIAGNOSIS — I442 Atrioventricular block, complete: Secondary | ICD-10-CM | POA: Diagnosis not present

## 2020-06-22 DIAGNOSIS — Z9221 Personal history of antineoplastic chemotherapy: Secondary | ICD-10-CM | POA: Diagnosis not present

## 2020-06-22 DIAGNOSIS — R413 Other amnesia: Secondary | ICD-10-CM

## 2020-06-22 DIAGNOSIS — Z923 Personal history of irradiation: Secondary | ICD-10-CM | POA: Diagnosis not present

## 2020-06-22 DIAGNOSIS — C518 Malignant neoplasm of overlapping sites of vulva: Secondary | ICD-10-CM | POA: Diagnosis not present

## 2020-06-22 NOTE — Assessment & Plan Note (Signed)
We discussed narcotic refill policy I warned her about risk of nausea and constipation

## 2020-06-22 NOTE — Assessment & Plan Note (Signed)
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

## 2020-06-22 NOTE — Assessment & Plan Note (Signed)
She has mild dementia I am concerned about her difficulties following instruction during infusion and lack of cooperation with nursing staff during treatment The patient was grossly dehydrated when she presented for IV fluids She struggled to drink 1-2 bottles of water per day because she has no desire to eat or drink I spent a lot of time educating the patient and her son the importance of increasing oral fluid intake, at least 60 to 80 ounces of water per day The patient also need to be compliant with following instruction with nursing staff during infusion If she continues to have difficulties tolerating oral intake, I recommend she contact me and we can also schedule IV fluid for additional support

## 2020-06-22 NOTE — Assessment & Plan Note (Signed)
She tolerated chemotherapy fairly well except for reduced urine output despite aggressive IV fluid resuscitation and persistent pelvic pain She will continue treatment as scheduled and I will see her again next week for further follow-up

## 2020-06-22 NOTE — Progress Notes (Signed)
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 and side effects of treatment She is not symptomatic Observe  Memory loss She has mild dementia I am concerned about her difficulties following instruction during infusion and lack of cooperation with nursing staff during treatment The patient was grossly dehydrated when she presented for IV fluids She struggled to drink 1-2 bottles of water per day because she has no desire to eat or drink I spent a lot of time educating the patient and her son the importance of increasing oral fluid intake, at least 60 to 80 ounces of water per day The patient also need to be compliant with following instruction with nursing staff during infusion If she continues to have difficulties tolerating oral intake, I recommend she contact me and we can also schedule IV fluid for additional support   No orders of the defined types were placed in this encounter.   All questions were answered. The patient knows to call the clinic with any problems, questions or concerns. The total time spent in the appointment was 20 minutes encounter with patients including review of chart and various tests results, discussions about plan of care and coordination of care plan   Heath Lark, MD 06/22/2020 3:09 PM  INTERVAL HISTORY: Please see below for problem oriented charting. 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 Freddi Che), multiple vulvar re-excision  Surgeon: Margaretmary Bayley, MD; Andi Devon MD  Findings: Bilateral sentinel LN identified, both hot and blue. See Dr. Bethel Born note for full details. Posterior vulva with evidence of partial wound breakdown without evidence of infection. Granulation tissue noted with exposed subcutaneous sutures. 2x2cm concerning nodular plaque at 7 o'clock, medial margin <1cm from anus. Rectal exam at conclusion normal.   Specimens: 1. Left vulvar margin 2. Right vulvar margin 3. Left sentinel inguinal LN 4. Right sentinel inguinal LN 5. Vulvar lesion at 7 oclock, stitch at 12 oclock  03/04/2016 Pathology Results   A: Lymph node, right inguinal sentinel #2, removal - 1 lymph node negative for metastatic carcinoma (0/1). - Pan cytokeratin confirmatory (negative).   B: Lymph node, right inguinal sentinel #1, removal  -  2 lymph nodes, negative for metastatic carcinoma (0/2). - Pan cytokeratin confirmatory (negative).   C: Lymph node, left inguinal sentinel #1, removal  - 1 lymph node negative for metastatic carcinoma (0/1). - Pan cytokeratin confirmatory (negative).   D: Vulva, left margin, biopsy  - Ulcerated squamous tissue and granulation tissue. - Acute inflammation and suture granuloma. - No dysplasia or malignancy identified.   E: Vulva, right margin, biopsy  - Skin and subcutaneous tissue with fat necrosis, ulceration, fibrosis and granulation tissue. - No dysplasia or malignancy identified.   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 - Margins for invasive carcinoma are negative (deep 4 mm, 3 o'clock half 5 mm and 9 o'clock half 7 mm) - Margins for HSIL:  3-o'clock half positive for VIN 2. - Other findings: Fat necrosis, fibrosis and chronic inflammation   04/11/2020 Pathology Results   1. Vulva, biopsy, top - AT LEAST SQUAMOUS CELL CARCINOMA IN SITU, SUSPICIOUS FOR INVASION. SEE NOTE 2. Vulva, biopsy, middle - SQUAMOUS CARCINOMA IN SITU. SEE NOTE 3. Vulva, biopsy, bottom - SQUAMOUS CARCINOMA IN SITU. SEE NOTE   05/03/2020 PET scan   Hypermetabolic bilateral vulvar skin thickening max SUV 19, consistent with patient's known malignancy.   Hypermetabolic bilateral inguinal lymph nodes.  Right inguinal node measuring 16 x 25 mm max SUV 6.9  Left inguinal lymph node measuring 12 x 15 mm max SUV 5.3   No other worrisome hypermetabolic activity identified.   Additional findings:  Lobulated bilateral renal cortex suggesting scarring.  Postop change with the left breast.   05/16/2020 Surgery   Surgeon(s) and Role: *  Hart Rochester, MD - Primary  Specimens:  ID Type Source Tests Collected by Time  1 : RIGHT INGUINAL Tissue Lymph Node PATHOLOGY TISSUE REQUEST Hart Rochester, MD 05/16/2020 [phone]  2 : LEFT INGUINAL Tissue Lymph Node PATHOLOGY TISSUE REQUEST Hart Rochester, MD 05/16/2020 [phone]    Findings:  Bilateral inguinal nodes c/w metastatic disease. On exam under anesthesia, the patient was noted to have a large pedunculated mass emanating from the left labia. This was fairly mobile and did not appear to be deeply invasive. Further evaluation of this pedunculated mass revealed that there was disease crossing the perineal body extending up to the right labia and into the vaginal introitus. This was along the area of prior surgical resection. This did not appear to extend to the urethral meatus but full visualization of this was somewhat difficult given the bulkiness of the tumor and the friability. It was not felt that this was resectable based on the extent of disease and size of the lesion.    05/16/2020 Pathology Results   1.  Right inguinal lymph node, resection:      -Two lymph nodes, negative for carcinoma (0/2). 2.  Left inguinal lymph node, resection:      -Three lymph nodes, negative for carcinoma (0/3).   06/06/2020 Cancer Staging   Staging form: Vulva, AJCC 8th Edition - Clinical stage from 06/06/2020: Stage  Unknown (rcTX, cN0, cM0) - Signed by Heath Lark, MD on 06/06/2020   06/13/2020 Procedure   Technically successful right IJ power-injectable port catheter placement. Ready for routine use   06/20/2020 -  Chemotherapy   The patient had palonosetron (ALOXI) injection 0.25 mg, 0.25 mg, Intravenous,  Once, 1 of 5 cycles Administration: 0.25 mg (06/20/2020) CISplatin (PLATINOL) 63 mg in sodium chloride 0.9 % 250 mL chemo infusion, 40 mg/m2 = 63 mg, Intravenous,  Once, 1 of 5 cycles Administration: 63 mg (06/20/2020) fosaprepitant (EMEND) 150 mg in sodium chloride 0.9 % 145 mL IVPB, 150  mg, Intravenous,  Once, 1 of 5 cycles Administration: 150 mg (06/20/2020)  for chemotherapy treatment.      REVIEW OF SYSTEMS:   Constitutional: Denies fevers, chills or abnormal weight loss Eyes: Denies blurriness of vision Ears, nose, mouth, throat, and face: Denies mucositis or sore throat Respiratory: Denies cough, dyspnea or wheezes Cardiovascular: Denies palpitation, chest discomfort or lower extremity swelling Skin: Denies abnormal skin rashes Lymphatics: Denies new lymphadenopathy or easy bruising Neurological:Denies numbness, tingling or new weaknesses Behavioral/Psych: Mood is stable, no new changes  All other systems were reviewed with the patient and are negative.  I have reviewed the past medical history, past surgical history, social history and family history with the patient and they are unchanged from previous note.  ALLERGIES:  is allergic to duloxetine.  MEDICATIONS:  Current Outpatient Medications  Medication Sig Dispense Refill  . Cyanocobalamin (VITAMIN B 12 PO) Take 1,000 mcg by mouth daily.    Marland Kitchen donepezil (ARICEPT) 10 MG tablet Take 10 mg by mouth at bedtime.    . ergocalciferol (VITAMIN D2) 50000 UNITS capsule Take 50,000 Units by mouth every Tuesday.     . flecainide (TAMBOCOR) 50 MG tablet Take 1 tablet (50 mg total) by mouth 2 (two) times daily. 180 tablet 3  . HYDROcodone-acetaminophen (NORCO/VICODIN) 5-325 MG tablet Take 1 tablet by mouth every 6 (six) hours as needed. 30 tablet 0  . lidocaine-prilocaine (EMLA) cream Apply to affected area once 30 g 3  . metoprolol tartrate (LOPRESSOR) 25 MG tablet Take 1 tablet (25 mg total) by mouth 2 (two) times daily. 180 tablet 2  . mirtazapine (REMERON) 15 MG tablet Take 1 tablet (15 mg total) by mouth at bedtime. 30 tablet 11  . Omega-3 Fatty Acids (FISH OIL) 1200 MG CAPS Take 2 capsules by mouth every morning.    . ondansetron (ZOFRAN) 8 MG tablet Take 1 tablet (8 mg total) by mouth every 8 (eight) hours as  needed. Start on the third day after cisplatin chemotherapy. 30 tablet 1  . prochlorperazine (COMPAZINE) 10 MG tablet Take 1 tablet (10 mg total) by mouth every 6 (six) hours as needed (Nausea or vomiting). 30 tablet 1  . sertraline (ZOLOFT) 100 MG tablet Take 1 tablet (100 mg total) by mouth daily. 90 tablet 3   No current facility-administered medications for this visit.    PHYSICAL EXAMINATION: ECOG PERFORMANCE STATUS: 1 - Symptomatic but completely ambulatory  Vitals:   06/22/20 1359  BP: (!) 148/69  Pulse: 75  Resp: 17  Temp: 98.6 F (37 C)  SpO2: 100%   Filed Weights   06/22/20 1359  Weight: 137 lb (62.1 kg)    GENERAL:alert, no distress and comfortable NEURO: alert & oriented x 3 with fluent speech, no focal motor/sensory deficits  LABORATORY DATA:  I have reviewed the data as listed    Component Value Date/Time   NA 139 06/21/2020 1423  NA 139 09/01/2018 1550   NA 138 11/22/2013 1447   K 3.9 06/21/2020 1423   K 4.2 11/22/2013 1447   CL 106 06/21/2020 1423   CL 104 11/03/2012 1419   CO2 24 06/21/2020 1423   CO2 28 11/22/2013 1447   GLUCOSE 159 (H) 06/21/2020 1423   GLUCOSE 319 (H) 11/22/2013 1447   GLUCOSE 119 (H) 11/03/2012 1419   BUN 13 06/21/2020 1423   BUN 13 09/01/2018 1550   BUN 11.5 11/22/2013 1447   CREATININE 0.83 06/21/2020 1423   CREATININE 0.8 11/22/2013 1447   CALCIUM 9.4 06/21/2020 1423   CALCIUM 10.0 11/22/2013 1447   PROT 8.3 (H) 06/13/2020 1345   PROT 6.9 11/22/2013 1447   ALBUMIN 3.8 06/13/2020 1345   ALBUMIN 3.5 11/22/2013 1447   AST 16 06/13/2020 1345   AST 11 11/22/2013 1447   ALT 9 06/13/2020 1345   ALT 13 11/22/2013 1447   ALKPHOS 66 06/13/2020 1345   ALKPHOS 94 11/22/2013 1447   BILITOT 0.6 06/13/2020 1345   BILITOT 0.20 11/22/2013 1447   GFRNONAA >60 06/21/2020 1423   GFRAA >60 06/21/2020 1423    No results found for: SPEP, UPEP  Lab Results  Component Value Date   WBC 10.7 (H) 06/21/2020   NEUTROABS 9.2 (H)  06/21/2020   HGB 9.1 (L) 06/21/2020   HCT 28.7 (L) 06/21/2020   MCV 86.7 06/21/2020   PLT 226 06/21/2020      Chemistry      Component Value Date/Time   NA 139 06/21/2020 1423   NA 139 09/01/2018 1550   NA 138 11/22/2013 1447   K 3.9 06/21/2020 1423   K 4.2 11/22/2013 1447   CL 106 06/21/2020 1423   CL 104 11/03/2012 1419   CO2 24 06/21/2020 1423   CO2 28 11/22/2013 1447   BUN 13 06/21/2020 1423   BUN 13 09/01/2018 1550   BUN 11.5 11/22/2013 1447   CREATININE 0.83 06/21/2020 1423   CREATININE 0.8 11/22/2013 1447      Component Value Date/Time   CALCIUM 9.4 06/21/2020 1423   CALCIUM 10.0 11/22/2013 1447   ALKPHOS 66 06/13/2020 1345   ALKPHOS 94 11/22/2013 1447   AST 16 06/13/2020 1345   AST 11 11/22/2013 1447   ALT 9 06/13/2020 1345   ALT 13 11/22/2013 1447   BILITOT 0.6 06/13/2020 1345   BILITOT 0.20 11/22/2013 1447

## 2020-06-23 ENCOUNTER — Ambulatory Visit
Admission: RE | Admit: 2020-06-23 | Discharge: 2020-06-23 | Disposition: A | Discharge status: Home / Self Care | Payer: Medicare PPO | Source: Ambulatory Visit | Attending: Radiation Oncology | Admitting: Radiation Oncology

## 2020-06-23 DIAGNOSIS — Z510 Encounter for antineoplastic radiation therapy: Secondary | ICD-10-CM | POA: Insufficient documentation

## 2020-06-23 DIAGNOSIS — C519 Malignant neoplasm of vulva, unspecified: Secondary | ICD-10-CM | POA: Insufficient documentation

## 2020-06-23 DIAGNOSIS — C518 Malignant neoplasm of overlapping sites of vulva: Secondary | ICD-10-CM | POA: Diagnosis not present

## 2020-06-26 ENCOUNTER — Ambulatory Visit
Admission: RE | Admit: 2020-06-26 | Discharge: 2020-06-26 | Disposition: A | Discharge status: Home / Self Care | Payer: Medicare PPO | Source: Ambulatory Visit | Attending: Radiation Oncology | Admitting: Radiation Oncology

## 2020-06-26 ENCOUNTER — Inpatient Hospital Stay: Payer: Medicare PPO

## 2020-06-26 DIAGNOSIS — C519 Malignant neoplasm of vulva, unspecified: Secondary | ICD-10-CM | POA: Diagnosis not present

## 2020-06-26 DIAGNOSIS — C518 Malignant neoplasm of overlapping sites of vulva: Secondary | ICD-10-CM | POA: Diagnosis not present

## 2020-06-26 DIAGNOSIS — Z510 Encounter for antineoplastic radiation therapy: Secondary | ICD-10-CM | POA: Diagnosis not present

## 2020-06-27 ENCOUNTER — Ambulatory Visit
Admission: RE | Admit: 2020-06-27 | Discharge: 2020-06-27 | Disposition: A | Discharge status: Home / Self Care | Payer: Medicare PPO | Source: Ambulatory Visit | Attending: Radiation Oncology | Admitting: Radiation Oncology

## 2020-06-27 ENCOUNTER — Other Ambulatory Visit: Payer: Self-pay

## 2020-06-27 ENCOUNTER — Inpatient Hospital Stay: Payer: Medicare PPO | Attending: Hematology and Oncology

## 2020-06-27 VITALS — BP 128/54 | HR 63 | Temp 98.1°F | Resp 18 | Wt 128.8 lb

## 2020-06-27 DIAGNOSIS — C519 Malignant neoplasm of vulva, unspecified: Secondary | ICD-10-CM | POA: Insufficient documentation

## 2020-06-27 DIAGNOSIS — R634 Abnormal weight loss: Secondary | ICD-10-CM | POA: Insufficient documentation

## 2020-06-27 DIAGNOSIS — D6481 Anemia due to antineoplastic chemotherapy: Secondary | ICD-10-CM | POA: Insufficient documentation

## 2020-06-27 DIAGNOSIS — Z5111 Encounter for antineoplastic chemotherapy: Secondary | ICD-10-CM | POA: Diagnosis not present

## 2020-06-27 DIAGNOSIS — G893 Neoplasm related pain (acute) (chronic): Secondary | ICD-10-CM | POA: Diagnosis not present

## 2020-06-27 DIAGNOSIS — Z7189 Other specified counseling: Secondary | ICD-10-CM

## 2020-06-27 DIAGNOSIS — Z510 Encounter for antineoplastic radiation therapy: Secondary | ICD-10-CM | POA: Diagnosis not present

## 2020-06-27 DIAGNOSIS — D696 Thrombocytopenia, unspecified: Secondary | ICD-10-CM | POA: Insufficient documentation

## 2020-06-27 DIAGNOSIS — Z79899 Other long term (current) drug therapy: Secondary | ICD-10-CM | POA: Diagnosis not present

## 2020-06-27 DIAGNOSIS — C518 Malignant neoplasm of overlapping sites of vulva: Secondary | ICD-10-CM | POA: Diagnosis not present

## 2020-06-27 MED ORDER — SODIUM CHLORIDE 0.9 % IV SOLN
40.0000 mg/m2 | Freq: Once | INTRAVENOUS | Status: AC
  Administered 2020-06-27: 63 mg via INTRAVENOUS
  Filled 2020-06-27: qty 63

## 2020-06-27 MED ORDER — SODIUM CHLORIDE 0.9 % IV SOLN
Freq: Once | INTRAVENOUS | Status: AC
  Filled 2020-06-27: qty 250

## 2020-06-27 MED ORDER — SODIUM CHLORIDE 0.9 % IV SOLN
10.0000 mg | Freq: Once | INTRAVENOUS | Status: AC
  Administered 2020-06-27: 10 mg via INTRAVENOUS
  Filled 2020-06-27: qty 10

## 2020-06-27 MED ORDER — HEPARIN SOD (PORK) LOCK FLUSH 100 UNIT/ML IV SOLN
500.0000 [IU] | Freq: Once | INTRAVENOUS | Status: AC | PRN
  Administered 2020-06-27: 500 [IU]
  Filled 2020-06-27: qty 5

## 2020-06-27 MED ORDER — SODIUM CHLORIDE 0.9% FLUSH
10.0000 mL | INTRAVENOUS | Status: DC | PRN
  Administered 2020-06-27: 10 mL
  Filled 2020-06-27: qty 10

## 2020-06-27 MED ORDER — PALONOSETRON HCL INJECTION 0.25 MG/5ML
0.2500 mg | Freq: Once | INTRAVENOUS | Status: AC
  Administered 2020-06-27: 0.25 mg via INTRAVENOUS

## 2020-06-27 MED ORDER — PALONOSETRON HCL INJECTION 0.25 MG/5ML
INTRAVENOUS | Status: AC
  Filled 2020-06-27: qty 5

## 2020-06-27 MED ORDER — SODIUM CHLORIDE 0.9 % IV SOLN
150.0000 mg | Freq: Once | INTRAVENOUS | Status: AC
  Administered 2020-06-27: 150 mg via INTRAVENOUS
  Filled 2020-06-27: qty 150

## 2020-06-27 MED ORDER — SODIUM CHLORIDE 0.9 % IV SOLN
Freq: Once | INTRAVENOUS | Status: AC
  Filled 2020-06-27: qty 10

## 2020-06-27 NOTE — Patient Instructions (Signed)
[facility] Discharge Instructions for Patients Receiving Chemotherapy  Today you received the following chemotherapy agents: Cisplatin.  To help prevent nausea and vomiting after your treatment, we encourage you to take your nausea medication as prescribed by your physician.    If you develop nausea and vomiting that is not controlled by your nausea medication, call the clinic.   BELOW ARE SYMPTOMS THAT SHOULD BE REPORTED IMMEDIATELY:  *FEVER GREATER THAN 100.5 F  *CHILLS WITH OR WITHOUT FEVER  NAUSEA AND VOMITING THAT IS NOT CONTROLLED WITH YOUR NAUSEA MEDICATION  *UNUSUAL SHORTNESS OF BREATH  *UNUSUAL BRUISING OR BLEEDING  TENDERNESS IN MOUTH AND THROAT WITH OR WITHOUT PRESENCE OF ULCERS  *URINARY PROBLEMS  *BOWEL PROBLEMS  UNUSUAL RASH Items with * indicate a potential emergency and should be followed up as soon as possible.  Feel free to call the clinic should you have any questions or concerns. The clinic phone number is [phone].  Please show the [place] at check-in to the Emergency Department and triage nurse.

## 2020-06-28 ENCOUNTER — Ambulatory Visit
Admission: RE | Admit: 2020-06-28 | Discharge: 2020-06-28 | Disposition: A | Discharge status: Home / Self Care | Payer: Medicare PPO | Source: Ambulatory Visit | Attending: Radiation Oncology | Admitting: Radiation Oncology

## 2020-06-28 ENCOUNTER — Inpatient Hospital Stay: Payer: Medicare PPO

## 2020-06-28 ENCOUNTER — Other Ambulatory Visit: Payer: Self-pay

## 2020-06-28 DIAGNOSIS — C519 Malignant neoplasm of vulva, unspecified: Secondary | ICD-10-CM | POA: Diagnosis not present

## 2020-06-28 DIAGNOSIS — Z7189 Other specified counseling: Secondary | ICD-10-CM

## 2020-06-28 DIAGNOSIS — C518 Malignant neoplasm of overlapping sites of vulva: Secondary | ICD-10-CM | POA: Diagnosis not present

## 2020-06-28 DIAGNOSIS — R634 Abnormal weight loss: Secondary | ICD-10-CM | POA: Diagnosis not present

## 2020-06-28 DIAGNOSIS — Z79899 Other long term (current) drug therapy: Secondary | ICD-10-CM | POA: Diagnosis not present

## 2020-06-28 DIAGNOSIS — D696 Thrombocytopenia, unspecified: Secondary | ICD-10-CM | POA: Diagnosis not present

## 2020-06-28 DIAGNOSIS — D6481 Anemia due to antineoplastic chemotherapy: Secondary | ICD-10-CM | POA: Diagnosis not present

## 2020-06-28 DIAGNOSIS — Z5111 Encounter for antineoplastic chemotherapy: Secondary | ICD-10-CM | POA: Diagnosis not present

## 2020-06-28 DIAGNOSIS — Z510 Encounter for antineoplastic radiation therapy: Secondary | ICD-10-CM | POA: Diagnosis not present

## 2020-06-28 DIAGNOSIS — G893 Neoplasm related pain (acute) (chronic): Secondary | ICD-10-CM | POA: Diagnosis not present

## 2020-06-28 LAB — CBC WITH DIFFERENTIAL (CANCER CENTER ONLY)
Abs Immature Granulocytes: 0.03 10*3/uL (ref 0.00–0.07)
Basophils Absolute: 0 10*3/uL (ref 0.0–0.1)
Basophils Relative: 0 %
Eosinophils Absolute: 0 10*3/uL (ref 0.0–0.5)
Eosinophils Relative: 0 %
HCT: 29.7 % — ABNORMAL LOW (ref 36.0–46.0)
Hemoglobin: 9.5 g/dL — ABNORMAL LOW (ref 12.0–15.0)
Immature Granulocytes: 0 %
Lymphocytes Relative: 10 %
Lymphs Abs: 0.7 10*3/uL (ref 0.7–4.0)
MCH: 27.3 pg (ref 26.0–34.0)
MCHC: 32 g/dL (ref 30.0–36.0)
MCV: 85.3 fL (ref 80.0–100.0)
Monocytes Absolute: 0.5 10*3/uL (ref 0.1–1.0)
Monocytes Relative: 7 %
Neutro Abs: 6.1 10*3/uL (ref 1.7–7.7)
Neutrophils Relative %: 83 %
Platelet Count: 215 10*3/uL (ref 150–400)
RBC: 3.48 MIL/uL — ABNORMAL LOW (ref 3.87–5.11)
RDW: 13.6 % (ref 11.5–15.5)
WBC Count: 7.4 10*3/uL (ref 4.0–10.5)
nRBC: 0 % (ref 0.0–0.2)

## 2020-06-28 LAB — BASIC METABOLIC PANEL - CANCER CENTER ONLY
Anion gap: 5 (ref 5–15)
BUN: 14 mg/dL (ref 8–23)
CO2: 29 mmol/L (ref 22–32)
Calcium: 9.7 mg/dL (ref 8.9–10.3)
Chloride: 107 mmol/L (ref 98–111)
Creatinine: 0.78 mg/dL (ref 0.44–1.00)
GFR, Estimated: >60 mL/min (ref >60)
Glucose, Bld: 171 mg/dL — ABNORMAL HIGH (ref 70–99)
Potassium: 4.4 mmol/L (ref 3.5–5.1)
Sodium: 141 mmol/L (ref 135–145)

## 2020-06-28 LAB — MAGNESIUM: Magnesium: 2.2 mg/dL (ref 1.7–2.4)

## 2020-06-29 ENCOUNTER — Other Ambulatory Visit: Payer: Self-pay | Admitting: Hematology and Oncology

## 2020-06-29 ENCOUNTER — Inpatient Hospital Stay (HOSPITAL_BASED_OUTPATIENT_CLINIC_OR_DEPARTMENT_OTHER): Payer: Medicare PPO | Admitting: Hematology and Oncology

## 2020-06-29 ENCOUNTER — Ambulatory Visit
Admission: RE | Admit: 2020-06-29 | Discharge: 2020-06-29 | Disposition: A | Discharge status: Home / Self Care | Payer: Medicare PPO | Source: Ambulatory Visit | Attending: Radiation Oncology | Admitting: Radiation Oncology

## 2020-06-29 ENCOUNTER — Encounter: Payer: Self-pay | Admitting: Hematology and Oncology

## 2020-06-29 ENCOUNTER — Other Ambulatory Visit: Payer: Self-pay

## 2020-06-29 DIAGNOSIS — C519 Malignant neoplasm of vulva, unspecified: Secondary | ICD-10-CM

## 2020-06-29 DIAGNOSIS — D6481 Anemia due to antineoplastic chemotherapy: Secondary | ICD-10-CM

## 2020-06-29 DIAGNOSIS — T451X5A Adverse effect of antineoplastic and immunosuppressive drugs, initial encounter: Secondary | ICD-10-CM | POA: Diagnosis not present

## 2020-06-29 DIAGNOSIS — G893 Neoplasm related pain (acute) (chronic): Secondary | ICD-10-CM

## 2020-06-29 DIAGNOSIS — C518 Malignant neoplasm of overlapping sites of vulva: Secondary | ICD-10-CM | POA: Diagnosis not present

## 2020-06-29 DIAGNOSIS — Z510 Encounter for antineoplastic radiation therapy: Secondary | ICD-10-CM | POA: Diagnosis not present

## 2020-06-29 DIAGNOSIS — D696 Thrombocytopenia, unspecified: Secondary | ICD-10-CM | POA: Diagnosis not present

## 2020-06-29 DIAGNOSIS — Z79899 Other long term (current) drug therapy: Secondary | ICD-10-CM | POA: Diagnosis not present

## 2020-06-29 DIAGNOSIS — Z5111 Encounter for antineoplastic chemotherapy: Secondary | ICD-10-CM | POA: Diagnosis not present

## 2020-06-29 DIAGNOSIS — R634 Abnormal weight loss: Secondary | ICD-10-CM | POA: Diagnosis not present

## 2020-06-29 MED ORDER — HYDROCODONE-ACETAMINOPHEN 5-325 MG PO TABS
1.0000 | ORAL_TABLET | Freq: Four times a day (QID) | ORAL | 0 refills | Status: DC | PRN
Start: 2020-06-29 — End: 2020-07-27

## 2020-06-29 MED FILL — HYDROCODON-APAP 5-325: 5-325 | 7 days supply | Qty: 30 | Fill #0

## 2020-06-29 NOTE — Assessment & Plan Note (Signed)
She tolerated chemotherapy fairly well  She will continue treatment as scheduled and I will see her again next week for further follow-up

## 2020-06-29 NOTE — Progress Notes (Signed)
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   All questions were answered. The patient knows to call the clinic with any problems, questions or concerns. The total time spent in the appointment was 20 minutes encounter with patients including review of chart and various tests results, discussions about plan of care and coordination of care plan   Heath Lark, MD 06/29/2020 2:27 PM  INTERVAL HISTORY: Please see below for problem oriented charting. She returns with her son for further follow-up She tolerated recent chemotherapy better than the previous time Her pain is well controlled She denies nausea She has chronic constipation but stable with laxative No peripheral neuropathy The patient denies any recent signs or symptoms of bleeding such as spontaneous epistaxis, hematuria or hematochezia.   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 Freddi Che), multiple vulvar re-excision  Surgeon: Margaretmary Bayley, MD; Andi Devon MD  Findings: Bilateral sentinel LN identified, both hot and blue. See Dr. Bethel Born note for full details. Posterior vulva with evidence of partial wound breakdown without evidence of infection. Granulation tissue noted with exposed subcutaneous sutures. 2x2cm concerning nodular plaque at 7 o'clock, medial margin <1cm from anus. Rectal exam at conclusion normal.   Specimens: 1. Left vulvar margin 2. Right vulvar margin 3. Left sentinel inguinal LN 4. Right sentinel inguinal LN 5. Vulvar lesion at 7 oclock, stitch at 12 oclock    03/04/2016 Pathology Results   A: Lymph node, right inguinal sentinel #2, removal - 1 lymph node negative for metastatic carcinoma (0/1). - Pan cytokeratin confirmatory (negative).   B: Lymph node, right inguinal sentinel #1, removal  - 2 lymph nodes, negative for metastatic carcinoma (0/2). - Pan cytokeratin confirmatory (negative).   C: Lymph node, left inguinal sentinel #1, removal  - 1 lymph node negative for metastatic carcinoma (0/1). - Pan cytokeratin confirmatory (negative).   D: Vulva, left margin, biopsy  - Ulcerated squamous tissue and granulation tissue. - Acute inflammation and suture granuloma. - No dysplasia or malignancy identified.   E: Vulva, right margin, biopsy  - Skin and subcutaneous tissue with fat necrosis, ulceration, fibrosis and granulation tissue. - No dysplasia or malignancy identified.   F: Vulva, right, partial vulvectomy  - Minute microscopic focus of  residual invasive squamous cell carcinoma, < 1 mm in horizontal dimension and 0.7 mm depth of invasion (F2)  - Warty high grade squamous  intraepithelial lesion (VIN 2 and VIN 3) involving the central portion and extends into adnexal structures (F2-F4). - Margins for invasive carcinoma are negative (deep 4 mm, 3 o'clock half 5 mm and 9 o'clock half 7 mm) - Margins for HSIL:  3-o'clock half positive for VIN 2. - Other findings: Fat necrosis, fibrosis and chronic inflammation   04/11/2020 Pathology Results   1. Vulva, biopsy, top - AT LEAST SQUAMOUS CELL CARCINOMA IN SITU, SUSPICIOUS FOR INVASION. SEE NOTE 2. Vulva, biopsy, middle - SQUAMOUS CARCINOMA IN SITU. SEE NOTE 3. Vulva, biopsy, bottom - SQUAMOUS CARCINOMA IN SITU. SEE NOTE   05/03/2020 PET scan   Hypermetabolic bilateral vulvar skin thickening max SUV 19, consistent with patient's known malignancy.   Hypermetabolic bilateral inguinal lymph nodes.  Right inguinal node measuring 16 x 25 mm max SUV 6.9  Left inguinal lymph node measuring 12 x 15 mm max SUV 5.3   No other worrisome hypermetabolic activity identified.   Additional findings:  Lobulated bilateral renal cortex suggesting scarring.  Postop change with the left breast.   05/16/2020 Surgery   Surgeon(s) and Role: * Hart Rochester, MD - Primary  Specimens:  ID Type Source Tests Collected by Time  1 : RIGHT INGUINAL Tissue Lymph Node PATHOLOGY TISSUE REQUEST Hart Rochester, MD 05/16/2020 [phone]  2 : LEFT INGUINAL Tissue Lymph Node PATHOLOGY TISSUE REQUEST Hart Rochester, MD 05/16/2020 [phone]    Findings:  Bilateral inguinal nodes c/w metastatic disease. On exam under anesthesia, the patient was noted to have a large pedunculated mass emanating from the left labia. This was fairly mobile and did not appear to be deeply invasive. Further evaluation of this pedunculated mass revealed that there was disease crossing the perineal body extending up to the right labia and into the vaginal introitus. This was along the area of prior surgical resection. This did not appear to extend to the urethral meatus  but full visualization of this was somewhat difficult given the bulkiness of the tumor and the friability. It was not felt that this was resectable based on the extent of disease and size of the lesion.    05/16/2020 Pathology Results   1.  Right inguinal lymph node, resection:      -Two lymph nodes, negative for carcinoma (0/2). 2.  Left inguinal lymph node, resection:      -Three lymph nodes, negative for carcinoma (0/3).   06/06/2020 Cancer Staging   Staging form: Vulva, AJCC 8th Edition - Clinical stage from 06/06/2020: Stage Unknown (rcTX, cN0, cM0) - Signed by Heath Lark, MD on 06/06/2020   06/13/2020 Procedure   Technically successful right IJ power-injectable port catheter placement. Ready for routine use   06/20/2020 -  Chemotherapy   The patient had palonosetron (ALOXI) injection 0.25 mg, 0.25 mg, Intravenous,  Once, 2 of 5 cycles Administration: 0.25 mg (06/20/2020), 0.25 mg (06/27/2020) CISplatin (PLATINOL) 63 mg in sodium chloride 0.9 % 250 mL chemo infusion, 40 mg/m2 = 63 mg, Intravenous,  Once, 2 of 5 cycles Administration: 63 mg (06/20/2020), 63 mg (06/27/2020) fosaprepitant (EMEND) 150 mg in sodium chloride 0.9 % 145 mL IVPB, 150 mg, Intravenous,  Once, 2 of 5 cycles Administration: 150 mg (06/20/2020), 150 mg (06/27/2020)  for chemotherapy treatment.      REVIEW OF SYSTEMS:   Constitutional: Denies fevers, chills or abnormal weight loss  Eyes: Denies blurriness of vision Ears, nose, mouth, throat, and face: Denies mucositis or sore throat Respiratory: Denies cough, dyspnea or wheezes Cardiovascular: Denies palpitation, chest discomfort or lower extremity swelling Skin: Denies abnormal skin rashes Lymphatics: Denies new lymphadenopathy or easy bruising Neurological:Denies numbness, tingling or new weaknesses Behavioral/Psych: Mood is stable, no new changes  All other systems were reviewed with the patient and are negative.  I have reviewed the past medical history, past  surgical history, social history and family history with the patient and they are unchanged from previous note.  ALLERGIES:  is allergic to duloxetine.  MEDICATIONS:  Current Outpatient Medications  Medication Sig Dispense Refill  . Cyanocobalamin (VITAMIN B 12 PO) Take 1,000 mcg by mouth daily.    Marland Kitchen donepezil (ARICEPT) 10 MG tablet Take 10 mg by mouth at bedtime.    . ergocalciferol (VITAMIN D2) 50000 UNITS capsule Take 50,000 Units by mouth every Tuesday.     . flecainide (TAMBOCOR) 50 MG tablet Take 1 tablet (50 mg total) by mouth 2 (two) times daily. 180 tablet 3  . HYDROcodone-acetaminophen (NORCO/VICODIN) 5-325 MG tablet Take 1 tablet by mouth every 6 (six) hours as needed. 30 tablet 0  . lidocaine-prilocaine (EMLA) cream Apply to affected area once 30 g 3  . metoprolol tartrate (LOPRESSOR) 25 MG tablet Take 1 tablet (25 mg total) by mouth 2 (two) times daily. 180 tablet 2  . mirtazapine (REMERON) 15 MG tablet Take 1 tablet (15 mg total) by mouth at bedtime. 30 tablet 11  . Omega-3 Fatty Acids (FISH OIL) 1200 MG CAPS Take 2 capsules by mouth every morning.    . ondansetron (ZOFRAN) 8 MG tablet Take 1 tablet (8 mg total) by mouth every 8 (eight) hours as needed. Start on the third day after cisplatin chemotherapy. 30 tablet 1  . prochlorperazine (COMPAZINE) 10 MG tablet Take 1 tablet (10 mg total) by mouth every 6 (six) hours as needed (Nausea or vomiting). 30 tablet 1  . sertraline (ZOLOFT) 100 MG tablet Take 1 tablet (100 mg total) by mouth daily. 90 tablet 3   No current facility-administered medications for this visit.    PHYSICAL EXAMINATION: ECOG PERFORMANCE STATUS: 1 - Symptomatic but completely ambulatory  Vitals:   06/29/20 1358  BP: (!) 142/69  Pulse: 76  Resp: 17  Temp: 99.1 F (37.3 C)  SpO2: 100%   Filed Weights   06/29/20 1358  Weight: 133 lb 9.6 oz (60.6 kg)    GENERAL:alert, no distress and comfortable SKIN: skin color, texture, turgor are normal, no  rashes or significant lesions EYES: normal, Conjunctiva are pink and non-injected, sclera clear OROPHARYNX:no exudate, no erythema and lips, buccal mucosa, and tongue normal  NECK: supple, thyroid normal size, non-tender, without nodularity LYMPH:  no palpable lymphadenopathy in the cervical, axillary or inguinal LUNGS: clear to auscultation and percussion with normal breathing effort HEART: regular rate & rhythm and no murmurs and no lower extremity edema ABDOMEN:abdomen soft, non-tender and normal bowel sounds Musculoskeletal:no cyanosis of digits and no clubbing  NEURO: alert & oriented x 3 with fluent speech, no focal motor/sensory deficits  LABORATORY DATA:  I have reviewed the data as listed    Component Value Date/Time   NA 141 06/28/2020 0915   NA 139 09/01/2018 1550   NA 138 11/22/2013 1447   K 4.4 06/28/2020 0915   K 4.2 11/22/2013 1447   CL 107 06/28/2020 0915   CL 104 11/03/2012 1419   CO2 29 06/28/2020 0915  CO2 28 11/22/2013 1447   GLUCOSE 171 (H) 06/28/2020 0915   GLUCOSE 319 (H) 11/22/2013 1447   GLUCOSE 119 (H) 11/03/2012 1419   BUN 14 06/28/2020 0915   BUN 13 09/01/2018 1550   BUN 11.5 11/22/2013 1447   CREATININE 0.78 06/28/2020 0915   CREATININE 0.8 11/22/2013 1447   CALCIUM 9.7 06/28/2020 0915   CALCIUM 10.0 11/22/2013 1447   PROT 8.3 (H) 06/13/2020 1345   PROT 6.9 11/22/2013 1447   ALBUMIN 3.8 06/13/2020 1345   ALBUMIN 3.5 11/22/2013 1447   AST 16 06/13/2020 1345   AST 11 11/22/2013 1447   ALT 9 06/13/2020 1345   ALT 13 11/22/2013 1447   ALKPHOS 66 06/13/2020 1345   ALKPHOS 94 11/22/2013 1447   BILITOT 0.6 06/13/2020 1345   BILITOT 0.20 11/22/2013 1447   GFRNONAA >60 06/28/2020 0915   GFRAA >60 06/21/2020 1423    No results found for: SPEP, UPEP  Lab Results  Component Value Date   WBC 7.4 06/28/2020   NEUTROABS 6.1 06/28/2020   HGB 9.5 (L) 06/28/2020   HCT 29.7 (L) 06/28/2020   MCV 85.3 06/28/2020   PLT 215 06/28/2020       Chemistry      Component Value Date/Time   NA 141 06/28/2020 0915   NA 139 09/01/2018 1550   NA 138 11/22/2013 1447   K 4.4 06/28/2020 0915   K 4.2 11/22/2013 1447   CL 107 06/28/2020 0915   CL 104 11/03/2012 1419   CO2 29 06/28/2020 0915   CO2 28 11/22/2013 1447   BUN 14 06/28/2020 0915   BUN 13 09/01/2018 1550   BUN 11.5 11/22/2013 1447   CREATININE 0.78 06/28/2020 0915   CREATININE 0.8 11/22/2013 1447      Component Value Date/Time   CALCIUM 9.7 06/28/2020 0915   CALCIUM 10.0 11/22/2013 1447   ALKPHOS 66 06/13/2020 1345   ALKPHOS 94 11/22/2013 1447   AST 16 06/13/2020 1345   AST 11 11/22/2013 1447   ALT 9 06/13/2020 1345   ALT 13 11/22/2013 1447   BILITOT 0.6 06/13/2020 1345   BILITOT 0.20 11/22/2013 1447

## 2020-06-29 NOTE — Assessment & Plan Note (Signed)
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

## 2020-06-29 NOTE — Assessment & Plan Note (Signed)
She continues to take pain medicine twice a day to get her pain under control I refill her prescription today We discussed narcotic refill policy I warned her about risk of nausea and constipation

## 2020-06-30 ENCOUNTER — Ambulatory Visit
Admission: RE | Admit: 2020-06-30 | Discharge: 2020-06-30 | Disposition: A | Discharge status: Home / Self Care | Payer: Medicare PPO | Source: Ambulatory Visit | Attending: Radiation Oncology | Admitting: Radiation Oncology

## 2020-06-30 DIAGNOSIS — Z510 Encounter for antineoplastic radiation therapy: Secondary | ICD-10-CM | POA: Diagnosis not present

## 2020-06-30 DIAGNOSIS — C518 Malignant neoplasm of overlapping sites of vulva: Secondary | ICD-10-CM | POA: Diagnosis not present

## 2020-06-30 DIAGNOSIS — C519 Malignant neoplasm of vulva, unspecified: Secondary | ICD-10-CM | POA: Diagnosis not present

## 2020-07-03 ENCOUNTER — Ambulatory Visit: Payer: Medicare PPO

## 2020-07-03 ENCOUNTER — Other Ambulatory Visit: Payer: Self-pay

## 2020-07-03 ENCOUNTER — Ambulatory Visit
Admission: RE | Admit: 2020-07-03 | Discharge: 2020-07-03 | Disposition: A | Discharge status: Home / Self Care | Payer: Medicare PPO | Source: Ambulatory Visit | Attending: Radiation Oncology | Admitting: Radiation Oncology

## 2020-07-03 DIAGNOSIS — C519 Malignant neoplasm of vulva, unspecified: Secondary | ICD-10-CM | POA: Diagnosis not present

## 2020-07-03 DIAGNOSIS — C518 Malignant neoplasm of overlapping sites of vulva: Secondary | ICD-10-CM | POA: Diagnosis not present

## 2020-07-03 DIAGNOSIS — Z510 Encounter for antineoplastic radiation therapy: Secondary | ICD-10-CM | POA: Diagnosis not present

## 2020-07-04 ENCOUNTER — Ambulatory Visit: Payer: Medicare PPO

## 2020-07-04 ENCOUNTER — Inpatient Hospital Stay: Payer: Medicare PPO

## 2020-07-04 ENCOUNTER — Other Ambulatory Visit: Payer: Self-pay

## 2020-07-04 VITALS — BP 114/59 | HR 73 | Temp 98.4°F | Resp 16

## 2020-07-04 DIAGNOSIS — C519 Malignant neoplasm of vulva, unspecified: Secondary | ICD-10-CM

## 2020-07-04 DIAGNOSIS — Z7189 Other specified counseling: Secondary | ICD-10-CM

## 2020-07-04 DIAGNOSIS — R634 Abnormal weight loss: Secondary | ICD-10-CM | POA: Diagnosis not present

## 2020-07-04 DIAGNOSIS — D6481 Anemia due to antineoplastic chemotherapy: Secondary | ICD-10-CM | POA: Diagnosis not present

## 2020-07-04 DIAGNOSIS — Z79899 Other long term (current) drug therapy: Secondary | ICD-10-CM | POA: Diagnosis not present

## 2020-07-04 DIAGNOSIS — D696 Thrombocytopenia, unspecified: Secondary | ICD-10-CM | POA: Diagnosis not present

## 2020-07-04 DIAGNOSIS — Z5111 Encounter for antineoplastic chemotherapy: Secondary | ICD-10-CM | POA: Diagnosis not present

## 2020-07-04 DIAGNOSIS — G893 Neoplasm related pain (acute) (chronic): Secondary | ICD-10-CM | POA: Diagnosis not present

## 2020-07-04 MED ORDER — SODIUM CHLORIDE 0.9 % IV SOLN
Freq: Once | INTRAVENOUS | Status: AC
  Filled 2020-07-04: qty 10

## 2020-07-04 MED ORDER — SODIUM CHLORIDE 0.9 % IV SOLN
Freq: Once | INTRAVENOUS | Status: AC
  Filled 2020-07-04: qty 250

## 2020-07-04 MED ORDER — SODIUM CHLORIDE 0.9 % IV SOLN
40.0000 mg/m2 | Freq: Once | INTRAVENOUS | Status: AC
  Administered 2020-07-04: 63 mg via INTRAVENOUS
  Filled 2020-07-04: qty 63

## 2020-07-04 MED ORDER — PALONOSETRON HCL INJECTION 0.25 MG/5ML
0.2500 mg | Freq: Once | INTRAVENOUS | Status: AC
  Administered 2020-07-04: 0.25 mg via INTRAVENOUS

## 2020-07-04 MED ORDER — HEPARIN SOD (PORK) LOCK FLUSH 100 UNIT/ML IV SOLN
500.0000 [IU] | Freq: Once | INTRAVENOUS | Status: AC | PRN
  Administered 2020-07-04: 500 [IU]
  Filled 2020-07-04: qty 5

## 2020-07-04 MED ORDER — PALONOSETRON HCL INJECTION 0.25 MG/5ML
INTRAVENOUS | Status: AC
  Filled 2020-07-04: qty 5

## 2020-07-04 MED ORDER — SODIUM CHLORIDE 0.9 % IV SOLN
150.0000 mg | Freq: Once | INTRAVENOUS | Status: AC
  Administered 2020-07-04: 150 mg via INTRAVENOUS
  Filled 2020-07-04: qty 150

## 2020-07-04 MED ORDER — SODIUM CHLORIDE 0.9 % IV SOLN
INTRAVENOUS | Status: DC
  Filled 2020-07-04 (×2): qty 250

## 2020-07-04 MED ORDER — SODIUM CHLORIDE 0.9% FLUSH
10.0000 mL | INTRAVENOUS | Status: DC | PRN
  Administered 2020-07-04: 10 mL
  Filled 2020-07-04: qty 10

## 2020-07-04 MED ORDER — SODIUM CHLORIDE 0.9 % IV SOLN
10.0000 mg | Freq: Once | INTRAVENOUS | Status: AC
  Administered 2020-07-04: 10 mg via INTRAVENOUS
  Filled 2020-07-04: qty 10

## 2020-07-04 NOTE — Patient Instructions (Signed)
Please call our office if you have not voided (peed) by 07/05/20 at 1000am!  [facility] Discharge Instructions for Patients Receiving Chemotherapy  Today you received the following chemotherapy agents: Cisplatin.  To help prevent nausea and vomiting after your treatment, we encourage you to take your nausea medication as prescribed by your physician.    If you develop nausea and vomiting that is not controlled by your nausea medication, call the clinic.   BELOW ARE SYMPTOMS THAT SHOULD BE REPORTED IMMEDIATELY:  *FEVER GREATER THAN 100.5 F  *CHILLS WITH OR WITHOUT FEVER  NAUSEA AND VOMITING THAT IS NOT CONTROLLED WITH YOUR NAUSEA MEDICATION  *UNUSUAL SHORTNESS OF BREATH  *UNUSUAL BRUISING OR BLEEDING  TENDERNESS IN MOUTH AND THROAT WITH OR WITHOUT PRESENCE OF ULCERS  *URINARY PROBLEMS  *BOWEL PROBLEMS  UNUSUAL RASH Items with * indicate a potential emergency and should be followed up as soon as possible.  Feel free to call the clinic should you have any questions or concerns. The clinic phone number is [phone].  Please show the CHEMO ALERT CARD at check-in to the Emergency Department and triage nurse.   

## 2020-07-04 NOTE — Progress Notes (Signed)
Patient not agreeable to attempt to void after pre-hydration fluids. Per Dr. Alvy Bimler, okay to treat without pre-administration void, run 1L NS over 2h concurrently with chemotherapy in addition to post-hydration fluids.

## 2020-07-05 ENCOUNTER — Ambulatory Visit
Admission: RE | Admit: 2020-07-05 | Discharge: 2020-07-05 | Disposition: A | Discharge status: Home / Self Care | Payer: Medicare PPO | Source: Ambulatory Visit | Attending: Radiation Oncology | Admitting: Radiation Oncology

## 2020-07-05 ENCOUNTER — Inpatient Hospital Stay: Payer: Medicare PPO

## 2020-07-05 ENCOUNTER — Other Ambulatory Visit: Payer: Self-pay

## 2020-07-05 DIAGNOSIS — C518 Malignant neoplasm of overlapping sites of vulva: Secondary | ICD-10-CM | POA: Diagnosis not present

## 2020-07-05 DIAGNOSIS — D696 Thrombocytopenia, unspecified: Secondary | ICD-10-CM | POA: Diagnosis not present

## 2020-07-05 DIAGNOSIS — G893 Neoplasm related pain (acute) (chronic): Secondary | ICD-10-CM | POA: Diagnosis not present

## 2020-07-05 DIAGNOSIS — Z510 Encounter for antineoplastic radiation therapy: Secondary | ICD-10-CM | POA: Diagnosis not present

## 2020-07-05 DIAGNOSIS — R634 Abnormal weight loss: Secondary | ICD-10-CM | POA: Diagnosis not present

## 2020-07-05 DIAGNOSIS — Z7189 Other specified counseling: Secondary | ICD-10-CM

## 2020-07-05 DIAGNOSIS — C519 Malignant neoplasm of vulva, unspecified: Secondary | ICD-10-CM

## 2020-07-05 DIAGNOSIS — D6481 Anemia due to antineoplastic chemotherapy: Secondary | ICD-10-CM | POA: Diagnosis not present

## 2020-07-05 DIAGNOSIS — Z5111 Encounter for antineoplastic chemotherapy: Secondary | ICD-10-CM | POA: Diagnosis not present

## 2020-07-05 DIAGNOSIS — Z79899 Other long term (current) drug therapy: Secondary | ICD-10-CM | POA: Diagnosis not present

## 2020-07-05 LAB — CBC WITH DIFFERENTIAL (CANCER CENTER ONLY)
Abs Immature Granulocytes: 0.05 10*3/uL (ref 0.00–0.07)
Basophils Absolute: 0 10*3/uL (ref 0.0–0.1)
Basophils Relative: 0 %
Eosinophils Absolute: 0 10*3/uL (ref 0.0–0.5)
Eosinophils Relative: 0 %
HCT: 26.5 % — ABNORMAL LOW (ref 36.0–46.0)
Hemoglobin: 8.4 g/dL — ABNORMAL LOW (ref 12.0–15.0)
Immature Granulocytes: 1 %
Lymphocytes Relative: 11 %
Lymphs Abs: 0.7 10*3/uL (ref 0.7–4.0)
MCH: 27.5 pg (ref 26.0–34.0)
MCHC: 31.7 g/dL (ref 30.0–36.0)
MCV: 86.6 fL (ref 80.0–100.0)
Monocytes Absolute: 0.6 10*3/uL (ref 0.1–1.0)
Monocytes Relative: 9 %
Neutro Abs: 5.1 10*3/uL (ref 1.7–7.7)
Neutrophils Relative %: 79 %
Platelet Count: 143 10*3/uL — ABNORMAL LOW (ref 150–400)
RBC: 3.06 MIL/uL — ABNORMAL LOW (ref 3.87–5.11)
RDW: 13.7 % (ref 11.5–15.5)
WBC Count: 6.5 10*3/uL (ref 4.0–10.5)
nRBC: 0 % (ref 0.0–0.2)

## 2020-07-05 LAB — BASIC METABOLIC PANEL - CANCER CENTER ONLY
Anion gap: 5 (ref 5–15)
BUN: 16 mg/dL (ref 8–23)
CO2: 27 mmol/L (ref 22–32)
Calcium: 9.5 mg/dL (ref 8.9–10.3)
Chloride: 107 mmol/L (ref 98–111)
Creatinine: 0.81 mg/dL (ref 0.44–1.00)
GFR, Estimated: >60 mL/min (ref >60)
Glucose, Bld: 123 mg/dL — ABNORMAL HIGH (ref 70–99)
Potassium: 4.1 mmol/L (ref 3.5–5.1)
Sodium: 139 mmol/L (ref 135–145)

## 2020-07-05 LAB — MAGNESIUM: Magnesium: 2.1 mg/dL (ref 1.7–2.4)

## 2020-07-06 ENCOUNTER — Ambulatory Visit
Admission: RE | Admit: 2020-07-06 | Discharge: 2020-07-06 | Disposition: A | Discharge status: Home / Self Care | Payer: Medicare PPO | Source: Ambulatory Visit | Attending: Radiation Oncology | Admitting: Radiation Oncology

## 2020-07-06 ENCOUNTER — Telehealth: Payer: Self-pay

## 2020-07-06 ENCOUNTER — Ambulatory Visit: Payer: Medicare PPO

## 2020-07-06 ENCOUNTER — Inpatient Hospital Stay (HOSPITAL_BASED_OUTPATIENT_CLINIC_OR_DEPARTMENT_OTHER): Payer: Medicare PPO | Admitting: Hematology and Oncology

## 2020-07-06 ENCOUNTER — Inpatient Hospital Stay: Payer: Medicare PPO

## 2020-07-06 ENCOUNTER — Encounter: Payer: Self-pay | Admitting: Hematology and Oncology

## 2020-07-06 ENCOUNTER — Other Ambulatory Visit: Payer: Self-pay

## 2020-07-06 VITALS — BP 147/78 | HR 69 | Temp 98.6°F | Resp 18 | Ht 60.0 in | Wt 132.6 lb

## 2020-07-06 DIAGNOSIS — C519 Malignant neoplasm of vulva, unspecified: Secondary | ICD-10-CM

## 2020-07-06 DIAGNOSIS — K5909 Other constipation: Secondary | ICD-10-CM

## 2020-07-06 DIAGNOSIS — R634 Abnormal weight loss: Secondary | ICD-10-CM | POA: Diagnosis not present

## 2020-07-06 DIAGNOSIS — D6481 Anemia due to antineoplastic chemotherapy: Secondary | ICD-10-CM | POA: Diagnosis not present

## 2020-07-06 DIAGNOSIS — Z5111 Encounter for antineoplastic chemotherapy: Secondary | ICD-10-CM | POA: Diagnosis not present

## 2020-07-06 DIAGNOSIS — Z79899 Other long term (current) drug therapy: Secondary | ICD-10-CM | POA: Diagnosis not present

## 2020-07-06 DIAGNOSIS — D696 Thrombocytopenia, unspecified: Secondary | ICD-10-CM | POA: Diagnosis not present

## 2020-07-06 DIAGNOSIS — T451X5A Adverse effect of antineoplastic and immunosuppressive drugs, initial encounter: Secondary | ICD-10-CM

## 2020-07-06 DIAGNOSIS — G893 Neoplasm related pain (acute) (chronic): Secondary | ICD-10-CM

## 2020-07-06 DIAGNOSIS — C518 Malignant neoplasm of overlapping sites of vulva: Secondary | ICD-10-CM | POA: Diagnosis not present

## 2020-07-06 DIAGNOSIS — Z510 Encounter for antineoplastic radiation therapy: Secondary | ICD-10-CM | POA: Diagnosis not present

## 2020-07-06 LAB — CBC WITH DIFFERENTIAL/PLATELET
Abs Immature Granulocytes: 0.02 10*3/uL (ref 0.00–0.07)
Basophils Absolute: 0 10*3/uL (ref 0.0–0.1)
Basophils Relative: 0 %
Eosinophils Absolute: 0 10*3/uL (ref 0.0–0.5)
Eosinophils Relative: 0 %
HCT: 26.3 % — ABNORMAL LOW (ref 36.0–46.0)
Hemoglobin: 8.3 g/dL — ABNORMAL LOW (ref 12.0–15.0)
Immature Granulocytes: 0 %
Lymphocytes Relative: 20 %
Lymphs Abs: 1.1 10*3/uL (ref 0.7–4.0)
MCH: 27.5 pg (ref 26.0–34.0)
MCHC: 31.6 g/dL (ref 30.0–36.0)
MCV: 87.1 fL (ref 80.0–100.0)
Monocytes Absolute: 0.5 10*3/uL (ref 0.1–1.0)
Monocytes Relative: 9 %
Neutro Abs: 3.7 10*3/uL (ref 1.7–7.7)
Neutrophils Relative %: 71 %
Platelets: 124 10*3/uL — ABNORMAL LOW (ref 150–400)
RBC: 3.02 MIL/uL — ABNORMAL LOW (ref 3.87–5.11)
RDW: 13.9 % (ref 11.5–15.5)
WBC: 5.3 10*3/uL (ref 4.0–10.5)
nRBC: 0 % (ref 0.0–0.2)

## 2020-07-06 NOTE — Telephone Encounter (Signed)
Given son appt for blood transfusion appt for him Mom on 10/15 at 1 pm. Instructed to arrive at 1245 for appt and to keep blood bracelet on. Son verbalized understanding.

## 2020-07-06 NOTE — Assessment & Plan Note (Signed)
We discussed some of the risks, benefits, and alternatives of blood transfusions. The patient is symptomatic from anemia and the hemoglobin level is critically low.  Some of the side-effects to be expected including risks of transfusion reactions, chills, infection, syndrome of volume overload and risk of hospitalization from various reasons and the patient is willing to proceed and went ahead to sign consent today. We will proceed with 1 unit of blood transfusion tomorrow

## 2020-07-06 NOTE — Progress Notes (Signed)
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 The patient is symptomatic from anemia and the hemoglobin level is critically low.  Some of the side-effects to be expected including risks of transfusion reactions, chills, infection, syndrome of volume overload and risk of hospitalization from various reasons and the patient is willing to proceed and went ahead to sign consent today. We will proceed with 1 unit of blood transfusion tomorrow  Cancer associated pain She continues to take pain medicine twice a day or as needed to get her pain under control I warned her about risk of nausea and constipation  Other constipation We have extensive discussion about laxative therapy  Weight loss, abnormal I started her on mirtazapine last week She continues to have progressive weight loss Given the toxicity observe, I plan to reduce the dose of chemotherapy next week   Orders Placed This Encounter  Procedures  . CBC with Differential/Platelet    Standing Status:   Future    Number of Occurrences:   1    Standing Expiration Date:   07/06/2021  . Informed Consent Details: Physician/Practitioner Attestation; Transcribe to consent form and obtain patient signature    Standing Status:   Future    Standing Expiration Date:   07/06/2021    Order Specific Question:    Physician/Practitioner attestation of informed consent for blood and or blood product transfusion    Answer:   I, the physician/practitioner, attest that I have discussed with the patient the benefits, risks, side effects, alternatives, likelihood of achieving goals and potential problems during recovery for the procedure that I have provided informed consent.    Order Specific Question:   Product(s)    Answer:   All Product(s)  . Care order/instruction    Transfuse Parameters    Standing Status:   Future    Standing Expiration Date:   07/06/2021  . Sample to Blood Bank    Standing Status:   Standing    Number of Occurrences:   33    Standing Expiration Date:   07/06/2021  . ABO/RH    Standing Status:   Future    Number of Occurrences:   1    Standing Expiration Date:   07/06/2021  . Type and screen    Standing Status:   Future    Number of Occurrences:   1    Standing Expiration Date:   07/06/2021  . Prepare RBC (crossmatch)    Standing Status:   Standing    Number of Occurrences:   1    Order Specific Question:   # of Units    Answer:   1 unit    Order Specific Question:   Transfusion Indications    Answer:   Symptomatic Anemia    Order Specific Question:   Special Requirements    Answer:   Irradiated    Order Specific Question:   Number of Units to Keep Ahead  Answer:   NO units ahead    Order Specific Question:   Instructions:    Answer:   Transfuse    Order Specific Question:   If emergent release call blood bank    Answer:   Not emergent release    All questions were answered. The patient knows to call the clinic with any problems, questions or concerns. The total time spent in the appointment was 40 minutes encounter with patients including review of chart and various tests results, discussions about plan of care and coordination of care plan   Heath Lark, MD 07/06/2020 3:29 PM  INTERVAL HISTORY: Please see below for problem oriented charting. She is seen with  her son today The patient continues to exhibit profound memory issues She was noncompliant with hydration when she show up for chemotherapy recently She received extra IV fluids for hydration The patient continues to have progressive weight loss She denies nausea or vomiting but stated she have poor appetite despite being started on mirtazapine She only eats when she desire to eat The patient denies any recent signs or symptoms of bleeding such as spontaneous epistaxis, hematuria or hematochezia. 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 Freddi Che), multiple vulvar re-excision  Surgeon: Margaretmary Bayley, MD; Andi Devon MD  Findings: Bilateral sentinel LN identified, both hot and blue. See Dr. Bethel Born note for full details. Posterior vulva with evidence of partial wound breakdown without evidence of infection. Granulation tissue noted with exposed subcutaneous sutures. 2x2cm concerning nodular plaque at 7 o'clock, medial margin <1cm from anus. Rectal exam at conclusion normal.   Specimens: 1. Left vulvar margin 2. Right vulvar margin 3. Left sentinel inguinal LN 4. Right sentinel inguinal LN 5. Vulvar lesion at 7 oclock, stitch at 12 oclock    03/04/2016 Pathology Results   A: Lymph node,  right inguinal sentinel #2, removal - 1 lymph node negative for metastatic carcinoma (0/1). - Pan cytokeratin confirmatory (negative).   B: Lymph node, right inguinal sentinel #1, removal  - 2 lymph nodes, negative for metastatic carcinoma (0/2). - Pan cytokeratin confirmatory (negative).   C: Lymph node, left inguinal sentinel #1, removal  - 1 lymph node negative for metastatic carcinoma (0/1). - Pan cytokeratin confirmatory (negative).   D: Vulva, left margin, biopsy  - Ulcerated squamous tissue and granulation tissue. - Acute inflammation and suture granuloma. - No dysplasia or malignancy identified.   E: Vulva, right margin, biopsy  - Skin and subcutaneous tissue with fat necrosis, ulceration, fibrosis and granulation tissue. - No dysplasia or malignancy identified.   F: Vulva, right, partial vulvectomy  - Minute microscopic focus of residual invasive squamous cell carcinoma, < 1 mm in horizontal dimension and 0.7 mm depth of invasion (F2)  - Warty high grade squamous intraepithelial lesion (VIN 2 and VIN 3) involving the central portion and extends into  adnexal structures (F2-F4). - Margins for invasive carcinoma are negative (deep 4 mm, 3 o'clock half 5 mm and 9 o'clock half 7 mm) - Margins for HSIL:  3-o'clock half positive for VIN 2. - Other findings: Fat necrosis, fibrosis and chronic inflammation   04/11/2020 Pathology Results   1. Vulva, biopsy, top - AT LEAST SQUAMOUS CELL CARCINOMA IN SITU, SUSPICIOUS FOR INVASION. SEE NOTE 2. Vulva, biopsy, middle - SQUAMOUS CARCINOMA IN SITU. SEE NOTE 3. Vulva, biopsy, bottom - SQUAMOUS CARCINOMA IN SITU. SEE NOTE   05/03/2020 PET scan   Hypermetabolic bilateral vulvar skin thickening max SUV 19, consistent with patient's known malignancy.   Hypermetabolic bilateral inguinal lymph nodes.  Right inguinal node measuring 16 x 25 mm max SUV 6.9  Left inguinal lymph node measuring 12 x 15 mm max SUV 5.3   No other worrisome  hypermetabolic activity identified.   Additional findings:  Lobulated bilateral renal cortex suggesting scarring.  Postop change with the left breast.   05/16/2020 Surgery   Surgeon(s) and Role: * Hart Rochester, MD - Primary  Specimens:  ID Type Source Tests Collected by Time  1 : RIGHT INGUINAL Tissue Lymph Node PATHOLOGY TISSUE REQUEST Hart Rochester, MD 05/16/2020 [phone]  2 : LEFT INGUINAL Tissue Lymph Node PATHOLOGY TISSUE REQUEST Hart Rochester, MD 05/16/2020 [phone]    Findings:  Bilateral inguinal nodes c/w metastatic disease. On exam under anesthesia, the patient was noted to have a large pedunculated mass emanating from the left labia. This was fairly mobile and did not appear to be deeply invasive. Further evaluation of this pedunculated mass revealed that there was disease crossing the perineal body extending up to the right labia and into the vaginal introitus. This was along the area of prior surgical resection. This did not appear to extend to the urethral meatus but full visualization of this was somewhat difficult given the bulkiness of the tumor and the friability. It was not felt that this was resectable based on the extent of disease and size of the lesion.    05/16/2020 Pathology Results   1.  Right inguinal lymph node, resection:      -Two lymph nodes, negative for carcinoma (0/2). 2.  Left inguinal lymph node, resection:      -Three lymph nodes, negative for carcinoma (0/3).   06/06/2020 Cancer Staging   Staging form: Vulva, AJCC 8th Edition - Clinical stage from 06/06/2020: Stage Unknown (rcTX, cN0, cM0) - Signed by Heath Lark, MD on 06/06/2020   06/13/2020 Procedure   Technically successful right IJ power-injectable port catheter placement. Ready for routine use   06/20/2020 -  Chemotherapy   The patient had palonosetron (ALOXI) injection 0.25 mg, 0.25 mg, Intravenous,  Once, 3 of 5 cycles Administration: 0.25 mg (06/20/2020), 0.25 mg (06/27/2020), 0.25 mg  (07/04/2020) CISplatin (PLATINOL) 63 mg in sodium chloride 0.9 % 250 mL chemo infusion, 40 mg/m2 = 63 mg, Intravenous,  Once, 3 of 5 cycles Dose modification: 30 mg/m2 (75 % of original dose 40 mg/m2, Cycle 4, Reason: Dose Not Tolerated) Administration: 63 mg (06/20/2020), 63 mg (06/27/2020), 63 mg (07/04/2020) fosaprepitant (EMEND) 150 mg in sodium chloride 0.9 % 145 mL IVPB, 150 mg, Intravenous,  Once, 3 of 5 cycles Administration: 150 mg (06/20/2020), 150 mg (06/27/2020), 150 mg (07/04/2020)  for chemotherapy treatment.      REVIEW OF SYSTEMS:   Constitutional: Denies fevers, chills  Eyes: Denies blurriness of vision Ears, nose, mouth, throat, and face: Denies mucositis or sore  throat Respiratory: Denies cough, dyspnea or wheezes Cardiovascular: Denies palpitation, chest discomfort or lower extremity swelling Skin: Denies abnormal skin rashes Lymphatics: Denies new lymphadenopathy or easy bruising Neurological:Denies numbness, tingling or new weaknesses Behavioral/Psych: Mood is stable, no new changes  All other systems were reviewed with the patient and are negative.  I have reviewed the past medical history, past surgical history, social history and family history with the patient and they are unchanged from previous note.  ALLERGIES:  is allergic to duloxetine.  MEDICATIONS:  Current Outpatient Medications  Medication Sig Dispense Refill  . Cyanocobalamin (VITAMIN B 12 PO) Take 1,000 mcg by mouth daily.    Marland Kitchen donepezil (ARICEPT) 10 MG tablet Take 10 mg by mouth at bedtime.    . ergocalciferol (VITAMIN D2) 50000 UNITS capsule Take 50,000 Units by mouth every Tuesday.     . flecainide (TAMBOCOR) 50 MG tablet Take 1 tablet (50 mg total) by mouth 2 (two) times daily. 180 tablet 3  . HYDROcodone-acetaminophen (NORCO/VICODIN) 5-325 MG tablet Take 1 tablet by mouth every 6 (six) hours as needed. 30 tablet 0  . lidocaine-prilocaine (EMLA) cream Apply to affected area once 30 g 3  .  metoprolol tartrate (LOPRESSOR) 25 MG tablet Take 1 tablet (25 mg total) by mouth 2 (two) times daily. 180 tablet 2  . mirtazapine (REMERON) 15 MG tablet Take 1 tablet (15 mg total) by mouth at bedtime. 30 tablet 11  . Omega-3 Fatty Acids (FISH OIL) 1200 MG CAPS Take 2 capsules by mouth every morning.    . ondansetron (ZOFRAN) 8 MG tablet Take 1 tablet (8 mg total) by mouth every 8 (eight) hours as needed. Start on the third day after cisplatin chemotherapy. 30 tablet 1  . prochlorperazine (COMPAZINE) 10 MG tablet Take 1 tablet (10 mg total) by mouth every 6 (six) hours as needed (Nausea or vomiting). 30 tablet 1  . sertraline (ZOLOFT) 100 MG tablet Take 1 tablet (100 mg total) by mouth daily. 90 tablet 3   No current facility-administered medications for this visit.    PHYSICAL EXAMINATION: ECOG PERFORMANCE STATUS: 1 - Symptomatic but completely ambulatory  Vitals:   07/06/20 1359  BP: (!) 147/78  Pulse: 69  Resp: 18  Temp: 98.6 F (37 C)  SpO2: 100%   Filed Weights   07/06/20 1359  Weight: 132 lb 9.6 oz (60.1 kg)    GENERAL:alert, no distress and comfortable.  She looks thin and cachectic  NEURO: alert & oriented x 3 with fluent speech, no focal motor/sensory deficits  LABORATORY DATA:  I have reviewed the data as listed    Component Value Date/Time   NA 139 07/05/2020 1428   NA 139 09/01/2018 1550   NA 138 11/22/2013 1447   K 4.1 07/05/2020 1428   K 4.2 11/22/2013 1447   CL 107 07/05/2020 1428   CL 104 11/03/2012 1419   CO2 27 07/05/2020 1428   CO2 28 11/22/2013 1447   GLUCOSE 123 (H) 07/05/2020 1428   GLUCOSE 319 (H) 11/22/2013 1447   GLUCOSE 119 (H) 11/03/2012 1419   BUN 16 07/05/2020 1428   BUN 13 09/01/2018 1550   BUN 11.5 11/22/2013 1447   CREATININE 0.81 07/05/2020 1428   CREATININE 0.8 11/22/2013 1447   CALCIUM 9.5 07/05/2020 1428   CALCIUM 10.0 11/22/2013 1447   PROT 8.3 (H) 06/13/2020 1345   PROT 6.9 11/22/2013 1447   ALBUMIN 3.8 06/13/2020 1345    ALBUMIN 3.5 11/22/2013 1447   AST 16 06/13/2020  1345   AST 11 11/22/2013 1447   ALT 9 06/13/2020 1345   ALT 13 11/22/2013 1447   ALKPHOS 66 06/13/2020 1345   ALKPHOS 94 11/22/2013 1447   BILITOT 0.6 06/13/2020 1345   BILITOT 0.20 11/22/2013 1447   GFRNONAA >60 07/05/2020 1428   GFRAA >60 06/21/2020 1423    No results found for: SPEP, UPEP  Lab Results  Component Value Date   WBC 5.3 07/06/2020   NEUTROABS 3.7 07/06/2020   HGB 8.3 (L) 07/06/2020   HCT 26.3 (L) 07/06/2020   MCV 87.1 07/06/2020   PLT 124 (L) 07/06/2020      Chemistry      Component Value Date/Time   NA 139 07/05/2020 1428   NA 139 09/01/2018 1550   NA 138 11/22/2013 1447   K 4.1 07/05/2020 1428   K 4.2 11/22/2013 1447   CL 107 07/05/2020 1428   CL 104 11/03/2012 1419   CO2 27 07/05/2020 1428   CO2 28 11/22/2013 1447   BUN 16 07/05/2020 1428   BUN 13 09/01/2018 1550   BUN 11.5 11/22/2013 1447   CREATININE 0.81 07/05/2020 1428   CREATININE 0.8 11/22/2013 1447      Component Value Date/Time   CALCIUM 9.5 07/05/2020 1428   CALCIUM 10.0 11/22/2013 1447   ALKPHOS 66 06/13/2020 1345   ALKPHOS 94 11/22/2013 1447   AST 16 06/13/2020 1345   AST 11 11/22/2013 1447   ALT 9 06/13/2020 1345   ALT 13 11/22/2013 1447   BILITOT 0.6 06/13/2020 1345   BILITOT 0.20 11/22/2013 1447

## 2020-07-06 NOTE — Assessment & Plan Note (Signed)
She continues to take pain medicine twice a day or as needed to get her pain under control I warned her about risk of nausea and constipation

## 2020-07-06 NOTE — Assessment & Plan Note (Signed)
I started her on mirtazapine last week She continues to have progressive weight loss Given the toxicity observe, I plan to reduce the dose of chemotherapy next week

## 2020-07-06 NOTE — Assessment & Plan Note (Signed)
Unfortunately, the patient continues to exhibit difficulties complying with medical recommendations She is getting profoundly anemic I have long discussion with the patient and her son In order for radiation to work well, she needs to be transfused We will arrange blood transfusion tomorrow I plan to reduce chemotherapy next week due to toxicity and progressive weight loss

## 2020-07-06 NOTE — Assessment & Plan Note (Signed)
We have extensive discussion about laxative therapy

## 2020-07-07 ENCOUNTER — Inpatient Hospital Stay: Payer: Medicare PPO

## 2020-07-07 ENCOUNTER — Other Ambulatory Visit: Payer: Self-pay

## 2020-07-07 ENCOUNTER — Ambulatory Visit
Admission: RE | Admit: 2020-07-07 | Discharge: 2020-07-07 | Disposition: A | Discharge status: Home / Self Care | Payer: Medicare PPO | Source: Ambulatory Visit | Attending: Radiation Oncology | Admitting: Radiation Oncology

## 2020-07-07 DIAGNOSIS — Z510 Encounter for antineoplastic radiation therapy: Secondary | ICD-10-CM | POA: Diagnosis not present

## 2020-07-07 DIAGNOSIS — C519 Malignant neoplasm of vulva, unspecified: Secondary | ICD-10-CM

## 2020-07-07 DIAGNOSIS — D6481 Anemia due to antineoplastic chemotherapy: Secondary | ICD-10-CM

## 2020-07-07 DIAGNOSIS — D696 Thrombocytopenia, unspecified: Secondary | ICD-10-CM | POA: Diagnosis not present

## 2020-07-07 DIAGNOSIS — T451X5A Adverse effect of antineoplastic and immunosuppressive drugs, initial encounter: Secondary | ICD-10-CM

## 2020-07-07 DIAGNOSIS — Z79899 Other long term (current) drug therapy: Secondary | ICD-10-CM | POA: Diagnosis not present

## 2020-07-07 DIAGNOSIS — C518 Malignant neoplasm of overlapping sites of vulva: Secondary | ICD-10-CM | POA: Diagnosis not present

## 2020-07-07 DIAGNOSIS — G893 Neoplasm related pain (acute) (chronic): Secondary | ICD-10-CM | POA: Diagnosis not present

## 2020-07-07 DIAGNOSIS — Z5111 Encounter for antineoplastic chemotherapy: Secondary | ICD-10-CM | POA: Diagnosis not present

## 2020-07-07 DIAGNOSIS — R634 Abnormal weight loss: Secondary | ICD-10-CM | POA: Diagnosis not present

## 2020-07-07 LAB — SAMPLE TO BLOOD BANK

## 2020-07-07 LAB — PREPARE RBC (CROSSMATCH)

## 2020-07-07 MED ORDER — SODIUM CHLORIDE 0.9% IV SOLUTION
250.0000 mL | Freq: Once | INTRAVENOUS | Status: AC
  Administered 2020-07-07: 250 mL via INTRAVENOUS
  Filled 2020-07-07: qty 250

## 2020-07-07 MED ORDER — ACETAMINOPHEN 325 MG PO TABS
650.0000 mg | ORAL_TABLET | Freq: Once | ORAL | Status: AC
  Administered 2020-07-07: 650 mg via ORAL

## 2020-07-07 MED ORDER — DIPHENHYDRAMINE HCL 25 MG PO CAPS
ORAL_CAPSULE | ORAL | Status: AC
  Filled 2020-07-07: qty 1

## 2020-07-07 MED ORDER — HEPARIN SOD (PORK) LOCK FLUSH 100 UNIT/ML IV SOLN
250.0000 [IU] | INTRAVENOUS | Status: DC | PRN
  Filled 2020-07-07: qty 5

## 2020-07-07 MED ORDER — SODIUM CHLORIDE 0.9% FLUSH
10.0000 mL | INTRAVENOUS | Status: DC | PRN
  Filled 2020-07-07: qty 10

## 2020-07-07 MED ORDER — DIPHENHYDRAMINE HCL 25 MG PO CAPS
25.0000 mg | ORAL_CAPSULE | Freq: Once | ORAL | Status: AC
  Administered 2020-07-07: 25 mg via ORAL

## 2020-07-07 MED ORDER — SODIUM CHLORIDE 0.9% FLUSH
3.0000 mL | INTRAVENOUS | Status: DC | PRN
  Filled 2020-07-07: qty 10

## 2020-07-07 MED ORDER — ACETAMINOPHEN 325 MG PO TABS
ORAL_TABLET | ORAL | Status: AC
  Filled 2020-07-07: qty 2

## 2020-07-07 MED ORDER — SODIUM CHLORIDE 0.9% FLUSH
10.0000 mL | INTRAVENOUS | Status: AC | PRN
  Administered 2020-07-07: 10 mL
  Filled 2020-07-07: qty 10

## 2020-07-07 MED ORDER — HEPARIN SOD (PORK) LOCK FLUSH 100 UNIT/ML IV SOLN
500.0000 [IU] | Freq: Every day | INTRAVENOUS | Status: AC | PRN
  Administered 2020-07-07: 500 [IU]
  Filled 2020-07-07: qty 5

## 2020-07-07 NOTE — Patient Instructions (Signed)

## 2020-07-08 LAB — TYPE AND SCREEN
ABO/RH(D): O POS
Antibody Screen: NEGATIVE
Unit division: 0

## 2020-07-08 LAB — BPAM RBC
Blood Product Expiration Date: 202110282359
ISSUE DATE / TIME: 202110151413
Unit Type and Rh: 5100

## 2020-07-10 ENCOUNTER — Ambulatory Visit: Payer: Medicare PPO

## 2020-07-10 ENCOUNTER — Ambulatory Visit
Admission: RE | Admit: 2020-07-10 | Discharge: 2020-07-10 | Disposition: A | Discharge status: Home / Self Care | Payer: Medicare PPO | Source: Ambulatory Visit | Attending: Radiation Oncology | Admitting: Radiation Oncology

## 2020-07-10 DIAGNOSIS — C519 Malignant neoplasm of vulva, unspecified: Secondary | ICD-10-CM | POA: Diagnosis not present

## 2020-07-10 DIAGNOSIS — C518 Malignant neoplasm of overlapping sites of vulva: Secondary | ICD-10-CM | POA: Diagnosis not present

## 2020-07-10 DIAGNOSIS — Z510 Encounter for antineoplastic radiation therapy: Secondary | ICD-10-CM | POA: Diagnosis not present

## 2020-07-11 ENCOUNTER — Other Ambulatory Visit: Payer: Self-pay

## 2020-07-11 ENCOUNTER — Ambulatory Visit
Admission: RE | Admit: 2020-07-11 | Discharge: 2020-07-11 | Disposition: A | Discharge status: Home / Self Care | Payer: Medicare PPO | Source: Ambulatory Visit | Attending: Radiation Oncology | Admitting: Radiation Oncology

## 2020-07-11 ENCOUNTER — Inpatient Hospital Stay: Payer: Medicare PPO

## 2020-07-11 VITALS — BP 146/62 | HR 75 | Temp 98.3°F | Resp 18 | Wt 128.0 lb

## 2020-07-11 DIAGNOSIS — Z7189 Other specified counseling: Secondary | ICD-10-CM

## 2020-07-11 DIAGNOSIS — Z5111 Encounter for antineoplastic chemotherapy: Secondary | ICD-10-CM | POA: Diagnosis not present

## 2020-07-11 DIAGNOSIS — C519 Malignant neoplasm of vulva, unspecified: Secondary | ICD-10-CM | POA: Diagnosis not present

## 2020-07-11 DIAGNOSIS — D6481 Anemia due to antineoplastic chemotherapy: Secondary | ICD-10-CM | POA: Diagnosis not present

## 2020-07-11 DIAGNOSIS — C518 Malignant neoplasm of overlapping sites of vulva: Secondary | ICD-10-CM | POA: Diagnosis not present

## 2020-07-11 DIAGNOSIS — Z79899 Other long term (current) drug therapy: Secondary | ICD-10-CM | POA: Diagnosis not present

## 2020-07-11 DIAGNOSIS — D696 Thrombocytopenia, unspecified: Secondary | ICD-10-CM | POA: Diagnosis not present

## 2020-07-11 DIAGNOSIS — G893 Neoplasm related pain (acute) (chronic): Secondary | ICD-10-CM | POA: Diagnosis not present

## 2020-07-11 DIAGNOSIS — R634 Abnormal weight loss: Secondary | ICD-10-CM | POA: Diagnosis not present

## 2020-07-11 DIAGNOSIS — Z510 Encounter for antineoplastic radiation therapy: Secondary | ICD-10-CM | POA: Diagnosis not present

## 2020-07-11 MED ORDER — PALONOSETRON HCL INJECTION 0.25 MG/5ML
INTRAVENOUS | Status: AC
  Filled 2020-07-11: qty 5

## 2020-07-11 MED ORDER — HEPARIN SOD (PORK) LOCK FLUSH 100 UNIT/ML IV SOLN
500.0000 [IU] | Freq: Once | INTRAVENOUS | Status: AC | PRN
  Administered 2020-07-11: 500 [IU]
  Filled 2020-07-11: qty 5

## 2020-07-11 MED ORDER — SODIUM CHLORIDE 0.9 % IV SOLN
10.0000 mg | Freq: Once | INTRAVENOUS | Status: AC
  Administered 2020-07-11: 10 mg via INTRAVENOUS
  Filled 2020-07-11: qty 10

## 2020-07-11 MED ORDER — SODIUM CHLORIDE 0.9 % IV SOLN
Freq: Once | INTRAVENOUS | Status: AC
  Filled 2020-07-11: qty 10

## 2020-07-11 MED ORDER — SODIUM CHLORIDE 0.9 % IV SOLN
Freq: Once | INTRAVENOUS | Status: AC
  Filled 2020-07-11: qty 250

## 2020-07-11 MED ORDER — PALONOSETRON HCL INJECTION 0.25 MG/5ML
0.2500 mg | Freq: Once | INTRAVENOUS | Status: AC
  Administered 2020-07-11: 0.25 mg via INTRAVENOUS

## 2020-07-11 MED ORDER — SODIUM CHLORIDE 0.9 % IV SOLN
150.0000 mg | Freq: Once | INTRAVENOUS | Status: AC
  Administered 2020-07-11: 150 mg via INTRAVENOUS
  Filled 2020-07-11: qty 150

## 2020-07-11 MED ORDER — SODIUM CHLORIDE 0.9 % IV SOLN
INTRAVENOUS | Status: AC
  Filled 2020-07-11: qty 250

## 2020-07-11 MED ORDER — SODIUM CHLORIDE 0.9 % IV SOLN
30.0000 mg/m2 | Freq: Once | INTRAVENOUS | Status: AC
  Administered 2020-07-11: 48 mg via INTRAVENOUS
  Filled 2020-07-11: qty 48

## 2020-07-11 MED ORDER — SODIUM CHLORIDE 0.9% FLUSH
10.0000 mL | INTRAVENOUS | Status: DC | PRN
  Administered 2020-07-11: 10 mL
  Filled 2020-07-11: qty 10

## 2020-07-11 NOTE — Patient Instructions (Signed)
Please call our office if you have not voided (peed) by 07/05/20 at 1000am!  [facility] Discharge Instructions for Patients Receiving Chemotherapy  Today you received the following chemotherapy agents: Cisplatin.  To help prevent nausea and vomiting after your treatment, we encourage you to take your nausea medication as prescribed by your physician.    If you develop nausea and vomiting that is not controlled by your nausea medication, call the clinic.   BELOW ARE SYMPTOMS THAT SHOULD BE REPORTED IMMEDIATELY:  *FEVER GREATER THAN 100.5 F  *CHILLS WITH OR WITHOUT FEVER  NAUSEA AND VOMITING THAT IS NOT CONTROLLED WITH YOUR NAUSEA MEDICATION  *UNUSUAL SHORTNESS OF BREATH  *UNUSUAL BRUISING OR BLEEDING  TENDERNESS IN MOUTH AND THROAT WITH OR WITHOUT PRESENCE OF ULCERS  *URINARY PROBLEMS  *BOWEL PROBLEMS  UNUSUAL RASH Items with * indicate a potential emergency and should be followed up as soon as possible.  Feel free to call the clinic should you have any questions or concerns. The clinic phone number is [phone].  Please show the [facility] at check-in to the Emergency Department and triage nurse.

## 2020-07-11 NOTE — Progress Notes (Signed)
Pt has voided 100 ml's prior to tx, per Dr. Alvy Bimler ok to go ahead with cisplatin.  One liter of fluids to be run over two hours concurrently.

## 2020-07-12 ENCOUNTER — Ambulatory Visit
Admission: RE | Admit: 2020-07-12 | Discharge: 2020-07-12 | Disposition: A | Discharge status: Home / Self Care | Payer: Medicare PPO | Source: Ambulatory Visit | Attending: Radiation Oncology | Admitting: Radiation Oncology

## 2020-07-12 ENCOUNTER — Other Ambulatory Visit: Payer: Self-pay

## 2020-07-12 ENCOUNTER — Inpatient Hospital Stay: Payer: Medicare PPO

## 2020-07-12 DIAGNOSIS — D6481 Anemia due to antineoplastic chemotherapy: Secondary | ICD-10-CM | POA: Diagnosis not present

## 2020-07-12 DIAGNOSIS — R634 Abnormal weight loss: Secondary | ICD-10-CM | POA: Diagnosis not present

## 2020-07-12 DIAGNOSIS — D696 Thrombocytopenia, unspecified: Secondary | ICD-10-CM | POA: Diagnosis not present

## 2020-07-12 DIAGNOSIS — Z79899 Other long term (current) drug therapy: Secondary | ICD-10-CM | POA: Diagnosis not present

## 2020-07-12 DIAGNOSIS — Z5111 Encounter for antineoplastic chemotherapy: Secondary | ICD-10-CM | POA: Diagnosis not present

## 2020-07-12 DIAGNOSIS — C518 Malignant neoplasm of overlapping sites of vulva: Secondary | ICD-10-CM | POA: Diagnosis not present

## 2020-07-12 DIAGNOSIS — C519 Malignant neoplasm of vulva, unspecified: Secondary | ICD-10-CM | POA: Diagnosis not present

## 2020-07-12 DIAGNOSIS — Z7189 Other specified counseling: Secondary | ICD-10-CM

## 2020-07-12 DIAGNOSIS — Z510 Encounter for antineoplastic radiation therapy: Secondary | ICD-10-CM | POA: Diagnosis not present

## 2020-07-12 DIAGNOSIS — T451X5A Adverse effect of antineoplastic and immunosuppressive drugs, initial encounter: Secondary | ICD-10-CM

## 2020-07-12 DIAGNOSIS — G893 Neoplasm related pain (acute) (chronic): Secondary | ICD-10-CM | POA: Diagnosis not present

## 2020-07-12 LAB — BASIC METABOLIC PANEL - CANCER CENTER ONLY
Anion gap: 9 (ref 5–15)
BUN: 15 mg/dL (ref 8–23)
CO2: 25 mmol/L (ref 22–32)
Calcium: 9.4 mg/dL (ref 8.9–10.3)
Chloride: 107 mmol/L (ref 98–111)
Creatinine: 0.95 mg/dL (ref 0.44–1.00)
GFR, Estimated: 58 mL/min — ABNORMAL LOW (ref >60)
Glucose, Bld: 150 mg/dL — ABNORMAL HIGH (ref 70–99)
Potassium: 4.3 mmol/L (ref 3.5–5.1)
Sodium: 141 mmol/L (ref 135–145)

## 2020-07-12 LAB — CBC WITH DIFFERENTIAL (CANCER CENTER ONLY)
Abs Immature Granulocytes: 0.03 10*3/uL (ref 0.00–0.07)
Basophils Absolute: 0 10*3/uL (ref 0.0–0.1)
Basophils Relative: 0 %
Eosinophils Absolute: 0 10*3/uL (ref 0.0–0.5)
Eosinophils Relative: 0 %
HCT: 30.2 % — ABNORMAL LOW (ref 36.0–46.0)
Hemoglobin: 9.8 g/dL — ABNORMAL LOW (ref 12.0–15.0)
Immature Granulocytes: 1 %
Lymphocytes Relative: 13 %
Lymphs Abs: 0.6 10*3/uL — ABNORMAL LOW (ref 0.7–4.0)
MCH: 27.8 pg (ref 26.0–34.0)
MCHC: 32.5 g/dL (ref 30.0–36.0)
MCV: 85.8 fL (ref 80.0–100.0)
Monocytes Absolute: 0.5 10*3/uL (ref 0.1–1.0)
Monocytes Relative: 12 %
Neutro Abs: 3.2 10*3/uL (ref 1.7–7.7)
Neutrophils Relative %: 74 %
Platelet Count: 101 10*3/uL — ABNORMAL LOW (ref 150–400)
RBC: 3.52 MIL/uL — ABNORMAL LOW (ref 3.87–5.11)
RDW: 13.5 % (ref 11.5–15.5)
WBC Count: 4.4 10*3/uL (ref 4.0–10.5)
nRBC: 0 % (ref 0.0–0.2)

## 2020-07-12 LAB — MAGNESIUM: Magnesium: 1.9 mg/dL (ref 1.7–2.4)

## 2020-07-12 LAB — SAMPLE TO BLOOD BANK

## 2020-07-13 ENCOUNTER — Inpatient Hospital Stay (HOSPITAL_BASED_OUTPATIENT_CLINIC_OR_DEPARTMENT_OTHER): Payer: Medicare PPO | Admitting: Hematology and Oncology

## 2020-07-13 ENCOUNTER — Other Ambulatory Visit: Payer: Self-pay

## 2020-07-13 ENCOUNTER — Encounter: Payer: Self-pay | Admitting: Hematology and Oncology

## 2020-07-13 ENCOUNTER — Ambulatory Visit
Admission: RE | Admit: 2020-07-13 | Discharge: 2020-07-13 | Disposition: A | Discharge status: Home / Self Care | Payer: Medicare PPO | Source: Ambulatory Visit | Attending: Radiation Oncology | Admitting: Radiation Oncology

## 2020-07-13 DIAGNOSIS — D6481 Anemia due to antineoplastic chemotherapy: Secondary | ICD-10-CM | POA: Diagnosis not present

## 2020-07-13 DIAGNOSIS — Z5111 Encounter for antineoplastic chemotherapy: Secondary | ICD-10-CM | POA: Diagnosis not present

## 2020-07-13 DIAGNOSIS — T451X5A Adverse effect of antineoplastic and immunosuppressive drugs, initial encounter: Secondary | ICD-10-CM

## 2020-07-13 DIAGNOSIS — Z510 Encounter for antineoplastic radiation therapy: Secondary | ICD-10-CM | POA: Diagnosis not present

## 2020-07-13 DIAGNOSIS — R634 Abnormal weight loss: Secondary | ICD-10-CM | POA: Diagnosis not present

## 2020-07-13 DIAGNOSIS — D696 Thrombocytopenia, unspecified: Secondary | ICD-10-CM | POA: Diagnosis not present

## 2020-07-13 DIAGNOSIS — C519 Malignant neoplasm of vulva, unspecified: Secondary | ICD-10-CM

## 2020-07-13 DIAGNOSIS — C518 Malignant neoplasm of overlapping sites of vulva: Secondary | ICD-10-CM | POA: Diagnosis not present

## 2020-07-13 DIAGNOSIS — Z79899 Other long term (current) drug therapy: Secondary | ICD-10-CM | POA: Diagnosis not present

## 2020-07-13 DIAGNOSIS — G893 Neoplasm related pain (acute) (chronic): Secondary | ICD-10-CM

## 2020-07-13 NOTE — Assessment & Plan Note (Signed)
She complained of less pain She has medicine to take as needed

## 2020-07-13 NOTE — Progress Notes (Signed)
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[facility]) She is doing better this week After blood transfusion, she felt stronger She denies nausea, constipation or side effects from recent treatment No peripheral neuropathy She has gained a bit of weight since last time I saw her She has less pelvic pain We will proceed with treatment as scheduled next week  Anemia due to antineoplastic chemotherapy After recent blood transfusion, her hemoglobin is stable She does not need further blood transfusion for now  Thrombocytopenia [facility]) This is likely due to recent treatment. The patient denies recent history of bleeding such as epistaxis, hematuria or hematochezia. She is asymptomatic from the low platelet count. I will observe for now.  she does not require transfusion now. I will continue the chemotherapy at current dose without dosage adjustment.  If the thrombocytopenia gets progressive worse in the future, I might have to delay her treatment or adjust the chemotherapy dose.    Cancer associated pain She complained of less pain She has medicine to take as needed   No orders of the defined types were placed in this encounter.   All questions were answered. The patient knows to call the clinic with any problems, questions or concerns. The total time spent in the appointment was 20 minutes encounter with patients including review of chart and various tests results, discussions about plan of care and coordination of care plan   Heath Lark, MD 07/13/2020 2:09 PM  INTERVAL HISTORY: Please see below for problem oriented charting. 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 Freddi Che), multiple vulvar re-excision  Surgeon: Margaretmary Bayley, MD; Andi Devon MD  Findings: Bilateral sentinel LN identified, both hot and blue. See Dr. Bethel Born note for full details. Posterior vulva with evidence of partial wound breakdown without evidence of infection. Granulation tissue noted with exposed subcutaneous sutures. 2x2cm concerning nodular plaque at 7 o'clock, medial margin <1cm from anus. Rectal exam at conclusion normal.   Specimens: 1. Left vulvar margin 2. Right vulvar margin 3. Left sentinel inguinal LN 4. Right sentinel inguinal LN 5. Vulvar lesion at 7 oclock, stitch at 12 oclock    03/04/2016 Pathology Results   A: Lymph node, right inguinal sentinel #2, removal - 1 lymph node negative for metastatic carcinoma (0/1). - Pan cytokeratin confirmatory (negative).   B: Lymph node, right inguinal sentinel #1, removal  - 2 lymph nodes, negative for metastatic carcinoma (0/2). - Pan cytokeratin confirmatory (negative).   C: Lymph node, left inguinal sentinel #1, removal  - 1 lymph node  negative for metastatic carcinoma (0/1). - Pan cytokeratin confirmatory (negative).   D: Vulva, left margin, biopsy  - Ulcerated squamous tissue and granulation tissue. - Acute inflammation and suture  granuloma. - No dysplasia or malignancy identified.   E: Vulva, right margin, biopsy  - Skin and subcutaneous tissue with fat necrosis, ulceration, fibrosis and granulation tissue. - No dysplasia or malignancy identified.   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 - Margins for invasive carcinoma are negative (deep 4 mm, 3 o'clock half 5 mm and 9 o'clock half 7 mm) - Margins for HSIL:  3-o'clock half positive for VIN 2. - Other findings: Fat necrosis, fibrosis and chronic inflammation   04/11/2020 Pathology Results   1. Vulva, biopsy, top - AT LEAST SQUAMOUS CELL CARCINOMA IN SITU, SUSPICIOUS FOR INVASION. SEE NOTE 2. Vulva, biopsy, middle - SQUAMOUS CARCINOMA IN SITU. SEE NOTE 3. Vulva, biopsy, bottom - SQUAMOUS CARCINOMA IN SITU. SEE NOTE   05/03/2020 PET scan   Hypermetabolic bilateral vulvar skin thickening max SUV 19, consistent with patient's known malignancy.   Hypermetabolic bilateral inguinal lymph nodes.  Right inguinal node measuring 16 x 25 mm max SUV 6.9  Left inguinal lymph node measuring 12 x 15 mm max SUV 5.3   No other worrisome hypermetabolic activity identified.   Additional findings:  Lobulated bilateral renal cortex suggesting scarring.  Postop change with the left breast.   05/16/2020 Surgery   Surgeon(s) and Role: * Hart Rochester, MD - Primary  Specimens:  ID Type Source Tests Collected by Time  1 : RIGHT INGUINAL Tissue Lymph Node PATHOLOGY TISSUE REQUEST Hart Rochester, MD 05/16/2020 [phone]  2 : LEFT INGUINAL Tissue Lymph Node PATHOLOGY TISSUE REQUEST Hart Rochester, MD 05/16/2020 [phone]    Findings:  Bilateral inguinal nodes c/w metastatic disease. On exam under  anesthesia, the patient was noted to have a large pedunculated mass emanating from the left labia. This was fairly mobile and did not appear to be deeply invasive. Further evaluation of this pedunculated mass revealed that there was disease crossing the perineal body extending up to the right labia and into the vaginal introitus. This was along the area of prior surgical resection. This did not appear to extend to the urethral meatus but full visualization of this was somewhat difficult given the bulkiness of the tumor and the friability. It was not felt that this was resectable based on the extent of disease and size of the lesion.    05/16/2020 Pathology Results   1.  Right inguinal lymph node, resection:      -Two lymph nodes, negative for carcinoma (0/2). 2.  Left inguinal lymph node, resection:      -Three lymph nodes, negative for carcinoma (0/3).   06/06/2020 Cancer Staging   Staging form: Vulva, AJCC 8th Edition - Clinical stage from 06/06/2020: Stage Unknown (rcTX, cN0, cM0) - Signed by Heath Lark, MD on 06/06/2020   06/13/2020 Procedure   Technically successful right IJ power-injectable port catheter placement. Ready for routine use   06/20/2020 -  Chemotherapy   The patient had palonosetron (ALOXI) injection 0.25 mg, 0.25 mg, Intravenous,  Once, 4 of 5 cycles Administration: 0.25 mg (06/20/2020), 0.25 mg (06/27/2020), 0.25 mg (07/04/2020), 0.25 mg (  07/11/2020) CISplatin (PLATINOL) 63 mg in sodium chloride 0.9 % 250 mL chemo infusion, 40 mg/m2 = 63 mg, Intravenous,  Once, 4 of 5 cycles Dose modification: 30 mg/m2 (75 % of original dose 40 mg/m2, Cycle 4, Reason: Dose Not Tolerated) Administration: 63 mg (06/20/2020), 63 mg (06/27/2020), 63 mg (07/04/2020), 48 mg (07/11/2020) fosaprepitant (EMEND) 150 mg in sodium chloride 0.9 % 145 mL IVPB, 150 mg, Intravenous,  Once, 4 of 5 cycles Administration: 150 mg (06/20/2020), 150 mg (06/27/2020), 150 mg (07/04/2020), 150 mg (07/11/2020)  for chemotherapy  treatment.      REVIEW OF SYSTEMS:   Constitutional: Denies fevers, chills or abnormal weight loss Eyes: Denies blurriness of vision Ears, nose, mouth, throat, and face: Denies mucositis or sore throat Respiratory: Denies cough, dyspnea or wheezes Cardiovascular: Denies palpitation, chest discomfort or lower extremity swelling Gastrointestinal:  Denies nausea, heartburn or change in bowel habits Skin: Denies abnormal skin rashes Lymphatics: Denies new lymphadenopathy or easy bruising Neurological:Denies numbness, tingling or new weaknesses Behavioral/Psych: Mood is stable, no new changes  All other systems were reviewed with the patient and are negative.  I have reviewed the past medical history, past surgical history, social history and family history with the patient and they are unchanged from previous note.  ALLERGIES:  is allergic to duloxetine.  MEDICATIONS:  Current Outpatient Medications  Medication Sig Dispense Refill  . Cyanocobalamin (VITAMIN B 12 PO) Take 1,000 mcg by mouth daily.    Marland Kitchen donepezil (ARICEPT) 10 MG tablet Take 10 mg by mouth at bedtime.    . ergocalciferol (VITAMIN D2) 50000 UNITS capsule Take 50,000 Units by mouth every Tuesday.     . flecainide (TAMBOCOR) 50 MG tablet Take 1 tablet (50 mg total) by mouth 2 (two) times daily. 180 tablet 3  . HYDROcodone-acetaminophen (NORCO/VICODIN) 5-325 MG tablet Take 1 tablet by mouth every 6 (six) hours as needed. 30 tablet 0  . lidocaine-prilocaine (EMLA) cream Apply to affected area once 30 g 3  . metoprolol tartrate (LOPRESSOR) 25 MG tablet Take 1 tablet (25 mg total) by mouth 2 (two) times daily. 180 tablet 2  . mirtazapine (REMERON) 15 MG tablet Take 1 tablet (15 mg total) by mouth at bedtime. 30 tablet 11  . Omega-3 Fatty Acids (FISH OIL) 1200 MG CAPS Take 2 capsules by mouth every morning.    . ondansetron (ZOFRAN) 8 MG tablet Take 1 tablet (8 mg total) by mouth every 8 (eight) hours as needed. Start on the third  day after cisplatin chemotherapy. 30 tablet 1  . prochlorperazine (COMPAZINE) 10 MG tablet Take 1 tablet (10 mg total) by mouth every 6 (six) hours as needed (Nausea or vomiting). 30 tablet 1  . sertraline (ZOLOFT) 100 MG tablet Take 1 tablet (100 mg total) by mouth daily. 90 tablet 3   No current facility-administered medications for this visit.    PHYSICAL EXAMINATION: ECOG PERFORMANCE STATUS: 1 - Symptomatic but completely ambulatory  Vitals:   07/13/20 1344  BP: (!) 159/88  Pulse: 89  Resp: 17  Temp: 99.3 F (37.4 C)  SpO2: 100%   Filed Weights   07/13/20 1344  Weight: 136 lb 9.6 oz (62 kg)    GENERAL:alert, no distress and comfortable Musculoskeletal:no cyanosis of digits and no clubbing  NEURO: alert & oriented x 3 with fluent speech, no focal motor/sensory deficits  LABORATORY DATA:  I have reviewed the data as listed    Component Value Date/Time   NA 141 07/12/2020 1428   NA  139 09/01/2018 1550   NA 138 11/22/2013 1447   K 4.3 07/12/2020 1428   K 4.2 11/22/2013 1447   CL 107 07/12/2020 1428   CL 104 11/03/2012 1419   CO2 25 07/12/2020 1428   CO2 28 11/22/2013 1447   GLUCOSE 150 (H) 07/12/2020 1428   GLUCOSE 319 (H) 11/22/2013 1447   GLUCOSE 119 (H) 11/03/2012 1419   BUN 15 07/12/2020 1428   BUN 13 09/01/2018 1550   BUN 11.5 11/22/2013 1447   CREATININE 0.95 07/12/2020 1428   CREATININE 0.8 11/22/2013 1447   CALCIUM 9.4 07/12/2020 1428   CALCIUM 10.0 11/22/2013 1447   PROT 8.3 (H) 06/13/2020 1345   PROT 6.9 11/22/2013 1447   ALBUMIN 3.8 06/13/2020 1345   ALBUMIN 3.5 11/22/2013 1447   AST 16 06/13/2020 1345   AST 11 11/22/2013 1447   ALT 9 06/13/2020 1345   ALT 13 11/22/2013 1447   ALKPHOS 66 06/13/2020 1345   ALKPHOS 94 11/22/2013 1447   BILITOT 0.6 06/13/2020 1345   BILITOT 0.20 11/22/2013 1447   GFRNONAA 58 (L) 07/12/2020 1428   GFRAA >60 06/21/2020 1423    No results found for: SPEP, UPEP  Lab Results  Component Value Date   WBC 4.4  07/12/2020   NEUTROABS 3.2 07/12/2020   HGB 9.8 (L) 07/12/2020   HCT 30.2 (L) 07/12/2020   MCV 85.8 07/12/2020   PLT 101 (L) 07/12/2020      Chemistry      Component Value Date/Time   NA 141 07/12/2020 1428   NA 139 09/01/2018 1550   NA 138 11/22/2013 1447   K 4.3 07/12/2020 1428   K 4.2 11/22/2013 1447   CL 107 07/12/2020 1428   CL 104 11/03/2012 1419   CO2 25 07/12/2020 1428   CO2 28 11/22/2013 1447   BUN 15 07/12/2020 1428   BUN 13 09/01/2018 1550   BUN 11.5 11/22/2013 1447   CREATININE 0.95 07/12/2020 1428   CREATININE 0.8 11/22/2013 1447      Component Value Date/Time   CALCIUM 9.4 07/12/2020 1428   CALCIUM 10.0 11/22/2013 1447   ALKPHOS 66 06/13/2020 1345   ALKPHOS 94 11/22/2013 1447   AST 16 06/13/2020 1345   AST 11 11/22/2013 1447   ALT 9 06/13/2020 1345   ALT 13 11/22/2013 1447   BILITOT 0.6 06/13/2020 1345   BILITOT 0.20 11/22/2013 1447

## 2020-07-13 NOTE — Assessment & Plan Note (Signed)
After recent blood transfusion, her hemoglobin is stable She does not need further blood transfusion for now

## 2020-07-13 NOTE — Assessment & Plan Note (Signed)
She is doing better this week After blood transfusion, she felt stronger She denies nausea, constipation or side effects from recent treatment No peripheral neuropathy She has gained a bit of weight since last time I saw her She has less pelvic pain We will proceed with treatment as scheduled next week

## 2020-07-13 NOTE — Assessment & Plan Note (Signed)
This is likely due to recent treatment. The patient denies recent history of bleeding such as epistaxis, hematuria or hematochezia. She is asymptomatic from the low platelet count. I will observe for now.  she does not require transfusion now. I will continue the chemotherapy at current dose without dosage adjustment.  If the thrombocytopenia gets progressive worse in the future, I might have to delay her treatment or adjust the chemotherapy dose.   

## 2020-07-14 ENCOUNTER — Ambulatory Visit
Admission: RE | Admit: 2020-07-14 | Discharge: 2020-07-14 | Disposition: A | Discharge status: Home / Self Care | Payer: Medicare PPO | Source: Ambulatory Visit | Attending: Radiation Oncology | Admitting: Radiation Oncology

## 2020-07-14 DIAGNOSIS — C519 Malignant neoplasm of vulva, unspecified: Secondary | ICD-10-CM | POA: Diagnosis not present

## 2020-07-14 DIAGNOSIS — Z510 Encounter for antineoplastic radiation therapy: Secondary | ICD-10-CM | POA: Diagnosis not present

## 2020-07-14 DIAGNOSIS — C518 Malignant neoplasm of overlapping sites of vulva: Secondary | ICD-10-CM | POA: Diagnosis not present

## 2020-07-17 ENCOUNTER — Ambulatory Visit: Payer: Medicare PPO

## 2020-07-17 ENCOUNTER — Other Ambulatory Visit: Payer: Self-pay

## 2020-07-17 ENCOUNTER — Ambulatory Visit
Admission: RE | Admit: 2020-07-17 | Discharge: 2020-07-17 | Disposition: A | Discharge status: Home / Self Care | Payer: Medicare PPO | Source: Ambulatory Visit | Attending: Radiation Oncology | Admitting: Radiation Oncology

## 2020-07-17 DIAGNOSIS — C518 Malignant neoplasm of overlapping sites of vulva: Secondary | ICD-10-CM | POA: Diagnosis not present

## 2020-07-17 DIAGNOSIS — Z510 Encounter for antineoplastic radiation therapy: Secondary | ICD-10-CM | POA: Diagnosis not present

## 2020-07-17 DIAGNOSIS — C519 Malignant neoplasm of vulva, unspecified: Secondary | ICD-10-CM | POA: Diagnosis not present

## 2020-07-18 ENCOUNTER — Ambulatory Visit (INDEPENDENT_AMBULATORY_CARE_PROVIDER_SITE_OTHER): Payer: Medicare PPO

## 2020-07-18 ENCOUNTER — Ambulatory Visit
Admission: RE | Admit: 2020-07-18 | Discharge: 2020-07-18 | Disposition: A | Discharge status: Home / Self Care | Payer: Medicare PPO | Source: Ambulatory Visit | Attending: Radiation Oncology | Admitting: Radiation Oncology

## 2020-07-18 ENCOUNTER — Other Ambulatory Visit: Payer: Self-pay

## 2020-07-18 ENCOUNTER — Inpatient Hospital Stay: Payer: Medicare PPO

## 2020-07-18 VITALS — BP 142/71 | HR 71 | Temp 98.3°F | Resp 17 | Wt 127.5 lb

## 2020-07-18 DIAGNOSIS — C518 Malignant neoplasm of overlapping sites of vulva: Secondary | ICD-10-CM | POA: Diagnosis not present

## 2020-07-18 DIAGNOSIS — D696 Thrombocytopenia, unspecified: Secondary | ICD-10-CM | POA: Diagnosis not present

## 2020-07-18 DIAGNOSIS — Z79899 Other long term (current) drug therapy: Secondary | ICD-10-CM | POA: Diagnosis not present

## 2020-07-18 DIAGNOSIS — C519 Malignant neoplasm of vulva, unspecified: Secondary | ICD-10-CM | POA: Diagnosis not present

## 2020-07-18 DIAGNOSIS — D6481 Anemia due to antineoplastic chemotherapy: Secondary | ICD-10-CM | POA: Diagnosis not present

## 2020-07-18 DIAGNOSIS — G893 Neoplasm related pain (acute) (chronic): Secondary | ICD-10-CM | POA: Diagnosis not present

## 2020-07-18 DIAGNOSIS — Z510 Encounter for antineoplastic radiation therapy: Secondary | ICD-10-CM | POA: Diagnosis not present

## 2020-07-18 DIAGNOSIS — Z7189 Other specified counseling: Secondary | ICD-10-CM

## 2020-07-18 DIAGNOSIS — I442 Atrioventricular block, complete: Secondary | ICD-10-CM | POA: Diagnosis not present

## 2020-07-18 DIAGNOSIS — R634 Abnormal weight loss: Secondary | ICD-10-CM | POA: Diagnosis not present

## 2020-07-18 DIAGNOSIS — Z5111 Encounter for antineoplastic chemotherapy: Secondary | ICD-10-CM | POA: Diagnosis not present

## 2020-07-18 LAB — CUP PACEART REMOTE DEVICE CHECK
Battery Remaining Longevity: 93 mo
Battery Voltage: 2.99 V
Brady Statistic AP VP Percent: 25.11 %
Brady Statistic AP VS Percent: 0.07 %
Brady Statistic AS VP Percent: 70.32 %
Brady Statistic AS VS Percent: 4.5 %
Brady Statistic RA Percent Paced: 26.6 %
Brady Statistic RV Percent Paced: 95.43 %
Date Time Interrogation Session: 20211025205234
Implantable Lead Implant Date: 20191024
Implantable Lead Implant Date: 20191024
Implantable Lead Location: 753859
Implantable Lead Location: 753860
Implantable Lead Model: 3830
Implantable Lead Model: 5076
Implantable Pulse Generator Implant Date: 20191024
Lead Channel Impedance Value: 266 Ohm
Lead Channel Impedance Value: 304 Ohm
Lead Channel Impedance Value: 323 Ohm
Lead Channel Impedance Value: 418 Ohm
Lead Channel Pacing Threshold Amplitude: 0.5 V
Lead Channel Pacing Threshold Amplitude: 1.5 V
Lead Channel Pacing Threshold Pulse Width: 0.4 ms
Lead Channel Pacing Threshold Pulse Width: 0.4 ms
Lead Channel Sensing Intrinsic Amplitude: 2.625 mV
Lead Channel Sensing Intrinsic Amplitude: 2.625 mV
Lead Channel Sensing Intrinsic Amplitude: 7.125 mV
Lead Channel Sensing Intrinsic Amplitude: 7.125 mV
Lead Channel Setting Pacing Amplitude: 2 V
Lead Channel Setting Pacing Amplitude: 3 V
Lead Channel Setting Pacing Pulse Width: 0.4 ms
Lead Channel Setting Sensing Sensitivity: 1.2 mV

## 2020-07-18 MED ORDER — SODIUM CHLORIDE 0.9% FLUSH
10.0000 mL | INTRAVENOUS | Status: DC | PRN
  Administered 2020-07-18: 10 mL
  Filled 2020-07-18: qty 10

## 2020-07-18 MED ORDER — PALONOSETRON HCL INJECTION 0.25 MG/5ML
INTRAVENOUS | Status: AC
  Filled 2020-07-18: qty 5

## 2020-07-18 MED ORDER — SODIUM CHLORIDE 0.9 % IV SOLN
30.0000 mg/m2 | Freq: Once | INTRAVENOUS | Status: AC
  Administered 2020-07-18: 49 mg via INTRAVENOUS
  Filled 2020-07-18: qty 49

## 2020-07-18 MED ORDER — SODIUM CHLORIDE 0.9 % IV SOLN
10.0000 mg | Freq: Once | INTRAVENOUS | Status: AC
  Administered 2020-07-18: 10 mg via INTRAVENOUS
  Filled 2020-07-18: qty 10

## 2020-07-18 MED ORDER — SODIUM CHLORIDE 0.9 % IV SOLN
150.0000 mg | Freq: Once | INTRAVENOUS | Status: AC
  Administered 2020-07-18: 150 mg via INTRAVENOUS
  Filled 2020-07-18: qty 150

## 2020-07-18 MED ORDER — SODIUM CHLORIDE 0.9 % IV SOLN
Freq: Once | INTRAVENOUS | Status: AC
  Filled 2020-07-18: qty 10

## 2020-07-18 MED ORDER — SODIUM CHLORIDE 0.9 % IV SOLN
Freq: Once | INTRAVENOUS | Status: AC
  Filled 2020-07-18: qty 250

## 2020-07-18 MED ORDER — PALONOSETRON HCL INJECTION 0.25 MG/5ML
0.2500 mg | Freq: Once | INTRAVENOUS | Status: AC
  Administered 2020-07-18: 0.25 mg via INTRAVENOUS

## 2020-07-18 MED ORDER — HEPARIN SOD (PORK) LOCK FLUSH 100 UNIT/ML IV SOLN
500.0000 [IU] | Freq: Once | INTRAVENOUS | Status: AC | PRN
  Administered 2020-07-18: 500 [IU]
  Filled 2020-07-18: qty 5

## 2020-07-18 NOTE — Patient Instructions (Signed)
[facility] Discharge Instructions for Patients Receiving Chemotherapy  Today you received the following chemotherapy agents cisplatin  To help prevent nausea and vomiting after your treatment, we encourage you to take your nausea medication as directed   If you develop nausea and vomiting that is not controlled by your nausea medication, call the clinic.   BELOW ARE SYMPTOMS THAT SHOULD BE REPORTED IMMEDIATELY:  *FEVER GREATER THAN 100.5 F  *CHILLS WITH OR WITHOUT FEVER  NAUSEA AND VOMITING THAT IS NOT CONTROLLED WITH YOUR NAUSEA MEDICATION  *UNUSUAL SHORTNESS OF BREATH  *UNUSUAL BRUISING OR BLEEDING  TENDERNESS IN MOUTH AND THROAT WITH OR WITHOUT PRESENCE OF ULCERS  *URINARY PROBLEMS  *BOWEL PROBLEMS  UNUSUAL RASH Items with * indicate a potential emergency and should be followed up as soon as possible.  Feel free to call the clinic should you have any questions or concerns. The clinic phone number is [phone].  Please show the CHEMO ALERT CARD at check-in to the Emergency Department and triage nurse.   

## 2020-07-18 NOTE — Progress Notes (Signed)
Per Dr. Alvy Bimler, okay to continue with treatment without urine output.  1L NS to run with Cisplatin over 1 hour prior to 2 hour post-hydration fluids.

## 2020-07-19 ENCOUNTER — Inpatient Hospital Stay: Payer: Medicare PPO

## 2020-07-19 ENCOUNTER — Ambulatory Visit
Admission: RE | Admit: 2020-07-19 | Discharge: 2020-07-19 | Disposition: A | Discharge status: Home / Self Care | Payer: Medicare PPO | Source: Ambulatory Visit | Attending: Radiation Oncology | Admitting: Radiation Oncology

## 2020-07-19 DIAGNOSIS — Z5111 Encounter for antineoplastic chemotherapy: Secondary | ICD-10-CM | POA: Diagnosis not present

## 2020-07-19 DIAGNOSIS — C519 Malignant neoplasm of vulva, unspecified: Secondary | ICD-10-CM

## 2020-07-19 DIAGNOSIS — Z510 Encounter for antineoplastic radiation therapy: Secondary | ICD-10-CM | POA: Diagnosis not present

## 2020-07-19 DIAGNOSIS — Z7189 Other specified counseling: Secondary | ICD-10-CM

## 2020-07-19 DIAGNOSIS — T451X5A Adverse effect of antineoplastic and immunosuppressive drugs, initial encounter: Secondary | ICD-10-CM

## 2020-07-19 DIAGNOSIS — D6481 Anemia due to antineoplastic chemotherapy: Secondary | ICD-10-CM

## 2020-07-19 DIAGNOSIS — G893 Neoplasm related pain (acute) (chronic): Secondary | ICD-10-CM | POA: Diagnosis not present

## 2020-07-19 DIAGNOSIS — R634 Abnormal weight loss: Secondary | ICD-10-CM | POA: Diagnosis not present

## 2020-07-19 DIAGNOSIS — C518 Malignant neoplasm of overlapping sites of vulva: Secondary | ICD-10-CM | POA: Diagnosis not present

## 2020-07-19 DIAGNOSIS — Z79899 Other long term (current) drug therapy: Secondary | ICD-10-CM | POA: Diagnosis not present

## 2020-07-19 DIAGNOSIS — D696 Thrombocytopenia, unspecified: Secondary | ICD-10-CM | POA: Diagnosis not present

## 2020-07-19 LAB — BASIC METABOLIC PANEL - CANCER CENTER ONLY
Anion gap: 10 (ref 5–15)
BUN: 15 mg/dL (ref 8–23)
CO2: 25 mmol/L (ref 22–32)
Calcium: 9.1 mg/dL (ref 8.9–10.3)
Chloride: 106 mmol/L (ref 98–111)
Creatinine: 0.9 mg/dL (ref 0.44–1.00)
GFR, Estimated: >60 mL/min (ref >60)
Glucose, Bld: 114 mg/dL — ABNORMAL HIGH (ref 70–99)
Potassium: 3.8 mmol/L (ref 3.5–5.1)
Sodium: 141 mmol/L (ref 135–145)

## 2020-07-19 LAB — CBC WITH DIFFERENTIAL (CANCER CENTER ONLY)
Abs Immature Granulocytes: 0.03 10*3/uL (ref 0.00–0.07)
Basophils Absolute: 0 10*3/uL (ref 0.0–0.1)
Basophils Relative: 0 %
Eosinophils Absolute: 0 10*3/uL (ref 0.0–0.5)
Eosinophils Relative: 0 %
HCT: 29.1 % — ABNORMAL LOW (ref 36.0–46.0)
Hemoglobin: 9.5 g/dL — ABNORMAL LOW (ref 12.0–15.0)
Immature Granulocytes: 1 %
Lymphocytes Relative: 16 %
Lymphs Abs: 0.7 10*3/uL (ref 0.7–4.0)
MCH: 27.8 pg (ref 26.0–34.0)
MCHC: 32.6 g/dL (ref 30.0–36.0)
MCV: 85.1 fL (ref 80.0–100.0)
Monocytes Absolute: 0.5 10*3/uL (ref 0.1–1.0)
Monocytes Relative: 11 %
Neutro Abs: 3.1 10*3/uL (ref 1.7–7.7)
Neutrophils Relative %: 72 %
Platelet Count: 80 10*3/uL — ABNORMAL LOW (ref 150–400)
RBC: 3.42 MIL/uL — ABNORMAL LOW (ref 3.87–5.11)
RDW: 13.4 % (ref 11.5–15.5)
WBC Count: 4.3 10*3/uL (ref 4.0–10.5)
nRBC: 0 % (ref 0.0–0.2)

## 2020-07-19 LAB — MAGNESIUM: Magnesium: 1.6 mg/dL — ABNORMAL LOW (ref 1.7–2.4)

## 2020-07-19 LAB — SAMPLE TO BLOOD BANK

## 2020-07-20 ENCOUNTER — Encounter: Payer: Self-pay | Admitting: Hematology and Oncology

## 2020-07-20 ENCOUNTER — Ambulatory Visit: Payer: Medicare PPO | Admitting: Psychology

## 2020-07-20 ENCOUNTER — Other Ambulatory Visit: Payer: Self-pay

## 2020-07-20 ENCOUNTER — Ambulatory Visit
Admission: RE | Admit: 2020-07-20 | Discharge: 2020-07-20 | Disposition: A | Discharge status: Home / Self Care | Payer: Medicare PPO | Source: Ambulatory Visit | Attending: Radiation Oncology | Admitting: Radiation Oncology

## 2020-07-20 ENCOUNTER — Inpatient Hospital Stay: Payer: Medicare PPO | Admitting: Hematology and Oncology

## 2020-07-20 ENCOUNTER — Encounter: Payer: Self-pay | Admitting: Radiation Oncology

## 2020-07-20 DIAGNOSIS — D6481 Anemia due to antineoplastic chemotherapy: Secondary | ICD-10-CM

## 2020-07-20 DIAGNOSIS — R634 Abnormal weight loss: Secondary | ICD-10-CM

## 2020-07-20 DIAGNOSIS — C519 Malignant neoplasm of vulva, unspecified: Secondary | ICD-10-CM | POA: Diagnosis not present

## 2020-07-20 DIAGNOSIS — T451X5A Adverse effect of antineoplastic and immunosuppressive drugs, initial encounter: Secondary | ICD-10-CM | POA: Diagnosis not present

## 2020-07-20 DIAGNOSIS — Z510 Encounter for antineoplastic radiation therapy: Secondary | ICD-10-CM | POA: Diagnosis not present

## 2020-07-20 DIAGNOSIS — D696 Thrombocytopenia, unspecified: Secondary | ICD-10-CM

## 2020-07-20 DIAGNOSIS — Z5111 Encounter for antineoplastic chemotherapy: Secondary | ICD-10-CM | POA: Diagnosis not present

## 2020-07-20 DIAGNOSIS — Z79899 Other long term (current) drug therapy: Secondary | ICD-10-CM | POA: Diagnosis not present

## 2020-07-20 DIAGNOSIS — G893 Neoplasm related pain (acute) (chronic): Secondary | ICD-10-CM | POA: Diagnosis not present

## 2020-07-20 DIAGNOSIS — C518 Malignant neoplasm of overlapping sites of vulva: Secondary | ICD-10-CM | POA: Diagnosis not present

## 2020-07-20 NOTE — Assessment & Plan Note (Signed)
This is due to side effects of treatment She is not symptomatic She does not need further blood transfusion for now

## 2020-07-20 NOTE — Assessment & Plan Note (Signed)
This is likely due to recent treatment. The patient denies recent history of bleeding such as epistaxis, hematuria or hematochezia. She is asymptomatic from the low platelet count. I will observe for now.  

## 2020-07-20 NOTE — Progress Notes (Signed)
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 The patient denies recent history of bleeding such as epistaxis, hematuria or hematochezia. She is asymptomatic from the low platelet count. I will observe for now.    Anemia due to antineoplastic chemotherapy This is due to side effects of treatment She is not symptomatic She does not need further blood transfusion for now   No orders of the defined types were placed in this encounter.   All questions were answered. The patient knows to call the clinic with any problems, questions or concerns. The total time spent in the appointment was 20 minutes encounter with patients including review of chart and various tests results, discussions about plan of care and coordination of care plan   Heath Lark, MD 07/20/2020 3:13 PM  INTERVAL HISTORY: Please see below for problem oriented charting. She is seen today for weekly supportive care and review of treatment toxicity She completed cycle 5 of treatment this week As usual, she had very poor oral intake and appears dehydrated during treatment, requiring aggressive fluid resuscitation She continues to have progressive weight loss Her son is not present with her today but I did talk to him over the phone She denies nausea No recent changes in bowel habits She has minimum pain The patient denies any recent  signs or symptoms of bleeding such as spontaneous epistaxis, hematuria or hematochezia.   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 Freddi Che), multiple vulvar re-excision  Surgeon: Margaretmary Bayley, MD; Andi Devon MD  Findings: Bilateral sentinel LN identified, both hot and blue. See Dr. Bethel Born note for full details. Posterior vulva with evidence of partial wound breakdown without evidence of infection. Granulation tissue noted with exposed subcutaneous sutures. 2x2cm concerning nodular plaque at 7 o'clock, medial margin <1cm from anus. Rectal exam at conclusion normal.   Specimens: 1. Left vulvar margin 2. Right vulvar margin 3. Left sentinel inguinal LN 4. Right sentinel inguinal LN 5. Vulvar lesion at 7 oclock, stitch at 12 oclock    03/04/2016 Pathology Results   A: Lymph node, right inguinal sentinel #2, removal - 1 lymph node negative for metastatic carcinoma (0/1). - Pan cytokeratin confirmatory (negative).   B: Lymph node, right inguinal sentinel #1, removal  - 2 lymph nodes, negative for metastatic carcinoma (0/2). - Pan cytokeratin confirmatory (negative).   C: Lymph node, left inguinal  sentinel #1, removal  - 1 lymph node negative for metastatic carcinoma (0/1). - Pan cytokeratin confirmatory (negative).   D: Vulva, left margin, biopsy  - Ulcerated squamous  tissue and granulation tissue. - Acute inflammation and suture granuloma. - No dysplasia or malignancy identified.   E: Vulva, right margin, biopsy  - Skin and subcutaneous tissue with fat necrosis, ulceration, fibrosis and granulation tissue. - No dysplasia or malignancy identified.   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 - Margins for invasive carcinoma are negative (deep 4 mm, 3 o'clock half 5 mm and 9 o'clock half 7 mm) - Margins for HSIL:  3-o'clock half positive for VIN 2. - Other findings: Fat necrosis, fibrosis and chronic inflammation   04/11/2020 Pathology Results   1. Vulva, biopsy, top - AT LEAST SQUAMOUS CELL CARCINOMA IN SITU, SUSPICIOUS FOR INVASION. SEE NOTE 2. Vulva, biopsy, middle - SQUAMOUS CARCINOMA IN SITU. SEE NOTE 3. Vulva, biopsy, bottom - SQUAMOUS CARCINOMA IN SITU. SEE NOTE   05/03/2020 PET scan   Hypermetabolic bilateral vulvar skin thickening max SUV 19, consistent with patient's known malignancy.   Hypermetabolic bilateral inguinal lymph nodes.  Right inguinal node measuring 16 x 25 mm max SUV 6.9  Left inguinal lymph node measuring 12 x 15 mm max SUV 5.3   No other worrisome hypermetabolic activity identified.   Additional findings:  Lobulated bilateral renal cortex suggesting scarring.  Postop change with the left breast.   05/16/2020 Surgery   Surgeon(s) and Role: * Hart Rochester, MD - Primary  Specimens:  ID Type Source Tests Collected by Time  1 : RIGHT INGUINAL Tissue Lymph Node PATHOLOGY TISSUE REQUEST Hart Rochester, MD 05/16/2020 [phone]  2 : LEFT INGUINAL Tissue Lymph Node PATHOLOGY TISSUE REQUEST Hart Rochester, MD 05/16/2020 [phone]    Findings:   Bilateral inguinal nodes c/w metastatic disease. On exam under anesthesia, the patient was noted to have a large pedunculated mass emanating from the left labia. This was fairly mobile and did not appear to be deeply invasive. Further evaluation of this pedunculated mass revealed that there was disease crossing the perineal body extending up to the right labia and into the vaginal introitus. This was along the area of prior surgical resection. This did not appear to extend to the urethral meatus but full visualization of this was somewhat difficult given the bulkiness of the tumor and the friability. It was not felt that this was resectable based on the extent of disease and size of the lesion.    05/16/2020 Pathology Results   1.  Right inguinal lymph node, resection:      -Two lymph nodes, negative for carcinoma (0/2). 2.  Left inguinal lymph node, resection:      -Three lymph nodes, negative for carcinoma (0/3).   06/06/2020 Cancer Staging   Staging form: Vulva, AJCC 8th Edition - Clinical stage from 06/06/2020: Stage Unknown (rcTX, cN0, cM0) - Signed by Heath Lark, MD on 06/06/2020   06/13/2020 Procedure   Technically successful right IJ power-injectable port catheter placement. Ready for routine use   06/20/2020 -  Chemotherapy   The patient had palonosetron (ALOXI) injection 0.25 mg, 0.25 mg, Intravenous,  Once, 5 of 5 cycles Administration: 0.25 mg (06/20/2020),  0.25 mg (06/27/2020), 0.25 mg (07/04/2020), 0.25 mg (07/11/2020), 0.25 mg (07/18/2020) CISplatin (PLATINOL) 63 mg in sodium chloride 0.9 % 250 mL chemo infusion, 40 mg/m2 = 63 mg, Intravenous,  Once, 5 of 5 cycles Dose modification: 30 mg/m2 (75 % of original dose 40 mg/m2, Cycle 4, Reason: Dose Not Tolerated) Administration: 63 mg (06/20/2020), 63 mg (06/27/2020), 63 mg (07/04/2020), 48 mg (07/11/2020), 49 mg (07/18/2020) fosaprepitant (EMEND) 150 mg in sodium chloride 0.9 % 145 mL IVPB, 150 mg, Intravenous,  Once, 5 of 5  cycles Administration: 150 mg (06/20/2020), 150 mg (06/27/2020), 150 mg (07/04/2020), 150 mg (07/11/2020), 150 mg (07/18/2020)  for chemotherapy treatment.      REVIEW OF SYSTEMS:   Constitutional: Denies fevers, chills  Eyes: Denies blurriness of vision Ears, nose, mouth, throat, and face: Denies mucositis or sore throat Respiratory: Denies cough, dyspnea or wheezes Cardiovascular: Denies palpitation, chest discomfort or lower extremity swelling Gastrointestinal:  Denies nausea, heartburn or change in bowel habits Skin: Denies abnormal skin rashes Lymphatics: Denies new lymphadenopathy or easy bruising Neurological:Denies numbness, tingling or new weaknesses Behavioral/Psych: Mood is stable, no new changes  All other systems were reviewed with the patient and are negative.  I have reviewed the past medical history, past surgical history, social history and family history with the patient and they are unchanged from previous note.  ALLERGIES:  is allergic to duloxetine.  MEDICATIONS:  Current Outpatient Medications  Medication Sig Dispense Refill  . Cyanocobalamin (VITAMIN B 12 PO) Take 1,000 mcg by mouth daily.    Marland Kitchen donepezil (ARICEPT) 10 MG tablet Take 10 mg by mouth at bedtime.    . ergocalciferol (VITAMIN D2) 50000 UNITS capsule Take 50,000 Units by mouth every Tuesday.     . flecainide (TAMBOCOR) 50 MG tablet Take 1 tablet (50 mg total) by mouth 2 (two) times daily. 180 tablet 3  . HYDROcodone-acetaminophen (NORCO/VICODIN) 5-325 MG tablet Take 1 tablet by mouth every 6 (six) hours as needed. 30 tablet 0  . lidocaine-prilocaine (EMLA) cream Apply to affected area once 30 g 3  . metoprolol tartrate (LOPRESSOR) 25 MG tablet Take 1 tablet (25 mg total) by mouth 2 (two) times daily. 180 tablet 2  . mirtazapine (REMERON) 15 MG tablet Take 1 tablet (15 mg total) by mouth at bedtime. 30 tablet 11  . Omega-3 Fatty Acids (FISH OIL) 1200 MG CAPS Take 2 capsules by mouth every morning.    .  ondansetron (ZOFRAN) 8 MG tablet Take 1 tablet (8 mg total) by mouth every 8 (eight) hours as needed. Start on the third day after cisplatin chemotherapy. 30 tablet 1  . prochlorperazine (COMPAZINE) 10 MG tablet Take 1 tablet (10 mg total) by mouth every 6 (six) hours as needed (Nausea or vomiting). 30 tablet 1  . sertraline (ZOLOFT) 100 MG tablet Take 1 tablet (100 mg total) by mouth daily. 90 tablet 3   No current facility-administered medications for this visit.    PHYSICAL EXAMINATION: ECOG PERFORMANCE STATUS: 1 - Symptomatic but completely ambulatory  Vitals:   07/20/20 1400  BP: (!) 160/80  Pulse: 85  Resp: 20  Temp: 98.6 F (37 C)  SpO2: 100%   Filed Weights   07/20/20 1400  Weight: 132 lb 6.4 oz (60.1 kg)    GENERAL:alert, no distress and comfortable NEURO: alert & oriented x 3 with fluent speech, no focal motor/sensory deficits  LABORATORY DATA:  I have reviewed the data as listed    Component Value Date/Time   NA  141 07/19/2020 1409   NA 139 09/01/2018 1550   NA 138 11/22/2013 1447   K 3.8 07/19/2020 1409   K 4.2 11/22/2013 1447   CL 106 07/19/2020 1409   CL 104 11/03/2012 1419   CO2 25 07/19/2020 1409   CO2 28 11/22/2013 1447   GLUCOSE 114 (H) 07/19/2020 1409   GLUCOSE 319 (H) 11/22/2013 1447   GLUCOSE 119 (H) 11/03/2012 1419   BUN 15 07/19/2020 1409   BUN 13 09/01/2018 1550   BUN 11.5 11/22/2013 1447   CREATININE 0.90 07/19/2020 1409   CREATININE 0.8 11/22/2013 1447   CALCIUM 9.1 07/19/2020 1409   CALCIUM 10.0 11/22/2013 1447   PROT 8.3 (H) 06/13/2020 1345   PROT 6.9 11/22/2013 1447   ALBUMIN 3.8 06/13/2020 1345   ALBUMIN 3.5 11/22/2013 1447   AST 16 06/13/2020 1345   AST 11 11/22/2013 1447   ALT 9 06/13/2020 1345   ALT 13 11/22/2013 1447   ALKPHOS 66 06/13/2020 1345   ALKPHOS 94 11/22/2013 1447   BILITOT 0.6 06/13/2020 1345   BILITOT 0.20 11/22/2013 1447   GFRNONAA >60 07/19/2020 1409   GFRAA >60 06/21/2020 1423    No results found for:  SPEP, UPEP  Lab Results  Component Value Date   WBC 4.3 07/19/2020   NEUTROABS 3.1 07/19/2020   HGB 9.5 (L) 07/19/2020   HCT 29.1 (L) 07/19/2020   MCV 85.1 07/19/2020   PLT 80 (L) 07/19/2020      Chemistry      Component Value Date/Time   NA 141 07/19/2020 1409   NA 139 09/01/2018 1550   NA 138 11/22/2013 1447   K 3.8 07/19/2020 1409   K 4.2 11/22/2013 1447   CL 106 07/19/2020 1409   CL 104 11/03/2012 1419   CO2 25 07/19/2020 1409   CO2 28 11/22/2013 1447   BUN 15 07/19/2020 1409   BUN 13 09/01/2018 1550   BUN 11.5 11/22/2013 1447   CREATININE 0.90 07/19/2020 1409   CREATININE 0.8 11/22/2013 1447      Component Value Date/Time   CALCIUM 9.1 07/19/2020 1409   CALCIUM 10.0 11/22/2013 1447   ALKPHOS 66 06/13/2020 1345   ALKPHOS 94 11/22/2013 1447   AST 16 06/13/2020 1345   AST 11 11/22/2013 1447   ALT 9 06/13/2020 1345   ALT 13 11/22/2013 1447   BILITOT 0.6 06/13/2020 1345   BILITOT 0.20 11/22/2013 1447

## 2020-07-20 NOTE — Assessment & Plan Note (Signed)
She has progressive weight loss again due to poor appetite We discussed the importance of increased oral intake as tolerated

## 2020-07-20 NOTE — Assessment & Plan Note (Signed)
She completed final treatment this week She is developing pancytopenia but not unusual side effects from treatment Overall, she is not symptomatic She is scheduled to return next week for repeat blood work and exam She will continue radiation as scheduled

## 2020-07-21 ENCOUNTER — Ambulatory Visit
Admission: RE | Admit: 2020-07-21 | Discharge: 2020-07-21 | Disposition: A | Discharge status: Home / Self Care | Payer: Medicare PPO | Source: Ambulatory Visit | Attending: Radiation Oncology | Admitting: Radiation Oncology

## 2020-07-21 DIAGNOSIS — Z510 Encounter for antineoplastic radiation therapy: Secondary | ICD-10-CM | POA: Diagnosis not present

## 2020-07-21 DIAGNOSIS — C518 Malignant neoplasm of overlapping sites of vulva: Secondary | ICD-10-CM | POA: Diagnosis not present

## 2020-07-21 DIAGNOSIS — C519 Malignant neoplasm of vulva, unspecified: Secondary | ICD-10-CM | POA: Diagnosis not present

## 2020-07-24 ENCOUNTER — Ambulatory Visit
Admission: RE | Admit: 2020-07-24 | Discharge: 2020-07-24 | Disposition: A | Discharge status: Home / Self Care | Payer: Medicare PPO | Source: Ambulatory Visit | Attending: Radiation Oncology | Admitting: Radiation Oncology

## 2020-07-24 DIAGNOSIS — C519 Malignant neoplasm of vulva, unspecified: Secondary | ICD-10-CM | POA: Insufficient documentation

## 2020-07-24 DIAGNOSIS — C518 Malignant neoplasm of overlapping sites of vulva: Secondary | ICD-10-CM | POA: Diagnosis not present

## 2020-07-24 DIAGNOSIS — Z510 Encounter for antineoplastic radiation therapy: Secondary | ICD-10-CM | POA: Diagnosis not present

## 2020-07-24 NOTE — Progress Notes (Signed)
Remote pacemaker transmission.   

## 2020-07-25 ENCOUNTER — Ambulatory Visit
Admission: RE | Admit: 2020-07-25 | Discharge: 2020-07-25 | Disposition: A | Discharge status: Home / Self Care | Payer: Medicare PPO | Source: Ambulatory Visit | Attending: Radiation Oncology | Admitting: Radiation Oncology

## 2020-07-25 ENCOUNTER — Other Ambulatory Visit: Payer: Self-pay

## 2020-07-25 DIAGNOSIS — C518 Malignant neoplasm of overlapping sites of vulva: Secondary | ICD-10-CM | POA: Diagnosis not present

## 2020-07-25 DIAGNOSIS — C519 Malignant neoplasm of vulva, unspecified: Secondary | ICD-10-CM | POA: Diagnosis not present

## 2020-07-25 DIAGNOSIS — Z510 Encounter for antineoplastic radiation therapy: Secondary | ICD-10-CM | POA: Diagnosis not present

## 2020-07-26 ENCOUNTER — Ambulatory Visit: Payer: Medicare PPO | Admitting: Psychology

## 2020-07-26 ENCOUNTER — Ambulatory Visit
Admission: RE | Admit: 2020-07-26 | Discharge: 2020-07-26 | Disposition: A | Discharge status: Home / Self Care | Payer: Medicare PPO | Source: Ambulatory Visit | Attending: Radiation Oncology | Admitting: Radiation Oncology

## 2020-07-26 ENCOUNTER — Other Ambulatory Visit: Payer: Self-pay

## 2020-07-26 ENCOUNTER — Inpatient Hospital Stay: Payer: Medicare PPO | Attending: Hematology and Oncology

## 2020-07-26 DIAGNOSIS — G893 Neoplasm related pain (acute) (chronic): Secondary | ICD-10-CM | POA: Diagnosis not present

## 2020-07-26 DIAGNOSIS — C519 Malignant neoplasm of vulva, unspecified: Secondary | ICD-10-CM | POA: Insufficient documentation

## 2020-07-26 DIAGNOSIS — R634 Abnormal weight loss: Secondary | ICD-10-CM | POA: Diagnosis not present

## 2020-07-26 DIAGNOSIS — Z79899 Other long term (current) drug therapy: Secondary | ICD-10-CM | POA: Diagnosis not present

## 2020-07-26 DIAGNOSIS — Z9221 Personal history of antineoplastic chemotherapy: Secondary | ICD-10-CM | POA: Insufficient documentation

## 2020-07-26 DIAGNOSIS — C518 Malignant neoplasm of overlapping sites of vulva: Secondary | ICD-10-CM | POA: Diagnosis not present

## 2020-07-26 DIAGNOSIS — D6481 Anemia due to antineoplastic chemotherapy: Secondary | ICD-10-CM | POA: Insufficient documentation

## 2020-07-26 DIAGNOSIS — D696 Thrombocytopenia, unspecified: Secondary | ICD-10-CM | POA: Diagnosis not present

## 2020-07-26 DIAGNOSIS — Z510 Encounter for antineoplastic radiation therapy: Secondary | ICD-10-CM | POA: Diagnosis not present

## 2020-07-26 DIAGNOSIS — Z7189 Other specified counseling: Secondary | ICD-10-CM

## 2020-07-26 LAB — BASIC METABOLIC PANEL - CANCER CENTER ONLY
Anion gap: 10 (ref 5–15)
BUN: 15 mg/dL (ref 8–23)
CO2: 28 mmol/L (ref 22–32)
Calcium: 8.6 mg/dL — ABNORMAL LOW (ref 8.9–10.3)
Chloride: 104 mmol/L (ref 98–111)
Creatinine: 1 mg/dL (ref 0.44–1.00)
GFR, Estimated: 58 mL/min — ABNORMAL LOW (ref >60)
Glucose, Bld: 82 mg/dL (ref 70–99)
Potassium: 3.8 mmol/L (ref 3.5–5.1)
Sodium: 142 mmol/L (ref 135–145)

## 2020-07-26 LAB — CBC WITH DIFFERENTIAL (CANCER CENTER ONLY)
Abs Immature Granulocytes: 0.01 10*3/uL (ref 0.00–0.07)
Basophils Absolute: 0 10*3/uL (ref 0.0–0.1)
Basophils Relative: 0 %
Eosinophils Absolute: 0 10*3/uL (ref 0.0–0.5)
Eosinophils Relative: 0 %
HCT: 27.4 % — ABNORMAL LOW (ref 36.0–46.0)
Hemoglobin: 9 g/dL — ABNORMAL LOW (ref 12.0–15.0)
Immature Granulocytes: 0 %
Lymphocytes Relative: 18 %
Lymphs Abs: 0.6 10*3/uL — ABNORMAL LOW (ref 0.7–4.0)
MCH: 27.9 pg (ref 26.0–34.0)
MCHC: 32.8 g/dL (ref 30.0–36.0)
MCV: 84.8 fL (ref 80.0–100.0)
Monocytes Absolute: 0.4 10*3/uL (ref 0.1–1.0)
Monocytes Relative: 11 %
Neutro Abs: 2.3 10*3/uL (ref 1.7–7.7)
Neutrophils Relative %: 71 %
Platelet Count: 93 10*3/uL — ABNORMAL LOW (ref 150–400)
RBC: 3.23 MIL/uL — ABNORMAL LOW (ref 3.87–5.11)
RDW: 13.6 % (ref 11.5–15.5)
WBC Count: 3.2 10*3/uL — ABNORMAL LOW (ref 4.0–10.5)
nRBC: 0 % (ref 0.0–0.2)

## 2020-07-26 LAB — MAGNESIUM: Magnesium: 1.2 mg/dL — ABNORMAL LOW (ref 1.7–2.4)

## 2020-07-27 ENCOUNTER — Encounter: Payer: Self-pay | Admitting: Hematology and Oncology

## 2020-07-27 ENCOUNTER — Ambulatory Visit
Admission: RE | Admit: 2020-07-27 | Discharge: 2020-07-27 | Disposition: A | Discharge status: Home / Self Care | Payer: Medicare PPO | Source: Ambulatory Visit | Attending: Radiation Oncology | Admitting: Radiation Oncology

## 2020-07-27 ENCOUNTER — Inpatient Hospital Stay (HOSPITAL_BASED_OUTPATIENT_CLINIC_OR_DEPARTMENT_OTHER): Payer: Medicare PPO | Admitting: Hematology and Oncology

## 2020-07-27 ENCOUNTER — Other Ambulatory Visit: Payer: Self-pay | Admitting: Hematology and Oncology

## 2020-07-27 ENCOUNTER — Other Ambulatory Visit: Payer: Self-pay

## 2020-07-27 DIAGNOSIS — Z9221 Personal history of antineoplastic chemotherapy: Secondary | ICD-10-CM | POA: Diagnosis not present

## 2020-07-27 DIAGNOSIS — Z79899 Other long term (current) drug therapy: Secondary | ICD-10-CM | POA: Diagnosis not present

## 2020-07-27 DIAGNOSIS — G893 Neoplasm related pain (acute) (chronic): Secondary | ICD-10-CM

## 2020-07-27 DIAGNOSIS — Z510 Encounter for antineoplastic radiation therapy: Secondary | ICD-10-CM | POA: Diagnosis not present

## 2020-07-27 DIAGNOSIS — R634 Abnormal weight loss: Secondary | ICD-10-CM

## 2020-07-27 DIAGNOSIS — D61818 Other pancytopenia: Secondary | ICD-10-CM | POA: Insufficient documentation

## 2020-07-27 DIAGNOSIS — C519 Malignant neoplasm of vulva, unspecified: Secondary | ICD-10-CM

## 2020-07-27 DIAGNOSIS — C518 Malignant neoplasm of overlapping sites of vulva: Secondary | ICD-10-CM | POA: Diagnosis not present

## 2020-07-27 DIAGNOSIS — D6481 Anemia due to antineoplastic chemotherapy: Secondary | ICD-10-CM | POA: Diagnosis not present

## 2020-07-27 DIAGNOSIS — D696 Thrombocytopenia, unspecified: Secondary | ICD-10-CM | POA: Diagnosis not present

## 2020-07-27 MED ORDER — HYDROCODONE-ACETAMINOPHEN 5-325 MG PO TABS: 1.0000 | ORAL_TABLET | Freq: Four times a day (QID) | ORAL | 0 refills | Status: DC | PRN

## 2020-07-27 MED FILL — HYDROCODON-APAP 5-325: 5-325 | 7 days supply | Qty: 30 | Fill #0

## 2020-07-27 NOTE — Progress Notes (Signed)
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 very poorly Her oral intake is poor The patient simply would not eat just because she has no appetite and does not feel like it She has completed chemotherapy portion of her treatment She will continue radiation therapy I will see her back next month for further follow-up  Weight loss, abnormal She has progressive weight loss again due to poor appetite We discussed the importance of increased oral intake as tolerated  Pancytopenia, acquired ([place]) She have severe pancytopenia due to treatment She is not symptomatic Observe closely for now I plan to recheck her blood count again next month  Cancer associated pain She has minimum pain I refilled her prescription pain medicine I will reassess pain control next month   No orders of the defined types were placed in this encounter.   All questions were answered. The patient knows to call the clinic with any problems, questions or concerns. The total time spent in the appointment was 20 minutes encounter with patients including review of chart and various tests results, discussions about plan of care and coordination of care plan   Heath Lark, MD 07/27/2020 3:00 PM  INTERVAL HISTORY: Please see below for problem oriented charting. She returns with her son for further follow-up She denies side effects from recent treatment No recent infection, fever or chills She has minimum bleeding Her pain is well controlled.  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 Freddi Che), multiple vulvar re-excision  Surgeon: Margaretmary Bayley, MD; Andi Devon MD  Findings: Bilateral sentinel LN identified, both hot and blue. See Dr. Bethel Born note for full details. Posterior vulva with evidence of partial wound breakdown without evidence of infection. Granulation tissue noted with exposed subcutaneous sutures. 2x2cm concerning nodular plaque at 7 o'clock, medial margin <1cm from anus. Rectal exam at conclusion normal.   Specimens: 1. Left vulvar margin 2. Right vulvar margin 3. Left sentinel inguinal LN 4. Right sentinel inguinal LN 5. Vulvar lesion at 7 oclock, stitch at 12 oclock    03/04/2016 Pathology Results   A: Lymph node, right inguinal sentinel #2, removal - 1 lymph node negative for metastatic carcinoma (0/1). - Pan cytokeratin confirmatory (negative).   B: Lymph node, right inguinal sentinel #1, removal  - 2 lymph nodes, negative for metastatic carcinoma (0/2). - Pan cytokeratin confirmatory (negative).   C: Lymph node, left inguinal sentinel #1, removal  - 1 lymph node negative for metastatic carcinoma (0/1). - Pan cytokeratin confirmatory (negative).   D: Vulva, left margin, biopsy  - Ulcerated squamous tissue and granulation tissue. - Acute inflammation and  suture granuloma. - No dysplasia or malignancy identified.   E: Vulva, right margin, biopsy  - Skin and subcutaneous tissue with fat necrosis, ulceration, fibrosis and granulation tissue. - No  dysplasia or malignancy identified.   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 - Margins for invasive carcinoma are negative (deep 4 mm, 3 o'clock half 5 mm and 9 o'clock half 7 mm) - Margins for HSIL:  3-o'clock half positive for VIN 2. - Other findings: Fat necrosis, fibrosis and chronic inflammation   04/11/2020 Pathology Results   1. Vulva, biopsy, top - AT LEAST SQUAMOUS CELL CARCINOMA IN SITU, SUSPICIOUS FOR INVASION. SEE NOTE 2. Vulva, biopsy, middle - SQUAMOUS CARCINOMA IN SITU. SEE NOTE 3. Vulva, biopsy, bottom - SQUAMOUS CARCINOMA IN SITU. SEE NOTE   05/03/2020 PET scan   Hypermetabolic bilateral vulvar skin thickening max SUV 19, consistent with patient's known malignancy.   Hypermetabolic bilateral inguinal lymph nodes.  Right inguinal node measuring 16 x 25 mm max SUV 6.9  Left inguinal lymph node measuring 12 x 15 mm max SUV 5.3   No other worrisome hypermetabolic activity identified.   Additional findings:  Lobulated bilateral renal cortex suggesting scarring.  Postop change with the left breast.   05/16/2020 Surgery   Surgeon(s) and Role: * Hart Rochester, MD - Primary  Specimens:  ID Type Source Tests Collected by Time  1 : RIGHT INGUINAL Tissue Lymph Node PATHOLOGY TISSUE REQUEST Hart Rochester, MD 05/16/2020 [phone]  2 : LEFT INGUINAL Tissue Lymph Node PATHOLOGY TISSUE REQUEST Hart Rochester, MD 05/16/2020 [id number]    Findings:  Bilateral inguinal nodes c/w metastatic disease. On exam under anesthesia, the patient was noted to have a large pedunculated mass emanating from the left labia. This was fairly mobile and did not appear to be deeply invasive. Further evaluation of this  pedunculated mass revealed that there was disease crossing the perineal body extending up to the right labia and into the vaginal introitus. This was along the area of prior surgical resection. This did not appear to extend to the urethral meatus but full visualization of this was somewhat difficult given the bulkiness of the tumor and the friability. It was not felt that this was resectable based on the extent of disease and size of the lesion.    05/16/2020 Pathology Results   1.  Right inguinal lymph node, resection:      -Two lymph nodes, negative for carcinoma (0/2). 2.  Left inguinal lymph node, resection:      -Three lymph nodes, negative for carcinoma (0/3).   06/06/2020 Cancer Staging   Staging form: Vulva, AJCC 8th Edition - Clinical stage from 06/06/2020: Stage Unknown (rcTX, cN0, cM0) - Signed by Heath Lark, MD on 06/06/2020   06/13/2020 Procedure   Technically successful right IJ power-injectable port catheter placement. Ready for routine use   06/20/2020 -  Chemotherapy   The patient had palonosetron (ALOXI) injection 0.25 mg, 0.25 mg, Intravenous,  Once, 5 of 5 cycles Administration: 0.25 mg (06/20/2020), 0.25 mg (06/27/2020), 0.25 mg (07/04/2020), 0.25 mg (07/11/2020), 0.25 mg (07/18/2020) CISplatin (PLATINOL) 63 mg in sodium chloride 0.9 % 250 mL chemo infusion, 40 mg/m2 = 63 mg, Intravenous,  Once, 5 of 5 cycles  Dose modification: 30 mg/m2 (75 % of original dose 40 mg/m2, Cycle 4, Reason: Dose Not Tolerated) Administration: 63 mg (06/20/2020), 63 mg (06/27/2020), 63 mg (07/04/2020), 48 mg (07/11/2020), 49 mg (07/18/2020) fosaprepitant (EMEND) 150 mg in sodium chloride 0.9 % 145 mL IVPB, 150 mg, Intravenous,  Once, 5 of 5 cycles Administration: 150 mg (06/20/2020), 150 mg (06/27/2020), 150 mg (07/04/2020), 150 mg (07/11/2020), 150 mg (07/18/2020)  for chemotherapy treatment.      REVIEW OF SYSTEMS:   Constitutional: Denies fevers, chills  Eyes: Denies blurriness of vision Ears,  nose, mouth, throat, and face: Denies mucositis or sore throat Respiratory: Denies cough, dyspnea or wheezes Cardiovascular: Denies palpitation, chest discomfort or lower extremity swelling Gastrointestinal:  Denies nausea, heartburn or change in bowel habits Skin: Denies abnormal skin rashes Lymphatics: Denies new lymphadenopathy or easy bruising Neurological:Denies numbness, tingling or new weaknesses Behavioral/Psych: Mood is stable, no new changes  All other systems were reviewed with the patient and are negative.  I have reviewed the past medical history, past surgical history, social history and family history with the patient and they are unchanged from previous note.  ALLERGIES:  is allergic to duloxetine.  MEDICATIONS:  Current Outpatient Medications  Medication Sig Dispense Refill  . Cyanocobalamin (VITAMIN B 12 PO) Take 1,000 mcg by mouth daily.    Marland Kitchen donepezil (ARICEPT) 10 MG tablet Take 10 mg by mouth at bedtime.    . ergocalciferol (VITAMIN D2) 50000 UNITS capsule Take 50,000 Units by mouth every Tuesday.     . flecainide (TAMBOCOR) 50 MG tablet Take 1 tablet (50 mg total) by mouth 2 (two) times daily. 180 tablet 3  . HYDROcodone-acetaminophen (NORCO/VICODIN) 5-325 MG tablet Take 1 tablet by mouth every 6 (six) hours as needed. 30 tablet 0  . lidocaine-prilocaine (EMLA) cream Apply to affected area once 30 g 3  . metoprolol tartrate (LOPRESSOR) 25 MG tablet Take 1 tablet (25 mg total) by mouth 2 (two) times daily. 180 tablet 2  . mirtazapine (REMERON) 15 MG tablet Take 1 tablet (15 mg total) by mouth at bedtime. 30 tablet 11  . Omega-3 Fatty Acids (FISH OIL) 1200 MG CAPS Take 2 capsules by mouth every morning.    . ondansetron (ZOFRAN) 8 MG tablet Take 1 tablet (8 mg total) by mouth every 8 (eight) hours as needed. Start on the third day after cisplatin chemotherapy. 30 tablet 1  . prochlorperazine (COMPAZINE) 10 MG tablet Take 1 tablet (10 mg total) by mouth every 6 (six)  hours as needed (Nausea or vomiting). 30 tablet 1  . sertraline (ZOLOFT) 100 MG tablet Take 1 tablet (100 mg total) by mouth daily. 90 tablet 3   No current facility-administered medications for this visit.    PHYSICAL EXAMINATION: ECOG PERFORMANCE STATUS: 1 - Symptomatic but completely ambulatory  Vitals:   07/27/20 1412  BP: 129/68  Pulse: (!) 104  Resp: 18  Temp: 98.1 F (36.7 C)  SpO2: 100%   Filed Weights   07/27/20 1412  Weight: 126 lb (57.2 kg)    GENERAL:alert, no distress and comfortable.  She looks thin and cachectic NEURO: alert & oriented x 3 with fluent speech, no focal motor/sensory deficits  LABORATORY DATA:  I have reviewed the data as listed    Component Value Date/Time   NA 142 07/26/2020 1359   NA 139 09/01/2018 1550   NA 138 11/22/2013 1447   K 3.8 07/26/2020 1359   K 4.2 11/22/2013 1447   CL 104 07/26/2020 1359  CL 104 11/03/2012 1419   CO2 28 07/26/2020 1359   CO2 28 11/22/2013 1447   GLUCOSE 82 07/26/2020 1359   GLUCOSE 319 (H) 11/22/2013 1447   GLUCOSE 119 (H) 11/03/2012 1419   BUN 15 07/26/2020 1359   BUN 13 09/01/2018 1550   BUN 11.5 11/22/2013 1447   CREATININE 1.00 07/26/2020 1359   CREATININE 0.8 11/22/2013 1447   CALCIUM 8.6 (L) 07/26/2020 1359   CALCIUM 10.0 11/22/2013 1447   PROT 8.3 (H) 06/13/2020 1345   PROT 6.9 11/22/2013 1447   ALBUMIN 3.8 06/13/2020 1345   ALBUMIN 3.5 11/22/2013 1447   AST 16 06/13/2020 1345   AST 11 11/22/2013 1447   ALT 9 06/13/2020 1345   ALT 13 11/22/2013 1447   ALKPHOS 66 06/13/2020 1345   ALKPHOS 94 11/22/2013 1447   BILITOT 0.6 06/13/2020 1345   BILITOT 0.20 11/22/2013 1447   GFRNONAA 58 (L) 07/26/2020 1359   GFRAA >60 06/21/2020 1423    No results found for: SPEP, UPEP  Lab Results  Component Value Date   WBC 3.2 (L) 07/26/2020   NEUTROABS 2.3 07/26/2020   HGB 9.0 (L) 07/26/2020   HCT 27.4 (L) 07/26/2020   MCV 84.8 07/26/2020   PLT 93 (L) 07/26/2020      Chemistry       Component Value Date/Time   NA 142 07/26/2020 1359   NA 139 09/01/2018 1550   NA 138 11/22/2013 1447   K 3.8 07/26/2020 1359   K 4.2 11/22/2013 1447   CL 104 07/26/2020 1359   CL 104 11/03/2012 1419   CO2 28 07/26/2020 1359   CO2 28 11/22/2013 1447   BUN 15 07/26/2020 1359   BUN 13 09/01/2018 1550   BUN 11.5 11/22/2013 1447   CREATININE 1.00 07/26/2020 1359   CREATININE 0.8 11/22/2013 1447      Component Value Date/Time   CALCIUM 8.6 (L) 07/26/2020 1359   CALCIUM 10.0 11/22/2013 1447   ALKPHOS 66 06/13/2020 1345   ALKPHOS 94 11/22/2013 1447   AST 16 06/13/2020 1345   AST 11 11/22/2013 1447   ALT 9 06/13/2020 1345   ALT 13 11/22/2013 1447   BILITOT 0.6 06/13/2020 1345   BILITOT 0.20 11/22/2013 1447

## 2020-07-27 NOTE — Assessment & Plan Note (Signed)
She continues to tolerate treatment very poorly Her oral intake is poor The patient simply would not eat just because she has no appetite and does not feel like it She has completed chemotherapy portion of her treatment She will continue radiation therapy I will see her back next month for further follow-up

## 2020-07-27 NOTE — Assessment & Plan Note (Signed)
She have severe pancytopenia due to treatment She is not symptomatic Observe closely for now I plan to recheck her blood count again next month

## 2020-07-27 NOTE — Assessment & Plan Note (Signed)
She has progressive weight loss again due to poor appetite We discussed the importance of increased oral intake as tolerated

## 2020-07-27 NOTE — Assessment & Plan Note (Signed)
She has minimum pain I refilled her prescription pain medicine I will reassess pain control next month

## 2020-07-28 ENCOUNTER — Ambulatory Visit
Admission: RE | Admit: 2020-07-28 | Discharge: 2020-07-28 | Disposition: A | Discharge status: Home / Self Care | Payer: Medicare PPO | Source: Ambulatory Visit | Attending: Radiation Oncology | Admitting: Radiation Oncology

## 2020-07-28 DIAGNOSIS — C518 Malignant neoplasm of overlapping sites of vulva: Secondary | ICD-10-CM | POA: Diagnosis not present

## 2020-07-28 DIAGNOSIS — C519 Malignant neoplasm of vulva, unspecified: Secondary | ICD-10-CM | POA: Diagnosis not present

## 2020-07-28 DIAGNOSIS — Z510 Encounter for antineoplastic radiation therapy: Secondary | ICD-10-CM | POA: Diagnosis not present

## 2020-07-31 ENCOUNTER — Ambulatory Visit
Admission: RE | Admit: 2020-07-31 | Discharge: 2020-07-31 | Disposition: A | Discharge status: Home / Self Care | Payer: Medicare PPO | Source: Ambulatory Visit | Attending: Radiation Oncology | Admitting: Radiation Oncology

## 2020-07-31 DIAGNOSIS — C519 Malignant neoplasm of vulva, unspecified: Secondary | ICD-10-CM | POA: Diagnosis not present

## 2020-07-31 DIAGNOSIS — C518 Malignant neoplasm of overlapping sites of vulva: Secondary | ICD-10-CM | POA: Diagnosis not present

## 2020-07-31 DIAGNOSIS — Z510 Encounter for antineoplastic radiation therapy: Secondary | ICD-10-CM | POA: Diagnosis not present

## 2020-08-01 ENCOUNTER — Ambulatory Visit: Payer: Medicare PPO

## 2020-08-01 ENCOUNTER — Other Ambulatory Visit: Payer: Self-pay | Admitting: Radiation Oncology

## 2020-08-01 ENCOUNTER — Ambulatory Visit
Admission: RE | Admit: 2020-08-01 | Discharge: 2020-08-01 | Disposition: A | Discharge status: Home / Self Care | Payer: Medicare PPO | Source: Ambulatory Visit | Attending: Radiation Oncology | Admitting: Radiation Oncology

## 2020-08-01 DIAGNOSIS — C518 Malignant neoplasm of overlapping sites of vulva: Secondary | ICD-10-CM | POA: Diagnosis not present

## 2020-08-01 DIAGNOSIS — C519 Malignant neoplasm of vulva, unspecified: Secondary | ICD-10-CM | POA: Diagnosis not present

## 2020-08-01 DIAGNOSIS — Z510 Encounter for antineoplastic radiation therapy: Secondary | ICD-10-CM | POA: Diagnosis not present

## 2020-08-01 MED ORDER — OXYCODONE-ACETAMINOPHEN 5-325 MG PO TABS
1.0000 | ORAL_TABLET | ORAL | 0 refills | Status: DC | PRN
Start: 2020-08-01 — End: 2020-08-01

## 2020-08-01 MED FILL — OXYCODONE-APAP 5-325MG: 5-325 | 5 days supply | Qty: 30 | Fill #0

## 2020-08-02 ENCOUNTER — Encounter: Payer: Self-pay | Admitting: Radiation Oncology

## 2020-08-02 ENCOUNTER — Ambulatory Visit
Admission: RE | Admit: 2020-08-02 | Discharge: 2020-08-02 | Disposition: A | Discharge status: Home / Self Care | Payer: Medicare PPO | Source: Ambulatory Visit | Attending: Radiation Oncology | Admitting: Radiation Oncology

## 2020-08-02 DIAGNOSIS — Z510 Encounter for antineoplastic radiation therapy: Secondary | ICD-10-CM | POA: Diagnosis not present

## 2020-08-02 DIAGNOSIS — C518 Malignant neoplasm of overlapping sites of vulva: Secondary | ICD-10-CM | POA: Diagnosis not present

## 2020-08-02 DIAGNOSIS — C519 Malignant neoplasm of vulva, unspecified: Secondary | ICD-10-CM | POA: Diagnosis not present

## 2020-08-04 DIAGNOSIS — C519 Malignant neoplasm of vulva, unspecified: Secondary | ICD-10-CM | POA: Diagnosis not present

## 2020-08-04 DIAGNOSIS — E43 Unspecified severe protein-calorie malnutrition: Secondary | ICD-10-CM | POA: Diagnosis not present

## 2020-08-08 ENCOUNTER — Telehealth: Payer: Self-pay | Admitting: Oncology

## 2020-08-08 ENCOUNTER — Telehealth: Payer: Self-pay | Admitting: Hematology and Oncology

## 2020-08-08 DIAGNOSIS — C519 Malignant neoplasm of vulva, unspecified: Secondary | ICD-10-CM

## 2020-08-08 NOTE — Telephone Encounter (Signed)
Scheduled appt per 11/16 sch msg - for genetics and nutrition - pt son is aware of apts.

## 2020-08-08 NOTE — Telephone Encounter (Signed)
[name] (son) called and asked for a referral for nutrition.  He said she is losing a lot of weight and is not eating very much.

## 2020-08-09 ENCOUNTER — Telehealth: Payer: Self-pay | Admitting: Oncology

## 2020-08-09 DIAGNOSIS — F0390 Unspecified dementia without behavioral disturbance: Secondary | ICD-10-CM | POA: Diagnosis not present

## 2020-08-09 DIAGNOSIS — E43 Unspecified severe protein-calorie malnutrition: Secondary | ICD-10-CM | POA: Diagnosis not present

## 2020-08-09 NOTE — Telephone Encounter (Signed)
I have addressed the weight loss issue in every visit in the past She has dementia and refused to eat if she does not feel hungry The son is aware of her problem There is nothing else I can do

## 2020-08-09 NOTE — Telephone Encounter (Signed)
Called [name] back and discussed message below.  Advised him to keep the 08/22/20 nutrition appointment.  He verbalized understanding and agreement.

## 2020-08-09 NOTE — Telephone Encounter (Addendum)
[name] called during Abbygayle's appointment with her PCP - Dr. Laverna Peace.  Per Dr. Lindell Noe, Rakayla has lost another 11 lbs since 07/27/20 - she was 126 lbs and is 115 lbs today.  She is not sure that she is taking Remeron and is having her pharmacist refill her pill box to make sure it is correct.  She would like to let Dr. Alvy Bimler know about the weight loss and see if she has any recommendations.  She can be reached at [phone].   Kennadee has been scheduled for a nutrition consult at the [facility] on 08/22/20.

## 2020-08-22 ENCOUNTER — Inpatient Hospital Stay: Payer: Medicare PPO

## 2020-08-22 ENCOUNTER — Other Ambulatory Visit: Payer: Self-pay

## 2020-08-22 NOTE — Progress Notes (Signed)
Nutrition Assessment   Reason for Assessment: Poor appetite, weight loss   ASSESSMENT:  74 year old female with vulvar cancer.  Patient has completed chemotherapy and radiation therapy.  Past medical history of memory loss, pacemaker.    Met with patient and son [name] in clinic. Patient reports no appetite, unable to define time frame.  Reports that her appetite has improved over the last week. Son reports that she started on remeron and thinks that medication is helping. Patient reports that she eats when she is hungry.  Unable to give RD an example of what patient eats in a day.  Patient says that she eats more "regular" food now than junk foods.  Patient defines regular foods as meats, vegetables.  Son reports she eats about 2:30-3:00 pm in the afternoon and yesterday ate salad.  Son says that patient eats few bites and then does not eat anymore. Son does not live with patient but sees her everyday.  Patient lives with husband. Son is Biomedical scientist but says that patient will not eat his food.  Patient has ensure/boost shakes that she drinks.  Can't tell RD how often she drinks them.  Says she about cried this am because she drank half of one yesterday and left it out on table and wasted it.    Patient denies nausea, trouble chewing or swallowing.  Reports some trouble moving bowels.  Son says she has medication to help.  Son fell asleep off and on during visit, snoring at times during visit.     Medications: reviewed   Labs: reviewed   Anthropometrics:   Height: 60 inches Weight: 118 lb today measured in RD office 132 lb on 10/28 BMI: 23  11% weight loss in the last month, significant   Estimated Energy Needs  Kcals: 1600-1900 Protein: 80-95 g Fluid: > 1.6 L   NUTRITION DIAGNOSIS: Inadequate oral intake related to cancer related treatment side effects, poor appetite, ??memory issues as evidenced by 11% weight loss in the last month.      INTERVENTION:  Encouraged patient and  son to make schedule to "nibble" during the day and post on refrigerator or visible location to remind patient to eat.   Discussed ways to increase calories and protein. Handout provided to patient and son.  Also provided high calorie, high protein snack ideas to patient.   Encouraged 350 calorie shakes. Samples of ensure complete given to patient along with coupons.   Contact information provided.   MONITORING, EVALUATION, GOAL: weight trends, intake   Next Visit: Thursday, Dec 23rd before MD appt  [name] B. [name], [name], [name] Registered Dietitian [phone] (mobile)

## 2020-09-04 ENCOUNTER — Ambulatory Visit
Admission: RE | Admit: 2020-09-04 | Discharge: 2020-09-04 | Disposition: A | Discharge status: Home / Self Care | Payer: Medicare PPO | Source: Ambulatory Visit | Attending: Radiation Oncology | Admitting: Radiation Oncology

## 2020-09-04 ENCOUNTER — Other Ambulatory Visit: Payer: Medicare PPO

## 2020-09-04 ENCOUNTER — Encounter: Payer: Medicare PPO | Admitting: Genetic Counselor

## 2020-09-04 NOTE — Progress Notes (Incomplete)
  Radiation Oncology         [phone] ________________________________  Name: [name] MRN: [id number]  Date: 09/04/2020  DOB: August 12, 1946  Follow-Up Visit Note  CC: Glenis Smoker, MD  Hart Rochester, MD  No diagnosis found.  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  ***                              ALLERGIES:  is allergic to duloxetine.  Meds: Current Outpatient Medications  Medication Sig Dispense Refill  . Cyanocobalamin (VITAMIN B 12 PO) Take 1,000 mcg by mouth daily.    Marland Kitchen donepezil (ARICEPT) 10 MG tablet Take 10 mg by mouth at bedtime.    . ergocalciferol (VITAMIN D2) 50000 UNITS capsule Take 50,000 Units by mouth every Tuesday.     . flecainide (TAMBOCOR) 50 MG tablet Take 1 tablet (50 mg total) by mouth 2 (two) times daily. 180 tablet 3  . lidocaine-prilocaine (EMLA) cream Apply to affected area once 30 g 3  . metoprolol tartrate (LOPRESSOR) 25 MG tablet Take 1 tablet (25 mg total) by mouth 2 (two) times daily. 180 tablet 2  . mirtazapine (REMERON) 15 MG tablet Take 1 tablet (15 mg total) by mouth at bedtime. 30 tablet 11  . Omega-3 Fatty Acids (FISH OIL) 1200 MG CAPS Take 2 capsules by mouth every morning.    . ondansetron (ZOFRAN) 8 MG tablet Take 1 tablet (8 mg total) by mouth every 8 (eight) hours as needed. Start on the third day after cisplatin chemotherapy. 30 tablet 1  . oxyCODONE-acetaminophen (PERCOCET/ROXICET) 5-325 MG tablet Take 1 tablet by mouth every 4 (four) hours as needed for severe pain. 30 tablet 0  . prochlorperazine (COMPAZINE) 10 MG tablet Take 1 tablet (10 mg total) by mouth every 6 (six) hours as needed (Nausea or vomiting). 30 tablet 1  . sertraline (ZOLOFT) 100 MG tablet Take 1 tablet (100 mg total) by mouth daily. 90 tablet 3   No current facility-administered medications for  this encounter.    Physical Findings: The patient is in no acute distress. Patient is alert and oriented.  vitals were not taken for this visit. .  No significant changes.  Lab Findings: Lab Results  Component Value Date   WBC 3.2 (L) 07/26/2020   HGB 9.0 (L) 07/26/2020   HCT 27.4 (L) 07/26/2020   MCV 84.8 07/26/2020   PLT 93 (L) 07/26/2020    Radiographic Findings: No results found.  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  ***  Plan:  ***  Total time spent in this encounter was *** minutes which included reviewing the patient's most recent *** physical examination, and documentation. ____________________________________   Blair Promise, PhD, MD  This document serves as a record of services personally performed by Gery Pray, MD. It was created on his behalf by Clerance Lav, a trained medical scribe. The creation of this record is based on the scribe's personal observations and the provider's statements to them. This document has been checked and approved by the attending provider.

## 2020-09-06 ENCOUNTER — Other Ambulatory Visit: Payer: Self-pay

## 2020-09-06 ENCOUNTER — Inpatient Hospital Stay: Payer: Medicare PPO | Attending: Hematology and Oncology | Admitting: Genetic Counselor

## 2020-09-06 ENCOUNTER — Inpatient Hospital Stay: Payer: Medicare PPO

## 2020-09-06 ENCOUNTER — Other Ambulatory Visit: Payer: Self-pay | Admitting: Genetic Counselor

## 2020-09-06 DIAGNOSIS — Z8042 Family history of malignant neoplasm of prostate: Secondary | ICD-10-CM

## 2020-09-06 DIAGNOSIS — Z853 Personal history of malignant neoplasm of breast: Secondary | ICD-10-CM

## 2020-09-07 ENCOUNTER — Encounter: Payer: Self-pay | Admitting: Genetic Counselor

## 2020-09-07 DIAGNOSIS — Z8042 Family history of malignant neoplasm of prostate: Secondary | ICD-10-CM | POA: Insufficient documentation

## 2020-09-07 NOTE — Progress Notes (Signed)
REFERRING PROVIDER: Glenis Smoker, MD [address] [place],  [place] [address]  PRIMARY PROVIDER:  Glenis Smoker, MD  PRIMARY REASON FOR VISIT:  1. NEOPLASM, MALIGNANT, BREAST, HX OF   2. Family history of prostate cancer      HISTORY OF PRESENT ILLNESS:   [name], a 74 y.o. female, was seen for a [facility] [place] cancer genetics consultation at the request of Dr. Lindell Noe due to a personal and family history of cancer.  [name] presents to clinic today to discuss the possibility of a hereditary predisposition to cancer, genetic testing, and to further clarify her future cancer risks, as well as potential cancer risks for family members.   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 The treatment plan included lumpectomy, chemotherapy and radiation.  The patient reports taking tamoxifen, despite her pathology indicating that her tumor was ER-. She also has a diagnoses of vulvar cancer, that her son reports is a recurrence.     CANCER HISTORY:  Oncology History  Vulvar cancer ([place])  02/01/2016 Surgery   Preop Diagnosis: Vulvar mass, 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 Freddi Che), multiple vulvar re-excision  Surgeon: Margaretmary Bayley, MD; Andi Devon MD  Findings: Bilateral sentinel LN identified, both hot and blue. See Dr. Bethel Born note for full details. Posterior vulva with evidence of partial wound breakdown without  evidence of infection. Granulation tissue noted with exposed subcutaneous sutures. 2x2cm concerning nodular plaque at 7 o'clock, medial margin <1cm from anus. Rectal exam at conclusion normal.   Specimens: 1. Left vulvar margin 2. Right vulvar margin 3. Left sentinel inguinal LN 4. Right sentinel inguinal LN 5. Vulvar lesion at 7 oclock, stitch at 12 oclock    03/04/2016 Pathology Results   A: Lymph node, right inguinal sentinel #2, removal - 1 lymph node negative for metastatic carcinoma (0/1). - Pan cytokeratin confirmatory (negative).   B: Lymph node, right inguinal sentinel #1, removal  - 2 lymph nodes, negative for metastatic carcinoma (0/2). - Pan cytokeratin confirmatory (negative).   C: Lymph node, left inguinal sentinel #1, removal  - 1 lymph node negative for metastatic carcinoma (0/1). - Pan cytokeratin confirmatory (negative).   D: Vulva, left margin, biopsy  - Ulcerated squamous tissue and granulation tissue. - Acute inflammation and suture granuloma. - No dysplasia or malignancy identified.   E: Vulva, right margin, biopsy  - Skin and subcutaneous tissue with fat necrosis, ulceration, fibrosis and granulation tissue. - No dysplasia or malignancy identified.   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 - Margins for invasive carcinoma are negative (deep 4 mm, 3 o'clock half 5 mm and 9 o'clock half 7 mm) - Margins for HSIL:  3-o'clock half positive for  VIN 2. - Other findings: Fat necrosis, fibrosis and chronic inflammation   04/11/2020 Pathology Results   1. Vulva, biopsy, top - AT LEAST SQUAMOUS CELL CARCINOMA IN SITU, SUSPICIOUS FOR INVASION. SEE NOTE 2. Vulva, biopsy, middle - SQUAMOUS  CARCINOMA IN SITU. SEE NOTE 3. Vulva, biopsy, bottom - SQUAMOUS CARCINOMA IN SITU. SEE NOTE   05/03/2020 PET scan   Hypermetabolic bilateral vulvar skin thickening max SUV 19, consistent with patient's known malignancy.   Hypermetabolic bilateral inguinal lymph nodes.  Right inguinal node measuring 16 x 25 mm max SUV 6.9  Left inguinal lymph node measuring 12 x 15 mm max SUV 5.3   No other worrisome hypermetabolic activity identified.   Additional findings:  Lobulated bilateral renal cortex suggesting scarring.  Postop change with the left breast.   05/16/2020 Surgery   Surgeon(s) and Role: * Hart Rochester, MD - Primary  Specimens:  ID Type Source Tests Collected by Time  1 : RIGHT INGUINAL Tissue Lymph Node PATHOLOGY TISSUE REQUEST Hart Rochester, MD 05/16/2020 [phone]  2 : LEFT INGUINAL Tissue Lymph Node PATHOLOGY TISSUE REQUEST Hart Rochester, MD 05/16/2020 [phone]    Findings:  Bilateral inguinal nodes c/w metastatic disease. On exam under anesthesia, the patient was noted to have a large pedunculated mass emanating from the left labia. This was fairly mobile and did not appear to be deeply invasive. Further evaluation of this pedunculated mass revealed that there was disease crossing the perineal body extending up to the right labia and into the vaginal introitus. This was along the area of prior surgical resection. This did not appear to extend to the urethral meatus but full visualization of this was somewhat difficult given the bulkiness of the tumor and the friability. It was not felt that this was resectable based on the extent of disease and size of the lesion.    05/16/2020 Pathology Results   1.  Right inguinal lymph node, resection:      -Two lymph nodes, negative for carcinoma (0/2). 2.  Left inguinal lymph node, resection:      -Three lymph nodes, negative for carcinoma (0/3).   06/06/2020 Cancer Staging   Staging form: Vulva, AJCC 8th Edition - Clinical stage  from 06/06/2020: Stage Unknown (rcTX, cN0, cM0) - Signed by Heath Lark, MD on 06/06/2020   06/13/2020 Procedure   Technically successful right IJ power-injectable port catheter placement. Ready for routine use   06/20/2020 -  Chemotherapy   The patient had palonosetron (ALOXI) injection 0.25 mg, 0.25 mg, Intravenous,  Once, 5 of 5 cycles Administration: 0.25 mg (06/20/2020), 0.25 mg (06/27/2020), 0.25 mg (07/04/2020), 0.25 mg (07/11/2020), 0.25 mg (07/18/2020) CISplatin (PLATINOL) 63 mg in sodium chloride 0.9 % 250 mL chemo infusion, 40 mg/m2 = 63 mg, Intravenous,  Once, 5 of 5 cycles Dose modification: 30 mg/m2 (75 % of original dose 40 mg/m2, Cycle 4, Reason: Dose Not Tolerated) Administration: 63 mg (06/20/2020), 63 mg (06/27/2020), 63 mg (07/04/2020), 48 mg (07/11/2020), 49 mg (07/18/2020) fosaprepitant (EMEND) 150 mg in sodium chloride 0.9 % 145 mL IVPB, 150 mg, Intravenous,  Once, 5 of 5 cycles Administration: 150 mg (06/20/2020), 150 mg (06/27/2020), 150 mg (07/04/2020), 150 mg (07/11/2020), 150 mg (07/18/2020)  for chemotherapy treatment.       RISK FACTORS:  Menarche was at age 40.  First live birth at age 54.  OCP use for approximately 0 years.  Ovaries intact: no.  Hysterectomy: yes.  Menopausal status: postmenopausal.  HRT use: 0 years. Colonoscopy:  yes; normal. Mammogram within the last year: yes. Number of breast biopsies: 2. Up to date with pelvic exams: yes. Any excessive radiation exposure in the past: no  Past Medical History:  Diagnosis Date  . Bradycardia 06/2018  . Breast cancer ([place])   . Cancer [facility]) left    breast cancer 2005 surgery, chemo and radiation   . Complete heart block ([place])   . Diabetes mellitus   . Family history of prostate cancer   . Headache   . Hypercholesteremia   . Macular degeneration   . Osteoarthritis   . 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 Vitamin D deficiency   . Vulvar cancer ([place]) 2017 new  dx    Past Surgical History:  Procedure Laterality Date  . ABDOMINAL HYSTERECTOMY     complete  . BREAST SURGERY Left 2006   left breast lumpectomy , chemo and radiation done  . COLONOSCOPY WITH PROPOFOL N/A 07/24/2015   Procedure: COLONOSCOPY WITH PROPOFOL;  Surgeon: Garlan Fair, MD;  Location: [facility];  Service: Endoscopy;  Laterality: N/A;  . EYE SURGERY     removal of growth 35 years ago  . HEMORROIDECTOMY     38 years ago, no present symtoms  . IR IMAGING GUIDED PORT INSERTION  06/13/2020  . KNEE SURGERY Bilateral   . PACEMAKER IMPLANT N/A 07/16/2018   Procedure: PACEMAKER IMPLANT;  Surgeon: Evans Lance, MD;  Location: [facility];  Service: Cardiovascular;  Laterality: N/A;  . RADICAL VULVECTOMY N/A 02/01/2016   Procedure: RADICAL VULVECTOMY;  Surgeon: [name], MD;  Location: [facility];  Service: Gynecology;  Laterality: N/A;    Social History   Socioeconomic History  . Marital status: Married    Spouse name: Not on file  . Number of children: 2  . Years of education: Not on file  . Highest education level: Master's degree (e.g., MA, MS, MEng, MEd, MSW, MBA)  Occupational History  . Not on file  Tobacco Use  . Smoking status: Never Smoker  . Smokeless tobacco: Never Used  Vaping Use  . Vaping Use: Never used  Substance and Sexual Activity  . Alcohol use: Never  . Drug use: Never  . Sexual activity: Yes  Other Topics Concern  . Not on file  Social History Narrative   Lives at home with husband   Right handed   Caffeine: never   Social Determinants of Health   Financial Resource Strain: Low Risk   . Difficulty of Paying Living Expenses: Not hard at all  Food Insecurity: No Food Insecurity  . Worried About Charity fundraiser in the Last Year: Never true  . Ran Out of Food in the Last Year: Never true  Transportation Needs: No Transportation Needs  . Lack of Transportation (Medical): No  . Lack of Transportation (Non-Medical): No   Physical Activity: Not on file  Stress: Not on file  Social Connections: Moderately Isolated  . Frequency of Communication with Friends and Family: More than three times a week  . Frequency of Social Gatherings with Friends and Family: More than three times a week  . Attends Religious Services: Never  . Active Member of Clubs or Organizations: No  . Attends Archivist Meetings: Never  . Marital Status: Married     FAMILY HISTORY:  We obtained a detailed, 4-generation family history.  Significant diagnoses are listed below: Family History  Problem Relation Age of Onset  .  Dementia Mother   . Other Father        possible Dementia  . Prostate cancer Father   . HIV Brother   . Kidney failure Brother     The patient has two children who are cancer free.  She has three brothers, two are deceased from non-cancer related issues.  Both parents are deceased.  The patient's mother died from dementia related issues.  She had a brother and sister who were cancer free.  The maternal grandparents died from old age.  The patient's father had prostate cancer.  He had 25 siblings, some were half and some were step siblings, the patient did not know which were which.  None were reported to have cancer.  [name] is unaware of previous family history of genetic testing for hereditary cancer risks. Patient's maternal ancestors are of African American descent, and paternal ancestors are of African American descent. There is no reported Ashkenazi Jewish ancestry. There is no known consanguinity.  GENETIC COUNSELING ASSESSMENT: [name] is a 74 y.o. female with a personal and family history of cancer which is somewhat suggestive of a hereditary breast cancer syndrome and predisposition to cancer given her triple negative breast cancer and the family history of prostate cancer. We, therefore, discussed and recommended the following at today's visit.   DISCUSSION: We discussed that 5 - 10% of  breast cancer is hereditary, with most cases associated with BRCA mutations.  There are other genes that can be associated with hereditary breast cancer syndromes.  These include ATM, CHEK2 and PALB2.  We discussed that testing is beneficial for several reasons including knowing how to follow individuals after completing their treatment, identifying whether potential treatment options such as PARP inhibitors would be beneficial, and understand if other family members could be at risk for cancer and allow them to undergo genetic testing.   We reviewed the characteristics, features and inheritance patterns of hereditary cancer syndromes. We also discussed genetic testing, including the appropriate family members to test, the process of testing, insurance coverage and turn-around-time for results. We discussed the implications of a negative, positive, carrier and/or variant of uncertain significant result. We recommended [name] pursue genetic testing for the common hereditary cancer gene panel. The Common Hereditary Gene Panel offered by [facility] includes sequencing and/or deletion duplication testing of the following 48 genes: APC, ATM, AXIN2, BARD1, BMPR1A, BRCA1, BRCA2, BRIP1, CDH1, CDK4, CDKN2A (p14ARF), CDKN2A (p16INK4a), CHEK2, CTNNA1, DICER1, EPCAM (Deletion/duplication testing only), GREM1 (promoter region deletion/duplication testing only), KIT, MEN1, MLH1, MSH2, MSH3, MSH6, MUTYH, NBN, NF1, NHTL1, PALB2, PDGFRA, PMS2, POLD1, POLE, PTEN, RAD50, RAD51C, RAD51D, RNF43, SDHB, SDHC, SDHD, SMAD4, SMARCA4. STK11, TP53, TSC1, TSC2, and VHL.  The following genes were evaluated for sequence changes only: SDHA and HOXB13 c.251G>A variant only.   Based on [name]'s personal and family history of cancer, she meets medical criteria for genetic testing. Despite that she meets criteria, she may still have an out of pocket cost. We discussed that if her out of pocket cost for testing is over $100, the laboratory  will call and confirm whether she wants to proceed with testing.  If the out of pocket cost of testing is less than $100 she will be billed by the genetic testing laboratory.   PLAN: After considering the risks, benefits, and limitations, [name] provided informed consent to pursue genetic testing.  She will have blood drawn on September 13, 2020 and the blood sample will be sent to [facility] for  analysis of the common hereditary cancer panel. Results should be available within approximately 2-3 weeks' time, at which point they will be disclosed by telephone to [name], as will any additional recommendations warranted by these results. [name] will receive a summary of her genetic counseling visit and a copy of her results once available. This information will also be available in Epic.   Lastly, we encouraged [name] to remain in contact with cancer genetics annually so that we can continuously update the family history and inform her of any changes in cancer genetics and testing that may be of benefit for this family.   [name] questions were answered to her satisfaction today. Our contact information was provided should additional questions or concerns arise. Thank you for the referral and allowing [place] to share in the care of your patient.   [name], [name], [name] Licensed, Insurance risk surveyor [email or web address] phone: [phone]  The patient was seen for a total of 45 minutes in face-to-face genetic counseling.  This patient was discussed with Drs. Magrinat, Lindi Adie and/or Burr Medico who agrees with the above.    _______________________________________________________________________ For Office Staff:  Number of people involved in session: 2 Was an Intern/ student involved with case: no

## 2020-09-14 ENCOUNTER — Encounter: Payer: Medicare PPO | Admitting: Nutrition

## 2020-09-14 ENCOUNTER — Inpatient Hospital Stay: Payer: Medicare PPO

## 2020-09-14 ENCOUNTER — Encounter: Payer: Self-pay | Admitting: Hematology and Oncology

## 2020-09-14 ENCOUNTER — Inpatient Hospital Stay: Payer: Medicare PPO | Admitting: Hematology and Oncology

## 2020-09-18 NOTE — Progress Notes (Signed)
  Patient Name: [name] MRN: [id number] DOB: 07-Dec-1945 Referring Physician: Genia Del (Profile Not Attached) Date of Service: 08/02/2020 [facility], [place]                                                        End Of Treatment Note  Diagnoses: C51.9-Malignant neoplasm of vulva, unspecified  Cancer Staging: Recurrent squamous cell carcinoma of the vulva  Intent: Curative  Radiation Treatment Dates: 06/15/2020 through 08/02/2020 Site Technique Total Dose (Gy) Dose per Fx (Gy) Completed Fx Beam Energies  Vulva: Pelvis IMRT 45/45 1.8 25/25 6X  Vulva: Pelvis_Bst IMRT 18/18 2 9/9 6X   Narrative: The patient tolerated radiation therapy relatively well. She underwent IMRT from 06/15/2020 - 07/20/2020. During that time, she did report some pain along the right groin area, generalized body pain, lower pelvic pain, intermittent vaginal spotting that varied from the size of a dime to the size of a quarter, and decreased appetite. She denied skin irritation and bowel/bladder symptoms. On 10/5 examination, the surgical scars along both inguinal areas had healed well and there was some appropriate swelling but no significant seroma detected. The vulvar mass seemed to had decreased in size. There was no active bleeding. As treatment continued, vulvar mass continued to significantly decrease in size.  During boost treatments, she reported some intermittent fatigue, decreased appetite, difficulty urinating on some days, and pain along the right side of her vaginal area back towards her rectum. As treatment continued, she began to report rawness of her bottom and inner thighs. She was given a prescription for Vicodin given her pain level. She denied nausea, diarrhea, constipation, and bowel or bladder incontinence.  Based on  planning CT and clinical exam she had a significant shrinkage of her vulvar mass.  She did receive radiosensitizing chemotherapy.  Total dose to the vulvar mass was 63 Gy  Plan: The patient will follow-up with radiation oncology in one month.  ________________________________________________   Blair Promise, PhD, MD  This document serves as a record of services personally performed by Gery Pray, MD. It was created on his behalf by Clerance Lav, a trained medical scribe. The creation of this record is based on the scribe's personal observations and the provider's statements to them. This document has been checked and approved by the attending provider.

## 2020-09-18 NOTE — Progress Notes (Incomplete)
                                                                                                                                                            Patient Name: [name] MRN: [id number] DOB: [birth year] Referring Physician: Madaline Guthrie (Profile Not Attached) Date of Service: 07/20/2020 [facility], [place]                                                        End Of Treatment Note  Diagnoses: C51.9-Malignant neoplasm of vulva, unspecified  Cancer Staging: Recurrent squamous cell carcinoma of the vulva  Intent: Curative  Radiation Treatment Dates: 06/15/2020 through 07/20/2020 Site Technique Total Dose (Gy) Dose per Fx (Gy) Completed Fx Beam Energies  Vulva: Pelvis IMRT 45/45 1.8 25/25 6X    Narrative: The patient tolerated radiation therapy relatively well. She did report some pain along the right groin area, generalized body pain, lower pelvic pain, intermittent vaginal spotting that varied from the size of a dime to the size of a quarter, and decreased appetite. She denied skin irritation and bowel/bladder symptoms. On 10/5 examination, the surgical scars along both inguinal areas had healed well and there was some appropriate swelling but no significant seroma detected. The vulvar mass seemed to had decreased in size. There was no active bleeding. As treatment continued, vulvar mass continued to significantly decrease in size.  Plan: The patient will begin boost treatments.  ________________________________________________   Billie Lade, PhD, MD  This document serves as a record of services personally performed by Antony Blackbird, MD. It was created on his behalf by Nikki Dom, a trained medical scribe. The creation of this record is based on the scribe's personal observations and the provider's statements to them. This document has been checked and approved by the attending provider.

## 2020-09-19 ENCOUNTER — Telehealth: Payer: Self-pay | Admitting: Hematology and Oncology

## 2020-09-19 ENCOUNTER — Inpatient Hospital Stay: Payer: Medicare PPO

## 2020-09-19 NOTE — Telephone Encounter (Signed)
Called pt son per 12/28 sch msg to reschedule appt. No answer . Left message for patient to call back

## 2020-09-19 NOTE — Progress Notes (Signed)
Nutrition  Patient was a no show for nutrition follow-up today in clinic.  Message sent to scheduling to offer another appointment.   Dia Jefferys B. Freida Busman, RD, LDN Registered Dietitian [phone] (mobile)

## 2020-09-21 DIAGNOSIS — C519 Malignant neoplasm of vulva, unspecified: Secondary | ICD-10-CM | POA: Diagnosis not present

## 2020-09-29 ENCOUNTER — Telehealth: Payer: Self-pay | Admitting: Genetic Counselor

## 2020-09-29 NOTE — Telephone Encounter (Signed)
Called patient about trying to r/s her blood draw for genetics.  I reached her son.  He said that they missed the appointment in Dec with Dr. Alvy Bimler and the blood draw for genetics and they need to r/s.  I gave him the scheduling number and told him I would also send information to scheduling for them to call as well.

## 2020-10-02 ENCOUNTER — Encounter: Payer: Self-pay | Admitting: Genetic Counselor

## 2020-10-02 ENCOUNTER — Telehealth: Payer: Self-pay | Admitting: Oncology

## 2020-10-02 NOTE — Progress Notes (Signed)
Thank you, I'll cancel this. Santiago Glad P ===View-only below this line=== ----- Message ----- From: Jacqulyn Liner, RN Sent: 10/02/2020  11:50 AM EST To: Clarene Essex, Counselor  Tobey Bride,  She has bee rescheduled to 1/18.  When I talked to her son though he said she is not interested in having the testing done.  I told him I would let you know.    Thank you! Santiago Glad ----- Message ----- From: Clarene Essex, Counselor Sent: 09/29/2020   3:19 PM EST To: Jacqulyn Liner, RN  Santiago Glad, can you help me with r/s her appointment?  She had an appointment for a blood draw on 12/23 where we were going to draw blood for genetics.  I just spoke with her son who said that they missed Dr. Calton Dach appointment that day too and needed to r/s both.  Can you help with r/s this?  Or who should I send this to?  Allysa Governale

## 2020-10-02 NOTE — Telephone Encounter (Signed)
Called [name] (son) and scheduled lab/flush and follow up with Dr. Alvy Bimler on 10/10/20.  He also said she has decided that she does not want genetic testing done.  Advised him that I will notify the genetic counselor of her decision.

## 2020-10-02 NOTE — Progress Notes (Signed)
Thank you, I'll cancel this. - Ahmani Daoud P ===View-only below this line=== ----- Message ----- From: Hess, Erynn Vaca R, RN Sent: 10/02/2020  11:50 AM EST To: Lillan Mccreadie L Merrik Puebla, Counselor  Hi Leif Loflin,  She has bee rescheduled to 1/18.  When I talked to her son though he said she is not interested in having the testing done.  I told him I would let you know.    Thank you! Nicholaus Steinke ----- Message ----- From: Jailani Hogans L, Counselor Sent: 09/29/2020   3:19 PM EST To: Keiden Deskin R Hess, RN  Ashtan Laton, can you help me with r/s her appointment?  She had an appointment for a blood draw on 12/23 where we were going to draw blood for genetics.  I just spoke with her son who said that they missed Dr. Gorsuch's appointment that day too and needed to r/s both.  Can you help with r/s this?  Or who should I send this to?  Bessye Stith   

## 2020-10-09 ENCOUNTER — Telehealth: Payer: Self-pay | Admitting: Hematology and Oncology

## 2020-10-09 NOTE — Telephone Encounter (Signed)
Rescheduled from 1/18. Called and spoke with pt son, confirmed 1/25 appts

## 2020-10-10 ENCOUNTER — Telehealth: Payer: Self-pay | Admitting: Radiation Oncology

## 2020-10-10 ENCOUNTER — Telehealth: Payer: Self-pay | Admitting: Oncology

## 2020-10-10 ENCOUNTER — Ambulatory Visit: Payer: Medicare PPO | Admitting: Hematology and Oncology

## 2020-10-10 ENCOUNTER — Other Ambulatory Visit: Payer: Medicare PPO

## 2020-10-10 NOTE — Telephone Encounter (Signed)
[name]'s son called and asked if she could move her appointments on 10/17/20 to this week.  He would like to attend the appointments with her and can't next week due to work.

## 2020-10-10 NOTE — Telephone Encounter (Signed)
Sent records to Dr. Polly Cobia 10/10/20 per request on 10/05/20

## 2020-10-11 ENCOUNTER — Encounter: Payer: Self-pay | Admitting: Hematology and Oncology

## 2020-10-11 ENCOUNTER — Inpatient Hospital Stay: Payer: Medicare PPO

## 2020-10-11 ENCOUNTER — Other Ambulatory Visit: Payer: Self-pay | Admitting: Hematology and Oncology

## 2020-10-11 ENCOUNTER — Inpatient Hospital Stay: Payer: Medicare PPO | Attending: Hematology and Oncology | Admitting: Hematology and Oncology

## 2020-10-11 ENCOUNTER — Other Ambulatory Visit: Payer: Self-pay

## 2020-10-11 DIAGNOSIS — Z79899 Other long term (current) drug therapy: Secondary | ICD-10-CM | POA: Insufficient documentation

## 2020-10-11 DIAGNOSIS — R413 Other amnesia: Secondary | ICD-10-CM

## 2020-10-11 DIAGNOSIS — C519 Malignant neoplasm of vulva, unspecified: Secondary | ICD-10-CM | POA: Diagnosis not present

## 2020-10-11 DIAGNOSIS — D539 Nutritional anemia, unspecified: Secondary | ICD-10-CM

## 2020-10-11 DIAGNOSIS — R634 Abnormal weight loss: Secondary | ICD-10-CM | POA: Diagnosis not present

## 2020-10-11 DIAGNOSIS — Z7189 Other specified counseling: Secondary | ICD-10-CM

## 2020-10-11 DIAGNOSIS — D61818 Other pancytopenia: Secondary | ICD-10-CM

## 2020-10-11 DIAGNOSIS — K5909 Other constipation: Secondary | ICD-10-CM | POA: Diagnosis not present

## 2020-10-11 LAB — BASIC METABOLIC PANEL - CANCER CENTER ONLY
Anion gap: 10 (ref 5–15)
BUN: 25 mg/dL — ABNORMAL HIGH (ref 8–23)
CO2: 23 mmol/L (ref 22–32)
Calcium: 9.2 mg/dL (ref 8.9–10.3)
Chloride: 108 mmol/L (ref 98–111)
Creatinine: 1 mg/dL (ref 0.44–1.00)
GFR, Estimated: 58 mL/min — ABNORMAL LOW (ref >60)
Glucose, Bld: 87 mg/dL (ref 70–99)
Potassium: 3.6 mmol/L (ref 3.5–5.1)
Sodium: 141 mmol/L (ref 135–145)

## 2020-10-11 LAB — CBC WITH DIFFERENTIAL (CANCER CENTER ONLY)
Abs Immature Granulocytes: 0.01 10*3/uL (ref 0.00–0.07)
Basophils Absolute: 0 10*3/uL (ref 0.0–0.1)
Basophils Relative: 0 %
Eosinophils Absolute: 0.1 10*3/uL (ref 0.0–0.5)
Eosinophils Relative: 1 %
HCT: 26.5 % — ABNORMAL LOW (ref 36.0–46.0)
Hemoglobin: 8.5 g/dL — ABNORMAL LOW (ref 12.0–15.0)
Immature Granulocytes: 0 %
Lymphocytes Relative: 29 %
Lymphs Abs: 1.3 10*3/uL (ref 0.7–4.0)
MCH: 30.7 pg (ref 26.0–34.0)
MCHC: 32.1 g/dL (ref 30.0–36.0)
MCV: 95.7 fL (ref 80.0–100.0)
Monocytes Absolute: 0.3 10*3/uL (ref 0.1–1.0)
Monocytes Relative: 7 %
Neutro Abs: 2.7 10*3/uL (ref 1.7–7.7)
Neutrophils Relative %: 63 %
Platelet Count: 136 10*3/uL — ABNORMAL LOW (ref 150–400)
RBC: 2.77 MIL/uL — ABNORMAL LOW (ref 3.87–5.11)
RDW: 13.5 % (ref 11.5–15.5)
WBC Count: 4.4 10*3/uL (ref 4.0–10.5)
nRBC: 0 % (ref 0.0–0.2)

## 2020-10-11 LAB — MAGNESIUM: Magnesium: 1.9 mg/dL (ref 1.7–2.4)

## 2020-10-11 NOTE — Assessment & Plan Note (Signed)
She continues to have progressive weight loss She does not feel hungry Previously, I have extensive discussion with the patient and her family the importance of eating regularly for weight maintenance Unfortunately, her family is not able to be with her and to monitor her oral intake Observe for now

## 2020-10-11 NOTE — Assessment & Plan Note (Signed)
I have reviewed documentation by GYN surgeon from last month The patient felt better since last time I saw her I would defer follow-up to GYN surgeon to determine the next best course of action I will see her back in 2 months for further follow-up and supportive care For now, I told the patient and her daughter there is no indication to pursue chemotherapy

## 2020-10-11 NOTE — Telephone Encounter (Signed)
Called [name] and rescheduled appointment to today to see Dr. Alvy Bimler at 12:00 followed by lab and flush.  He has an appointment at 10:45 today so he was not able to bring [name] for labs before Dr. Calton Dach appointment.

## 2020-10-11 NOTE — Assessment & Plan Note (Signed)
The patient has significant memory issues She does not recall prior conversations Her son who has been present throughout previous visits are not here Her daughter whom I have never met before is present but does not appear to be taking keen interest in the patient's wellbeing The patient does not know the purpose of her port and does not recognize pacemaker placed on the left side of her chest I reinforced the importance of her son to be with her in future visit

## 2020-10-11 NOTE — Assessment & Plan Note (Signed)
The cause of her pancytopenia is multifactorial, likely due to poor oral intake and recent treatment I will order additional work-up in her next visit Currently, she is not symptomatic

## 2020-10-11 NOTE — Assessment & Plan Note (Signed)
She continues to have severe constipation despite multiple discussions about the importance of regular laxative therapy I reinforced the discussion again in the presence of her daughter

## 2020-10-11 NOTE — Progress Notes (Signed)
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[facility]) The cause of her pancytopenia is multifactorial, likely due to poor oral intake and recent treatment I will order additional work-up in her next visit Currently, she is not symptomatic  Weight loss, abnormal She continues to have progressive weight loss She does not feel hungry Previously, I have extensive discussion with the patient and her family the importance of eating regularly for weight maintenance Unfortunately, her family is not able to be with her and to monitor her oral intake Observe for now  Other constipation She continues to have severe constipation despite multiple discussions about the importance of regular laxative therapy I reinforced the discussion again in the presence of her daughter  Memory loss The patient has significant memory issues She does not recall prior conversations Her son who has been present throughout previous visits are not here Her daughter whom I have never met before is present but does not appear to be taking keen interest in the patient's wellbeing The patient does not know the purpose of her port and does not recognize pacemaker placed on the left side of her chest I reinforced the importance of her son to be with her in future visit   Orders Placed This Encounter  Procedures  . Vitamin B12    Standing Status:   Standing    Number of Occurrences:   1    Standing Expiration Date:    10/11/2021  . Iron and TIBC    Standing Status:   Standing    Number of Occurrences:   1    Standing Expiration Date:   10/11/2021  . Ferritin    Standing Status:   Standing    Number of Occurrences:   1    Standing Expiration Date:   10/11/2021    All questions were answered. The patient knows to call the clinic with any problems, questions or concerns. The total time spent in the appointment was 30 minutes encounter with patients including review of chart and various tests results, discussions about plan of care and coordination of care plan   Artis Delay, MD 10/11/2020 3:05 PM  INTERVAL HISTORY: Please see below for problem oriented charting. The patient was lost to follow-up Despite sending letters to the patient, she did not show up until today Her son who has always been present throughout previous visit is not here today Her daughter whom I have never met before accompanied the patient She is noted to have lost a lot of weight She told me she does not feel hungry She is also profoundly constipated, recalling bowel movement perhaps once or twice a week She felt that her pelvic pain is less and she thought she has less bleeding since the last time I saw her She does not understand why she has a port placed on her right chest and is inquiring about the pacemaker that was implanted on the left chest wall   SUMMARY OF ONCOLOGIC HISTORY: Oncology  History  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 Freddi Che), multiple vulvar re-excision  Surgeon: Margaretmary Bayley, MD; Andi Devon MD  Findings: Bilateral sentinel LN identified, both hot and blue. See Dr. Bethel Born note for full details. Posterior vulva with evidence of partial wound breakdown without evidence of infection. Granulation tissue noted with exposed subcutaneous sutures. 2x2cm concerning nodular plaque at 7 o'clock, medial margin <1cm from anus. Rectal exam at conclusion normal.   Specimens: 1. Left vulvar margin 2. Right vulvar margin 3. Left sentinel inguinal LN 4. Right sentinel inguinal LN 5. Vulvar lesion at 7 oclock, stitch at 12 oclock    03/04/2016 Pathology Results   A: Lymph node, right inguinal sentinel #2, removal - 1 lymph node negative for metastatic carcinoma (0/1). - Pan cytokeratin confirmatory (negative).   B: Lymph node, right inguinal sentinel #1, removal  - 2 lymph nodes, negative for metastatic carcinoma (0/2). - Pan cytokeratin confirmatory (negative).   C: Lymph node, left inguinal sentinel #1, removal  - 1 lymph node negative for metastatic carcinoma (0/1). - Pan cytokeratin confirmatory (negative).   D: Vulva, left margin, biopsy  - Ulcerated squamous tissue and granulation tissue. - Acute inflammation and suture granuloma. - No dysplasia or malignancy identified.   E: Vulva, right margin, biopsy  - Skin and subcutaneous tissue with fat necrosis, ulceration, fibrosis and granulation tissue. - No dysplasia or malignancy identified.   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 - Margins for invasive carcinoma are negative (deep 4 mm, 3  o'clock half 5 mm and 9 o'clock half 7 mm) - Margins for HSIL:  3-o'clock half positive for VIN 2. - Other findings: Fat necrosis, fibrosis and chronic inflammation   04/11/2020 Pathology Results   1. Vulva, biopsy, top - AT LEAST SQUAMOUS CELL CARCINOMA IN SITU, SUSPICIOUS FOR INVASION. SEE NOTE 2. Vulva, biopsy, middle - SQUAMOUS CARCINOMA IN SITU. SEE NOTE 3. Vulva, biopsy, bottom - SQUAMOUS CARCINOMA IN SITU. SEE NOTE   05/03/2020 PET scan   Hypermetabolic bilateral vulvar skin thickening max SUV 19, consistent with patient's known malignancy.   Hypermetabolic bilateral inguinal lymph nodes.  Right inguinal node measuring 16 x 25 mm max SUV 6.9  Left inguinal lymph node measuring 12 x 15 mm max SUV 5.3   No other worrisome hypermetabolic activity identified.   Additional findings:  Lobulated bilateral renal cortex suggesting scarring.  Postop change with the left breast.   05/16/2020 Surgery   Surgeon(s) and Role: * Hart Rochester, MD - Primary  Specimens:  ID Type Source Tests Collected by Time  1 : RIGHT INGUINAL Tissue Lymph Node PATHOLOGY TISSUE REQUEST Hart Rochester, MD 05/16/[id number]  2 : LEFT  INGUINAL Tissue Lymph Node PATHOLOGY TISSUE REQUEST Hart Rochester, MD 05/16/2020 [id number]    Findings:  Bilateral inguinal nodes c/w metastatic disease. On exam under anesthesia, the patient was noted to have a large pedunculated mass emanating from the left labia. This was fairly mobile and did not appear to be deeply invasive. Further evaluation of this pedunculated mass revealed that there was disease crossing the perineal body extending up to the right labia and into the vaginal introitus. This was along the area of prior surgical resection. This did not appear to extend to the urethral meatus but full visualization of this was somewhat difficult given the bulkiness of the tumor and the friability. It was not felt that this was resectable based on the extent of disease  and size of the lesion.    05/16/2020 Pathology Results   1.  Right inguinal lymph node, resection:      -Two lymph nodes, negative for carcinoma (0/2). 2.  Left inguinal lymph node, resection:      -Three lymph nodes, negative for carcinoma (0/3).   06/06/2020 Cancer Staging   Staging form: Vulva, AJCC 8th Edition - Clinical stage from 06/06/2020: Stage Unknown (rcTX, cN0, cM0) - Signed by Heath Lark, MD on 06/06/2020   06/13/2020 Procedure   Technically successful right IJ power-injectable port catheter placement. Ready for routine use   06/20/2020 -  Chemotherapy   The patient had palonosetron (ALOXI) injection 0.25 mg, 0.25 mg, Intravenous,  Once, 5 of 5 cycles Administration: 0.25 mg (06/20/2020), 0.25 mg (06/27/2020), 0.25 mg (07/04/2020), 0.25 mg (07/11/2020), 0.25 mg (07/18/2020) CISplatin (PLATINOL) 63 mg in sodium chloride 0.9 % 250 mL chemo infusion, 40 mg/m2 = 63 mg, Intravenous,  Once, 5 of 5 cycles Dose modification: 30 mg/m2 (75 % of original dose 40 mg/m2, Cycle 4, Reason: Dose Not Tolerated) Administration: 63 mg (06/20/2020), 63 mg (06/27/2020), 63 mg (07/04/2020), 48 mg (07/11/2020), 49 mg (07/18/2020) fosaprepitant (EMEND) 150 mg in sodium chloride 0.9 % 145 mL IVPB, 150 mg, Intravenous,  Once, 5 of 5 cycles Administration: 150 mg (06/20/2020), 150 mg (06/27/2020), 150 mg (07/04/2020), 150 mg (07/11/2020), 150 mg (07/18/2020)  for chemotherapy treatment.      REVIEW OF SYSTEMS:   Eyes: Denies blurriness of vision Ears, nose, mouth, throat, and face: Denies mucositis or sore throat Respiratory: Denies cough, dyspnea or wheezes Cardiovascular: Denies palpitation, chest discomfort or lower extremity swelling Skin: Denies abnormal skin rashes Lymphatics: Denies new lymphadenopathy or easy bruising Neurological:Denies numbness, tingling or new weaknesses Behavioral/Psych: Mood is stable, no new changes  All other systems were reviewed with the patient and are negative.  I have  reviewed the past medical history, past surgical history, social history and family history with the patient and they are unchanged from previous note.  ALLERGIES:  is allergic to duloxetine.  MEDICATIONS:  Current Outpatient Medications  Medication Sig Dispense Refill  . Cyanocobalamin (VITAMIN B 12 PO) Take 1,000 mcg by mouth daily.    Marland Kitchen donepezil (ARICEPT) 10 MG tablet Take 10 mg by mouth at bedtime.    . ergocalciferol (VITAMIN D2) 50000 UNITS capsule Take 50,000 Units by mouth every Tuesday.     . flecainide (TAMBOCOR) 50 MG tablet Take 1 tablet (50 mg total) by mouth 2 (two) times daily. 180 tablet 3  . lidocaine-prilocaine (EMLA) cream Apply to affected area once 30 g 3  . metoprolol tartrate (LOPRESSOR) 25 MG tablet Take 1 tablet (25 mg total) by mouth 2 (two) times daily. [place]  tablet 2  . mirtazapine (REMERON) 15 MG tablet Take 1 tablet (15 mg total) by mouth at bedtime. 30 tablet 11  . Omega-3 Fatty Acids (FISH OIL) 1200 MG CAPS Take 2 capsules by mouth every morning.    . ondansetron (ZOFRAN) 8 MG tablet Take 1 tablet (8 mg total) by mouth every 8 (eight) hours as needed. Start on the third day after cisplatin chemotherapy. 30 tablet 1  . oxyCODONE-acetaminophen (PERCOCET/ROXICET) 5-325 MG tablet Take 1 tablet by mouth every 4 (four) hours as needed for severe pain. 30 tablet 0  . prochlorperazine (COMPAZINE) 10 MG tablet Take 1 tablet (10 mg total) by mouth every 6 (six) hours as needed (Nausea or vomiting). 30 tablet 1  . sertraline (ZOLOFT) 100 MG tablet Take 1 tablet (100 mg total) by mouth daily. 90 tablet 3   No current facility-administered medications for this visit.    PHYSICAL EXAMINATION: ECOG PERFORMANCE STATUS: 1 - Symptomatic but completely ambulatory  Vitals:   10/11/20 1206  BP: (!) 145/71  Pulse: 71  Resp: 16  Temp: 97.9 F (36.6 C)  SpO2: 100%   Filed Weights   10/11/20 1206  Weight: 120 lb 3.2 oz (54.5 kg)    GENERAL:alert, no distress and  comfortable Musculoskeletal:no cyanosis of digits and no clubbing  NEURO: alert & oriented x 3 with fluent speech, no focal motor/sensory deficits  LABORATORY DATA:  I have reviewed the data as listed    Component Value Date/Time   NA 141 10/11/2020 1246   NA 139 09/01/2018 1550   NA 138 11/22/2013 1447   K 3.6 10/11/2020 1246   K 4.2 11/22/2013 1447   CL 108 10/11/2020 1246   CL 104 11/03/2012 1419   CO2 23 10/11/2020 1246   CO2 28 11/22/2013 1447   GLUCOSE 87 10/11/2020 1246   GLUCOSE 319 (H) 11/22/2013 1447   GLUCOSE 119 (H) 11/03/2012 1419   BUN 25 (H) 10/11/2020 1246   BUN 13 09/01/2018 1550   BUN 11.5 11/22/2013 1447   CREATININE 1.00 10/11/2020 1246   CREATININE 0.8 11/22/2013 1447   CALCIUM 9.2 10/11/2020 1246   CALCIUM 10.0 11/22/2013 1447   PROT 8.3 (H) 06/13/2020 1345   PROT 6.9 11/22/2013 1447   ALBUMIN 3.8 06/13/2020 1345   ALBUMIN 3.5 11/22/2013 1447   AST 16 06/13/2020 1345   AST 11 11/22/2013 1447   ALT 9 06/13/2020 1345   ALT 13 11/22/2013 1447   ALKPHOS 66 06/13/2020 1345   ALKPHOS 94 11/22/2013 1447   BILITOT 0.6 06/13/2020 1345   BILITOT 0.20 11/22/2013 1447   GFRNONAA 58 (L) 10/11/2020 1246   GFRAA >60 06/21/2020 1423    No results found for: SPEP, UPEP  Lab Results  Component Value Date   WBC 4.4 10/11/2020   NEUTROABS 2.7 10/11/2020   HGB 8.5 (L) 10/11/2020   HCT 26.5 (L) 10/11/2020   MCV 95.7 10/11/2020   PLT 136 (L) 10/11/2020      Chemistry      Component Value Date/Time   NA 141 10/11/2020 1246   NA 139 09/01/2018 1550   NA 138 11/22/2013 1447   K 3.6 10/11/2020 1246   K 4.2 11/22/2013 1447   CL 108 10/11/2020 1246   CL 104 11/03/2012 1419   CO2 23 10/11/2020 1246   CO2 28 11/22/2013 1447   BUN 25 (H) 10/11/2020 1246   BUN 13 09/01/2018 1550   BUN 11.5 11/22/2013 1447   CREATININE 1.00 10/11/2020 1246  CREATININE 0.8 11/22/2013 1447      Component Value Date/Time   CALCIUM 9.2 10/11/2020 1246   CALCIUM 10.0  11/22/2013 1447   ALKPHOS 66 06/13/2020 1345   ALKPHOS 94 11/22/2013 1447   AST 16 06/13/2020 1345   AST 11 11/22/2013 1447   ALT 9 06/13/2020 1345   ALT 13 11/22/2013 1447   BILITOT 0.6 06/13/2020 1345   BILITOT 0.20 11/22/2013 1447

## 2020-10-12 ENCOUNTER — Telehealth: Payer: Self-pay | Admitting: Hematology and Oncology

## 2020-10-12 NOTE — Telephone Encounter (Signed)
Scheduled apt per 1/19 sch msg - pt son is aware of appt.

## 2020-10-17 ENCOUNTER — Other Ambulatory Visit: Payer: Medicare PPO

## 2020-10-17 ENCOUNTER — Ambulatory Visit: Payer: Medicare PPO | Admitting: Hematology and Oncology

## 2020-10-26 DIAGNOSIS — C519 Malignant neoplasm of vulva, unspecified: Secondary | ICD-10-CM | POA: Diagnosis not present

## 2020-12-06 ENCOUNTER — Inpatient Hospital Stay: Payer: Medicare PPO

## 2020-12-06 ENCOUNTER — Ambulatory Visit: Payer: Medicare PPO | Admitting: Hematology and Oncology

## 2020-12-14 ENCOUNTER — Other Ambulatory Visit: Payer: Self-pay

## 2020-12-14 ENCOUNTER — Inpatient Hospital Stay: Payer: Medicare PPO | Attending: Hematology and Oncology | Admitting: Hematology and Oncology

## 2020-12-14 ENCOUNTER — Inpatient Hospital Stay: Payer: Medicare PPO

## 2020-12-14 ENCOUNTER — Encounter: Payer: Self-pay | Admitting: Hematology and Oncology

## 2020-12-14 DIAGNOSIS — Z7189 Other specified counseling: Secondary | ICD-10-CM

## 2020-12-14 DIAGNOSIS — C519 Malignant neoplasm of vulva, unspecified: Secondary | ICD-10-CM | POA: Insufficient documentation

## 2020-12-14 DIAGNOSIS — R4181 Age-related cognitive decline: Secondary | ICD-10-CM | POA: Diagnosis not present

## 2020-12-14 DIAGNOSIS — R4189 Other symptoms and signs involving cognitive functions and awareness: Secondary | ICD-10-CM | POA: Diagnosis not present

## 2020-12-14 DIAGNOSIS — D61818 Other pancytopenia: Secondary | ICD-10-CM | POA: Insufficient documentation

## 2020-12-14 DIAGNOSIS — D539 Nutritional anemia, unspecified: Secondary | ICD-10-CM

## 2020-12-14 LAB — CBC WITH DIFFERENTIAL (CANCER CENTER ONLY)
Abs Immature Granulocytes: 0 10*3/uL (ref 0.00–0.07)
Basophils Absolute: 0 10*3/uL (ref 0.0–0.1)
Basophils Relative: 0 %
Eosinophils Absolute: 0.1 10*3/uL (ref 0.0–0.5)
Eosinophils Relative: 3 %
HCT: 31.1 % — ABNORMAL LOW (ref 36.0–46.0)
Hemoglobin: 10.3 g/dL — ABNORMAL LOW (ref 12.0–15.0)
Immature Granulocytes: 0 %
Lymphocytes Relative: 25 %
Lymphs Abs: 1.2 10*3/uL (ref 0.7–4.0)
MCH: 29.9 pg (ref 26.0–34.0)
MCHC: 33.1 g/dL (ref 30.0–36.0)
MCV: 90.1 fL (ref 80.0–100.0)
Monocytes Absolute: 0.6 10*3/uL (ref 0.1–1.0)
Monocytes Relative: 13 %
Neutro Abs: 2.8 10*3/uL (ref 1.7–7.7)
Neutrophils Relative %: 59 %
Platelet Count: 125 10*3/uL — ABNORMAL LOW (ref 150–400)
RBC: 3.45 MIL/uL — ABNORMAL LOW (ref 3.87–5.11)
RDW: 12.4 % (ref 11.5–15.5)
WBC Count: 4.7 10*3/uL (ref 4.0–10.5)
nRBC: 0 % (ref 0.0–0.2)

## 2020-12-14 LAB — BASIC METABOLIC PANEL - CANCER CENTER ONLY
Anion gap: 9 (ref 5–15)
BUN: 31 mg/dL — ABNORMAL HIGH (ref 8–23)
CO2: 25 mmol/L (ref 22–32)
Calcium: 9.3 mg/dL (ref 8.9–10.3)
Chloride: 107 mmol/L (ref 98–111)
Creatinine: 1.3 mg/dL — ABNORMAL HIGH (ref 0.44–1.00)
GFR, Estimated: 43 mL/min — ABNORMAL LOW (ref >60)
Glucose, Bld: 83 mg/dL (ref 70–99)
Potassium: 4.1 mmol/L (ref 3.5–5.1)
Sodium: 141 mmol/L (ref 135–145)

## 2020-12-14 LAB — IRON AND TIBC
Iron: 73 ug/dL (ref 41–142)
Saturation Ratios: 40 % (ref 21–57)
TIBC: 183 ug/dL — ABNORMAL LOW (ref 236–444)
UIBC: 110 ug/dL — ABNORMAL LOW (ref 120–384)

## 2020-12-14 LAB — MAGNESIUM: Magnesium: 1.9 mg/dL (ref 1.7–2.4)

## 2020-12-14 LAB — FERRITIN: Ferritin: 583 ng/mL — ABNORMAL HIGH (ref 11–307)

## 2020-12-14 LAB — VITAMIN B12: Vitamin B-12: 222 pg/mL (ref 180–914)

## 2020-12-14 MED ORDER — HEPARIN SOD (PORK) LOCK FLUSH 100 UNIT/ML IV SOLN
500.0000 [IU] | Freq: Once | INTRAVENOUS | Status: AC
  Administered 2020-12-14: 500 [IU]
  Filled 2020-12-14: qty 5

## 2020-12-14 MED ORDER — SODIUM CHLORIDE 0.9% FLUSH
10.0000 mL | Freq: Once | INTRAVENOUS | Status: AC
  Administered 2020-12-14: 10 mL
  Filled 2020-12-14: qty 10

## 2020-12-14 NOTE — Patient Instructions (Signed)
Implanted Port Insertion, Care After This sheet gives you information about how to care for yourself after your procedure. Your health care provider may also give you more specific instructions. If you have problems or questions, contact your health care provider. What can I expect after the procedure? After the procedure, it is common to have:  Discomfort at the port insertion site.  Bruising on the skin over the port. This should improve over 3-4 days. Follow these instructions at home: Port care  After your port is placed, you will get a manufacturer's information card. The card has information about your port. Keep this card with you at all times.  Take care of the port as told by your health care provider. Ask your health care provider if you or a family member can get training for taking care of the port at home. A home health care nurse may also take care of the port.  Make sure to remember what type of port you have. Incision care  Follow instructions from your health care provider about how to take care of your port insertion site. Make sure you: ? Wash your hands with soap and water before and after you change your bandage (dressing). If soap and water are not available, use hand sanitizer. ? Change your dressing as told by your health care provider. ? Leave stitches (sutures), skin glue, or adhesive strips in place. These skin closures may need to stay in place for 2 weeks or longer. If adhesive strip edges start to loosen and curl up, you may trim the loose edges. Do not remove adhesive strips completely unless your health care provider tells you to do that.  Check your port insertion site every day for signs of infection. Check for: ? Redness, swelling, or pain. ? Fluid or blood. ? Warmth. ? Pus or a bad smell.      Activity  Return to your normal activities as told by your health care provider. Ask your health care provider what activities are safe for you.  Do not  lift anything that is heavier than 10 lb (4.5 kg), or the limit that you are told, until your health care provider says that it is safe. General instructions  Take over-the-counter and prescription medicines only as told by your health care provider.  Do not take baths, swim, or use a hot tub until your health care provider approves. Ask your health care provider if you may take showers. You may only be allowed to take sponge baths.  Do not drive for 24 hours if you were given a sedative during your procedure.  Wear a medical alert bracelet in case of an emergency. This will tell any health care providers that you have a port.  Keep all follow-up visits as told by your health care provider. This is important. Contact a health care provider if:  You cannot flush your port with saline as directed, or you cannot draw blood from the port.  You have a fever or chills.  You have redness, swelling, or pain around your port insertion site.  You have fluid or blood coming from your port insertion site.  Your port insertion site feels warm to the touch.  You have pus or a bad smell coming from the port insertion site. Get help right away if:  You have chest pain or shortness of breath.  You have bleeding from your port that you cannot control. Summary  Take care of the port as told by your   health care provider. Keep the manufacturer's information card with you at all times.  Change your dressing as told by your health care provider.  Contact a health care provider if you have a fever or chills or if you have redness, swelling, or pain around your port insertion site.  Keep all follow-up visits as told by your health care provider. This information is not intended to replace advice given to you by your health care provider. Make sure you discuss any questions you have with your health care provider. Document Revised: 04/07/2018 Document Reviewed: 04/07/2018 Elsevier Patient Education   2021 [employer]  

## 2020-12-14 NOTE — Progress Notes (Signed)
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[facility]) I have reviewed documentation by her GYN surgeon Recent vulvar biopsy came back nonmalignant I recommend she follows with her GYN surgeon only for now In the absence of need for systemic treatment, I recommend port removal Since the patient have dementia and her son who is the POA is not available, I have given specific instruction to her caregiver today to relay the information to her son and for her son to call me if he agrees with the plan of care  Pancytopenia, acquired [facility]) She has slight persistent mild pancytopenia, stable since her last visit Observe closely for now  Age-related cognitive decline She has significant cognitive decline She does not remember to eat or drink and she has low body weight In the past, while she is receiving treatment, it was very difficult to remind the patient to eat and drink and multiple discussions has been made with her son about this I recommend she follows with her primary care doctor to consider treatment for dementia I do not recommend future follow-up and chemotherapy because she tolerated treatment poorly in the past because of this   No orders of the defined types were placed in this encounter.   All questions were answered. The patient knows to call the clinic with any problems, questions or concerns. The total time spent in the appointment was 20 minutes encounter with patients including review of chart and various tests results, discussions about plan of care and coordination of care plan   Heath Lark, MD 12/14/2020 12:55 PM  INTERVAL HISTORY: Please see below for problem oriented charting. She returns with her caregiver who happens to be my patient Her son is not able to attend the meeting She missed appointment recently I reviewed documentation  electronically She saw Dr. 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 Freddi Che), multiple vulvar re-excision  Surgeon: Margaretmary Bayley, MD; Andi Devon MD  Findings: Bilateral sentinel LN identified, both hot and blue. See Dr. Bethel Born note for full details. Posterior vulva with evidence of partial wound breakdown without evidence of infection. Granulation tissue noted with exposed subcutaneous sutures. 2x2cm concerning nodular plaque at 7 o'clock, medial margin <1cm from anus. Rectal exam at conclusion normal.   Specimens: 1. Left vulvar margin 2. Right vulvar margin 3. Left sentinel inguinal LN 4. Right sentinel inguinal LN 5. Vulvar lesion  at 7 oclock, stitch at 12 oclock    03/04/2016 Pathology Results   A: Lymph node, right inguinal sentinel #2, removal - 1 lymph node negative for metastatic carcinoma (0/1). - Pan cytokeratin confirmatory  (negative).   B: Lymph node, right inguinal sentinel #1, removal  - 2 lymph nodes, negative for metastatic carcinoma (0/2). - Pan cytokeratin confirmatory (negative).   C: Lymph node, left inguinal sentinel #1, removal  - 1 lymph node negative for metastatic carcinoma (0/1). - Pan cytokeratin confirmatory (negative).   D: Vulva, left margin, biopsy  - Ulcerated squamous tissue and granulation tissue. - Acute inflammation and suture granuloma. - No dysplasia or malignancy identified.   E: Vulva, right margin, biopsy  - Skin and subcutaneous tissue with fat necrosis, ulceration, fibrosis and granulation tissue. - No dysplasia or malignancy identified.   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 - Margins for invasive carcinoma are negative (deep 4 mm, 3 o'clock half 5 mm and 9 o'clock half 7 mm) - Margins for HSIL:  3-o'clock half positive for VIN 2. - Other findings: Fat necrosis, fibrosis and chronic inflammation   04/11/2020 Pathology Results   1. Vulva, biopsy, top - AT LEAST SQUAMOUS CELL CARCINOMA IN SITU, SUSPICIOUS FOR INVASION. SEE NOTE 2. Vulva, biopsy, middle - SQUAMOUS CARCINOMA IN SITU. SEE NOTE 3. Vulva, biopsy, bottom - SQUAMOUS CARCINOMA IN SITU. SEE NOTE   05/03/2020 PET scan   Hypermetabolic bilateral vulvar skin thickening max SUV 19, consistent with patient's known malignancy.   Hypermetabolic bilateral inguinal lymph nodes.  Right inguinal node measuring 16 x 25 mm max SUV 6.9  Left inguinal lymph node measuring 12 x 15 mm max SUV 5.3   No other worrisome hypermetabolic activity identified.   Additional findings:  Lobulated bilateral renal cortex suggesting scarring.  Postop change  with the left breast.   05/16/2020 Surgery   Surgeon(s) and Role: * Hart Rochester, MD - Primary  Specimens:  ID Type Source Tests Collected by Time  1 : RIGHT INGUINAL Tissue Lymph Node PATHOLOGY TISSUE REQUEST Hart Rochester, MD 05/16/2020 [id number]  2 : LEFT INGUINAL Tissue Lymph Node PATHOLOGY TISSUE REQUEST Hart Rochester, MD 05/16/2020 [phone]    Findings:  Bilateral inguinal nodes c/w metastatic disease. On exam under anesthesia, the patient was noted to have a large pedunculated mass emanating from the left labia. This was fairly mobile and did not appear to be deeply invasive. Further evaluation of this pedunculated mass revealed that there was disease crossing the perineal body extending up to the right labia and into the vaginal introitus. This was along the area of prior surgical resection. This did not appear to extend to the urethral meatus but full visualization of this was somewhat difficult given the bulkiness of the tumor and the friability. It was not felt that this was resectable based on the extent of disease and size of the lesion.    05/16/2020 Pathology Results   1.  Right inguinal lymph node, resection:      -Two lymph nodes, negative for carcinoma (0/2). 2.  Left inguinal lymph node, resection:      -Three lymph nodes, negative for carcinoma (0/3).   06/06/2020 Cancer Staging   Staging form:  Vulva, AJCC 8th Edition - Clinical stage from 06/06/2020: Stage Unknown (rcTX, cN0, cM0) - Signed by Heath Lark, MD on 06/06/2020   06/13/2020 Procedure   Technically successful right IJ power-injectable port catheter placement. Ready for routine use   06/20/2020 -  Chemotherapy   The patient had palonosetron (ALOXI) injection 0.25 mg, 0.25 mg, Intravenous,  Once, 5 of 5 cycles Administration: 0.25 mg (06/20/2020), 0.25 mg (06/27/2020), 0.25 mg (07/04/2020), 0.25 mg (07/11/2020), 0.25 mg (07/18/2020) CISplatin (PLATINOL) 63 mg in sodium chloride 0.9 % 250 mL chemo infusion, 40  mg/m2 = 63 mg, Intravenous,  Once, 5 of 5 cycles Dose modification: 30 mg/m2 (75 % of original dose 40 mg/m2, Cycle 4, Reason: Dose Not Tolerated) Administration: 63 mg (06/20/2020), 63 mg (06/27/2020), 63 mg (07/04/2020), 48 mg (07/11/2020), 49 mg (07/18/2020) fosaprepitant (EMEND) 150 mg in sodium chloride 0.9 % 145 mL IVPB, 150 mg, Intravenous,  Once, 5 of 5 cycles Administration: 150 mg (06/20/2020), 150 mg (06/27/2020), 150 mg (07/04/2020), 150 mg (07/11/2020), 150 mg (07/18/2020)  for chemotherapy treatment.      REVIEW OF SYSTEMS:   Constitutional: Denies fevers, chills or abnormal weight loss Eyes: Denies blurriness of vision Ears, nose, mouth, throat, and face: Denies mucositis or sore throat Respiratory: Denies cough, dyspnea or wheezes Cardiovascular: Denies palpitation, chest discomfort or lower extremity swelling Gastrointestinal:  Denies nausea, heartburn or change in bowel habits Skin: Denies abnormal skin rashes Lymphatics: Denies new lymphadenopathy or easy bruising Neurological:Denies numbness, tingling or new weaknesses Behavioral/Psych: Mood is stable, no new changes  All other systems were reviewed with the patient and are negative.  I have reviewed the past medical history, past surgical history, social history and family history with the patient and they are unchanged from previous note.  ALLERGIES:  is allergic to duloxetine.  MEDICATIONS:  Current Outpatient Medications  Medication Sig Dispense Refill  . Cyanocobalamin (VITAMIN B 12 PO) Take 1,000 mcg by mouth daily.    Marland Kitchen donepezil (ARICEPT) 10 MG tablet Take 10 mg by mouth at bedtime.    . ergocalciferol (VITAMIN D2) 50000 UNITS capsule Take 50,000 Units by mouth every Tuesday.     . flecainide (TAMBOCOR) 50 MG tablet Take 1 tablet (50 mg total) by mouth 2 (two) times daily. 180 tablet 3  . lidocaine-prilocaine (EMLA) cream Apply to affected area once 30 g 3  . metoprolol tartrate (LOPRESSOR) 25 MG tablet Take  1 tablet (25 mg total) by mouth 2 (two) times daily. 180 tablet 2  . mirtazapine (REMERON) 15 MG tablet Take 1 tablet (15 mg total) by mouth at bedtime. 30 tablet 11  . Omega-3 Fatty Acids (FISH OIL) 1200 MG CAPS Take 2 capsules by mouth every morning.    . ondansetron (ZOFRAN) 8 MG tablet Take 1 tablet (8 mg total) by mouth every 8 (eight) hours as needed. Start on the third day after cisplatin chemotherapy. 30 tablet 1  . oxyCODONE-acetaminophen (PERCOCET/ROXICET) 5-325 MG tablet Take 1 tablet by mouth every 4 (four) hours as needed for severe pain. 30 tablet 0  . prochlorperazine (COMPAZINE) 10 MG tablet Take 1 tablet (10 mg total) by mouth every 6 (six) hours as needed (Nausea or vomiting). 30 tablet 1  . sertraline (ZOLOFT) 100 MG tablet Take 1 tablet (100 mg total) by mouth daily. 90 tablet 3   No current facility-administered medications for this visit.    PHYSICAL EXAMINATION: ECOG PERFORMANCE STATUS: 1 - Symptomatic but completely ambulatory  Vitals:   12/14/20 1127  BP: (!) 141/75  Pulse: 77  Resp: 18  Temp: (!) 97.4 F (36.3 C)  SpO2: 98%   Filed Weights   12/14/20 1127  Weight: 120 lb 9.6 oz (54.7 kg)    GENERAL:alert, no distress and comfortable NEURO: alert & oriented x 3 with fluent speech, no focal motor/sensory deficits  LABORATORY DATA:  I have reviewed the data as listed    Component Value Date/Time   NA 141 12/14/2020 1117   NA 139 09/01/2018 1550   NA 138 11/22/2013 1447   K 4.1 12/14/2020 1117   K 4.2 11/22/2013 1447   CL 107 12/14/2020 1117   CL 104 11/03/2012 1419   CO2 25 12/14/2020 1117   CO2 28 11/22/2013 1447   GLUCOSE 83 12/14/2020 1117   GLUCOSE 319 (H) 11/22/2013 1447   GLUCOSE 119 (H) 11/03/2012 1419   BUN 31 (H) 12/14/2020 1117   BUN 13 09/01/2018 1550   BUN 11.5 11/22/2013 1447   CREATININE 1.30 (H) 12/14/2020 1117   CREATININE 0.8 11/22/2013 1447   CALCIUM 9.3 12/14/2020 1117   CALCIUM 10.0 11/22/2013 1447   PROT 8.3 (H)  06/13/2020 1345   PROT 6.9 11/22/2013 1447   ALBUMIN 3.8 06/13/2020 1345   ALBUMIN 3.5 11/22/2013 1447   AST 16 06/13/2020 1345   AST 11 11/22/2013 1447   ALT 9 06/13/2020 1345   ALT 13 11/22/2013 1447   ALKPHOS 66 06/13/2020 1345   ALKPHOS 94 11/22/2013 1447   BILITOT 0.6 06/13/2020 1345   BILITOT 0.20 11/22/2013 1447   GFRNONAA 43 (L) 12/14/2020 1117   GFRAA >60 06/21/2020 1423    No results found for: SPEP, UPEP  Lab Results  Component Value Date   WBC 4.7 12/14/2020   NEUTROABS 2.8 12/14/2020   HGB 10.3 (L) 12/14/2020   HCT 31.1 (L) 12/14/2020   MCV 90.1 12/14/2020   PLT 125 (L) 12/14/2020      Chemistry      Component Value Date/Time   NA 141 12/14/2020 1117   NA 139 09/01/2018 1550   NA 138 11/22/2013 1447   K 4.1 12/14/2020 1117   K 4.2 11/22/2013 1447   CL 107 12/14/2020 1117   CL 104 11/03/2012 1419   CO2 25 12/14/2020 1117   CO2 28 11/22/2013 1447   BUN 31 (H) 12/14/2020 1117   BUN 13 09/01/2018 1550   BUN 11.5 11/22/2013 1447   CREATININE 1.30 (H) 12/14/2020 1117   CREATININE 0.8 11/22/2013 1447      Component Value Date/Time   CALCIUM 9.3 12/14/2020 1117   CALCIUM 10.0 11/22/2013 1447   ALKPHOS 66 06/13/2020 1345   ALKPHOS 94 11/22/2013 1447   AST 16 06/13/2020 1345   AST 11 11/22/2013 1447   ALT 9 06/13/2020 1345   ALT 13 11/22/2013 1447   BILITOT 0.6 06/13/2020 1345   BILITOT 0.20 11/22/2013 1447

## 2020-12-14 NOTE — Assessment & Plan Note (Signed)
I have reviewed documentation by her GYN surgeon Recent vulvar biopsy came back nonmalignant I recommend she follows with her GYN surgeon only for now In the absence of need for systemic treatment, I recommend port removal Since the patient have dementia and her son who is the POA is not available, I have given specific instruction to her caregiver today to relay the information to her son and for her son to call me if he agrees with the plan of care

## 2020-12-14 NOTE — Assessment & Plan Note (Signed)
She has significant cognitive decline She does not remember to eat or drink and she has low body weight In the past, while she is receiving treatment, it was very difficult to remind the patient to eat and drink and multiple discussions has been made with her son about this I recommend she follows with her primary care doctor to consider treatment for dementia I do not recommend future follow-up and chemotherapy because she tolerated treatment poorly in the past because of this

## 2020-12-14 NOTE — Assessment & Plan Note (Signed)
She has slight persistent mild pancytopenia, stable since her last visit Observe closely for now

## 2020-12-21 DIAGNOSIS — C519 Malignant neoplasm of vulva, unspecified: Secondary | ICD-10-CM | POA: Diagnosis not present

## 2021-01-02 ENCOUNTER — Encounter (HOSPITAL_COMMUNITY): Payer: Self-pay

## 2021-01-02 ENCOUNTER — Other Ambulatory Visit: Payer: Self-pay

## 2021-01-02 ENCOUNTER — Emergency Department (HOSPITAL_COMMUNITY): Payer: Medicare PPO

## 2021-01-02 ENCOUNTER — Inpatient Hospital Stay (HOSPITAL_COMMUNITY)
Admission: EM | Admit: 2021-01-02 | Discharge: 2021-01-13 | DRG: 330 | Disposition: A | Discharge status: Home Health | Payer: Medicare PPO | Attending: Internal Medicine | Admitting: Internal Medicine

## 2021-01-02 DIAGNOSIS — K565 Intestinal adhesions [bands], unspecified as to partial versus complete obstruction: Secondary | ICD-10-CM | POA: Diagnosis present

## 2021-01-02 DIAGNOSIS — R112 Nausea with vomiting, unspecified: Secondary | ICD-10-CM

## 2021-01-02 DIAGNOSIS — F0150 Vascular dementia without behavioral disturbance: Secondary | ICD-10-CM | POA: Diagnosis present

## 2021-01-02 DIAGNOSIS — K567 Ileus, unspecified: Secondary | ICD-10-CM

## 2021-01-02 DIAGNOSIS — D72825 Bandemia: Secondary | ICD-10-CM | POA: Diagnosis not present

## 2021-01-02 DIAGNOSIS — E7800 Pure hypercholesterolemia, unspecified: Secondary | ICD-10-CM | POA: Diagnosis present

## 2021-01-02 DIAGNOSIS — K5939 Other megacolon: Secondary | ICD-10-CM | POA: Diagnosis not present

## 2021-01-02 DIAGNOSIS — Z8544 Personal history of malignant neoplasm of other female genital organs: Secondary | ICD-10-CM

## 2021-01-02 DIAGNOSIS — Z950 Presence of cardiac pacemaker: Secondary | ICD-10-CM

## 2021-01-02 DIAGNOSIS — R1032 Left lower quadrant pain: Secondary | ICD-10-CM | POA: Diagnosis not present

## 2021-01-02 DIAGNOSIS — G3109 Other frontotemporal dementia: Secondary | ICD-10-CM | POA: Diagnosis not present

## 2021-01-02 DIAGNOSIS — E1122 Type 2 diabetes mellitus with diabetic chronic kidney disease: Secondary | ICD-10-CM | POA: Diagnosis present

## 2021-01-02 DIAGNOSIS — R109 Unspecified abdominal pain: Secondary | ICD-10-CM | POA: Diagnosis not present

## 2021-01-02 DIAGNOSIS — H3530 Unspecified macular degeneration: Secondary | ICD-10-CM | POA: Diagnosis present

## 2021-01-02 DIAGNOSIS — D6481 Anemia due to antineoplastic chemotherapy: Secondary | ICD-10-CM | POA: Diagnosis not present

## 2021-01-02 DIAGNOSIS — E860 Dehydration: Secondary | ICD-10-CM | POA: Diagnosis present

## 2021-01-02 DIAGNOSIS — Z888 Allergy status to other drugs, medicaments and biological substances status: Secondary | ICD-10-CM

## 2021-01-02 DIAGNOSIS — E870 Hyperosmolality and hypernatremia: Secondary | ICD-10-CM | POA: Diagnosis not present

## 2021-01-02 DIAGNOSIS — Z79899 Other long term (current) drug therapy: Secondary | ICD-10-CM

## 2021-01-02 DIAGNOSIS — D696 Thrombocytopenia, unspecified: Secondary | ICD-10-CM | POA: Diagnosis present

## 2021-01-02 DIAGNOSIS — K9189 Other postprocedural complications and disorders of digestive system: Secondary | ICD-10-CM | POA: Diagnosis present

## 2021-01-02 DIAGNOSIS — Z978 Presence of other specified devices: Secondary | ICD-10-CM

## 2021-01-02 DIAGNOSIS — K5989 Other specified functional intestinal disorders: Secondary | ICD-10-CM | POA: Diagnosis not present

## 2021-01-02 DIAGNOSIS — Z515 Encounter for palliative care: Secondary | ICD-10-CM

## 2021-01-02 DIAGNOSIS — K56609 Unspecified intestinal obstruction, unspecified as to partial versus complete obstruction: Principal | ICD-10-CM

## 2021-01-02 DIAGNOSIS — E440 Moderate protein-calorie malnutrition: Secondary | ICD-10-CM | POA: Diagnosis present

## 2021-01-02 DIAGNOSIS — Z853 Personal history of malignant neoplasm of breast: Secondary | ICD-10-CM

## 2021-01-02 DIAGNOSIS — R1110 Vomiting, unspecified: Secondary | ICD-10-CM | POA: Diagnosis not present

## 2021-01-02 DIAGNOSIS — K56699 Other intestinal obstruction unspecified as to partial versus complete obstruction: Secondary | ICD-10-CM | POA: Diagnosis not present

## 2021-01-02 DIAGNOSIS — Z6823 Body mass index (BMI) 23.0-23.9, adult: Secondary | ICD-10-CM

## 2021-01-02 DIAGNOSIS — D649 Anemia, unspecified: Secondary | ICD-10-CM | POA: Diagnosis present

## 2021-01-02 DIAGNOSIS — R1031 Right lower quadrant pain: Secondary | ICD-10-CM | POA: Diagnosis not present

## 2021-01-02 DIAGNOSIS — F0390 Unspecified dementia without behavioral disturbance: Secondary | ICD-10-CM | POA: Diagnosis not present

## 2021-01-02 DIAGNOSIS — D72829 Elevated white blood cell count, unspecified: Secondary | ICD-10-CM | POA: Diagnosis not present

## 2021-01-02 DIAGNOSIS — J069 Acute upper respiratory infection, unspecified: Secondary | ICD-10-CM | POA: Diagnosis not present

## 2021-01-02 DIAGNOSIS — N390 Urinary tract infection, site not specified: Secondary | ICD-10-CM | POA: Diagnosis present

## 2021-01-02 DIAGNOSIS — R531 Weakness: Secondary | ICD-10-CM | POA: Diagnosis not present

## 2021-01-02 DIAGNOSIS — I442 Atrioventricular block, complete: Secondary | ICD-10-CM | POA: Diagnosis present

## 2021-01-02 DIAGNOSIS — Z8679 Personal history of other diseases of the circulatory system: Secondary | ICD-10-CM | POA: Diagnosis not present

## 2021-01-02 DIAGNOSIS — K529 Noninfective gastroenteritis and colitis, unspecified: Secondary | ICD-10-CM | POA: Diagnosis present

## 2021-01-02 DIAGNOSIS — Z4682 Encounter for fitting and adjustment of non-vascular catheter: Secondary | ICD-10-CM | POA: Diagnosis not present

## 2021-01-02 DIAGNOSIS — R197 Diarrhea, unspecified: Secondary | ICD-10-CM | POA: Diagnosis not present

## 2021-01-02 DIAGNOSIS — Z90710 Acquired absence of both cervix and uterus: Secondary | ICD-10-CM

## 2021-01-02 DIAGNOSIS — Z20822 Contact with and (suspected) exposure to covid-19: Secondary | ICD-10-CM | POA: Diagnosis present

## 2021-01-02 DIAGNOSIS — R1010 Upper abdominal pain, unspecified: Secondary | ICD-10-CM | POA: Diagnosis not present

## 2021-01-02 DIAGNOSIS — N179 Acute kidney failure, unspecified: Secondary | ICD-10-CM | POA: Diagnosis present

## 2021-01-02 DIAGNOSIS — E785 Hyperlipidemia, unspecified: Secondary | ICD-10-CM | POA: Diagnosis present

## 2021-01-02 DIAGNOSIS — F0280 Dementia in other diseases classified elsewhere without behavioral disturbance: Secondary | ICD-10-CM | POA: Diagnosis not present

## 2021-01-02 DIAGNOSIS — Z8543 Personal history of malignant neoplasm of ovary: Secondary | ICD-10-CM

## 2021-01-02 DIAGNOSIS — K5669 Other partial intestinal obstruction: Secondary | ICD-10-CM | POA: Diagnosis not present

## 2021-01-02 DIAGNOSIS — R54 Age-related physical debility: Secondary | ICD-10-CM | POA: Diagnosis present

## 2021-01-02 DIAGNOSIS — E876 Hypokalemia: Secondary | ICD-10-CM

## 2021-01-02 DIAGNOSIS — K6389 Other specified diseases of intestine: Secondary | ICD-10-CM | POA: Diagnosis not present

## 2021-01-02 DIAGNOSIS — N1831 Chronic kidney disease, stage 3a: Secondary | ICD-10-CM | POA: Diagnosis present

## 2021-01-02 DIAGNOSIS — R1030 Lower abdominal pain, unspecified: Secondary | ICD-10-CM | POA: Diagnosis not present

## 2021-01-02 DIAGNOSIS — R159 Full incontinence of feces: Secondary | ICD-10-CM | POA: Diagnosis not present

## 2021-01-02 DIAGNOSIS — R509 Fever, unspecified: Secondary | ICD-10-CM | POA: Diagnosis not present

## 2021-01-02 DIAGNOSIS — E1165 Type 2 diabetes mellitus with hyperglycemia: Secondary | ICD-10-CM | POA: Diagnosis present

## 2021-01-02 DIAGNOSIS — R7989 Other specified abnormal findings of blood chemistry: Secondary | ICD-10-CM | POA: Diagnosis not present

## 2021-01-02 DIAGNOSIS — Z7189 Other specified counseling: Secondary | ICD-10-CM | POA: Diagnosis not present

## 2021-01-02 DIAGNOSIS — I714 Abdominal aortic aneurysm, without rupture: Secondary | ICD-10-CM | POA: Diagnosis present

## 2021-01-02 DIAGNOSIS — Z0189 Encounter for other specified special examinations: Secondary | ICD-10-CM

## 2021-01-02 DIAGNOSIS — E559 Vitamin D deficiency, unspecified: Secondary | ICD-10-CM | POA: Diagnosis not present

## 2021-01-02 LAB — CBC
HCT: 35.1 % — ABNORMAL LOW (ref 36.0–46.0)
Hemoglobin: 11.6 g/dL — ABNORMAL LOW (ref 12.0–15.0)
MCH: 30.1 pg (ref 26.0–34.0)
MCHC: 33 g/dL (ref 30.0–36.0)
MCV: 90.9 fL (ref 80.0–100.0)
Platelets: 168 10*3/uL (ref 150–400)
RBC: 3.86 MIL/uL — ABNORMAL LOW (ref 3.87–5.11)
RDW: 12.6 % (ref 11.5–15.5)
WBC: 11.7 10*3/uL — ABNORMAL HIGH (ref 4.0–10.5)
nRBC: 0 % (ref 0.0–0.2)

## 2021-01-02 LAB — URINALYSIS, ROUTINE W REFLEX MICROSCOPIC
Bilirubin Urine: NEGATIVE
Glucose, UA: 50 mg/dL — AB
Hgb urine dipstick: NEGATIVE
Ketones, ur: 20 mg/dL — AB
Nitrite: NEGATIVE
Protein, ur: NEGATIVE mg/dL
Specific Gravity, Urine: 1.018 (ref 1.005–1.030)
pH: 5 (ref 5.0–8.0)

## 2021-01-02 LAB — CBC WITH DIFFERENTIAL/PLATELET
Abs Immature Granulocytes: 0.05 10*3/uL (ref 0.00–0.07)
Basophils Absolute: 0 10*3/uL (ref 0.0–0.1)
Basophils Relative: 0 %
Eosinophils Absolute: 0 10*3/uL (ref 0.0–0.5)
Eosinophils Relative: 0 %
HCT: 32.6 % — ABNORMAL LOW (ref 36.0–46.0)
Hemoglobin: 10.6 g/dL — ABNORMAL LOW (ref 12.0–15.0)
Immature Granulocytes: 1 %
Lymphocytes Relative: 10 %
Lymphs Abs: 1 10*3/uL (ref 0.7–4.0)
MCH: 29.3 pg (ref 26.0–34.0)
MCHC: 32.5 g/dL (ref 30.0–36.0)
MCV: 90.1 fL (ref 80.0–100.0)
Monocytes Absolute: 0.3 10*3/uL (ref 0.1–1.0)
Monocytes Relative: 4 %
Neutro Abs: 8.1 10*3/uL — ABNORMAL HIGH (ref 1.7–7.7)
Neutrophils Relative %: 85 %
Platelets: 171 10*3/uL (ref 150–400)
RBC: 3.62 MIL/uL — ABNORMAL LOW (ref 3.87–5.11)
RDW: 12.6 % (ref 11.5–15.5)
WBC: 9.4 10*3/uL (ref 4.0–10.5)
nRBC: 0 % (ref 0.0–0.2)

## 2021-01-02 LAB — RESP PANEL BY RT-PCR (FLU A&B, COVID) ARPGX2
Influenza A by PCR: NEGATIVE
Influenza B by PCR: NEGATIVE
SARS Coronavirus 2 by RT PCR: NEGATIVE

## 2021-01-02 LAB — COMPREHENSIVE METABOLIC PANEL
ALT: 16 U/L (ref 0–44)
AST: 27 U/L (ref 15–41)
Albumin: 4 g/dL (ref 3.5–5.0)
Alkaline Phosphatase: 78 U/L (ref 38–126)
Anion gap: 14 (ref 5–15)
BUN: 31 mg/dL — ABNORMAL HIGH (ref 8–23)
CO2: 17 mmol/L — ABNORMAL LOW (ref 22–32)
Calcium: 9.8 mg/dL (ref 8.9–10.3)
Chloride: 109 mmol/L (ref 98–111)
Creatinine, Ser: 1.09 mg/dL — ABNORMAL HIGH (ref 0.44–1.00)
GFR, Estimated: 53 mL/min — ABNORMAL LOW (ref >60)
Glucose, Bld: 260 mg/dL — ABNORMAL HIGH (ref 70–99)
Potassium: 2.9 mmol/L — ABNORMAL LOW (ref 3.5–5.1)
Sodium: 140 mmol/L (ref 135–145)
Total Bilirubin: 0.6 mg/dL (ref 0.3–1.2)
Total Protein: 7.4 g/dL (ref 6.5–8.1)

## 2021-01-02 LAB — CREATININE, SERUM
Creatinine, Ser: 1.12 mg/dL — ABNORMAL HIGH (ref 0.44–1.00)
GFR, Estimated: 51 mL/min — ABNORMAL LOW (ref >60)

## 2021-01-02 MED ORDER — ACETAMINOPHEN 325 MG PO TABS
650.0000 mg | ORAL_TABLET | Freq: Four times a day (QID) | ORAL | Status: DC | PRN
  Administered 2021-01-03 – 2021-01-04 (×3): 650 mg via ORAL
  Filled 2021-01-02 (×3): qty 2

## 2021-01-02 MED ORDER — LACTATED RINGERS IV SOLN: Freq: Once | INTRAVENOUS | Status: AC

## 2021-01-02 MED ORDER — ENOXAPARIN SODIUM 40 MG/0.4ML ~~LOC~~ SOLN
40.0000 mg | SUBCUTANEOUS | Status: DC
  Administered 2021-01-02 – 2021-01-06 (×5): 40 mg via SUBCUTANEOUS
  Filled 2021-01-02 (×5): qty 0.4

## 2021-01-02 MED ORDER — ONDANSETRON HCL 4 MG/2ML IJ SOLN
4.0000 mg | Freq: Once | INTRAMUSCULAR | Status: AC
  Administered 2021-01-02: 4 mg via INTRAVENOUS
  Filled 2021-01-02: qty 2

## 2021-01-02 MED ORDER — FENTANYL CITRATE (PF) 100 MCG/2ML IJ SOLN
50.0000 ug | Freq: Once | INTRAMUSCULAR | Status: AC
  Administered 2021-01-02: 50 ug via INTRAVENOUS
  Filled 2021-01-02: qty 2

## 2021-01-02 MED ORDER — ONDANSETRON HCL 4 MG/2ML IJ SOLN
4.0000 mg | Freq: Four times a day (QID) | INTRAMUSCULAR | Status: DC | PRN
  Administered 2021-01-04 – 2021-01-05 (×2): 4 mg via INTRAVENOUS
  Filled 2021-01-02 (×2): qty 2

## 2021-01-02 MED ORDER — ONDANSETRON HCL 4 MG PO TABS
4.0000 mg | ORAL_TABLET | Freq: Four times a day (QID) | ORAL | Status: DC | PRN
  Administered 2021-01-03 (×2): 4 mg via ORAL
  Filled 2021-01-02 (×2): qty 1

## 2021-01-02 MED ORDER — POTASSIUM CHLORIDE 10 MEQ/100ML IV SOLN
10.0000 meq | INTRAVENOUS | Status: AC
  Administered 2021-01-02: 10 meq via INTRAVENOUS
  Filled 2021-01-02: qty 100

## 2021-01-02 MED ORDER — IOHEXOL 300 MG/ML  SOLN
100.0000 mL | Freq: Once | INTRAMUSCULAR | Status: AC | PRN
  Administered 2021-01-02: 100 mL via INTRAVENOUS

## 2021-01-02 MED ORDER — SODIUM CHLORIDE 0.9 % IV SOLN
1.0000 g | Freq: Once | INTRAVENOUS | Status: AC
  Administered 2021-01-02: 1 g via INTRAVENOUS
  Filled 2021-01-02: qty 10

## 2021-01-02 MED ORDER — ACETAMINOPHEN 120 MG RE SUPP
120.0000 mg | RECTAL | Status: DC | PRN
  Filled 2021-01-02: qty 1

## 2021-01-02 MED ORDER — POTASSIUM CHLORIDE 10 MEQ/100ML IV SOLN
10.0000 meq | INTRAVENOUS | Status: AC
  Administered 2021-01-02 (×4): 10 meq via INTRAVENOUS
  Filled 2021-01-02 (×2): qty 100

## 2021-01-02 NOTE — Consult Note (Addendum)
[facility] Surgery Consult Note  [name] 01-10-1946  [id number].    Requesting MD: Jeanell Sparrow, MD Chief Complaint/Reason for Consult: possible ileus or SBO  HPI:  [name] is a 75 y/o F with a PMH including but not limited to heart block s/p pacemaker placement, diabetes, vulvar cancer, and dementia who presented to the ED with a cc abdominal pain and diarrhea. According to the patient she starting having lower abdominal discomfort, loose stools, and increased urinary output this AM. She denies similar pain in the past. associated sxs include nausea, she denies vomiting. Patients son is at the bedside - he reports being with her all day yesterday and states she was acting normal, they had dinner at a steakhouse last night where she ate a small amount of food and then he took her home. Son also reports she has a history of worsening dementia and memory problems - he states she performs ADLs independently but she is increasingly forgetful. Patient lives with her husband and he is in good health. Patient has a surgical history of hysterctecomy. She has a history of vulvar cancer and radical vulvectomy 01/2016 and 02/2016 with recurrent disease noted in 03/2020. She had bilateral injuinal lymph node dissection followed by chemo/radiation but she is not currently undergoing any treatment. Per son her last systemic therapy was before christmas of 2021. She denies tobacco, alcohol, or drug use.   ED workup: normocytic anemia (stable compared to 2 weeks ago hgb 10.6/32 from 10.3/31), WBC 9.4, hypokalemia (2.9), hyperglycemia (260), UA w/ large leuks, COVID swab negative, CT abd/pelvis showing possible mild dilation of proximal small bowel with some nonspecific fluid in the pelvis. No other acute abnormality. Surgically absent uterus.   ROS: Review of Systems  Constitutional: Negative.   HENT: Negative.   Eyes: Negative.   Respiratory: Negative.   Cardiovascular: Negative.    Gastrointestinal: Positive for abdominal pain, diarrhea and melena.  Genitourinary: Negative for dysuria, frequency and urgency.  Musculoskeletal: Negative.   Skin: Negative.   Neurological: Negative.   Endo/Heme/Allergies: Negative.   Psychiatric/Behavioral: Positive for memory loss.    Family History  Problem Relation Age of Onset  . Dementia Mother   . Other Father        possible Dementia  . Prostate cancer Father   . HIV Brother   . Kidney failure Brother     Past Medical History:  Diagnosis Date  . Bradycardia 06/2018  . Breast cancer ([place])   . Cancer [facility]) left    breast cancer 2005 surgery, chemo and radiation   . Complete heart block ([place])   . Diabetes mellitus   . Family history of prostate cancer   . Headache   . Hypercholesteremia   . Macular degeneration   . Osteoarthritis   . 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 Vitamin D deficiency   . Vulvar cancer ([place]) 2017 new dx    Past Surgical History:  Procedure Laterality Date  . ABDOMINAL HYSTERECTOMY     complete  . BREAST SURGERY Left 2006   left breast lumpectomy , chemo and radiation done  . COLONOSCOPY WITH PROPOFOL N/A 07/24/2015   Procedure: COLONOSCOPY WITH PROPOFOL;  Surgeon: Garlan Fair, MD;  Location: [facility] ENDOSCOPY;  Service: Endoscopy;  Laterality: N/A;  . EYE SURGERY     removal of growth 35 years ago  . HEMORROIDECTOMY     38 years ago, no present symtoms  .  IR IMAGING GUIDED PORT INSERTION  06/13/2020  . KNEE SURGERY Bilateral   . PACEMAKER IMPLANT N/A 07/16/2018   Procedure: PACEMAKER IMPLANT;  Surgeon: Evans Lance, MD;  Location: [facility] LAB;  Service: Cardiovascular;  Laterality: N/A;  . RADICAL VULVECTOMY N/A 02/01/2016   Procedure: RADICAL VULVECTOMY;  Surgeon: [name], MD;  Location: [facility] ORS;  Service: Gynecology;  Laterality: N/A;    Social History:  reports that she has never smoked. She has never used smokeless tobacco.  She reports that she does not drink alcohol and does not use drugs.  Allergies:  Allergies  Allergen Reactions  . Duloxetine Itching    Cymbalta    (Not in a hospital admission)   Blood pressure (!) 169/78, pulse 75, temperature 97.8 F (36.6 C), temperature source Oral, resp. rate (!) 24, SpO2 99 %. Physical Exam: Constitutional: NAD; conversant; no deformities, appears chronically ill with muscle wasting Eyes: Moist conjunctiva; no lid lag; anicteric; PERRL Neck: Trachea midline; no thyromegaly Lungs: Normal respiratory effort; no tactile fremitus CV: RRR; no palpable thrills; no pitting edema GI: Abd soft, overall non-tender- pt with mild epigastric and suprapubic tenderness without guarding or peritonitis, non-distended, no palpable hepatosplenomegaly MSK: symmetrical; no clubbing/cyanosis Psychiatric: Appropriate affect; alert and oriented x3 Lymphatic: No palpable cervical or axillary lymphadenopathy  Results for orders placed or performed during the hospital encounter of 01/02/21 (from the past 48 hour(s))  CBC with Differential/Platelet     Status: Abnormal   Collection Time: 01/02/21  9:30 AM  Result Value Ref Range   WBC 9.4 4.0 - 10.5 K/uL   RBC 3.62 (L) 3.87 - 5.11 MIL/uL   Hemoglobin 10.6 (L) 12.0 - 15.0 g/dL   HCT 32.6 (L) 36.0 - 46.0 %   MCV 90.1 80.0 - 100.0 fL   MCH 29.3 26.0 - 34.0 pg   MCHC 32.5 30.0 - 36.0 g/dL   RDW 12.6 11.5 - 15.5 %   Platelets 171 150 - 400 K/uL   nRBC 0.0 0.0 - 0.2 %   Neutrophils Relative % 85 %   Neutro Abs 8.1 (H) 1.7 - 7.7 K/uL   Lymphocytes Relative 10 %   Lymphs Abs 1.0 0.7 - 4.0 K/uL   Monocytes Relative 4 %   Monocytes Absolute 0.3 0.1 - 1.0 K/uL   Eosinophils Relative 0 %   Eosinophils Absolute 0.0 0.0 - 0.5 K/uL   Basophils Relative 0 %   Basophils Absolute 0.0 0.0 - 0.1 K/uL   Immature Granulocytes 1 %   Abs Immature Granulocytes 0.05 0.00 - 0.07 K/uL    Comment: Performed at [facility], [place] [address], [place],  [address]  Comprehensive metabolic panel     Status: Abnormal   Collection Time: 01/02/21  9:30 AM  Result Value Ref Range   Sodium 140 135 - 145 mmol/L   Potassium 2.9 (L) 3.5 - 5.1 mmol/L   Chloride 109 98 - 111 mmol/L   CO2 17 (L) 22 - 32 mmol/L   Glucose, Bld 260 (H) 70 - 99 mg/dL    Comment: Glucose reference range applies only to samples taken after fasting for at least 8 hours.   BUN 31 (H) 8 - 23 mg/dL   Creatinine, Ser 1.09 (H) 0.44 - 1.00 mg/dL   Calcium 9.8 8.9 - 10.3 mg/dL   Total Protein 7.4 6.5 - 8.1 g/dL   Albumin 4.0 3.5 - 5.0 g/dL   AST 27 15 - 41 U/L  ALT 16 0 - 44 U/L   Alkaline Phosphatase 78 38 - 126 U/L   Total Bilirubin 0.6 0.3 - 1.2 mg/dL   GFR, Estimated 53 (L) >60 mL/min    Comment: (NOTE) Calculated using the CKD-EPI Creatinine Equation (2021)    Anion gap 14 5 - 15    Comment: Performed at [facility], [place] [address], [place], [place] [address]  Urinalysis, Routine w reflex microscopic Nasopharyngeal Swab     Status: Abnormal   Collection Time: 01/02/21 10:14 AM  Result Value Ref Range   Color, Urine YELLOW YELLOW   APPearance CLEAR CLEAR   Specific Gravity, Urine 1.018 1.005 - 1.030   pH 5.0 5.0 - 8.0   Glucose, UA 50 (A) NEGATIVE mg/dL   Hgb urine dipstick NEGATIVE NEGATIVE   Bilirubin Urine NEGATIVE NEGATIVE   Ketones, ur 20 (A) NEGATIVE mg/dL   Protein, ur NEGATIVE NEGATIVE mg/dL   Nitrite NEGATIVE NEGATIVE   Leukocytes,Ua LARGE (A) NEGATIVE   RBC / HPF 0-5 0 - 5 RBC/hpf   WBC, UA 11-20 0 - 5 WBC/hpf   Bacteria, UA FEW (A) NONE SEEN   Mucus PRESENT    Hyaline Casts, UA PRESENT     Comment: Performed at [facility], [place] [address] [place], [place] [address]  Resp Panel by RT-PCR (Flu A&B, Covid) Nasopharyngeal Swab     Status: None   Collection Time: 01/02/21 10:15 AM   Specimen: Nasopharyngeal Swab; Nasopharyngeal(NP) swabs in vial transport medium  Result Value Ref  Range   SARS Coronavirus 2 by RT PCR NEGATIVE NEGATIVE    Comment: (NOTE) SARS-CoV-2 target nucleic acids are NOT DETECTED.  The SARS-CoV-2 RNA is generally detectable in upper respiratory specimens during the acute phase of infection. The lowest concentration of SARS-CoV-2 viral copies this assay can detect is 138 copies/mL. A negative result does not preclude SARS-Cov-2 infection and should not be used as the sole basis for treatment or other patient management decisions. A negative result may occur with  improper specimen collection/handling, submission of specimen other than nasopharyngeal swab, presence of viral mutation(s) within the areas targeted by this assay, and inadequate number of viral copies(<138 copies/mL). A negative result must be combined with clinical observations, patient history, and epidemiological information. The expected result is Negative.  Fact Sheet for Patients:  EntrepreneurPulse.com.au  Fact Sheet for Healthcare Providers:  IncredibleEmployment.be  This test is no t yet approved or cleared by the [place] FDA and  has been authorized for detection and/or diagnosis of SARS-CoV-2 by FDA under an Emergency Use Authorization (EUA). This EUA will remain  in effect (meaning this test can be used) for the duration of the COVID-19 declaration under Section 564(b)(1) of the Act, 21 U.S.C.section 360bbb-3(b)(1), unless the authorization is terminated  or revoked sooner.       Influenza A by PCR NEGATIVE NEGATIVE   Influenza B by PCR NEGATIVE NEGATIVE    Comment: (NOTE) The Xpert Xpress SARS-CoV-2/FLU/RSV plus assay is intended as an aid in the diagnosis of influenza from Nasopharyngeal swab specimens and should not be used as a sole basis for treatment. Nasal washings and aspirates are unacceptable for Xpert Xpress SARS-CoV-2/FLU/RSV testing.  Fact Sheet for  Patients: EntrepreneurPulse.com.au  Fact Sheet for Healthcare Providers: IncredibleEmployment.be  This test is not yet approved or cleared by the [place] FDA and has been authorized for detection and/or diagnosis of SARS-CoV-2 by FDA under an Emergency Use Authorization (EUA). This EUA will  remain in effect (meaning this test can be used) for the duration of the COVID-19 declaration under Section 564(b)(1) of the Act, 21 U.S.C. section 360bbb-3(b)(1), unless the authorization is terminated or revoked.  Performed at [facility], [place] [address] [place], [place] [address]    CT ABDOMEN PELVIS W CONTRAST  Result Date: 01/02/2021 CLINICAL DATA:  Acute abdominal pain. Nonlocalized. History of breast cancer. EXAM: CT ABDOMEN AND PELVIS WITH CONTRAST TECHNIQUE: Multidetector CT imaging of the abdomen and pelvis was performed using the standard protocol following bolus administration of intravenous contrast. CONTRAST:  174mL OMNIPAQUE IOHEXOL 300 MG/ML  SOLN COMPARISON:  None. FINDINGS: Lower chest: Lung bases are clear. Hepatobiliary: No focal hepatic lesion. No biliary duct dilatation. Common bile duct is normal. Pancreas: Pancreas is normal. No ductal dilatation. No pancreatic inflammation. Spleen: Normal spleen Adrenals/urinary tract: Adrenal glands and kidneys are normal. The ureters and bladder normal. Stomach/Bowel: Small hiatal hernia. Stomach and duodenum normal. The small bowel is fluid-filled. No mucosal enhancement or abnormal dilatation. Proximal small bowel measures up to 2.8 cm which is within normal limits. Distal small bowel is small caliber leading up to the terminal ileum. Small amount of free fluid in the RIGHT lower quadrant. Appendix is not identified. Ascending, transverse and descending colon normal. Descending colon relatively collapsed. Small amount fluid along the descending colon (image 34/2). Small fluid collection  pelvis additionally. Vascular/Lymphatic: Abdominal aorta is normal caliber with atherosclerotic calcification. There is no retroperitoneal or periportal lymphadenopathy. No pelvic lymphadenopathy. Reproductive: Post hysterectomy.  Adnexa unremarkable Other: free fluid collects in the pelvis with simple attenuation. No organization Musculoskeletal: No aggressive osseous lesion. IMPRESSION: 1. Prominent fluid-filled loops of small bowel aer upper limits of normal diameter. Small free fluid along the pericolic gutters and collecting in the pelvis. Findings are nonspecific could indicate enteritis. No evidence of high-grade obstruction. 2. Appendix not identified but no secondary signs of acute appendicitis. 3. Gallbladder normal. 4. Post hysterectomy. Electronically Signed   By: Suzy Bouchard M.D.   On: 01/02/2021 12:23   DG Chest Port 1 View  Result Date: 01/02/2021 CLINICAL DATA:  Weakness, abdominal pain.  Vomiting, fever. EXAM: PORTABLE CHEST 1 VIEW COMPARISON:  09/12/2018 FINDINGS: Right Port-A-Cath in place with the tip in the SVC. Left pacer is unchanged. Heart is normal size. Lungs clear. No effusions or edema. No acute bony abnormality. IMPRESSION: No active disease. Electronically Signed   By: Rolm Baptise M.D.   On: 01/02/2021 10:11   Assessment/Plan [name] is a 75 y/o F with above PMH who presented with acute onset upper abdominal pain associated with diarrhea and nausea. Her history was difficult to obtain due to her memory loss. She did eat dinner out at a steak house last night - no other family members became ill after this. Her workup revealed hypokalemia which is likely due to GI losses from diarrhea and possible emesis (unclear history of if she vomited), UA with possible UTI, and CT scan suggestive of possible ileus or enteritis. Her CT scan does show upper-limites of normal caliber loops of bowel in the LUQ but is without significant abnormality or sign of high grade bowel  obstruction. Clinically she is not obstructed and her exam is benign. I question if she has a mild enteritis. She has no emergent surgical needs. From a surgical perspective she may undergo PO challenge and if she is tolerating PO, able to stay hydrated at home, and her pain is improved she could be discharged home.  Management of enteritis and possible UTI per EDP or medical team.    Patient also reports melena this morning - her son could not confirm this during my exam and her husband was not at bedside to confirm this either. Per son, the patient had an upper and lower endoscopy less than a year ago without acute abnormality. Her hgb/hct are stable. If she does have melena I think this could be further evaluated or followed on an outpatient basis.   Jill Alexanders, PA-C [facility] Please see Amion for pager number during day hours 7:00am-4:30pm 01/02/2021, 1:59 PM

## 2021-01-02 NOTE — ED Notes (Signed)
Pt called out for pain med, Sent md a message

## 2021-01-02 NOTE — H&P (Signed)
History and Physical    [name] DOB: Aug 21, 1946 DOA: 01/02/2021  PCP: Glenis Smoker, MD   Chief Complaint: Intractable nausea vomiting abdominal pain  HPI: [name] is a 75 y.o. female with medical history significant of complete heart block status post pacemaker placement, diet controlled diabetes, hyperlipidemia history of breast cancer and vulvar cancer who presents with intractable nausea vomiting and abdominal pain approximately 24 to 48 hours after eating seafood at a restaurant with her family.  No other sick contacts but no one else ate the same meal she had.  She has had extremely poor p.o. intake over the past 24 hours with profound nausea vomiting per son at bedside who is primary historian.  ED Course: In the ED patient was noted to have elevated creatinine with profound hypokalemia, CT shows questionable enteritis and inflammatory changes, surgery initially called for further evaluation and treatment but recommended supportive care at this time.  Patient will be admitted observation for IV fluids, symptom control with hopeful disposition and transition back to p.o. the next 24 hours.  Review of Systems: As per HPI intractable nausea vomiting and abdominal pain without notable chest pain headache fever chills diarrhea constipation.   Assessment/Plan Active Problems:   Intractable vomiting with nausea, unspecified vomiting type    Intractable nausea vomiting abdominal pain, rule out infectious enteritis -Continue IV fluids, supportive care with Zofran, acetaminophen -Holding narcotics given patient's advanced vascular dementia, high risk for sundowning and delirium -Hold off on antibiotics given unclear viral or bacterial etiology, patient appears stable at bedside no acute distress -Start ice chips, advance diet as tolerated  AKI without history of CKD -Continue IV fluids with LR at 100 cc/h -Follow morning labs  Hypokalemia in the  setting of above -Patient was to be given IV potassium in the ED, unclear if this is been given, follow repeat labs -Continue IV fluids with LR, once able to tolerate p.o. we will add p.o. potassium as appropriate  Diet-controlled diabetes -Currently n.p.o., once able to advance the diet will follow along closely -No indication for sliding scale insulin or hypoglycemic protocol given patient's diet controlled diabetes not currently on home medications per chart review  Hyperlipidemia -Resume home medications once med rec verified  Frontotemporal vascular dementia  -Son indicates patient has known vascular dementia making review of systems and exam somewhat difficult.  Patient declines any abdominal pain but remarks on chronic low back pain however during exam patient appears to have transient episode of midepigastric pain but then declined any pain upon further evaluation -Continue to hold CNS depressants, narcotics given patient's advanced risk for sundowning/mental status changes.  Complete heart block status post pacemaker placement -Currently rate controlled, no chest pain or cardiac equivalent at this point, follow clinically  DVT prophylaxis: Lovenox Code Status: Full Family Communication: Son at bedside Status is: Observation  Dispo: The patient is from: Home              Anticipated d/c is to: Home              Anticipated d/c date is: 24 to 48 hours pending clinical course              Patient currently is not medically stable for discharge given need for ongoing IV fluids, IV medications to control patient's pain nausea and vomiting.  Consultants:   General surgery  Procedures:   None planned   Past Medical History:  Diagnosis Date  . Bradycardia 06/2018  .  Breast cancer ([place])   . Cancer [facility]) left    breast cancer 2005 surgery, chemo and radiation   . Complete heart block ([place])   . Diabetes mellitus   . Family history of prostate cancer   . Headache   .  Hypercholesteremia   . Macular degeneration   . Osteoarthritis   . 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 Vitamin D deficiency   . Vulvar cancer ([place]) 2017 new dx    Past Surgical History:  Procedure Laterality Date  . ABDOMINAL HYSTERECTOMY     complete  . BREAST SURGERY Left 2006   left breast lumpectomy , chemo and radiation done  . COLONOSCOPY WITH PROPOFOL N/A 07/24/2015   Procedure: COLONOSCOPY WITH PROPOFOL;  Surgeon: Garlan Fair, MD;  Location: [facility];  Service: Endoscopy;  Laterality: N/A;  . EYE SURGERY     removal of growth 35 years ago  . HEMORROIDECTOMY     38 years ago, no present symtoms  . IR IMAGING GUIDED PORT INSERTION  06/13/2020  . KNEE SURGERY Bilateral   . PACEMAKER IMPLANT N/A 07/16/2018   Procedure: PACEMAKER IMPLANT;  Surgeon: Evans Lance, MD;  Location: [place] CV LAB;  Service: Cardiovascular;  Laterality: N/A;  . RADICAL VULVECTOMY N/A 02/01/2016   Procedure: RADICAL VULVECTOMY;  Surgeon: [name], MD;  Location: [facility];  Service: Gynecology;  Laterality: N/A;     reports that she has never smoked. She has never used smokeless tobacco. She reports that she does not drink alcohol and does not use drugs.  Allergies  Allergen Reactions  . Duloxetine Itching    Cymbalta    Family History  Problem Relation Age of Onset  . Dementia Mother   . Other Father        possible Dementia  . Prostate cancer Father   . HIV Brother   . Kidney failure Brother     Prior to Admission medications   Medication Sig Start Date End Date Taking? Authorizing Provider  Cyanocobalamin (VITAMIN B 12 PO) Take 1,000 mcg by mouth daily.   Yes [provider]  donepezil (ARICEPT) 10 MG tablet Take 10 mg by mouth at bedtime.   Yes [provider]  ergocalciferol (VITAMIN D2) 50000 UNITS capsule Take 50,000 Units by mouth every Tuesday.   Yes [provider]  flecainide (TAMBOCOR) 50 MG  tablet Take 1 tablet (50 mg total) by mouth 2 (two) times daily. 10/20/18  Yes Evans Lance, MD  metoprolol tartrate (LOPRESSOR) 25 MG tablet Take 1 tablet (25 mg total) by mouth 2 (two) times daily. 03/29/19  Yes Evans Lance, MD  Omega-3 Fatty Acids (FISH OIL) 1200 MG CAPS Take 2,400 mg by mouth every morning.   Yes [provider]  sertraline (ZOLOFT) 100 MG tablet Take 1 tablet (100 mg total) by mouth daily. 11/30/19  Yes Melvenia Beam, MD  HYDROcodone-acetaminophen (NORCO/VICODIN) 5-325 MG tablet TAKE 1 TABLET BY MOUTH EVERY 6 HOURS AS NEEDED Patient not taking: Reported on 01/02/2021 07/27/20 01/23/21  Heath Lark, MD  mirtazapine (REMERON) 15 MG tablet Take 1 tablet (15 mg total) by mouth at bedtime. Patient not taking: Reported on 01/02/2021 06/15/20   Heath Lark, MD  oxyCODONE-acetaminophen (PERCOCET/ROXICET) 5-325 MG tablet TAKE 1 TABLET BY MOUTH EVERY 4 HOURS AS NEEDED FOR SEVERE PAIN Patient not taking: Reported on 01/02/2021 08/01/20 01/28/21  Gery Pray, MD    Physical Exam: Vitals:  01/02/21 0916 01/02/21 1045 01/02/21 1057 01/02/21 1330  BP: (!) 186/86 (!) 168/83 (!) 168/83 (!) 169/78  Pulse: (!) 59  (!) 59 75  Resp: 20  16 (!) 24  Temp: 97.6 F (36.4 C) 97.8 F (36.6 C) 97.8 F (36.6 C)   TempSrc: Oral  Oral   SpO2: 100%  100% 99%    Constitutional: NAD, calm, comfortable Vitals:   01/02/21 0916 01/02/21 1045 01/02/21 1057 01/02/21 1330  BP: (!) 186/86 (!) 168/83 (!) 168/83 (!) 169/78  Pulse: (!) 59  (!) 59 75  Resp: 20  16 (!) 24  Temp: 97.6 F (36.4 C) 97.8 F (36.6 C) 97.8 F (36.6 C)   TempSrc: Oral  Oral   SpO2: 100%  100% 99%   General:  Pleasantly resting in bed, No acute distress. HEENT:  Normocephalic atraumatic.  Sclerae nonicteric, noninjected.  Extraocular movements intact bilaterally. Neck:  Without mass or deformity.  Trachea is midline. Lungs:  Clear to auscultate bilaterally without rhonchi, wheeze, or rales. Heart:  Regular rate and  rhythm.  Without murmurs, rubs, or gallops. Abdomen:  Soft, nontender, nondistended.  Without guarding or rebound. Extremities: Without cyanosis, clubbing, edema, or obvious deformity. Vascular:  Dorsalis pedis and posterior tibial pulses palpable bilaterally. Skin:  Warm and dry, no erythema, no ulcerations.  Labs on Admission: I have personally reviewed following labs and imaging studies  CBC: Recent Labs  Lab 01/02/21 0930  WBC 9.4  NEUTROABS 8.1*  HGB 10.6*  HCT 32.6*  MCV 90.1  PLT 858   Basic Metabolic Panel: Recent Labs  Lab 01/02/21 0930  NA 140  K 2.9*  CL 109  CO2 17*  GLUCOSE 260*  BUN 31*  CREATININE 1.09*  CALCIUM 9.8   GFR: CrCl cannot be calculated (Unknown ideal weight.). Liver Function Tests: Recent Labs  Lab 01/02/21 0930  AST 27  ALT 16  ALKPHOS 78  BILITOT 0.6  PROT 7.4  ALBUMIN 4.0   No results for input(s): LIPASE, AMYLASE in the last 168 hours. No results for input(s): AMMONIA in the last 168 hours. Coagulation Profile: No results for input(s): INR, PROTIME in the last 168 hours. Cardiac Enzymes: No results for input(s): CKTOTAL, CKMB, CKMBINDEX, TROPONINI in the last 168 hours. BNP (last 3 results) No results for input(s): PROBNP in the last 8760 hours. HbA1C: No results for input(s): HGBA1C in the last 72 hours. CBG: No results for input(s): GLUCAP in the last 168 hours. Lipid Profile: No results for input(s): CHOL, HDL, LDLCALC, TRIG, CHOLHDL, LDLDIRECT in the last 72 hours. Thyroid Function Tests: No results for input(s): TSH, T4TOTAL, FREET4, T3FREE, THYROIDAB in the last 72 hours. Anemia Panel: No results for input(s): VITAMINB12, FOLATE, FERRITIN, TIBC, IRON, RETICCTPCT in the last 72 hours. Urine analysis:    Component Value Date/Time   COLORURINE YELLOW 01/02/2021 1014   APPEARANCEUR CLEAR 01/02/2021 1014   LABSPEC 1.018 01/02/2021 1014   PHURINE 5.0 01/02/2021 1014   GLUCOSEU 50 (A) 01/02/2021 1014   HGBUR  NEGATIVE 01/02/2021 1014   BILIRUBINUR NEGATIVE 01/02/2021 1014   KETONESUR 20 (A) 01/02/2021 1014   PROTEINUR NEGATIVE 01/02/2021 1014   NITRITE NEGATIVE 01/02/2021 1014   LEUKOCYTESUR LARGE (A) 01/02/2021 1014    Radiological Exams on Admission: CT ABDOMEN PELVIS W CONTRAST  Result Date: 01/02/2021 CLINICAL DATA:  Acute abdominal pain. Nonlocalized. History of breast cancer. EXAM: CT ABDOMEN AND PELVIS WITH CONTRAST TECHNIQUE: Multidetector CT imaging of the abdomen and pelvis was performed using the standard  protocol following bolus administration of intravenous contrast. CONTRAST:  139mL OMNIPAQUE IOHEXOL 300 MG/ML  SOLN COMPARISON:  None. FINDINGS: Lower chest: Lung bases are clear. Hepatobiliary: No focal hepatic lesion. No biliary duct dilatation. Common bile duct is normal. Pancreas: Pancreas is normal. No ductal dilatation. No pancreatic inflammation. Spleen: Normal spleen Adrenals/urinary tract: Adrenal glands and kidneys are normal. The ureters and bladder normal. Stomach/Bowel: Small hiatal hernia. Stomach and duodenum normal. The small bowel is fluid-filled. No mucosal enhancement or abnormal dilatation. Proximal small bowel measures up to 2.8 cm which is within normal limits. Distal small bowel is small caliber leading up to the terminal ileum. Small amount of free fluid in the RIGHT lower quadrant. Appendix is not identified. Ascending, transverse and descending colon normal. Descending colon relatively collapsed. Small amount fluid along the descending colon (image 34/2). Small fluid collection pelvis additionally. Vascular/Lymphatic: Abdominal aorta is normal caliber with atherosclerotic calcification. There is no retroperitoneal or periportal lymphadenopathy. No pelvic lymphadenopathy. Reproductive: Post hysterectomy.  Adnexa unremarkable Other: free fluid collects in the pelvis with simple attenuation. No organization Musculoskeletal: No aggressive osseous lesion. IMPRESSION: 1.  Prominent fluid-filled loops of small bowel aer upper limits of normal diameter. Small free fluid along the pericolic gutters and collecting in the pelvis. Findings are nonspecific could indicate enteritis. No evidence of high-grade obstruction. 2. Appendix not identified but no secondary signs of acute appendicitis. 3. Gallbladder normal. 4. Post hysterectomy. Electronically Signed   By: Suzy Bouchard M.D.   On: 01/02/2021 12:23   DG Chest Port 1 View  Result Date: 01/02/2021 CLINICAL DATA:  Weakness, abdominal pain.  Vomiting, fever. EXAM: PORTABLE CHEST 1 VIEW COMPARISON:  09/12/2018 FINDINGS: Right Port-A-Cath in place with the tip in the SVC. Left pacer is unchanged. Heart is normal size. Lungs clear. No effusions or edema. No acute bony abnormality. IMPRESSION: No active disease. Electronically Signed   By: Rolm Baptise M.D.   On: 01/02/2021 10:11   [facility] For contact please use secure messenger on Epic  If 7PM-7AM, please contact night-coverage located on [email or web address]   01/02/2021, 3:18 PM

## 2021-01-02 NOTE — ED Provider Notes (Signed)
[facility] [place] DEPT Provider Note   CSN: [id number] Arrival date & time: 01/02/21  9935     History Chief Complaint  Patient presents with  . Abdominal Pain    [name] is a 75 y.o. female.  HPI  Initial history obtained from patient chart reviewed and noted to have some memory loss I am unsure of the accuracy of this history, awaiting family at bedside 75 year old female history of memory loss, vulvar cancer, breast cancer, presents today complaining of abdominal pain.  She states the pain awoke her during the night last night and is in the lower abdomen.  She states she was recently same for a similar pain, but I can find no documentation of this.  She states the pain is in the lower abdomen and is severe.  She says that she was able to eat this morning but is now complaining of nausea.  She replies that she has not vomited up till now.  However, she vomits phlegm in the room.  She also endorses that she is having normal bowel movements and urine.  She denies fever or chills.     Past Medical History:  Diagnosis Date  . Bradycardia 06/2018  . Breast cancer ([place])   . Cancer [facility]) left    breast cancer 2005 surgery, chemo and radiation   . Complete heart block ([place])   . Diabetes mellitus   . Family history of prostate cancer   . Headache   . Hypercholesteremia   . Macular degeneration   . Osteoarthritis   . 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 Positive PPD    h/o, age 42  . Vitamin D deficiency   . Vulvar cancer ([place]) 2017 new dx    Patient Active Problem List   Diagnosis Date Noted  . Age-related cognitive decline 12/14/2020  . 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 Family history of prostate cancer   . Pancytopenia, acquired ([place]) 07/27/2020  . Thrombocytopenia ([place]) 07/13/2020  . Other constipation 07/06/2020  . Anemia due to antineoplastic chemotherapy 06/22/2020  . Surgical wound, non healing 06/15/2020  . Cancer associated  pain 06/15/2020  . Goals of care, counseling/discussion 06/07/2020  . Weight loss, abnormal 06/06/2020  . Complete heart block ([place]) 05/15/2020  . Pacemaker 05/15/2020  . Memory loss 09/03/2018  . Bradycardia 07/15/2018  . Vulval lesion 02/01/2016  . Vulvar cancer ([place]) 01/10/2016  . 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 DIABETES, TYPE 2 11/28/2008  . COUGH 11/28/2008  . NEOPLASM, MALIGNANT, BREAST, HX OF 11/28/2008    Past Surgical History:  Procedure Laterality Date  . ABDOMINAL HYSTERECTOMY     complete  . BREAST SURGERY Left 2006   left breast lumpectomy , chemo and radiation done  . COLONOSCOPY WITH PROPOFOL N/A 07/24/2015   Procedure: COLONOSCOPY WITH PROPOFOL;  Surgeon: Garlan Fair, MD;  [facility];  Service: Endoscopy;  Laterality: N/A;  . EYE SURGERY     removal of growth 35 years ago  . HEMORROIDECTOMY     38 years ago, no present symtoms  . IR IMAGING GUIDED PORT INSERTION  06/13/2020  . KNEE SURGERY Bilateral   . PACEMAKER IMPLANT N/A 07/16/2018   Procedure: PACEMAKER IMPLANT;  Surgeon: Evans Lance, MD;  Location: [place] CV LAB;  Service: Cardiovascular;  Laterality: N/A;  . RADICAL VULVECTOMY N/A 02/01/2016  Procedure: RADICAL VULVECTOMY;  Surgeon: [name], MD;  Location: [facility];  Service: Gynecology;  Laterality: N/A;     OB History   No obstetric history on file.     Family History  Problem Relation Age of Onset  . Dementia Mother   . Other Father        possible Dementia  . Prostate cancer Father   . HIV Brother   . Kidney failure Brother     Social History   Tobacco Use  . Smoking status: Never Smoker  . Smokeless tobacco: Never Used  Vaping Use  . Vaping Use: Never used  Substance Use Topics  . Alcohol use: Never  . Drug use: Never    Home Medications Prior to  Admission medications   Medication Sig Start Date End Date Taking? Authorizing Provider  Cyanocobalamin (VITAMIN B 12 PO) Take 1,000 mcg by mouth daily.    [provider]  donepezil (ARICEPT) 10 MG tablet Take 10 mg by mouth at bedtime.    [provider]  ergocalciferol (VITAMIN D2) 50000 UNITS capsule Take 50,000 Units by mouth every Tuesday.     [provider]  flecainide (TAMBOCOR) 50 MG tablet Take 1 tablet (50 mg total) by mouth 2 (two) times daily. 10/20/18   Evans Lance, MD  HYDROcodone-acetaminophen (NORCO/VICODIN) 5-325 MG tablet TAKE 1 TABLET BY MOUTH EVERY 6 HOURS AS NEEDED 07/27/20 01/23/21  Heath Lark, MD  lidocaine-prilocaine (EMLA) cream Apply to affected area once 06/15/20   Heath Lark, MD  metoprolol tartrate (LOPRESSOR) 25 MG tablet Take 1 tablet (25 mg total) by mouth 2 (two) times daily. 03/29/19   Evans Lance, MD  mirtazapine (REMERON) 15 MG tablet Take 1 tablet (15 mg total) by mouth at bedtime. 06/15/20   Heath Lark, MD  Omega-3 Fatty Acids (FISH OIL) 1200 MG CAPS Take 2 capsules by mouth every morning.    [provider]  ondansetron (ZOFRAN) 8 MG tablet Take 1 tablet (8 mg total) by mouth every 8 (eight) hours as needed. Start on the third day after cisplatin chemotherapy. 06/15/20   Heath Lark, MD  oxyCODONE-acetaminophen (PERCOCET/ROXICET) 5-325 MG tablet TAKE 1 TABLET BY MOUTH EVERY 4 HOURS AS NEEDED FOR SEVERE PAIN 08/01/20 01/28/21  Gery Pray, MD  prochlorperazine (COMPAZINE) 10 MG tablet Take 1 tablet (10 mg total) by mouth every 6 (six) hours as needed (Nausea or vomiting). 06/15/20   Heath Lark, MD  sertraline (ZOLOFT) 100 MG tablet Take 1 tablet (100 mg total) by mouth daily. 11/30/19   Melvenia Beam, MD    Allergies    Duloxetine  Review of Systems   Review of Systems  All other systems reviewed and are negative.   Physical Exam Updated Vital Signs BP (!) 186/86   Pulse (!) 59   Temp 97.6 F (36.4 C) (Oral)    Resp 20   SpO2 100%   Physical Exam Vitals and nursing note reviewed.  Constitutional:      General: She is in acute distress.     Appearance: She is ill-appearing.  HENT:     Head: Normocephalic.     Mouth/Throat:     Mouth: Mucous membranes are moist.  Eyes:     Extraocular Movements: Extraocular movements intact.  Cardiovascular:     Rate and Rhythm: Normal rate and regular rhythm.     Heart sounds: Normal heart sounds.  Pulmonary:     Effort: Pulmonary effort is normal.  Breath sounds: Normal breath sounds.  Abdominal:     General: Abdomen is flat. Bowel sounds are normal. There is distension.     Palpations: Abdomen is soft.     Tenderness: There is abdominal tenderness in the right lower quadrant and left lower quadrant. There is no guarding or rebound.  Skin:    General: Skin is warm.     Capillary Refill: Capillary refill takes less than 2 seconds.  Neurological:     Mental Status: She is alert.     ED Results / Procedures / Treatments   Labs (all labs ordered are listed, but only abnormal results are displayed) Labs Reviewed  RESP PANEL BY RT-PCR (FLU A&B, COVID) ARPGX2  CBC WITH DIFFERENTIAL/PLATELET  COMPREHENSIVE METABOLIC PANEL  URINALYSIS, ROUTINE W REFLEX MICROSCOPIC    EKG None  Radiology CT ABDOMEN PELVIS W CONTRAST  Result Date: 01/02/2021 CLINICAL DATA:  Acute abdominal pain. Nonlocalized. History of breast cancer. EXAM: CT ABDOMEN AND PELVIS WITH CONTRAST TECHNIQUE: Multidetector CT imaging of the abdomen and pelvis was performed using the standard protocol following bolus administration of intravenous contrast. CONTRAST:  152mL OMNIPAQUE IOHEXOL 300 MG/ML  SOLN COMPARISON:  None. FINDINGS: Lower chest: Lung bases are clear. Hepatobiliary: No focal hepatic lesion. No biliary duct dilatation. Common bile duct is normal. Pancreas: Pancreas is normal. No ductal dilatation. No pancreatic inflammation. Spleen: Normal spleen Adrenals/urinary tract:  Adrenal glands and kidneys are normal. The ureters and bladder normal. Stomach/Bowel: Small hiatal hernia. Stomach and duodenum normal. The small bowel is fluid-filled. No mucosal enhancement or abnormal dilatation. Proximal small bowel measures up to 2.8 cm which is within normal limits. Distal small bowel is small caliber leading up to the terminal ileum. Small amount of free fluid in the RIGHT lower quadrant. Appendix is not identified. Ascending, transverse and descending colon normal. Descending colon relatively collapsed. Small amount fluid along the descending colon (image 34/2). Small fluid collection pelvis additionally. Vascular/Lymphatic: Abdominal aorta is normal caliber with atherosclerotic calcification. There is no retroperitoneal or periportal lymphadenopathy. No pelvic lymphadenopathy. Reproductive: Post hysterectomy.  Adnexa unremarkable Other: free fluid collects in the pelvis with simple attenuation. No organization Musculoskeletal: No aggressive osseous lesion. IMPRESSION: 1. Prominent fluid-filled loops of small bowel aer upper limits of normal diameter. Small free fluid along the pericolic gutters and collecting in the pelvis. Findings are nonspecific could indicate enteritis. No evidence of high-grade obstruction. 2. Appendix not identified but no secondary signs of acute appendicitis. 3. Gallbladder normal. 4. Post hysterectomy. Electronically Signed   By: Suzy Bouchard M.D.   On: 01/02/2021 12:23   DG Chest Port 1 View  Result Date: 01/02/2021 CLINICAL DATA:  Weakness, abdominal pain.  Vomiting, fever. EXAM: PORTABLE CHEST 1 VIEW COMPARISON:  09/12/2018 FINDINGS: Right Port-A-Cath in place with the tip in the SVC. Left pacer is unchanged. Heart is normal size. Lungs clear. No effusions or edema. No acute bony abnormality. IMPRESSION: No active disease. Electronically Signed   By: Rolm Baptise M.D.   On: 01/02/2021 10:11    Procedures Procedures   Medications Ordered in  ED Medications  fentaNYL (SUBLIMAZE) injection 50 mcg (has no administration in time range)  ondansetron (ZOFRAN) injection 4 mg (has no administration in time range)    ED Course  I have reviewed the triage vital signs and the nursing notes.  Pertinent labs & imaging results that were available during my care of the patient were reviewed by me and considered in my medical decision making (see chart  for details).    MDM Rules/Calculators/A&P                         75 year old lady presents today with abdominal pain, nausea, and vomiting.  Evaluated here in the ED with CT. No evidence of acute abnormality.  Patient continued to have abdominal pain and surgery requested to evaluate.  Surgery evaluated and felt that her pain was very nonspecific and not a surgical etiology. Urine with large le and 11-20 wbc- being cultured and rocephin given.  Patient has continued to have vomiting.  She is hypokalemic.  Attempted oral fluid challenge with patient has vomited again.  Plan admission for further treatment and evaluation. Discussed with Dr.Lancaster who will see for admission Final Clinical Impression(s) / ED Diagnoses Final diagnoses:  Abdominal pain, unspecified abdominal location  Nausea and vomiting, intractability of vomiting not specified, unspecified vomiting type  Urinary tract infection without hematuria, site unspecified  Hypokalemia    Rx / DC Orders ED Discharge Orders    None       Pattricia Boss, MD 01/02/21 1515

## 2021-01-03 ENCOUNTER — Observation Stay (HOSPITAL_COMMUNITY): Payer: Medicare PPO

## 2021-01-03 DIAGNOSIS — K529 Noninfective gastroenteritis and colitis, unspecified: Secondary | ICD-10-CM | POA: Diagnosis not present

## 2021-01-03 DIAGNOSIS — R109 Unspecified abdominal pain: Secondary | ICD-10-CM

## 2021-01-03 DIAGNOSIS — E876 Hypokalemia: Secondary | ICD-10-CM

## 2021-01-03 DIAGNOSIS — K567 Ileus, unspecified: Secondary | ICD-10-CM | POA: Diagnosis not present

## 2021-01-03 DIAGNOSIS — R112 Nausea with vomiting, unspecified: Secondary | ICD-10-CM | POA: Diagnosis not present

## 2021-01-03 DIAGNOSIS — D72825 Bandemia: Secondary | ICD-10-CM

## 2021-01-03 DIAGNOSIS — F0390 Unspecified dementia without behavioral disturbance: Secondary | ICD-10-CM

## 2021-01-03 LAB — COMPREHENSIVE METABOLIC PANEL
ALT: 15 U/L (ref 0–44)
AST: 23 U/L (ref 15–41)
Albumin: 3.5 g/dL (ref 3.5–5.0)
Alkaline Phosphatase: 70 U/L (ref 38–126)
Anion gap: 12 (ref 5–15)
BUN: 32 mg/dL — ABNORMAL HIGH (ref 8–23)
CO2: 22 mmol/L (ref 22–32)
Calcium: 9.4 mg/dL (ref 8.9–10.3)
Chloride: 107 mmol/L (ref 98–111)
Creatinine, Ser: 1.19 mg/dL — ABNORMAL HIGH (ref 0.44–1.00)
GFR, Estimated: 47 mL/min — ABNORMAL LOW (ref >60)
Glucose, Bld: 216 mg/dL — ABNORMAL HIGH (ref 70–99)
Potassium: 4.4 mmol/L (ref 3.5–5.1)
Sodium: 141 mmol/L (ref 135–145)
Total Bilirubin: 0.5 mg/dL (ref 0.3–1.2)
Total Protein: 6.9 g/dL (ref 6.5–8.1)

## 2021-01-03 LAB — GLUCOSE, CAPILLARY
Glucose-Capillary: 188 mg/dL — ABNORMAL HIGH (ref 70–99)
Glucose-Capillary: 163 mg/dL — ABNORMAL HIGH (ref 70–99)

## 2021-01-03 LAB — CBC
HCT: 44.2 % (ref 36.0–46.0)
Hemoglobin: 14.4 g/dL (ref 12.0–15.0)
MCH: 29.6 pg (ref 26.0–34.0)
MCHC: 32.6 g/dL (ref 30.0–36.0)
MCV: 90.8 fL (ref 80.0–100.0)
Platelets: 200 10*3/uL (ref 150–400)
RBC: 4.87 MIL/uL (ref 3.87–5.11)
RDW: 13.1 % (ref 11.5–15.5)
WBC: 15.5 10*3/uL — ABNORMAL HIGH (ref 4.0–10.5)
nRBC: 0 % (ref 0.0–0.2)

## 2021-01-03 LAB — URINE CULTURE: Culture: NO GROWTH

## 2021-01-03 LAB — HEMOGLOBIN A1C
Hgb A1c MFr Bld: 6.1 % — ABNORMAL HIGH (ref 4.8–5.6)
Mean Plasma Glucose: 128.37 mg/dL

## 2021-01-03 MED ORDER — SERTRALINE HCL 100 MG PO TABS
100.0000 mg | ORAL_TABLET | Freq: Every day | ORAL | Status: DC
  Administered 2021-01-03 – 2021-01-04 (×2): 100 mg via ORAL
  Filled 2021-01-03 (×2): qty 1

## 2021-01-03 MED ORDER — DONEPEZIL HCL 10 MG PO TABS
10.0000 mg | ORAL_TABLET | Freq: Every day | ORAL | Status: DC
  Administered 2021-01-03: 10 mg via ORAL
  Filled 2021-01-03: qty 1

## 2021-01-03 MED ORDER — PSYLLIUM 95 % PO PACK: 1.0000 | PACK | Freq: Every day | ORAL | Status: DC

## 2021-01-03 MED ORDER — LACTATED RINGERS IV SOLN: Freq: Once | INTRAVENOUS | Status: AC

## 2021-01-03 MED ORDER — POLYETHYLENE GLYCOL 3350 17 G PO PACK
17.0000 g | PACK | Freq: Every day | ORAL | Status: DC
  Administered 2021-01-03 – 2021-01-04 (×2): 17 g via ORAL
  Filled 2021-01-03 (×2): qty 1

## 2021-01-03 MED ORDER — FLECAINIDE ACETATE 50 MG PO TABS
50.0000 mg | ORAL_TABLET | Freq: Two times a day (BID) | ORAL | Status: DC
  Administered 2021-01-03 – 2021-01-04 (×3): 50 mg via ORAL
  Filled 2021-01-03 (×3): qty 1

## 2021-01-03 MED ORDER — INSULIN ASPART 100 UNIT/ML ~~LOC~~ SOLN: 0.0000 [IU] | Freq: Every day | SUBCUTANEOUS | Status: DC

## 2021-01-03 MED ORDER — METOPROLOL TARTRATE 25 MG PO TABS
25.0000 mg | ORAL_TABLET | Freq: Two times a day (BID) | ORAL | Status: DC
  Administered 2021-01-03 – 2021-01-04 (×3): 25 mg via ORAL
  Filled 2021-01-03 (×3): qty 1

## 2021-01-03 MED ORDER — SODIUM CHLORIDE 0.9 % IV SOLN
3.0000 g | Freq: Two times a day (BID) | INTRAVENOUS | Status: DC
  Administered 2021-01-03 – 2021-01-04 (×2): 3 g via INTRAVENOUS
  Filled 2021-01-03 (×2): qty 3

## 2021-01-03 MED ORDER — INSULIN ASPART 100 UNIT/ML ~~LOC~~ SOLN
0.0000 [IU] | Freq: Three times a day (TID) | SUBCUTANEOUS | Status: DC
  Administered 2021-01-03: 2 [IU] via SUBCUTANEOUS
  Administered 2021-01-04 (×2): 3 [IU] via SUBCUTANEOUS
  Administered 2021-01-04 – 2021-01-06 (×3): 1 [IU] via SUBCUTANEOUS

## 2021-01-03 MED ORDER — SODIUM CHLORIDE 0.9 % IV SOLN
3.0000 g | Freq: Four times a day (QID) | INTRAVENOUS | Status: DC
  Filled 2021-01-03: qty 8

## 2021-01-03 MED ORDER — CHLORHEXIDINE GLUCONATE CLOTH 2 % EX PADS
6.0000 | MEDICATED_PAD | Freq: Every day | CUTANEOUS | Status: DC
  Administered 2021-01-03 – 2021-01-13 (×11): 6 via TOPICAL

## 2021-01-03 MED ORDER — LACTATED RINGERS IV SOLN: INTRAVENOUS | Status: AC

## 2021-01-03 NOTE — Evaluation (Signed)
Physical Therapy Evaluation Patient Details Name: [name] MRN: [id number] DOB: [birth year] Today's Date: 01/03/2021   History of Present Illness  Patient is a 75 year old female PMH complete heart block status post pacemaker placement, diet controlled diabetes, hyperlipidemia history of breast cancer and vulvar cancer, advanced vascular dementia admitted 4/12 with N/V and abdominal pain.   Clinical Impression  [name] is 76 y.o. female admitted with above HPI and diagnosis. Patient is currently limited by functional impairments below (see PT problem list). Patient lives with her husband and is independent for mobility at baseline. Patient currently is limited by abdominal discomfort and weakness. She required min assist/gaurd for transfers and to take small steps at EOB today and was limited by c/o abdominal discomfort and feeling unwell. Patient will benefit from continued skilled PT interventions to address impairments and progress independence with mobility, recommending HHPT follow up. Acute PT will follow and progress as able.     Follow Up Recommendations Home health PT    Equipment Recommendations  None recommended by PT    Recommendations for Other Services       Precautions / Restrictions Precautions Precautions: None Restrictions Weight Bearing Restrictions: No      Mobility  Bed Mobility Overal bed mobility: Needs Assistance Bed Mobility: Supine to Sit;Sit to Supine     Supine to sit: Supervision;HOB elevated Sit to supine: Supervision;HOB elevated   General bed mobility comments: pt takes extra time to bring bil LE on/off EOB. no assist needed. sueprvision for safety.    Transfers Overall transfer level: Needs assistance Equipment used: None Transfers: Sit to/from Stand Sit to Stand: Min guard;Min assist         General transfer comment: close guarding and min cues for pt using bil UE for power up. pt steady once  standing.  Ambulation/Gait Ambulation/Gait assistance: Min assist Gait Distance (Feet): 5 Feet Assistive device: None;1 person hand held assist Gait Pattern/deviations: Step-through pattern;Decreased step length - right;Decreased step length - left;Decreased stride length;Shuffle Gait velocity: decr   General Gait Details: pt taking several small steps at EOB forward/backward/laterally. HHA provided to steady and min cues/assist to step towards HOB. pt c/o of feeling unwell and requesting to return to bed.  Stairs            Wheelchair Mobility    Modified Rankin (Stroke Patients Only)       Balance Overall balance assessment: Needs assistance Sitting-balance support: Feet supported Sitting balance-Leahy Scale: Fair Sitting balance - Comments: pt able to slip shoes on with minimal weight shifting   Standing balance support: During functional activity;Single extremity supported;No upper extremity supported Standing balance-Leahy Scale: Fair Standing balance comment: pt usnteady without UE support for stepping                             Pertinent Vitals/Pain Pain Assessment: Faces Faces Pain Scale: Hurts little more Pain Location: abdomen Pain Descriptors / Indicators: Sore Pain Intervention(s): Limited activity within patient's tolerance;Monitored during session;Repositioned    Home Living Family/patient expects to be discharged to:: Private residence Living Arrangements: Spouse/significant other Available Help at Discharge: Family;Available 24 hours/day Type of Home: House Home Access: Stairs to enter     Home Layout: Multi-level Home Equipment: None Additional Comments: pt's spouse is home to help her and her family lives close by    Prior Function Level of Independence: Independent         Comments:  pt does not use AD for mobility at baseline. pt's husband on his way out of room but does not dispute this statement.     Hand Dominance    Dominant Hand:  (did not specify)    Extremity/Trunk Assessment   Upper Extremity Assessment Upper Extremity Assessment: Defer to OT evaluation    Lower Extremity Assessment Lower Extremity Assessment: Generalized weakness    Cervical / Trunk Assessment Cervical / Trunk Assessment: Normal  Communication   Communication: No difficulties  Cognition Arousal/Alertness: Awake/alert Behavior During Therapy: Flat affect Overall Cognitive Status: History of cognitive impairments - at baseline                                 General Comments: per chart patient with vascular dementia, follows directions appropriately, pt poor historian and oriented only to self.      General Comments      Exercises     Assessment/Plan    PT Assessment Patient needs continued PT services  PT Problem List Decreased strength;Decreased range of motion;Decreased activity tolerance;Decreased balance;Decreased mobility;Decreased knowledge of use of DME;Decreased knowledge of precautions;Decreased safety awareness;Decreased cognition       PT Treatment Interventions DME instruction;Gait training;Stair training;Functional mobility training;Balance training;Neuromuscular re-education;Cognitive remediation;Patient/family education;Therapeutic exercise;Therapeutic activities    PT Goals (Current goals can be found in the Care Plan section)  Acute Rehab PT Goals Patient Stated Goal: home PT Goal Formulation: With patient/family Time For Goal Achievement: 01/17/21 Potential to Achieve Goals: Good    Frequency Min 3X/week   Barriers to discharge        Co-evaluation               AM-PAC PT "6 Clicks" Mobility  Outcome Measure Help needed turning from your back to your side while in a flat bed without using bedrails?: None Help needed moving from lying on your back to sitting on the side of a flat bed without using bedrails?: A Little Help needed moving to and from a bed to a chair  (including a wheelchair)?: A Little Help needed standing up from a chair using your arms (e.g., wheelchair or bedside chair)?: A Little Help needed to walk in hospital room?: A Little Help needed climbing 3-5 steps with a railing? : A Lot 6 Click Score: 18    End of Session Equipment Utilized During Treatment: Gait belt Activity Tolerance: Patient tolerated treatment well Patient left: in bed;with call bell/phone within reach;with bed alarm set Nurse Communication: Mobility status PT Visit Diagnosis: Muscle weakness (generalized) (M62.81);Difficulty in walking, not elsewhere classified (R26.2)    Time: 1741-1800 PT Time Calculation (min) (ACUTE ONLY): 19 min   Charges:   PT Evaluation $PT Eval Low Complexity: 1 Low          Verner Mould, DPT Acute Rehabilitation Services Office [phone] Pager [phone]    Jacques Navy 01/03/2021, 6:22 PM

## 2021-01-03 NOTE — Progress Notes (Signed)
Patient continues to have bowel movements.  Follow up KUB today shows a nonobstructive bowel gas patter with air throughout the colon.  Her diet may be adv as tolerated.  No surgical indications.  This is likely enteritis type picture not SBO.  General surgery will sign off.  Henreitta Cea 10:06 AM 01/03/2021

## 2021-01-03 NOTE — Progress Notes (Signed)
Pharmacy Antibiotic Note  [name] is a 75 y.o. female admitted on 01/02/2021 with intractable nausea and vomiting.  Pharmacy has been consulted for Unasyn dosing.  Plan: Unasyn 3g IV q12h Follow up renal function & cultures as available  Height: 5' (152.4 cm) Weight: 54.4 kg (119 lb 14.9 oz) IBW/kg (Calculated) : 45.5  Temp (24hrs), Avg:98.6 F (37 C), Min:98.2 F (36.8 C), Max:99 F (37.2 C)  Recent Labs  Lab 01/02/21 0930 01/02/21 1520 01/03/21 0413  WBC 9.4 11.7* 15.5*  CREATININE 1.09* 1.12* 1.19*    Estimated Creatinine Clearance: 28.9 mL/min (A) (by C-G formula based on SCr of 1.19 mg/dL (H)).    Allergies  Allergen Reactions  . Duloxetine Itching    Cymbalta    Antimicrobials this admission: 4/12 CTX x 1 4/13 Unasyn >>  Dose adjustments this admission:  Microbiology results: 4/12 UCx: NGF  Thank you for allowing pharmacy to be a part of this patient's care.  Peggyann Juba, PharmD, BCPS Pharmacy: [phone] 01/03/2021 2:03 PM

## 2021-01-03 NOTE — Progress Notes (Signed)
PROGRESS NOTE  [name] AJO:[id number] DOB: [birth year]   PCP: Glenis Smoker, MD  Patient is from: Home  DOA: 01/02/2021 LOS: 0  Chief complaints: Nausea, vomiting and abdominal pain  Brief Narrative / Interim history: 75 year old F with PMH of dementia, CKD, CHB s/p PPM, DM-2, breast cancer and vulvar cancer presenting with nausea, vomiting and abdominal pain for over 24 hours after she had a seafood at a local restaurant.  She was admitted for profound hypokalemia, AKI and CT abdomen and pelvis concerning for enteritis.    Subjective: Seen and examined earlier this morning.  No major events overnight of this morning.  She reports abdominal pain but not able to rate her pain severity.  She localizes the pain across lower abdomen from right to left.  She denies nausea or vomiting.  She says she had loose and bloody bowel movement yesterday.  However, she is not a great historian.  She is awake but only oriented to self and "hospital" only.    Objective: Vitals:   01/02/21 2050 01/03/21 0050 01/03/21 0503 01/03/21 1012  BP: (!) 142/88 (!) 152/94 (!) 140/94 138/81  Pulse: (!) 105 (!) 101 (!) 109 (!) 105  Resp: 17 16 17 18   Temp: 98.8 F (37.1 C) 98.8 F (37.1 C) 98.6 F (37 C) 99 F (37.2 C)  TempSrc: Oral Oral Oral Oral  SpO2: 100% 100% 100% 100%  Weight:      Height:        Intake/Output Summary (Last 24 hours) at 01/03/2021 1438 Last data filed at 01/03/2021 1000 Gross per 24 hour  Intake 255.02 ml  Output 530 ml  Net -274.98 ml   Filed Weights   01/02/21 1717  Weight: 54.4 kg    Examination:  GENERAL: No apparent distress.  Nontoxic. HEENT: MMM.  Vision and hearing grossly intact.  NECK: Supple.  No apparent JVD.  RESP: On RA.  No IWOB.  Fair aeration bilaterally. CVS: Tachycardic to 100.Marland Kitchen Heart sounds normal.  ABD/GI/GU: BS+. Abd soft.  Tenderness across lower abdomen.  Seems to have some rebound MSK/EXT:  Moves extremities. No apparent  deformity. No edema.  SKIN: no apparent skin lesion or wound NEURO: Awake and alert.  Oriented to self and "hospital".  No apparent focal neuro deficit. PSYCH: Calm. Normal affect.  Procedures:  None  Microbiology summarized: [id number] and influenza PCR nonreactive. Urine culture NGTD. Blood culture pending  Assessment & Plan: Intractable nausea, vomiting and abdominal pain likely due to possible enteritis as noted on CT abdomen and pelvis.  Nausea and vomiting seems to have resolved.  Still with abdominal pain and significant tenderness with rebound.  UA with pyuria but urine culture negative.  Leukocytosis worse. -Continue IV fluid hydration -Start IV Unasyn per pharmacy -Appreciate input by surgery.  Unlikely SBO.  Surgery signed off. 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  Improved. Recent Labs    06/21/20 1423 06/28/20 0915 07/05/20 1428 07/12/20 1428 07/19/20 1409 07/26/20 1359 10/11/20 1246 12/14/20 1117 01/02/21 0930 01/02/21 1520 01/03/21 0413  BUN 13 14 16 15 15 15  25* 31* 31*  --  32*  CREATININE 0.83 0.78 0.81 0.95 0.90 1.00 1.00 1.30* 1.09* 1.12* 1.19*  -Hold nephrotoxic meds -IV fluid as above -Recheck in the morning  Hypokalemia: Likely due to GI loss on Maxzide.  Resolved.  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  Oriented to self and "[place]". -Reorientation and delirium precautions  CHB s/p PPM-slightly tachycardic. -Follow EKG -Resume home flecainide and metoprolol  History of breast cancer-seems to be remission. History of vulvar cancer-seems to be remission. -Outpatient follow-up  Body mass index is 23.42 kg/m.         DVT prophylaxis:  enoxaparin  (LOVENOX) injection 40 mg Start: 01/02/21 2200  Code Status: Full code Family Communication: Updated patient's son over the phone. Level of care: Med-Surg Status is: Observation  The patient will require care spanning > 2 midnights and should be moved to inpatient because: Ongoing active pain requiring inpatient pain management, IV treatments appropriate due to intensity of illness or inability to take PO and Inpatient level of care appropriate due to severity of illness  Dispo: The patient is from: Home              Anticipated d/c is to: Home              Patient currently is not medically stable to d/c.   Difficult to place patient No       Consultants:  None   Sch Meds:  Scheduled Meds: . donepezil  10 mg Oral QHS  . enoxaparin (LOVENOX) injection  40 mg Subcutaneous Q24H  . flecainide  50 mg Oral BID  . insulin aspart  0-5 Units Subcutaneous QHS  . insulin aspart  0-9 Units Subcutaneous TID WC  . metoprolol tartrate  25 mg Oral BID  . polyethylene glycol  17 g Oral Daily  . sertraline  100 mg Oral Daily   Continuous Infusions: . ampicillin-sulbactam (UNASYN) IV    . lactated ringers     PRN Meds:.acetaminophen, acetaminophen, ondansetron **OR** ondansetron (ZOFRAN) IV  Antimicrobials: Anti-infectives (From admission, onward)   Start     Dose/Rate Route Frequency Ordered Stop   01/03/21 1600  Ampicillin-Sulbactam (UNASYN) 3 g in sodium chloride 0.9 % 100 mL IVPB  Status:  Discontinued        3 g 200 mL/hr over 30 Minutes Intravenous Every 6 hours 01/03/21 1400 01/03/21 1405   01/03/21 1600  Ampicillin-Sulbactam (UNASYN) 3 g in sodium chloride 0.9 % 100 mL IVPB        3 g 200 mL/hr over 30 Minutes Intravenous Every 12 hours 01/03/21 1405     01/02/21 1230  cefTRIAXone (ROCEPHIN) 1 g in sodium chloride 0.9 % 100 mL IVPB        1 g 200 mL/hr over 30 Minutes Intravenous  Once 01/02/21 1227 01/02/21 1315       I have personally reviewed the following labs and  images: CBC: Recent Labs  Lab 01/02/21 0930 01/02/21 1520 01/03/21 0413  WBC 9.4 11.7* 15.5*  NEUTROABS 8.1*  --   --   HGB 10.6* 11.6* 14.4  HCT 32.6* 35.1* 44.2  MCV 90.1 90.9 90.8  PLT 171 168 200   BMP &GFR Recent Labs  Lab 01/02/21 0930 01/02/21 1520 01/03/21 0413  NA 140  --  141  K 2.9*  --  4.4  CL 109  --  107  CO2 17*  --  22  GLUCOSE 260*  --  216*  BUN 31*  --  32*  CREATININE 1.09* 1.12* 1.19*  CALCIUM 9.8  --  9.4   Estimated Creatinine Clearance: 28.9 mL/min (A) (by C-G formula based on SCr of 1.19 mg/dL (  H)). Liver & Pancreas: Recent Labs  Lab 01/02/21 0930 01/03/21 0413  AST 27 23  ALT 16 15  ALKPHOS 78 70  BILITOT 0.6 0.5  PROT 7.4 6.9  ALBUMIN 4.0 3.5   No results for input(s): LIPASE, AMYLASE in the last 168 hours. No results for input(s): AMMONIA in the last 168 hours. Diabetic: No results for input(s): HGBA1C in the last 72 hours. No results for input(s): GLUCAP in the last 168 hours. Cardiac Enzymes: No results for input(s): CKTOTAL, CKMB, CKMBINDEX, TROPONINI in the last 168 hours. No results for input(s): PROBNP in the last 8760 hours. Coagulation Profile: No results for input(s): INR, PROTIME in the last 168 hours. Thyroid Function Tests: No results for input(s): TSH, T4TOTAL, FREET4, T3FREE, THYROIDAB in the last 72 hours. Lipid Profile: No results for input(s): CHOL, HDL, LDLCALC, TRIG, CHOLHDL, LDLDIRECT in the last 72 hours. Anemia Panel: No results for input(s): VITAMINB12, FOLATE, FERRITIN, TIBC, IRON, RETICCTPCT in the last 72 hours. Urine analysis:    Component Value Date/Time   COLORURINE YELLOW 01/02/2021 1014   APPEARANCEUR CLEAR 01/02/2021 1014   LABSPEC 1.018 01/02/2021 1014   PHURINE 5.0 01/02/2021 1014   GLUCOSEU 50 (A) 01/02/2021 1014   HGBUR NEGATIVE 01/02/2021 1014   BILIRUBINUR NEGATIVE 01/02/2021 1014   KETONESUR 20 (A) 01/02/2021 1014   PROTEINUR NEGATIVE 01/02/2021 1014   NITRITE NEGATIVE 01/02/2021  1014   LEUKOCYTESUR LARGE (A) 01/02/2021 1014   Sepsis Labs: Invalid input(s): PROCALCITONIN, [place]  Microbiology: Recent Results (from the past 240 hour(s))  Urine Culture     Status: None   Collection Time: 01/02/21 10:14 AM   Specimen: Urine, Catheterized  Result Value Ref Range Status   Specimen Description   Final    URINE, CATHETERIZED Performed at [facility] [address] [place], [place] [address]    Special Requests   Final    NONE Performed at [facility], [place] [address], [place], [place] [address]    Culture   Final    NO GROWTH Performed at [facility], [place] [address], [place],  [address]    Report Status 01/03/2021 FINAL  Final  Resp Panel by RT-PCR (Flu A&B, Covid) Nasopharyngeal Swab     Status: None   Collection Time: 01/02/21 10:15 AM   Specimen: Nasopharyngeal Swab; Nasopharyngeal(NP) swabs in vial transport medium  Result Value Ref Range Status   SARS Coronavirus 2 by RT PCR NEGATIVE NEGATIVE Final    Comment: (NOTE) SARS-CoV-2 target nucleic acids are NOT DETECTED.  The SARS-CoV-2 RNA is generally detectable in upper respiratory specimens during the acute phase of infection. The lowest concentration of SARS-CoV-2 viral copies this assay can detect is 138 copies/mL. A negative result does not preclude SARS-Cov-2 infection and should not be used as the sole basis for treatment or other patient management decisions. A negative result may occur with  improper specimen collection/handling, submission of specimen other than nasopharyngeal swab, presence of viral mutation(s) within the areas targeted by this assay, and inadequate number of viral copies(<138 copies/mL). A negative result must be combined with clinical observations, patient history, and epidemiological information. The expected result is Negative.  Fact Sheet for Patients:  EntrepreneurPulse.com.au  Fact Sheet  for Healthcare Providers:  IncredibleEmployment.be  This test is no t yet approved or cleared by the [place] FDA and  has been authorized for detection and/or diagnosis of SARS-CoV-2 by FDA under an Emergency Use Authorization (EUA). This EUA will  remain  in effect (meaning this test can be used) for the duration of the COVID-19 declaration under Section 564(b)(1) of the Act, 21 U.S.C.section 360bbb-3(b)(1), unless the authorization is terminated  or revoked sooner.       Influenza A by PCR NEGATIVE NEGATIVE Final   Influenza B by PCR NEGATIVE NEGATIVE Final    Comment: (NOTE) The Xpert Xpress SARS-CoV-2/FLU/RSV plus assay is intended as an aid in the diagnosis of influenza from Nasopharyngeal swab specimens and should not be used as a sole basis for treatment. Nasal washings and aspirates are unacceptable for Xpert Xpress SARS-CoV-2/FLU/RSV testing.  Fact Sheet for Patients: EntrepreneurPulse.com.au  Fact Sheet for Healthcare Providers: IncredibleEmployment.be  This test is not yet approved or cleared by the [place] FDA and has been authorized for detection and/or diagnosis of SARS-CoV-2 by FDA under an Emergency Use Authorization (EUA). This EUA will remain in effect (meaning this test can be used) for the duration of the COVID-19 declaration under Section 564(b)(1) of the Act, 21 U.S.C. section 360bbb-3(b)(1), unless the authorization is terminated or revoked.  Performed at [facility], [place] [address], [place],  [address]     Radiology Studies: DG Abd Portable 1V  Result Date: 01/03/2021 CLINICAL DATA:  Ileus EXAM: PORTABLE ABDOMEN - 1 VIEW COMPARISON:  CT 01/02/2021 FINDINGS: Air is seen within nondilated loops of large and small bowel within the abdomen. No evidence of obstruction. Excreted contrast is present within the urinary bladder. No radio-opaque calculi or other  significant radiographic abnormality are seen. IMPRESSION: Nonobstructive bowel gas pattern. Electronically Signed   By: Davina Poke D.O.   On: 01/03/2021 09:51    Kaileen Bronkema T. [place]  If 7PM-7AM, please contact night-coverage [email or web address] 01/03/2021, 2:38 PM

## 2021-01-03 NOTE — Evaluation (Signed)
Occupational Therapy Evaluation Patient Details Name: [name] MRN: [id number] DOB: 09/22/1946 Today's Date: 01/03/2021    History of Present Illness Patient is a 75 year old female PMH complete heart block status post pacemaker placement, diet controlled diabetes, hyperlipidemia history of breast cancer and vulvar cancer, advanced vascular dementia admitted 4/12 with N/V and abdominal pain   Clinical Impression   Patient lives at home with spouse in a multi-level home with 8 internal steps to get to living area/bed and bathroom. At baseline patient fully independent with ADLs, does not use any adaptive devices. Spouse reports he is home all the time, and can start bringing her with him when he does go out on errands. Patient/family report she is a little weaker than her baseline due to illness however demonstrates independence with all self care tasks and functional mobility assessed. Educate patient/family to encourage out of bed activity as tolerated once at home to rebuild endurance. No further acute OT needs at this time, will sign off. Please re-consult if new needs arise.    Follow Up Recommendations  No OT follow up    Equipment Recommendations  None recommended by OT       Precautions / Restrictions Precautions Precautions: None Restrictions Weight Bearing Restrictions: No      Mobility Bed Mobility Overal bed mobility: Independent                  Transfers Overall transfer level: Independent                    Balance Overall balance assessment: No apparent balance deficits (not formally assessed)                                         ADL either performed or assessed with clinical judgement   ADL Overall ADL's : Independent                                       General ADL Comments: patient dons shoes, ambulates to bathroom, performs toilet transfer and ambulates in room without any physical assistance or  loss of balance                  Pertinent Vitals/Pain Pain Assessment: Faces Faces Pain Scale: Hurts little more Pain Location: abdomen Pain Descriptors / Indicators: Sore Pain Intervention(s): Monitored during session;Premedicated before session     Hand Dominance  (did not specify)   Extremity/Trunk Assessment Upper Extremity Assessment Upper Extremity Assessment: Overall WFL for tasks assessed   Lower Extremity Assessment Lower Extremity Assessment: Defer to PT evaluation   Cervical / Trunk Assessment Cervical / Trunk Assessment: Normal   Communication Communication Communication: No difficulties   Cognition Arousal/Alertness: Awake/alert Behavior During Therapy: Flat affect Overall Cognitive Status: History of cognitive impairments - at baseline                                 General Comments: per chart patient with vascular dementia, follows directions appropriately              Home Living Family/patient expects to be discharged to:: Private residence Living Arrangements: Spouse/significant other Available Help at Discharge: Family;Available 24 hours/day Type of Home: House  Home Layout: Multi-level Alternate Level Stairs-Number of Steps: 8             Home Equipment: None          Prior Functioning/Environment Level of Independence: Independent                 OT Problem List: Pain         OT Goals(Current goals can be found in the care plan section) Acute Rehab OT Goals Patient Stated Goal: home OT Goal Formulation: All assessment and education complete, DC therapy   AM-PAC OT "6 Clicks" Daily Activity     Outcome Measure Help from another person eating meals?: None Help from another person taking care of personal grooming?: None Help from another person toileting, which includes using toliet, bedpan, or urinal?: None Help from another person bathing (including washing, rinsing, drying)?: None Help from  another person to put on and taking off regular upper body clothing?: None Help from another person to put on and taking off regular lower body clothing?: None 6 Click Score: 24   End of Session Nurse Communication: Mobility status  Activity Tolerance: Patient tolerated treatment well Patient left: in bed;with call bell/phone within reach;with family/visitor present  OT Visit Diagnosis: Pain Pain - part of body:  (abdomen)                Time: 1416-1430 OT Time Calculation (min): 14 min Charges:  OT General Charges $OT Visit: 1 Visit OT Evaluation $OT Eval Low Complexity: 1 Low  Delbert Phenix OT OT pager: [phone]  Rosemary Holms 01/03/2021, 2:38 PM

## 2021-01-04 ENCOUNTER — Observation Stay (HOSPITAL_COMMUNITY): Payer: Medicare PPO

## 2021-01-04 DIAGNOSIS — K56609 Unspecified intestinal obstruction, unspecified as to partial versus complete obstruction: Secondary | ICD-10-CM | POA: Diagnosis present

## 2021-01-04 DIAGNOSIS — R1030 Lower abdominal pain, unspecified: Secondary | ICD-10-CM

## 2021-01-04 DIAGNOSIS — Z8679 Personal history of other diseases of the circulatory system: Secondary | ICD-10-CM

## 2021-01-04 DIAGNOSIS — Z7189 Other specified counseling: Secondary | ICD-10-CM | POA: Diagnosis not present

## 2021-01-04 DIAGNOSIS — R54 Age-related physical debility: Secondary | ICD-10-CM | POA: Diagnosis present

## 2021-01-04 DIAGNOSIS — E1122 Type 2 diabetes mellitus with diabetic chronic kidney disease: Secondary | ICD-10-CM | POA: Diagnosis present

## 2021-01-04 DIAGNOSIS — E785 Hyperlipidemia, unspecified: Secondary | ICD-10-CM | POA: Diagnosis present

## 2021-01-04 DIAGNOSIS — E876 Hypokalemia: Secondary | ICD-10-CM | POA: Diagnosis present

## 2021-01-04 DIAGNOSIS — Z515 Encounter for palliative care: Secondary | ICD-10-CM | POA: Diagnosis not present

## 2021-01-04 DIAGNOSIS — E7800 Pure hypercholesterolemia, unspecified: Secondary | ICD-10-CM | POA: Diagnosis present

## 2021-01-04 DIAGNOSIS — I442 Atrioventricular block, complete: Secondary | ICD-10-CM | POA: Diagnosis present

## 2021-01-04 DIAGNOSIS — E440 Moderate protein-calorie malnutrition: Secondary | ICD-10-CM | POA: Diagnosis present

## 2021-01-04 DIAGNOSIS — N179 Acute kidney failure, unspecified: Secondary | ICD-10-CM

## 2021-01-04 DIAGNOSIS — N390 Urinary tract infection, site not specified: Secondary | ICD-10-CM | POA: Diagnosis present

## 2021-01-04 DIAGNOSIS — K529 Noninfective gastroenteritis and colitis, unspecified: Secondary | ICD-10-CM | POA: Diagnosis present

## 2021-01-04 DIAGNOSIS — G3109 Other frontotemporal dementia: Secondary | ICD-10-CM

## 2021-01-04 DIAGNOSIS — R7989 Other specified abnormal findings of blood chemistry: Secondary | ICD-10-CM

## 2021-01-04 DIAGNOSIS — K565 Intestinal adhesions [bands], unspecified as to partial versus complete obstruction: Secondary | ICD-10-CM | POA: Diagnosis present

## 2021-01-04 DIAGNOSIS — R112 Nausea with vomiting, unspecified: Secondary | ICD-10-CM | POA: Diagnosis not present

## 2021-01-04 DIAGNOSIS — E860 Dehydration: Secondary | ICD-10-CM | POA: Diagnosis present

## 2021-01-04 DIAGNOSIS — F0280 Dementia in other diseases classified elsewhere without behavioral disturbance: Secondary | ICD-10-CM

## 2021-01-04 DIAGNOSIS — H3530 Unspecified macular degeneration: Secondary | ICD-10-CM | POA: Diagnosis present

## 2021-01-04 DIAGNOSIS — K9189 Other postprocedural complications and disorders of digestive system: Secondary | ICD-10-CM | POA: Diagnosis present

## 2021-01-04 DIAGNOSIS — R109 Unspecified abdominal pain: Secondary | ICD-10-CM | POA: Diagnosis not present

## 2021-01-04 DIAGNOSIS — I714 Abdominal aortic aneurysm, without rupture: Secondary | ICD-10-CM | POA: Diagnosis present

## 2021-01-04 DIAGNOSIS — F0150 Vascular dementia without behavioral disturbance: Secondary | ICD-10-CM | POA: Diagnosis present

## 2021-01-04 DIAGNOSIS — D649 Anemia, unspecified: Secondary | ICD-10-CM | POA: Diagnosis present

## 2021-01-04 DIAGNOSIS — N1831 Chronic kidney disease, stage 3a: Secondary | ICD-10-CM | POA: Diagnosis present

## 2021-01-04 DIAGNOSIS — E1165 Type 2 diabetes mellitus with hyperglycemia: Secondary | ICD-10-CM | POA: Diagnosis present

## 2021-01-04 DIAGNOSIS — Z20822 Contact with and (suspected) exposure to covid-19: Secondary | ICD-10-CM | POA: Diagnosis present

## 2021-01-04 DIAGNOSIS — E870 Hyperosmolality and hypernatremia: Secondary | ICD-10-CM | POA: Diagnosis not present

## 2021-01-04 DIAGNOSIS — D696 Thrombocytopenia, unspecified: Secondary | ICD-10-CM | POA: Diagnosis present

## 2021-01-04 LAB — CBC WITH DIFFERENTIAL/PLATELET
Abs Immature Granulocytes: 0.14 10*3/uL — ABNORMAL HIGH (ref 0.00–0.07)
Basophils Absolute: 0 10*3/uL (ref 0.0–0.1)
Basophils Relative: 0 %
Eosinophils Absolute: 0 10*3/uL (ref 0.0–0.5)
Eosinophils Relative: 0 %
HCT: 38.5 % (ref 36.0–46.0)
Hemoglobin: 12.6 g/dL (ref 12.0–15.0)
Immature Granulocytes: 1 %
Lymphocytes Relative: 8 %
Lymphs Abs: 1.7 10*3/uL (ref 0.7–4.0)
MCH: 29.4 pg (ref 26.0–34.0)
MCHC: 32.7 g/dL (ref 30.0–36.0)
MCV: 90 fL (ref 80.0–100.0)
Monocytes Absolute: 1.3 10*3/uL — ABNORMAL HIGH (ref 0.1–1.0)
Monocytes Relative: 6 %
Neutro Abs: 17.7 10*3/uL — ABNORMAL HIGH (ref 1.7–7.7)
Neutrophils Relative %: 85 %
Platelets: 197 10*3/uL (ref 150–400)
RBC: 4.28 MIL/uL (ref 3.87–5.11)
RDW: 13.2 % (ref 11.5–15.5)
WBC: 20.8 10*3/uL — ABNORMAL HIGH (ref 4.0–10.5)
nRBC: 0 % (ref 0.0–0.2)

## 2021-01-04 LAB — COMPREHENSIVE METABOLIC PANEL
ALT: 13 U/L (ref 0–44)
AST: 20 U/L (ref 15–41)
Albumin: 3.4 g/dL — ABNORMAL LOW (ref 3.5–5.0)
Alkaline Phosphatase: 62 U/L (ref 38–126)
Anion gap: 9 (ref 5–15)
BUN: 34 mg/dL — ABNORMAL HIGH (ref 8–23)
CO2: 29 mmol/L (ref 22–32)
Calcium: 9.7 mg/dL (ref 8.9–10.3)
Chloride: 106 mmol/L (ref 98–111)
Creatinine, Ser: 1.11 mg/dL — ABNORMAL HIGH (ref 0.44–1.00)
GFR, Estimated: 52 mL/min — ABNORMAL LOW (ref >60)
Glucose, Bld: 191 mg/dL — ABNORMAL HIGH (ref 70–99)
Potassium: 3.8 mmol/L (ref 3.5–5.1)
Sodium: 144 mmol/L (ref 135–145)
Total Bilirubin: 0.9 mg/dL (ref 0.3–1.2)
Total Protein: 6.5 g/dL (ref 6.5–8.1)

## 2021-01-04 LAB — MAGNESIUM: Magnesium: 2 mg/dL (ref 1.7–2.4)

## 2021-01-04 LAB — PREALBUMIN: Prealbumin: 16.3 mg/dL — ABNORMAL LOW (ref 18–38)

## 2021-01-04 LAB — GLUCOSE, CAPILLARY
Glucose-Capillary: 202 mg/dL — ABNORMAL HIGH (ref 70–99)
Glucose-Capillary: 158 mg/dL — ABNORMAL HIGH (ref 70–99)
Glucose-Capillary: 144 mg/dL — ABNORMAL HIGH (ref 70–99)
Glucose-Capillary: 119 mg/dL — ABNORMAL HIGH (ref 70–99)

## 2021-01-04 LAB — HEMOGLOBIN A1C
Hgb A1c MFr Bld: 6.3 % — ABNORMAL HIGH (ref 4.8–5.6)
Mean Plasma Glucose: 134.11 mg/dL

## 2021-01-04 LAB — LACTIC ACID, PLASMA: Lactic Acid, Venous: 1.5 mmol/L (ref 0.5–1.9)

## 2021-01-04 LAB — PHOSPHORUS: Phosphorus: 2.8 mg/dL (ref 2.5–4.6)

## 2021-01-04 MED ORDER — PANTOPRAZOLE SODIUM 40 MG IV SOLR
40.0000 mg | INTRAVENOUS | Status: DC
  Administered 2021-01-04 – 2021-01-12 (×9): 40 mg via INTRAVENOUS
  Filled 2021-01-04 (×9): qty 40

## 2021-01-04 MED ORDER — SODIUM CHLORIDE 0.9 % IV SOLN
3.0000 g | Freq: Four times a day (QID) | INTRAVENOUS | Status: DC
  Administered 2021-01-04 – 2021-01-08 (×16): 3 g via INTRAVENOUS
  Filled 2021-01-04 (×4): qty 3
  Filled 2021-01-04 (×2): qty 8
  Filled 2021-01-04 (×7): qty 3
  Filled 2021-01-04: qty 8
  Filled 2021-01-04 (×3): qty 3

## 2021-01-04 MED ORDER — METOPROLOL TARTRATE 5 MG/5ML IV SOLN
2.5000 mg | Freq: Four times a day (QID) | INTRAVENOUS | Status: DC
  Administered 2021-01-04 – 2021-01-11 (×27): 2.5 mg via INTRAVENOUS
  Filled 2021-01-04 (×35): qty 5

## 2021-01-04 MED ORDER — LACTATED RINGERS IV SOLN: INTRAVENOUS | Status: DC

## 2021-01-04 NOTE — Progress Notes (Signed)
RN unsuccessful at placing NGT. I came to pt's BS and attempted to place NGT and pt refused.  Asked me to get out of her room. I d/w her that this would help her abd distention and she refused.  If she con't with n/v would recomment reattempt or have it placed under fluro

## 2021-01-04 NOTE — Progress Notes (Signed)
[facility] Progress Note     Subjective: CC:  Called back by [facility] due to nausea, vomiting, and KUB consistent with worsening SBO.   Pt reports nausea a vomiting overnight, per her husband she was vomiting small amounts of brown bile. She denies flatus. She had a nonbloody liquid stool early this AM. Denies significant abdominal pain.  Pt afebrile, VSS WBC up to 20  Objective: Vital signs in last 24 hours: Temp:  [98 F (36.7 C)-98.9 F (37.2 C)] 98.6 F (37 C) (04/14 0429) Pulse Rate:  [87-110] 87 (04/14 0429) Resp:  [16-18] 16 (04/14 0429) BP: (134-148)/(80-92) 148/92 (04/14 0429) SpO2:  [100 %] 100 % (04/14 0429) Last BM Date: 01/02/21  Intake/Output from previous day: 04/13 0701 - 04/14 0700 In: 1179.6 [P.O.:240; I.V.:739.6; IV Piggyback:200] Out: 630 [Urine:600; Emesis/NG output:30] Intake/Output this shift: Total I/O In: 399.9 [I.V.:399.9] Out: -   PE: Gen:  Alert, NAD, pleasant Card:  Regular rate and rhythm, pedal pulses 2+ BL Pulm:  Normal effort, clear to auscultation bilaterally Abd: Soft, non-tender, mild distention, no rebound tenderness or guarding, no HSM Skin: warm and dry, no rashes  Psych: A&Ox3   Lab Results:  Recent Labs    01/03/21 0413 01/04/21 0425  WBC 15.5* 20.8*  HGB 14.4 12.6  HCT 44.2 38.5  PLT 200 197   BMET Recent Labs    01/03/21 0413 01/04/21 0425  NA 141 144  K 4.4 3.8  CL 107 106  CO2 22 29  GLUCOSE 216* 191*  BUN 32* 34*  CREATININE 1.19* 1.11*  CALCIUM 9.4 9.7   PT/INR No results for input(s): LABPROT, INR in the last 72 hours. CMP     Component Value Date/Time   NA 144 01/04/2021 0425   NA 139 09/01/2018 1550   NA 138 11/22/2013 1447   K 3.8 01/04/2021 0425   K 4.2 11/22/2013 1447   CL 106 01/04/2021 0425   CL 104 11/03/2012 1419   CO2 29 01/04/2021 0425   CO2 28 11/22/2013 1447   GLUCOSE 191 (H) 01/04/2021 0425   GLUCOSE 319 (H) 11/22/2013 1447   GLUCOSE 119 (H) 11/03/2012 1419   BUN 34  (H) 01/04/2021 0425   BUN 13 09/01/2018 1550   BUN 11.5 11/22/2013 1447   CREATININE 1.11 (H) 01/04/2021 0425   CREATININE 1.30 (H) 12/14/2020 1117   CREATININE 0.8 11/22/2013 1447   CALCIUM 9.7 01/04/2021 0425   CALCIUM 10.0 11/22/2013 1447   PROT 6.5 01/04/2021 0425   PROT 6.9 11/22/2013 1447   ALBUMIN 3.4 (L) 01/04/2021 0425   ALBUMIN 3.5 11/22/2013 1447   AST 20 01/04/2021 0425   AST 11 11/22/2013 1447   ALT 13 01/04/2021 0425   ALT 13 11/22/2013 1447   ALKPHOS 62 01/04/2021 0425   ALKPHOS 94 11/22/2013 1447   BILITOT 0.9 01/04/2021 0425   BILITOT 0.20 11/22/2013 1447   GFRNONAA 52 (L) 01/04/2021 0425   GFRNONAA 43 (L) 12/14/2020 1117   GFRAA >60 06/21/2020 1423   Lipase     Component Value Date/Time   LIPASE 15 07/17/2010 1628       Studies/Results: DG Abd Portable 1V  Result Date: 01/04/2021 CLINICAL DATA:  Nausea, vomiting. EXAM: PORTABLE ABDOMEN - 1 VIEW COMPARISON:  January 03, 2021. FINDINGS: Dilated small bowel loops are noted concerning for distal small bowel obstruction. No colonic dilatation is noted. Surgical clips are noted overlying both hip regions. IMPRESSION: Dilated small bowel loops are noted concerning for distal small  bowel obstruction. Electronically Signed   By: Marijo Conception M.D.   On: 01/04/2021 09:37   DG Abd Portable 1V  Result Date: 01/03/2021 CLINICAL DATA:  Ileus EXAM: PORTABLE ABDOMEN - 1 VIEW COMPARISON:  CT 01/02/2021 FINDINGS: Air is seen within nondilated loops of large and small bowel within the abdomen. No evidence of obstruction. Excreted contrast is present within the urinary bladder. No radio-opaque calculi or other significant radiographic abnormality are seen. IMPRESSION: Nonobstructive bowel gas pattern. Electronically Signed   By: Davina Poke D.O.   On: 01/03/2021 09:51    Anti-infectives: Anti-infectives (From admission, onward)   Start     Dose/Rate Route Frequency Ordered Stop   01/04/21 1200  Ampicillin-Sulbactam  (UNASYN) 3 g in sodium chloride 0.9 % 100 mL IVPB        3 g 200 mL/hr over 30 Minutes Intravenous Every 6 hours 01/04/21 0823     01/03/21 1600  Ampicillin-Sulbactam (UNASYN) 3 g in sodium chloride 0.9 % 100 mL IVPB  Status:  Discontinued        3 g 200 mL/hr over 30 Minutes Intravenous Every 6 hours 01/03/21 1400 01/03/21 1405   01/03/21 1600  Ampicillin-Sulbactam (UNASYN) 3 g in sodium chloride 0.9 % 100 mL IVPB  Status:  Discontinued        3 g 200 mL/hr over 30 Minutes Intravenous Every 12 hours 01/03/21 1405 01/04/21 0823   01/02/21 1230  cefTRIAXone (ROCEPHIN) 1 g in sodium chloride 0.9 % 100 mL IVPB        1 g 200 mL/hr over 30 Minutes Intravenous  Once 01/02/21 1227 01/02/21 1315     Assessment/Plan AKI on CKD Hypokalemia DM2  HLD Vascular dementia  CHB s/p PPM PMH breast cancer PMH vulvar cancer   Leukocytosis- afebrile; WBC 20. Unknown etiology, Urine Cx 4/12 NGTD, Blood Cx 4/13 NGTD  Reactive ileus vs pSBO  - PMH abdominal hysterectomy unknown year  - initial CT scan 4/12 w/ mildly dilated loops of small bowel, possible enteritis, no high grade obstruction  - repeat KUB today with significantly dilated loops of bowel concerning for possible distal SBO - clinically patient has signs of pSBO vs ileus with no flatus, some liquid stool, nausea, and vomiting.  recommend NG tube placement for decompression. Her abdominal exam is relatively benign- non-tender, soft. Her vitals are stable, WBC is 20. Low suspicion for intestinal ischemia but will add on a lactate to be sure. May need repeat CT abd/pelvis but will discuss with MD first.   LOS: 0 days    Obie Dredge, [facility] Please see Amion for pager number during day hours 7:00am-4:30pm

## 2021-01-04 NOTE — Progress Notes (Signed)
Attempted to place NG tube x 2. Patient unable to follow commands. MD notified.

## 2021-01-04 NOTE — Progress Notes (Signed)
Inpatient Diabetes Program Recommendations  AACE/ADA: New Consensus Statement on Inpatient Glycemic Control (2015)  Target Ranges:  Prepandial:   less than 140 mg/dL      Peak postprandial:   less than 180 mg/dL (1-2 hours)      Critically ill patients:  140 - 180 mg/dL   Lab Results  Component Value Date   GLUCAP 202 (H) 01/04/2021   HGBA1C 6.3 (H) 01/04/2021    Review of Glycemic Control Results for MURPHY, BUNDICK (MRN [id number]) as of 01/04/2021 10:00  Ref. Range 01/03/2021 16:33 01/03/2021 20:56 01/04/2021 07:19  Glucose-Capillary Latest Ref Range: 70 - 99 mg/dL 188 (H) 163 (H) 202 (H)   Diabetes history: DM 2 Outpatient Diabetes medications: diet controlled Current orders for Inpatient glycemic control:  Novolog 0-9 units tid + hs  A1c 6.1 on 4/13 BUN/Creat: 34/1.11  Inpatient Diabetes Program Recommendations:    -  Increase Novolog to 0-15 units tid + hs  Thanks,  Tama Headings RN, MSN, BC-ADM Inpatient Diabetes Coordinator Team Pager [phone] (8a-5p)

## 2021-01-04 NOTE — Progress Notes (Signed)
PROGRESS NOTE  [name] HDQ:[id number] DOB: 1945-10-25   PCP: Glenis Smoker, MD  Patient is from: Home  DOA: 01/02/2021 LOS: 0  Chief complaints: Nausea, vomiting and abdominal pain  Brief Narrative / Interim history: 75 year old F with PMH of dementia, CKD, CHB s/p PPM, DM-2, breast cancer and vulvar cancer presenting with nausea, vomiting and abdominal pain for over 24 hours after she had a seafood at a local restaurant.  She was admitted for profound hypokalemia, AKI and CT abdomen and pelvis concerning for enteritis.   Patient continued to have significant low abdominal pain.  Abdominal x-ray on 4/14 showed SBO.  General surgery consulted.  Started on SBO protocol with NG tube.   Subjective: Seen and examined earlier this morning.  Continues to endorse significant abdominal pain across lower abdomen.  He is not able to describe the pain but rates it as severe.  She thinks she had a bowel movement this morning although husband does not think this is the case.  She denies chest pain or shortness of breath.  She is not a great historian.  She is only oriented to self, family and partial place.  Objective: Vitals:   01/03/21 1012 01/03/21 1400 01/03/21 2054 01/04/21 0429  BP: 138/81 135/80 134/89 (!) 148/92  Pulse: (!) 105 99 (!) 110 87  Resp: 18 18 16 16   Temp: 99 F (37.2 C) 98.9 F (37.2 C) 98 F (36.7 C) 98.6 F (37 C)  TempSrc: Oral Oral Oral Oral  SpO2: 100% 100% 100% 100%  Weight:      Height:        Intake/Output Summary (Last 24 hours) at 01/04/2021 1531 Last data filed at 01/04/2021 1354 Gross per 24 hour  Intake 1727.81 ml  Output 400 ml  Net 1327.81 ml   Filed Weights   01/02/21 1717  Weight: 54.4 kg    Examination: GENERAL: Frail looking elderly female.  Appears to be in pain. HEENT: MMM.  Vision and hearing grossly intact.  NECK: Supple.  No apparent JVD.  RESP: On RA.  No IWOB.  Fair aeration bilaterally. CVS:  RRR. Heart sounds  normal.  ABD/GI/GU: BS+.  Abdomen is slightly distended.  TTP across lower abdomen.  No rebound or guarding. MSK/EXT:  Moves extremities. No apparent deformity. No edema.  SKIN: no apparent skin lesion or wound NEURO: Awake but not quite alert.  Oriented to self, family and "hospital".  No apparent focal neuro deficit. PSYCH: Appears to be in pain.   Procedures:  None  Microbiology summarized: [id number] and influenza PCR nonreactive. Urine culture NGTD. Blood culture pending  Assessment & Plan: Small bowel obstruction-patient had persistent abdominal pain.  X-ray shows small bowel obstruction.  -General surgery consulted and recommended NG tube placement for bowel decompression -IV fluid for hydration -Analgesics and antiemetics.  Intractable nausea, vomiting and abdominal pain: Initial CT showed enteritis but no SBO.  She now has SBO.  Unclear if this was missed on CT or evolving at a time. UA with pyuria but urine culture and blood cultures negative.  She is afebrile. Leukocytosis worse. 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  Improved. Recent Labs    06/28/20 0915 07/05/20 1428 07/12/20 1428 07/19/20 1409 07/26/20 1359 10/11/20 1246 12/14/20 1117 01/02/21 0930 01/02/21 1520 01/03/21 0413 01/04/21 0425  BUN 14 16 15 15  15  25* 31* 31*  --  32* 34*  CREATININE 0.78 0.81 0.95 0.90 1.00 1.00 1.30* 1.09* 1.12* 1.19* 1.11*  -Hold nephrotoxic meds -IV fluid as above -Recheck in the morning  Hypokalemia: Likely due to GI loss on Maxzide.  Resolved.  Diet-controlled diabetes with hyperglycemia and hyperlipidemia: A1c 6.3%. 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  Oriented to self, family and partial place. -Reorientation and delirium precautions  CHB s/p PPM-slightly tachycardic. -IV metoprolol while n.p.o. for SBO.  History of breast cancer-seems to be remission. History of vulvar cancer-seems to be remission. -Outpatient follow-up  Goal of care: Patient seems to have advanced dementia.  Now with SBO and possible enteritis.  She looks frail.  Discussed CODE STATUS with patient's husband and son atbedside who prefers to continue full code. -Consult palliative care  Body mass index is 23.42 kg/m.         DVT prophylaxis:  enoxaparin (LOVENOX) injection 40 mg Start: 01/02/21 2200  Code Status: Full code Family Communication: Updated patient's son and husband at bedside Level of care: Med-Surg Status is: Observation  The patient will require care spanning > 2 midnights and should be moved to inpatient because: Ongoing active pain requiring inpatient pain management, Ongoing diagnostic testing needed not appropriate for outpatient work up, IV treatments appropriate due to intensity of illness or inability to take PO and Inpatient level of care appropriate due to severity of illness  Dispo: The patient is from: Home              Anticipated d/c is to: Home              Patient currently is not medically stable to d/c.   Difficult to place patient No       Consultants:  General surgery   Sch Meds:  Scheduled Meds: . Chlorhexidine Gluconate Cloth  6 each Topical Daily  . enoxaparin (LOVENOX) injection  40 mg Subcutaneous Q24H  . insulin aspart  0-5 Units Subcutaneous QHS  . insulin aspart  0-9 Units Subcutaneous TID WC  . metoprolol tartrate  2.5 mg Intravenous Q6H  . pantoprazole (PROTONIX) IV  40 mg Intravenous Q24H   Continuous Infusions: . ampicillin-sulbactam (UNASYN) IV 3 g (01/04/21 1156)  . lactated ringers 100 mL/hr at 01/04/21 1249   PRN Meds:.acetaminophen, acetaminophen, ondansetron **OR** ondansetron  (ZOFRAN) IV  Antimicrobials: Anti-infectives (From admission, onward)   Start     Dose/Rate Route Frequency Ordered Stop   01/04/21 1200  Ampicillin-Sulbactam (UNASYN) 3 g in sodium chloride 0.9 % 100 mL IVPB        3 g 200 mL/hr over 30 Minutes Intravenous Every 6 hours 01/04/21 0823     01/03/21 1600  Ampicillin-Sulbactam (UNASYN) 3 g in sodium chloride 0.9 % 100 mL IVPB  Status:  Discontinued        3 g 200 mL/hr over 30 Minutes Intravenous Every 6 hours 01/03/21 1400 01/03/21 1405   01/03/21 1600  Ampicillin-Sulbactam (UNASYN) 3 g in sodium chloride 0.9 % 100 mL IVPB  Status:  Discontinued        3 g 200 mL/hr over 30 Minutes Intravenous Every 12 hours 01/03/21 1405 01/04/21 0823   01/02/21 1230  cefTRIAXone (ROCEPHIN) 1 g in sodium chloride 0.9 % 100 mL IVPB        1 g  200 mL/hr over 30 Minutes Intravenous  Once 01/02/21 1227 01/02/21 1315       I have personally reviewed the following labs and images: CBC: Recent Labs  Lab 01/02/21 0930 01/02/21 1520 01/03/21 0413 01/04/21 0425  WBC 9.4 11.7* 15.5* 20.8*  NEUTROABS 8.1*  --   --  17.7*  HGB 10.6* 11.6* 14.4 12.6  HCT 32.6* 35.1* 44.2 38.5  MCV 90.1 90.9 90.8 90.0  PLT 171 168 200 197   BMP &GFR Recent Labs  Lab 01/02/21 0930 01/02/21 1520 01/03/21 0413 01/04/21 0425  NA 140  --  141 144  K 2.9*  --  4.4 3.8  CL 109  --  107 106  CO2 17*  --  22 29  GLUCOSE 260*  --  216* 191*  BUN 31*  --  32* 34*  CREATININE 1.09* 1.12* 1.19* 1.11*  CALCIUM 9.8  --  9.4 9.7  MG  --   --   --  2.0  PHOS  --   --   --  2.8   Estimated Creatinine Clearance: 31 mL/min (A) (by C-G formula based on SCr of 1.11 mg/dL (H)). Liver & Pancreas: Recent Labs  Lab 01/02/21 0930 01/03/21 0413 01/04/21 0425  AST 27 23 20   ALT 16 15 13   ALKPHOS 78 70 62  BILITOT 0.6 0.5 0.9  PROT 7.4 6.9 6.5  ALBUMIN 4.0 3.5 3.4*   No results for input(s): LIPASE, AMYLASE in the last 168 hours. No results for input(s): AMMONIA in the last  168 hours. Diabetic: Recent Labs    01/03/21 1532 01/04/21 0425  HGBA1C 6.1* 6.3*   Recent Labs  Lab 01/03/21 1633 01/03/21 2056 01/04/21 0719 01/04/21 1123  GLUCAP 188* 163* 202* 158*   Cardiac Enzymes: No results for input(s): CKTOTAL, CKMB, CKMBINDEX, TROPONINI in the last 168 hours. No results for input(s): PROBNP in the last 8760 hours. Coagulation Profile: No results for input(s): INR, PROTIME in the last 168 hours. Thyroid Function Tests: No results for input(s): TSH, T4TOTAL, FREET4, T3FREE, THYROIDAB in the last 72 hours. Lipid Profile: No results for input(s): CHOL, HDL, LDLCALC, TRIG, CHOLHDL, LDLDIRECT in the last 72 hours. Anemia Panel: No results for input(s): VITAMINB12, FOLATE, FERRITIN, TIBC, IRON, RETICCTPCT in the last 72 hours. Urine analysis:    Component Value Date/Time   COLORURINE YELLOW 01/02/2021 1014   APPEARANCEUR CLEAR 01/02/2021 1014   LABSPEC 1.018 01/02/2021 1014   PHURINE 5.0 01/02/2021 1014   GLUCOSEU 50 (A) 01/02/2021 1014   HGBUR NEGATIVE 01/02/2021 1014   BILIRUBINUR NEGATIVE 01/02/2021 1014   KETONESUR 20 (A) 01/02/2021 1014   PROTEINUR NEGATIVE 01/02/2021 1014   NITRITE NEGATIVE 01/02/2021 1014   LEUKOCYTESUR LARGE (A) 01/02/2021 1014   Sepsis Labs: Invalid input(s): PROCALCITONIN, [place]  Microbiology: Recent Results (from the past 240 hour(s))  Urine Culture     Status: None   Collection Time: 01/02/21 10:14 AM   Specimen: Urine, Catheterized  Result Value Ref Range Status   Specimen Description   Final    URINE, CATHETERIZED Performed at [facility] [address], [place], [place] [address]    Special Requests   Final    NONE Performed at [facility], [place] [address] [place], [place] [address]    Culture   Final    NO GROWTH Performed at [facility], [place] [address] [place],  [address]    Report Status 01/03/2021 FINAL  Final  Resp Panel  by RT-PCR (Flu  A&B, Covid) Nasopharyngeal Swab     Status: None   Collection Time: 01/02/21 10:15 AM   Specimen: Nasopharyngeal Swab; Nasopharyngeal(NP) swabs in vial transport medium  Result Value Ref Range Status   SARS Coronavirus 2 by RT PCR NEGATIVE NEGATIVE Final    Comment: (NOTE) SARS-CoV-2 target nucleic acids are NOT DETECTED.  The SARS-CoV-2 RNA is generally detectable in upper respiratory specimens during the acute phase of infection. The lowest concentration of SARS-CoV-2 viral copies this assay can detect is 138 copies/mL. A negative result does not preclude SARS-Cov-2 infection and should not be used as the sole basis for treatment or other patient management decisions. A negative result may occur with  improper specimen collection/handling, submission of specimen other than nasopharyngeal swab, presence of viral mutation(s) within the areas targeted by this assay, and inadequate number of viral copies(<138 copies/mL). A negative result must be combined with clinical observations, patient history, and epidemiological information. The expected result is Negative.  Fact Sheet for Patients:  EntrepreneurPulse.com.au  Fact Sheet for Healthcare Providers:  IncredibleEmployment.be  This test is no t yet approved or cleared by the [place] FDA and  has been authorized for detection and/or diagnosis of SARS-CoV-2 by FDA under an Emergency Use Authorization (EUA). This EUA will remain  in effect (meaning this test can be used) for the duration of the COVID-19 declaration under Section 564(b)(1) of the Act, 21 U.S.C.section 360bbb-3(b)(1), unless the authorization is terminated  or revoked sooner.       Influenza A by PCR NEGATIVE NEGATIVE Final   Influenza B by PCR NEGATIVE NEGATIVE Final    Comment: (NOTE) The Xpert Xpress SARS-CoV-2/FLU/RSV plus assay is intended as an aid in the diagnosis of influenza from Nasopharyngeal swab specimens  and should not be used as a sole basis for treatment. Nasal washings and aspirates are unacceptable for Xpert Xpress SARS-CoV-2/FLU/RSV testing.  Fact Sheet for Patients: EntrepreneurPulse.com.au  Fact Sheet for Healthcare Providers: IncredibleEmployment.be  This test is not yet approved or cleared by the [place] FDA and has been authorized for detection and/or diagnosis of SARS-CoV-2 by FDA under an Emergency Use Authorization (EUA). This EUA will remain in effect (meaning this test can be used) for the duration of the COVID-19 declaration under Section 564(b)(1) of the Act, 21 U.S.C. section 360bbb-3(b)(1), unless the authorization is terminated or revoked.  Performed at [facility], [place] [address] [place], [place] [address]   Culture, blood (routine x 2)     Status: None (Preliminary result)   Collection Time: 01/03/21  3:32 PM   Specimen: BLOOD  Result Value Ref Range Status   Specimen Description   Final    BLOOD RIGHT ANTECUBITAL Performed at [facility] [address], [place], [place] [address]    Special Requests   Final    BOTTLES DRAWN AEROBIC ONLY Blood Culture adequate volume Performed at [facility] [address] [place], [place] [address]    Culture   Final    NO GROWTH < 12 HOURS Performed at [facility] [address] [place],  [address]    Report Status PENDING  Incomplete  Culture, blood (routine x 2)     Status: None (Preliminary result)   Collection Time: 01/03/21  3:32 PM   Specimen: BLOOD LEFT HAND  Result Value Ref Range Status   Specimen Description   Final    BLOOD LEFT HAND Performed at [facility], [place] [address], [place], [place] [address]    Special Requests   Final    BOTTLES DRAWN AEROBIC ONLY Blood Culture adequate volume Performed at [facility] [address],  [place], [place] [address]    Culture   Final    NO GROWTH < 12 HOURS Performed at [facility] [address], [place], [place] [address]    Report Status PENDING  Incomplete    Radiology Studies: DG Abd Portable 1V  Result Date: 01/04/2021 CLINICAL DATA:  Nausea, vomiting. EXAM: PORTABLE ABDOMEN - 1 VIEW COMPARISON:  January 03, 2021. FINDINGS: Dilated small bowel loops are noted concerning for distal small bowel obstruction. No colonic dilatation is noted. Surgical clips are noted overlying both hip regions. IMPRESSION: Dilated small bowel loops are noted concerning for distal small bowel obstruction. Electronically Signed   By: Marijo Conception M.D.   On: 01/04/2021 09:37    Jace Dowe T. Benson  If 7PM-7AM, please contact night-coverage [email or web address] 01/04/2021, 3:31 PM

## 2021-01-04 NOTE — Progress Notes (Signed)
Pharmacy Antibiotic Note  [name] is a 75 y.o. female admitted on 01/02/2021 with intractable nausea and vomiting.  Pharmacy consulted for Unasyn dosing.  Today, serum creatinine down to 1.11 and estimated creatinine clearance greater than 30 ml/min.  WBC elevated and trended up to 20.8.   Plan: Increase Unasyn to 3g IV q6h Follow up renal function & cultures as available  Height: 5' (152.4 cm) Weight: 54.4 kg (119 lb 14.9 oz) IBW/kg (Calculated) : 45.5  Temp (24hrs), Avg:98.6 F (37 C), Min:98 F (36.7 C), Max:99 F (37.2 C)  Recent Labs  Lab 01/02/21 0930 01/02/21 1520 01/03/21 0413 01/04/21 0425  WBC 9.4 11.7* 15.5* 20.8*  CREATININE 1.09* 1.12* 1.19* 1.11*    Estimated Creatinine Clearance: 31 mL/min (A) (by C-G formula based on SCr of 1.11 mg/dL (H)).    Allergies  Allergen Reactions  . Duloxetine Itching    Cymbalta    Antimicrobials this admission: 4/12 CTX x 1 4/13 Unasyn >>  Dose adjustments this admission: 4/14 Increased Unasyn from q12h to q6h  Microbiology results: 4/12 UCx: NGF  Thank you for allowing pharmacy to be a part of this patient's care.  Sandor Arboleda P. Legrand Como, PharmD, [place] Please utilize Amion for appropriate phone number to reach the unit pharmacist ([place]) 01/04/2021 8:26 AM

## 2021-01-04 NOTE — Progress Notes (Signed)
Physical Therapy Treatment Patient Details Name: [name] MRN: [id number] DOB: 20-Sep-1946 Today's Date: 01/04/2021    History of Present Illness Patient is a 75 year old female PMH complete heart block status post pacemaker placement, diet controlled diabetes, hyperlipidemia history of breast cancer and vulvar cancer, advanced vascular dementia admitted 4/12 with N/V and abdominal pain    PT Comments    Patient continues to c/o upset stomach and unwell feeling but agreeable to walk with her son. Pt progressed gait today and ambulated ~ 30' with HHA to steady. Patient drifted Rt/Lt throughout due to weakness and impaired balance; educated son on use of gait belt to assist pt while ambulating. Pt took short standing rest break during gait. EOS repositioned in chair with warm blankets to help with abdominal discomfort. Patient overall takes extra time to initiate and complete mobility but is progressing. She has excellent support at home from husband and son and will still benefit from HHPT follow up. Acute will progress pt as able.   Follow Up Recommendations  Home health PT     Equipment Recommendations  None recommended by PT    Recommendations for Other Services       Precautions / Restrictions Precautions Precautions: Fall Restrictions Weight Bearing Restrictions: No    Mobility  Bed Mobility Overal bed mobility: Needs Assistance Bed Mobility: Supine to Sit     Supine to sit: Supervision;HOB elevated     General bed mobility comments: moderate cues to initiate supine>sit transfer, and sequence. min guard for safety with mobility, no physical assist needed. pt taking extra time and using bed rail.    Transfers Overall transfer level: Needs assistance Equipment used: None Transfers: Sit to/from Stand Sit to Stand: Min guard         General transfer comment: close guard for safety, pt taking increased time to initiate and bil UE used for sit<>stand from EOB  and recliner.  Ambulation/Gait Ambulation/Gait assistance: Min assist Gait Distance (Feet): 80 Feet Assistive device: 1 person hand held assist Gait Pattern/deviations: Step-through pattern;Decreased step length - right;Decreased step length - left;Decreased stride length;Shuffle;Drifts right/left Gait velocity: decr   General Gait Details: HHA to steady and pt drifting Rt/Lt requiring min assist to prevent LOB throughout. pt stopped at window at end of hall and rested while talking to son for ~1 minute. no seated rest required.   Stairs             Wheelchair Mobility    Modified Rankin (Stroke Patients Only)       Balance Overall balance assessment: Needs assistance Sitting-balance support: Feet supported Sitting balance-Leahy Scale: Fair     Standing balance support: During functional activity;Single extremity supported;No upper extremity supported Standing balance-Leahy Scale: Fair Standing balance comment: pt usnteady without UE support for stepping                            Cognition Arousal/Alertness: Awake/alert Behavior During Therapy: Flat affect Overall Cognitive Status: History of cognitive impairments - at baseline                                 General Comments: per chart patient with vascular dementia, follows directions appropriately, pt poor historian and oriented only to self.      Exercises      General Comments        Pertinent Vitals/Pain Pain  Assessment: Faces Faces Pain Scale: Hurts little more Pain Location: abdomen Pain Descriptors / Indicators: Sore Pain Intervention(s): Limited activity within patient's tolerance;Monitored during session;Repositioned    Home Living                      Prior Function            PT Goals (current goals can now be found in the care plan section) Acute Rehab PT Goals Patient Stated Goal: home PT Goal Formulation: With patient/family Time For Goal  Achievement: 01/17/21 Potential to Achieve Goals: Good Progress towards PT goals: Progressing toward goals    Frequency    Min 3X/week      PT Plan Current plan remains appropriate    Co-evaluation              AM-PAC PT "6 Clicks" Mobility   Outcome Measure  Help needed turning from your back to your side while in a flat bed without using bedrails?: None Help needed moving from lying on your back to sitting on the side of a flat bed without using bedrails?: A Little Help needed moving to and from a bed to a chair (including a wheelchair)?: A Little Help needed standing up from a chair using your arms (e.g., wheelchair or bedside chair)?: A Little Help needed to walk in hospital room?: A Little Help needed climbing 3-5 steps with a railing? : A Lot 6 Click Score: 18    End of Session Equipment Utilized During Treatment: Gait belt Activity Tolerance: Patient tolerated treatment well Patient left: in bed;with call bell/phone within reach;with bed alarm set Nurse Communication: Mobility status PT Visit Diagnosis: Muscle weakness (generalized) (M62.81);Difficulty in walking, not elsewhere classified (R26.2)     Time: 0981-1914 PT Time Calculation (min) (ACUTE ONLY): 32 min  Charges:  $Gait Training: 8-22 mins $Therapeutic Activity: 8-22 mins                     Verner Mould, DPT Acute Rehabilitation Services Office [phone] Pager [phone]     Jacques Navy 01/04/2021, 12:23 PM

## 2021-01-05 ENCOUNTER — Inpatient Hospital Stay (HOSPITAL_COMMUNITY): Payer: Medicare PPO

## 2021-01-05 LAB — COMPREHENSIVE METABOLIC PANEL
ALT: 13 U/L (ref 0–44)
AST: 17 U/L (ref 15–41)
Albumin: 3.1 g/dL — ABNORMAL LOW (ref 3.5–5.0)
Alkaline Phosphatase: 50 U/L (ref 38–126)
Anion gap: 9 (ref 5–15)
BUN: 38 mg/dL — ABNORMAL HIGH (ref 8–23)
CO2: 26 mmol/L (ref 22–32)
Calcium: 9.1 mg/dL (ref 8.9–10.3)
Chloride: 108 mmol/L (ref 98–111)
Creatinine, Ser: 1.09 mg/dL — ABNORMAL HIGH (ref 0.44–1.00)
GFR, Estimated: 53 mL/min — ABNORMAL LOW (ref >60)
Glucose, Bld: 135 mg/dL — ABNORMAL HIGH (ref 70–99)
Potassium: 3.6 mmol/L (ref 3.5–5.1)
Sodium: 143 mmol/L (ref 135–145)
Total Bilirubin: 0.8 mg/dL (ref 0.3–1.2)
Total Protein: 5.9 g/dL — ABNORMAL LOW (ref 6.5–8.1)

## 2021-01-05 LAB — CBC WITH DIFFERENTIAL/PLATELET
Abs Immature Granulocytes: 0.09 10*3/uL — ABNORMAL HIGH (ref 0.00–0.07)
Basophils Absolute: 0 10*3/uL (ref 0.0–0.1)
Basophils Relative: 0 %
Eosinophils Absolute: 0 10*3/uL (ref 0.0–0.5)
Eosinophils Relative: 0 %
HCT: 33.8 % — ABNORMAL LOW (ref 36.0–46.0)
Hemoglobin: 10.9 g/dL — ABNORMAL LOW (ref 12.0–15.0)
Immature Granulocytes: 1 %
Lymphocytes Relative: 11 %
Lymphs Abs: 1.5 10*3/uL (ref 0.7–4.0)
MCH: 29.6 pg (ref 26.0–34.0)
MCHC: 32.2 g/dL (ref 30.0–36.0)
MCV: 91.8 fL (ref 80.0–100.0)
Monocytes Absolute: 0.9 10*3/uL (ref 0.1–1.0)
Monocytes Relative: 6 %
Neutro Abs: 11.1 10*3/uL — ABNORMAL HIGH (ref 1.7–7.7)
Neutrophils Relative %: 82 %
Platelets: 156 10*3/uL (ref 150–400)
RBC: 3.68 MIL/uL — ABNORMAL LOW (ref 3.87–5.11)
RDW: 13.3 % (ref 11.5–15.5)
WBC: 13.6 10*3/uL — ABNORMAL HIGH (ref 4.0–10.5)
nRBC: 0 % (ref 0.0–0.2)

## 2021-01-05 LAB — MAGNESIUM: Magnesium: 2 mg/dL (ref 1.7–2.4)

## 2021-01-05 LAB — GLUCOSE, CAPILLARY
Glucose-Capillary: 133 mg/dL — ABNORMAL HIGH (ref 70–99)
Glucose-Capillary: 101 mg/dL — ABNORMAL HIGH (ref 70–99)
Glucose-Capillary: 115 mg/dL — ABNORMAL HIGH (ref 70–99)
Glucose-Capillary: 133 mg/dL — ABNORMAL HIGH (ref 70–99)

## 2021-01-05 LAB — LACTIC ACID, PLASMA: Lactic Acid, Venous: 1 mmol/L (ref 0.5–1.9)

## 2021-01-05 MED ORDER — LIP MEDEX EX OINT
TOPICAL_OINTMENT | CUTANEOUS | Status: AC
  Filled 2021-01-05: qty 7

## 2021-01-05 MED ORDER — DIATRIZOATE MEGLUMINE & SODIUM 66-10 % PO SOLN
90.0000 mL | Freq: Once | ORAL | Status: AC
  Administered 2021-01-05: 90 mL via NASOGASTRIC
  Filled 2021-01-05: qty 90

## 2021-01-05 NOTE — Progress Notes (Signed)
PROGRESS NOTE  [name]:[id number] DOB: 1946-05-02   PCP: Glenis Smoker, MD  Patient is from: Home  DOA: 01/02/2021 LOS: 1  Chief complaints: Nausea, vomiting and abdominal pain  Brief Narrative / Interim history: 75 year old F with PMH of dementia, CKD, CHB s/p PPM, DM-2, breast cancer and vulvar cancer presenting with nausea, vomiting and abdominal pain for over 24 hours after she had a seafood at a local restaurant.  She was admitted for profound hypokalemia, AKI and CT abdomen and pelvis concerning for enteritis.   Patient continued to have significant low abdominal pain.  Abdominal x-ray on 4/14 showed SBO.  General surgery consulted.  Started on SBO protocol with NG tube.   Subjective: Seen and examined earlier this morning.  Husband at bedside.  He reports small-volume emesis but denies blood in emesis.  Also intermittent abdominal pain.  Nursing staff was able to get NG tube placed this morning.  Objective: Vitals:   01/04/21 2004 01/04/21 2321 01/05/21 0604 01/05/21 1352  BP: (!) 145/71 (!) 136/57 (!) 159/78 (!) 149/69  Pulse: 67 79 74 71  Resp: 16  14 17   Temp:   98.9 F (37.2 C) 98.3 F (36.8 C)  TempSrc:   Axillary   SpO2: 99% 98% 98%   Weight:      Height:        Intake/Output Summary (Last 24 hours) at 01/05/2021 1437 Last data filed at 01/05/2021 1352 Gross per 24 hour  Intake 1809.67 ml  Output 250 ml  Net 1559.67 ml   Filed Weights   01/02/21 1717  Weight: 54.4 kg    Examination: GENERAL: No apparent distress.  Nontoxic. HEENT: MMM.  Vision and hearing grossly intact.  NECK: Supple.  No apparent JVD.  RESP: On RA.  No IWOB.  Fair aeration bilaterally. CVS:  RRR. Heart sounds normal.  ABD/GI/GU: BS+. Abd soft, NTND.  MSK/EXT:  Moves extremities. No apparent deformity. No edema.  SKIN: no apparent skin lesion or wound NEURO: Awake and alert.  Oriented to self, place and family.  No apparent focal neuro deficit. PSYCH: Calm.  Normal affect.   Procedures:  None  Microbiology summarized: [id number]-05 and influenza PCR nonreactive. Urine culture NGTD. Blood culture pending  Assessment & Plan: Small bowel obstruction-patient had persistent abdominal pain.  X-ray shows small bowel obstruction.  -General managing conservatively with NG tube -IV fluid for hydration, analgesics and antiemetics -N.p.o. except ice chips  Intractable nausea, vomiting and abdominal pain: Initial CT showed enteritis but no SBO.  She now has SBO.  Unclear if this was missed on CT or evolving at a time. UA with pyuria but urine culture and blood cultures negative.  She is afebrile. Leukocytosis improving. 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  Improved. Recent Labs    07/05/20 1428 07/12/20 1428 07/19/20 1409 07/26/20 1359 10/11/20 1246 12/14/20 1117 01/02/21 0930 01/02/21 1520 01/03/21 0413 01/04/21 0425 01/05/21 0457  BUN 16 15 15 15  25* 31* 31*  --  32* 34* 38*  CREATININE 0.81 0.95 0.90 1.00 1.00 1.30* 1.09* 1.12* 1.19* 1.11* 1.09*  -Hold nephrotoxic meds -IV fluid as above -Recheck in the morning  Normocytic anemia: Hgb at baseline. Recent Labs    07/12/20 1428 07/19/20 1409 07/26/20 1359 10/11/20 1246 12/14/20 1039 01/02/21 0930 01/02/21 1520 01/03/21 0413 01/04/21 0425 01/05/21 0457  HGB 9.8* 9.5* 9.0* 8.5* 10.3* 10.6* 11.6* 14.4 12.6 10.9*  -Continue monitoring  Diet-controlled diabetes with hyperglycemia and hyperlipidemia: A1c 6.3%. 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  Oriented to self, family and partial place. -Reorientation and delirium  precautions  Hypokalemia: Likely due to GI loss on Maxzide.  Resolved.  CHB s/p PPM-slightly tachycardic. -IV metoprolol while n.p.o. for SBO.  History of breast cancer-seems to be remission. History of vulvar cancer-seems to be remission. -Outpatient follow-up  Goal of care: Patient seems to have advanced dementia.  Now with SBO and possible enteritis.  She looks frail.  Discussed CODE STATUS with patient's husband and son atbedside who prefers to continue full code. -Palliative medicine consulted.  Body mass index is 23.42 kg/m.         DVT prophylaxis:  enoxaparin (LOVENOX) injection 40 mg Start: 01/02/21 2200  Code Status: Full code Family Communication: Updated patient's son and husband at bedside Level of care: Med-Surg  Status is: Inpatient  Remains inpatient appropriate because:Ongoing diagnostic testing needed not appropriate for outpatient work up, IV treatments appropriate due to intensity of illness or inability to take PO and Inpatient level of care appropriate due to severity of illness   Dispo: The patient is from: Home              Anticipated d/c is to: Home              Patient currently is not medically stable to d/c.   Difficult to place patient No           Consultants:  General surgery   Sch Meds:  Scheduled Meds: . Chlorhexidine Gluconate Cloth  6 each Topical Daily  . enoxaparin (LOVENOX) injection  40 mg Subcutaneous Q24H  . insulin aspart  0-5 Units Subcutaneous QHS  . insulin aspart  0-9 Units Subcutaneous TID WC  . metoprolol tartrate  2.5 mg Intravenous Q6H  . pantoprazole (PROTONIX) IV  40 mg Intravenous Q24H   Continuous Infusions: . ampicillin-sulbactam (UNASYN) IV 3 g (01/05/21 1128)  . lactated ringers 100 mL/hr at 01/05/21 0602   PRN Meds:.acetaminophen, acetaminophen, ondansetron **OR** ondansetron (ZOFRAN) IV  Antimicrobials: Anti-infectives (From admission, onward)   Start     Dose/Rate Route Frequency Ordered  Stop   01/04/21 1200  Ampicillin-Sulbactam (UNASYN) 3 g in sodium chloride 0.9 % 100 mL IVPB        3 g 200 mL/hr over 30 Minutes Intravenous Every 6 hours 01/04/21 0823     01/03/21 1600  Ampicillin-Sulbactam (UNASYN) 3 g in sodium chloride 0.9 % 100 mL IVPB  Status:  Discontinued        3 g 200 mL/hr over 30 Minutes Intravenous Every 6 hours 01/03/21 1400 01/03/21 1405   01/03/21 1600  Ampicillin-Sulbactam (UNASYN) 3 g in sodium chloride 0.9 % 100 mL IVPB  Status:  Discontinued        3 g 200 mL/hr over 30 Minutes Intravenous Every 12 hours 01/03/21 1405 01/04/21 0823   01/02/21 1230  cefTRIAXone (ROCEPHIN) 1 g in sodium chloride 0.9 % 100 mL IVPB        1 g 200 mL/hr over 30 Minutes Intravenous  Once 01/02/21 1227 01/02/21 1315       I have personally reviewed the following labs and images: CBC: Recent Labs  Lab 01/02/21 0930 01/02/21 1520 01/03/21 0413 01/04/21 0425 01/05/21 0457  WBC 9.4 11.7* 15.5* 20.8* 13.6*  NEUTROABS 8.1*  --   --  17.7* 11.1*  HGB 10.6* 11.6* 14.4 12.6 10.9*  HCT 32.6* 35.1* 44.2 38.5 33.8*  MCV 90.1 90.9 90.8 90.0 91.8  PLT 171 168 200 197 156   BMP &GFR Recent Labs  Lab 01/02/21 0930 01/02/21 1520 01/03/21 0413 01/04/21 0425 01/05/21 0457  NA 140  --  141 144 143  K 2.9*  --  4.4 3.8 3.6  CL 109  --  107 106 108  CO2 17*  --  22 29 26   GLUCOSE 260*  --  216* 191* 135*  BUN 31*  --  32* 34* 38*  CREATININE 1.09* 1.12* 1.19* 1.11* 1.09*  CALCIUM 9.8  --  9.4 9.7 9.1  MG  --   --   --  2.0 2.0  PHOS  --   --   --  2.8  --    Estimated Creatinine Clearance: 31.5 mL/min (A) (by C-G formula based on SCr of 1.09 mg/dL (H)). Liver & Pancreas: Recent Labs  Lab 01/02/21 0930 01/03/21 0413 01/04/21 0425 01/05/21 0457  AST 27 23 20 17   ALT 16 15 13 13   ALKPHOS 78 70 62 50  BILITOT 0.6 0.5 0.9 0.8  PROT 7.4 6.9 6.5 5.9*  ALBUMIN 4.0 3.5 3.4* 3.1*   No results for input(s): LIPASE, AMYLASE in the last 168 hours. No results for  input(s): AMMONIA in the last 168 hours. Diabetic: Recent Labs    01/03/21 1532 01/04/21 0425  HGBA1C 6.1* 6.3*   Recent Labs  Lab 01/04/21 1123 01/04/21 1725 01/04/21 2010 01/05/21 0749 01/05/21 1131  GLUCAP 158* 144* 119* 133* 101*   Cardiac Enzymes: No results for input(s): CKTOTAL, CKMB, CKMBINDEX, TROPONINI in the last 168 hours. No results for input(s): PROBNP in the last 8760 hours. Coagulation Profile: No results for input(s): INR, PROTIME in the last 168 hours. Thyroid Function Tests: No results for input(s): TSH, T4TOTAL, FREET4, T3FREE, THYROIDAB in the last 72 hours. Lipid Profile: No results for input(s): CHOL, HDL, LDLCALC, TRIG, CHOLHDL, LDLDIRECT in the last 72 hours. Anemia Panel: No results for input(s): VITAMINB12, FOLATE, FERRITIN, TIBC, IRON, RETICCTPCT in the last 72 hours. Urine analysis:    Component Value Date/Time   COLORURINE YELLOW 01/02/2021 1014   APPEARANCEUR CLEAR 01/02/2021 1014   LABSPEC 1.018 01/02/2021 1014   PHURINE 5.0 01/02/2021 1014   GLUCOSEU 50 (A) 01/02/2021 1014   HGBUR NEGATIVE 01/02/2021 1014   BILIRUBINUR NEGATIVE 01/02/2021 1014   KETONESUR 20 (A) 01/02/2021 1014   PROTEINUR NEGATIVE 01/02/2021 1014   NITRITE NEGATIVE 01/02/2021 1014   LEUKOCYTESUR LARGE (A) 01/02/2021 1014   Sepsis Labs: Invalid input(s): PROCALCITONIN, [place]  Microbiology: Recent Results (from the past 240 hour(s))  Urine Culture     Status: None   Collection Time: 01/02/21 10:14 AM   Specimen: Urine, Catheterized  Result Value Ref Range Status   Specimen Description   Final    URINE, CATHETERIZED Performed at [facility] [address], [place], [place] [address]    Special Requests   Final    NONE Performed at [facility], [place] [address] [place], [place] [address]    Culture   Final    NO GROWTH Performed at [facility], [place] [address], [place], [place] [address]    Report Status  01/03/2021 FINAL  Final  Resp Panel by RT-PCR (Flu A&B, Covid) Nasopharyngeal Swab     Status: None   Collection Time: 01/02/21 10:15 AM   Specimen: Nasopharyngeal Swab; Nasopharyngeal(NP) swabs in vial transport medium  Result Value Ref Range Status   SARS Coronavirus 2 by RT PCR NEGATIVE NEGATIVE Final    Comment: (NOTE) SARS-CoV-2 target nucleic acids are NOT DETECTED.  The SARS-CoV-2 RNA is generally detectable in upper respiratory specimens during the acute phase of infection. The lowest concentration of SARS-CoV-2 viral copies this assay can detect is 138 copies/mL. A negative result does not preclude SARS-Cov-2 infection and should not be used as the sole basis for treatment or other patient management decisions. A negative result may occur with  improper specimen collection/handling, submission of specimen other than nasopharyngeal swab, presence of viral mutation(s) within the areas targeted by this assay, and inadequate number of viral copies(<138 copies/mL). A negative result must be combined with clinical observations, patient history, and epidemiological information. The expected result is Negative.  Fact Sheet for Patients:  EntrepreneurPulse.com.au  Fact Sheet for Healthcare Providers:  IncredibleEmployment.be  This test is no t yet approved or cleared by the [place] FDA and  has been authorized for detection and/or diagnosis of SARS-CoV-2 by FDA under an Emergency Use Authorization (EUA). This EUA will remain  in effect (meaning this test can be used) for the duration of the COVID-19 declaration under Section 564(b)(1) of the Act, 21 U.S.C.section 360bbb-3(b)(1), unless the authorization is terminated  or revoked sooner.       Influenza A by PCR NEGATIVE NEGATIVE Final   Influenza B by PCR NEGATIVE NEGATIVE Final    Comment: (NOTE) The Xpert Xpress SARS-CoV-2/FLU/RSV plus assay is intended as an aid in the diagnosis of  influenza from Nasopharyngeal swab specimens and should not be used as a sole basis for treatment. Nasal washings and aspirates are unacceptable for Xpert Xpress SARS-CoV-2/FLU/RSV testing.  Fact Sheet for Patients: EntrepreneurPulse.com.au  Fact Sheet for Healthcare Providers: IncredibleEmployment.be  This test is not yet approved or cleared by the [place] FDA and has been authorized for detection and/or diagnosis of SARS-CoV-2 by FDA under an Emergency Use Authorization (EUA). This EUA will remain in effect (meaning this test can be used) for the duration of the COVID-19 declaration under Section 564(b)(1) of the Act, 21 U.S.C. section 360bbb-3(b)(1), unless the authorization is terminated or revoked.  Performed at [facility], [place] [address], [place], [place] [address]   Culture, blood (routine x 2)     Status: None (Preliminary result)   Collection Time: 01/03/21  3:32 PM   Specimen: BLOOD  Result Value Ref Range Status   Specimen Description   Final    BLOOD RIGHT ANTECUBITAL Performed at [facility] [address], [place], [place] [address]    Special Requests   Final    BOTTLES DRAWN AEROBIC ONLY Blood Culture adequate volume Performed at [facility] [address], [place], [place] [address]    Culture   Final    NO GROWTH 2 DAYS Performed at [facility] [address], [place], [place] [address]    Report Status PENDING  Incomplete  Culture, blood (routine x 2)     Status: None (Preliminary result)   Collection Time: 01/03/21  3:32 PM   Specimen: BLOOD LEFT HAND  Result Value Ref Range Status   Specimen Description   Final    BLOOD LEFT HAND Performed at [facility], [place] [address] [place], [place] [address]    Special Requests   Final    BOTTLES DRAWN AEROBIC ONLY Blood Culture adequate volume Performed at [facility] [address], [place], [place] [address]    Culture   Final    NO GROWTH 2 DAYS Performed at [facility] [address] [place],  [address]    Report Status PENDING  Incomplete    Radiology Studies: DG Abd Portable 1V  Result Date: 01/05/2021 CLINICAL DATA:  Small-bowel obstruction and nasogastric tube placement. EXAM: PORTABLE ABDOMEN - 1 VIEW COMPARISON:  Film earlier today at 0724 hours FINDINGS: Nasogastric tube has been placed which is coiled in the proximal stomach below the diaphragm. Persistent dilated loops small bowel noted in the upper to mid abdomen. IMPRESSION: Nasogastric tube coiled in the proximal stomach. Persistent dilated small bowel loops. Electronically Signed   By: Aletta Edouard M.D.   On: 01/05/2021 12:43   DG Abd Portable 1V  Result Date: 01/05/2021 CLINICAL DATA:  Small-bowel obstruction EXAM: PORTABLE ABDOMEN - 1 VIEW COMPARISON:  Portable exam 0724 hours compared to 01/04/2021 FINDINGS: Persistent gaseous distension of small bowel loops in the upper and mid abdomen consistent with small bowel obstruction. Overall pattern in degree of dilatation is unchanged from the previous exam. Minimal colonic gas and RIGHT colon stool. No bowel wall thickening. Bones demineralized. Surgical clips at the inguinal regions bilaterally. IMPRESSION: Persistent small bowel obstruction. Electronically Signed   By: Lavonia Dana M.D.   On: 01/05/2021 08:14    Connie Lasater T. [place]  If 7PM-7AM, please contact night-coverage [email or web address] 01/05/2021, 2:37 PM

## 2021-01-05 NOTE — Progress Notes (Signed)
Patient removed NG tube. Family requesting that NG tube not be replaced until after repeat abdominal xray from gastrografin at 2000 is done and resulted. MD Cyndia Skeeters made aware

## 2021-01-05 NOTE — Progress Notes (Signed)
Subjective/Chief Complaint: Complains of bloating and abd pain. No vomiting   Objective: Vital signs in last 24 hours: Temp:  [98.9 F (37.2 C)] 98.9 F (37.2 C) (04/15 0604) Pulse Rate:  [67-79] 74 (04/15 0604) Resp:  [14-16] 14 (04/15 0604) BP: (136-159)/(57-78) 159/78 (04/15 0604) SpO2:  [98 %-99 %] 98 % (04/15 0604) Last BM Date: 01/02/21  Intake/Output from previous day: 04/14 0701 - 04/15 0700 In: 1112.9 [I.V.:918.3; IV Piggyback:194.6] Out: 0  Intake/Output this shift: No intake/output data recorded.  General appearance: alert and cooperative Resp: clear to auscultation bilaterally Cardio: regular rate and rhythm GI: soft, distended. mild diffuse tenderness  Lab Results:  Recent Labs    01/04/21 0425 01/05/21 0457  WBC 20.8* 13.6*  HGB 12.6 10.9*  HCT 38.5 33.8*  PLT 197 156   BMET Recent Labs    01/04/21 0425 01/05/21 0457  NA 144 143  K 3.8 3.6  CL 106 108  CO2 29 26  GLUCOSE 191* 135*  BUN 34* 38*  CREATININE 1.11* 1.09*  CALCIUM 9.7 9.1   PT/INR No results for input(s): LABPROT, INR in the last 72 hours. ABG No results for input(s): PHART, HCO3 in the last 72 hours.  Invalid input(s): PCO2, PO2  Studies/Results: DG Abd Portable 1V  Result Date: 01/05/2021 CLINICAL DATA:  Small-bowel obstruction EXAM: PORTABLE ABDOMEN - 1 VIEW COMPARISON:  Portable exam 0724 hours compared to 01/04/2021 FINDINGS: Persistent gaseous distension of small bowel loops in the upper and mid abdomen consistent with small bowel obstruction. Overall pattern in degree of dilatation is unchanged from the previous exam. Minimal colonic gas and RIGHT colon stool. No bowel wall thickening. Bones demineralized. Surgical clips at the inguinal regions bilaterally. IMPRESSION: Persistent small bowel obstruction. Electronically Signed   By: Lavonia Dana M.D.   On: 01/05/2021 08:14   DG Abd Portable 1V  Result Date: 01/04/2021 CLINICAL DATA:  Nausea, vomiting. EXAM: PORTABLE  ABDOMEN - 1 VIEW COMPARISON:  January 03, 2021. FINDINGS: Dilated small bowel loops are noted concerning for distal small bowel obstruction. No colonic dilatation is noted. Surgical clips are noted overlying both hip regions. IMPRESSION: Dilated small bowel loops are noted concerning for distal small bowel obstruction. Electronically Signed   By: Marijo Conception M.D.   On: 01/04/2021 09:37   DG Abd Portable 1V  Result Date: 01/03/2021 CLINICAL DATA:  Ileus EXAM: PORTABLE ABDOMEN - 1 VIEW COMPARISON:  CT 01/02/2021 FINDINGS: Air is seen within nondilated loops of large and small bowel within the abdomen. No evidence of obstruction. Excreted contrast is present within the urinary bladder. No radio-opaque calculi or other significant radiographic abnormality are seen. IMPRESSION: Nonobstructive bowel gas pattern. Electronically Signed   By: Davina Poke D.O.   On: 01/03/2021 09:51    Anti-infectives: Anti-infectives (From admission, onward)   Start     Dose/Rate Route Frequency Ordered Stop   01/04/21 1200  Ampicillin-Sulbactam (UNASYN) 3 g in sodium chloride 0.9 % 100 mL IVPB        3 g 200 mL/hr over 30 Minutes Intravenous Every 6 hours 01/04/21 0823     01/03/21 1600  Ampicillin-Sulbactam (UNASYN) 3 g in sodium chloride 0.9 % 100 mL IVPB  Status:  Discontinued        3 g 200 mL/hr over 30 Minutes Intravenous Every 6 hours 01/03/21 1400 01/03/21 1405   01/03/21 1600  Ampicillin-Sulbactam (UNASYN) 3 g in sodium chloride 0.9 % 100 mL IVPB  Status:  Discontinued  3 g 200 mL/hr over 30 Minutes Intravenous Every 12 hours 01/03/21 1405 01/04/21 0823   01/02/21 1230  cefTRIAXone (ROCEPHIN) 1 g in sodium chloride 0.9 % 100 mL IVPB        1 g 200 mL/hr over 30 Minutes Intravenous  Once 01/02/21 1227 01/02/21 1315      Assessment/Plan: s/p * No surgery found * npo  Pt refused ng yesterday abd xray shows persistent dilated small bowel c/w sbo Continue bowel rest for now. If no improvement  then may need exploration in next couple days Will follow  LOS: 1 day    Autumn Messing III 01/05/2021

## 2021-01-06 ENCOUNTER — Inpatient Hospital Stay (HOSPITAL_COMMUNITY): Payer: Medicare PPO

## 2021-01-06 DIAGNOSIS — Z515 Encounter for palliative care: Secondary | ICD-10-CM

## 2021-01-06 DIAGNOSIS — E870 Hyperosmolality and hypernatremia: Secondary | ICD-10-CM

## 2021-01-06 DIAGNOSIS — R531 Weakness: Secondary | ICD-10-CM

## 2021-01-06 LAB — CBC
HCT: 31.8 % — ABNORMAL LOW (ref 36.0–46.0)
Hemoglobin: 10.5 g/dL — ABNORMAL LOW (ref 12.0–15.0)
MCH: 30.3 pg (ref 26.0–34.0)
MCHC: 33 g/dL (ref 30.0–36.0)
MCV: 91.6 fL (ref 80.0–100.0)
Platelets: 163 10*3/uL (ref 150–400)
RBC: 3.47 MIL/uL — ABNORMAL LOW (ref 3.87–5.11)
RDW: 13.4 % (ref 11.5–15.5)
WBC: 12.1 10*3/uL — ABNORMAL HIGH (ref 4.0–10.5)
nRBC: 0 % (ref 0.0–0.2)

## 2021-01-06 LAB — COMPREHENSIVE METABOLIC PANEL
ALT: 13 U/L (ref 0–44)
AST: 18 U/L (ref 15–41)
Albumin: 3 g/dL — ABNORMAL LOW (ref 3.5–5.0)
Alkaline Phosphatase: 46 U/L (ref 38–126)
Anion gap: 12 (ref 5–15)
BUN: 34 mg/dL — ABNORMAL HIGH (ref 8–23)
CO2: 25 mmol/L (ref 22–32)
Calcium: 8.9 mg/dL (ref 8.9–10.3)
Chloride: 111 mmol/L (ref 98–111)
Creatinine, Ser: 1.29 mg/dL — ABNORMAL HIGH (ref 0.44–1.00)
GFR, Estimated: 43 mL/min — ABNORMAL LOW (ref >60)
Glucose, Bld: 138 mg/dL — ABNORMAL HIGH (ref 70–99)
Potassium: 3.5 mmol/L (ref 3.5–5.1)
Sodium: 148 mmol/L — ABNORMAL HIGH (ref 135–145)
Total Bilirubin: 0.8 mg/dL (ref 0.3–1.2)
Total Protein: 5.7 g/dL — ABNORMAL LOW (ref 6.5–8.1)

## 2021-01-06 LAB — GLUCOSE, CAPILLARY
Glucose-Capillary: 123 mg/dL — ABNORMAL HIGH (ref 70–99)
Glucose-Capillary: 192 mg/dL — ABNORMAL HIGH (ref 70–99)
Glucose-Capillary: 48 mg/dL — ABNORMAL LOW (ref 70–99)
Glucose-Capillary: 169 mg/dL — ABNORMAL HIGH (ref 70–99)
Glucose-Capillary: 167 mg/dL — ABNORMAL HIGH (ref 70–99)

## 2021-01-06 LAB — MAGNESIUM: Magnesium: 2.1 mg/dL (ref 1.7–2.4)

## 2021-01-06 LAB — PREALBUMIN: Prealbumin: 7.9 mg/dL — ABNORMAL LOW (ref 18–38)

## 2021-01-06 LAB — PHOSPHORUS: Phosphorus: 3.6 mg/dL (ref 2.5–4.6)

## 2021-01-06 MED ORDER — DEXTROSE-NACL 5-0.2 % IV SOLN: INTRAVENOUS | Status: DC

## 2021-01-06 MED ORDER — DEXTROSE-NACL 5-0.45 % IV SOLN: INTRAVENOUS | Status: DC

## 2021-01-06 MED ORDER — DEXTROSE 50 % IV SOLN
INTRAVENOUS | Status: AC
  Filled 2021-01-06: qty 50

## 2021-01-06 MED ORDER — INSULIN ASPART 100 UNIT/ML ~~LOC~~ SOLN
0.0000 [IU] | Freq: Every day | SUBCUTANEOUS | Status: DC
  Administered 2021-01-07: 2 [IU] via SUBCUTANEOUS

## 2021-01-06 MED ORDER — PHENOL 1.4 % MT LIQD
1.0000 | OROMUCOSAL | Status: DC | PRN
  Filled 2021-01-06: qty 177

## 2021-01-06 MED ORDER — DEXTROSE 50 % IV SOLN
25.0000 g | INTRAVENOUS | Status: AC
  Administered 2021-01-06: 25 g via INTRAVENOUS

## 2021-01-06 MED ORDER — INSULIN ASPART 100 UNIT/ML ~~LOC~~ SOLN
0.0000 [IU] | Freq: Three times a day (TID) | SUBCUTANEOUS | Status: DC
  Administered 2021-01-07 – 2021-01-08 (×3): 1 [IU] via SUBCUTANEOUS

## 2021-01-06 NOTE — Progress Notes (Signed)
pts gastrograffin 8hr delay results came back, on call Blount messaged on 4/15 at 2312 to notify and ask if another NG should be attempted tonight. Blount states as long as pt is not vomiting, we will wait until morning to see what treatment team wants to do. Pt has not vomited during my shift (7p-7a).

## 2021-01-06 NOTE — Progress Notes (Signed)
Hypoglycemic Event  CBG: 48  Treatment: D50-25mg /24ml full ampule  Symptoms: Lethargy, still arousable and speaking  Follow-up CBG: Time: 1208 CBG Result: 192  Possible Reasons for Event: NPO  Comments/MD notified: Yes    Wadell Craddock L Regana Kemple

## 2021-01-06 NOTE — Progress Notes (Signed)
PT Cancellation Note  Patient Details Name: [name] MRN: [id number] DOB: 1946/05/27   Cancelled Treatment:    Reason Eval/Treat Not Completed: Other (comment); deferred d/t N/V   [facility] 01/06/2021, 1:30 PM

## 2021-01-06 NOTE — Consult Note (Signed)
Consultation Note Date: 01/06/2021   Patient Name: [name]  DOB: 11-28-1945  MRN: [id number]  Age / Sex: 75 y.o., female  PCP: Glenis Smoker, MD Referring Physician: Mercy Riding, MD  Reason for Consultation: Establishing goals of care  HPI/Patient Profile: 75 y.o. female  admitted on 01/02/2021    75 year old F with PMH of dementia, CKD, CHB s/p PPM, DM-2, breast cancer and vulvar cancer presenting with nausea, vomiting and abdominal pain for over 24 hours after she had a seafood at a local restaurant.  She was admitted for profound hypokalemia, AKI and CT abdomen and pelvis concerning for enteritis.   Patient continued to have significant low abdominal pain.  Abdominal x-ray on 4/14 showed SBO.  General surgery consulted.  Started on SBO protocol with NG tube.  However, patient removed NG tube and refused reinsertion.  Repeat KUB with persistent SBO.  Chart review done, patient saw oncologist at [facility] Dr Polly Cobia in March 2022 and was noted to have no evidence of active disease.    Clinical Assessment and Goals of Care: Remains admitted to hospital medicine, with surgery following for SBO. Patient did not want NGT, on IV fluids and anti emetics.   Palliative consult for goals of care. Patient is resting in bed, does not currently have nausea. I introduced myself and palliative care as follows:  Palliative medicine is specialized medical care for people living with serious illness. It focuses on providing relief from the symptoms and stress of a serious illness. The goal is to improve quality of life for both the patient and the family.  Goals of care: Broad aims of medical therapy in relation to the patient's values and preferences. Our aim is to provide medical care aimed at enabling patients to achieve the goals that matter most to them, given the circumstances of their particular medical  situation and their constraints.   Call placed and discussed with son.  He is her primary caregiver and brings her to doctor's appointments as well.  We reviewed the scope of this hospitalization we discussed about her underlying conditions.  Patient son is very concerned about the patient's current condition.  He asks a lot of important questions regarding the nature of this bowel obstruction and current treatment plans.  Most of his questions are around whether or not an NG tube reinsertion will be attempted, when TPN will be initiated and event abdominal imaging will be repeated.  He is asking when a decision regarding taking a surgical approach will be made.  He also asks about general recovery process if she needs surgery.  Gently directed his attention back to the patient's underlying history of dementia, ongoing frailty and history of malignancy although she does not actively have a malignancy.  Discussed about CODE STATUS in detail.  Differences between full code and DO NOT RESUSCITATE explained.  He states that the patient has not ever discussed any kind of wishes training to what she would want if she were seriously ill or at end-of-life.  For now, she remains a full code.  Monitor hospital course and overall disease trajectory of illness to help guide further goals of care decisions.  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   SUMMARY OF RECOMMENDATIONS    Full Code, Full Scope care for now.  Recommend outpatient palliative care on discharge.  Code Status/Advance Care Planning:  Full code    Symptom Management:      Palliative Prophylaxis:   Delirium Protocol  Additional Recommendations (Limitations, Scope, Preferences):  Full Scope Treatment  Psycho-social/Spiritual:   Desire for further Chaplaincy support:yes  Additional Recommendations: Caregiving  Support/Resources  Prognosis:   Unable to determine  Discharge Planning:  To Be Determined      Primary Diagnoses: Present on Admission: . Intractable vomiting with nausea, unspecified vomiting type . SBO (small bowel obstruction) ([place])   I have reviewed the medical record, interviewed the patient and family, and examined the patient. The following aspects are pertinent.  Past Medical History:  Diagnosis Date  . Bradycardia 06/2018  . Breast cancer ([place])   . Cancer [facility]) left    breast cancer 2005 surgery, chemo and radiation   . Complete heart block ([place])   . Diabetes mellitus   . Family history of prostate cancer   . Headache   . Hypercholesteremia   . Macular degeneration   . Osteoarthritis   . 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 Vitamin D deficiency   . Vulvar cancer ([place]) 2017 new dx   Social History   Socioeconomic History  . Marital status: Married    Spouse name: Not on file  . Number of children: 2  . Years of education: Not on file  . Highest education level: Master's degree (e.g., MA, MS, MEng, MEd, MSW, MBA)  Occupational History  . Not on file  Tobacco Use  . Smoking status: Never Smoker  . Smokeless tobacco: Never Used  Vaping Use  . Vaping Use: Never used  Substance and Sexual Activity  . Alcohol use: Never  . Drug use: Never  . Sexual activity: Yes  Other Topics Concern  . Not on file  Social History Narrative   Lives at home with husband   Right handed   Caffeine: never   Social Determinants of Health   Financial Resource Strain: Low Risk   . Difficulty of Paying Living Expenses: Not hard at all  Food Insecurity: No Food Insecurity  . Worried About Charity fundraiser in the Last Year: Never true  . Ran Out of Food in the Last Year: Never true  Transportation Needs: No Transportation Needs  . Lack of Transportation (Medical): No  . Lack of Transportation (Non-Medical): No  Physical Activity: Not on file  Stress: Not on file  Social Connections: Moderately Isolated   . Frequency of Communication with Friends and Family: More than three times a week  . Frequency of Social Gatherings with Friends and Family: More than three times a week  . Attends Religious Services: Never  . Active Member of Clubs or Organizations: No  . Attends Archivist Meetings: Never  . Marital Status: Married   Family History  Problem Relation Age of Onset  . Dementia Mother   . Other Father        possible Dementia  . Prostate cancer Father   . HIV Brother   . Kidney failure Brother  Scheduled Meds: . Chlorhexidine Gluconate Cloth  6 each Topical Daily  . enoxaparin (LOVENOX) injection  40 mg Subcutaneous Q24H  . insulin aspart  0-5 Units Subcutaneous QHS  . insulin aspart  0-9 Units Subcutaneous TID WC  . metoprolol tartrate  2.5 mg Intravenous Q6H  . pantoprazole (PROTONIX) IV  40 mg Intravenous Q24H   Continuous Infusions: . ampicillin-sulbactam (UNASYN) IV 3 g (01/06/21 1235)  . dextrose 5 % and 0.45% NaCl 125 mL/hr at 01/06/21 1151   PRN Meds:.acetaminophen, acetaminophen, ondansetron **OR** ondansetron (ZOFRAN) IV Medications Prior to Admission:  Prior to Admission medications   Medication Sig Start Date End Date Taking? Authorizing Provider  Cyanocobalamin (VITAMIN B 12 PO) Take 1,000 mcg by mouth daily.   Yes [provider]  donepezil (ARICEPT) 10 MG tablet Take 10 mg by mouth at bedtime.   Yes [provider]  ergocalciferol (VITAMIN D2) 50000 UNITS capsule Take 50,000 Units by mouth every Tuesday.   Yes [provider]  flecainide (TAMBOCOR) 50 MG tablet Take 1 tablet (50 mg total) by mouth 2 (two) times daily. 10/20/18  Yes Evans Lance, MD  metoprolol tartrate (LOPRESSOR) 25 MG tablet Take 1 tablet (25 mg total) by mouth 2 (two) times daily. 03/29/19  Yes Evans Lance, MD  Omega-3 Fatty Acids (FISH OIL) 1200 MG CAPS Take 2,400 mg by mouth every morning.   Yes [provider]  sertraline (ZOLOFT) 100 MG  tablet Take 1 tablet (100 mg total) by mouth daily. 11/30/19  Yes Melvenia Beam, MD   Allergies  Allergen Reactions  . Duloxetine Itching    Cymbalta   Review of Systems Denies nausea currently  Physical Exam Appears weak, resting on her side Regular work of breathing Mildly distended abdomen No edema Awake, answers some questions.   Vital Signs: BP (!) 150/72   Pulse 79   Temp 97.9 F (36.6 C) (Oral)   Resp 18   Ht 5' (1.524 m)   Wt 54.4 kg   SpO2 99%   BMI 23.42 kg/m  Pain Scale: 0-10   Pain Score: 0-No pain   SpO2: SpO2: 99 % O2 Device:SpO2: 99 % O2 Flow Rate: .   IO: Intake/output summary:   Intake/Output Summary (Last 24 hours) at 01/06/2021 1500 Last data filed at 01/06/2021 0600 Gross per 24 hour  Intake 324.44 ml  Output --  Net 324.44 ml    LBM: Last BM Date: 01/02/21 Baseline Weight: Weight: 54.4 kg Most recent weight: Weight: 54.4 kg     Palliative Assessment/Data:   PPS 40%  Time In:  1430 Time Out:  1530  Time Total:  60  Greater than 50%  of this time was spent counseling and coordinating care related to the above assessment and plan.  Signed by: Loistine Chance, MD   Please contact Palliative Medicine Team phone at [phone] for questions and concerns.  For individual provider: See Shea Evans

## 2021-01-06 NOTE — Progress Notes (Signed)
Pt, although in wrist restraints, was able to get hold of the ng tubing and she pulled out the ng tube. Reinsertion attempted x3, however patient stiffening up and shaking her head, fighting against the tube, denies any nausea at this time, belly softly distended, faint hypo bs heard in RUQ, absent in all others, text page sent to on-call provider to make her aware, will continue to monitor for increasing abdominal pain or and N/V, patient resting comfortably at this time, restaints removed.

## 2021-01-06 NOTE — Progress Notes (Signed)
PROGRESS NOTE  [name]:[id number] DOB: [birth year]   PCP: Glenis Smoker, MD  Patient is from: Home  DOA: 01/02/2021 LOS: 2  Chief complaints: Nausea, vomiting and abdominal pain  Brief Narrative / Interim history: 75 year old F with PMH of dementia, CKD, CHB s/p PPM, DM-2, breast cancer and vulvar cancer presenting with nausea, vomiting and abdominal pain for over 24 hours after she had a seafood at a local restaurant.  She was admitted for profound hypokalemia, AKI and CT abdomen and pelvis concerning for enteritis.   Patient continued to have significant low abdominal pain.  Abdominal x-ray on 4/14 showed SBO.  General surgery consulted.  Started on SBO protocol with NG tube.  However, patient removed NG tube and refused reinsertion.  Repeat KUB with persistent SBO.   Subjective: Seen and examined earlier this morning.  Patient's son and other family members at bedside. She responded significantly with nausea, vomiting, abdominal pain, chest pain or dyspnea.  However, patient is not a reliable historian.  She is only oriented to herself.  She is not even able to identify her son or his significant other.  Per patient's son, she was tossing and turning overnight.  No emesis.  No bowel movement yet.  Objective: Vitals:   01/05/21 1352 01/05/21 2032 01/06/21 0034 01/06/21 0605  BP: (!) 149/69 (!) 160/87 (!) 153/73 (!) 161/71  Pulse: 71 84 85 96  Resp: 17 18 18 18   Temp: 98.3 F (36.8 C) 98 F (36.7 C)  98.1 F (36.7 C)  TempSrc:    Oral  SpO2:  98% 98% 100%  Weight:      Height:        Intake/Output Summary (Last 24 hours) at 01/06/2021 1121 Last data filed at 01/06/2021 0600 Gross per 24 hour  Intake 860.8 ml  Output 250 ml  Net 610.8 ml   Filed Weights   01/02/21 1717  Weight: 54.4 kg    Examination:  GENERAL: No apparent distress.  Nontoxic. HEENT: MMM.  Vision and hearing grossly intact.  NECK: Supple.  No apparent JVD.  RESP: On RA.  No  IWOB.  Fair aeration bilaterally. CVS:  RRR. Heart sounds normal.  ABD/GI/GU: BS+. Abd soft, NTND.  MSK/EXT:  Moves extremities. No apparent deformity. No edema.  SKIN: no apparent skin lesion or wound NEURO: Awake and alert.  Only oriented to self.  No apparent focal neuro deficit. PSYCH: Calm. Normal affect.   Procedures:  None  Microbiology summarized: [id number] and influenza PCR nonreactive. Urine culture NGTD. Blood culture pending  Assessment & Plan: Small bowel obstruction-patient had persistent abdominal pain.  X-ray shows small bowel obstruction.  -Pulled NG tube out on 4/15.  Refused replacement. -Continue bowel rest.  Needs NG tube replaced if further emesis or pain. -Encourage ambulation -Analgesics and antiemetics -Changed to LR to D5-1/2NS for hypernatremia.  Increase rate to 125 cc an hour.  D5-1/4NS not available. -N.p.o. except ice chips -Prealbumin 7.9.  She may need TPN if SBO persists.  Intractable nausea, vomiting and abdominal pain: Initial CT showed enteritis but no SBO.  She now has SBO.  Unclear if this was missed on CT or evolving at a time. UA with pyuria but urine culture and blood cultures negative.  She is afebrile. Leukocytosis improving. 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  Recent Labs  07/12/20 1428 07/19/20 1409 07/26/20 1359 10/11/20 1246 12/14/20 1117 01/02/21 0930 01/02/21 1520 01/03/21 0413 01/04/21 0425 01/05/21 0457 01/06/21 0317  BUN 15 15 15  25* 31* 31*  --  32* 34* 38* 34*  CREATININE 0.95 0.90 1.00 1.00 1.30* 1.09* 1.12* 1.19* 1.11* 1.09* 1.29*  -Hold nephrotoxic meds -IV fluid as above -Recheck in the morning  Normocytic anemia: Hgb at baseline. Recent Labs    07/19/20 1409 07/26/20 1359 10/11/20 1246 12/14/20 1039 01/02/21 0930 01/02/21 1520 01/03/21 0413  01/04/21 0425 01/05/21 0457 01/06/21 0317  HGB 9.5* 9.0* 8.5* 10.3* 10.6* 11.6* 14.4 12.6 10.9* 10.5*  -Continue monitoring  Diet-controlled diabetes with hyperglycemia and hyperlipidemia: A1c 6.3%. Recent Labs  Lab 01/05/21 0749 01/05/21 1131 01/05/21 1615 01/05/21 2159 01/06/21 0740  GLUCAP 133* 101* 115* 133* 123*  -Continue SSI and CBG monitoring  Hyponatremia: Na 148.  Likely from dehydration. -D5-1/2NS as above.  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 -Reorientation and delirium precautions  Hypokalemia: Likely due to GI loss on Maxzide.  Resolved.  CHB s/p PPM-slightly tachycardic. -IV metoprolol while n.p.o. for SBO.  History of breast cancer-seems to be remission. History of vulvar cancer-seems to be remission. -Outpatient follow-up  Goal of care: Patient seems to have advanced dementia.  Now with SBO and possible enteritis.  She looks frail.  Discussed CODE STATUS with patient's husband and son atbedside who prefers to continue full code. -Palliative medicine consulted.  Body mass index is 23.42 kg/m.         DVT prophylaxis:  enoxaparin (LOVENOX) injection 40 mg Start: 01/02/21 2200  Code Status: Full code Family Communication: Updated patient's son and his significant other at bedside. Level of care: Med-Surg  Status is: Inpatient  Remains inpatient appropriate because:Ongoing diagnostic testing needed not appropriate for outpatient work up, IV treatments appropriate due to intensity of illness or inability to take PO and Inpatient level of care appropriate due to severity of illness   Dispo: The patient is from: Home              Anticipated d/c is to: Home              Patient currently is not medically stable to d/c.   Difficult to place patient No           Consultants:  General surgery   Sch Meds:  Scheduled Meds: . Chlorhexidine Gluconate Cloth  6 each Topical Daily  .  enoxaparin (LOVENOX) injection  40 mg Subcutaneous Q24H  . insulin aspart  0-5 Units Subcutaneous QHS  . insulin aspart  0-9 Units Subcutaneous TID WC  . metoprolol tartrate  2.5 mg Intravenous Q6H  . pantoprazole (PROTONIX) IV  40 mg Intravenous Q24H   Continuous Infusions: . ampicillin-sulbactam (UNASYN) IV 3 g (01/06/21 0618)  . dextrose 5 % and 0.45% NaCl 100 mL/hr at 01/06/21 1030   PRN Meds:.acetaminophen, acetaminophen, ondansetron **OR** ondansetron (ZOFRAN) IV  Antimicrobials: Anti-infectives (From admission, onward)   Start     Dose/Rate Route Frequency Ordered Stop   01/04/21 1200  Ampicillin-Sulbactam (UNASYN) 3 g in sodium chloride 0.9 % 100 mL IVPB        3 g 200 mL/hr over 30 Minutes Intravenous Every 6 hours 01/04/21 0823     01/03/21 1600  Ampicillin-Sulbactam (UNASYN) 3 g in sodium chloride 0.9 % 100 mL IVPB  Status:  Discontinued        3 g 200 mL/hr over 30 Minutes Intravenous  Every 6 hours 01/03/21 1400 01/03/21 1405   01/03/21 1600  Ampicillin-Sulbactam (UNASYN) 3 g in sodium chloride 0.9 % 100 mL IVPB  Status:  Discontinued        3 g 200 mL/hr over 30 Minutes Intravenous Every 12 hours 01/03/21 1405 01/04/21 0823   01/02/21 1230  cefTRIAXone (ROCEPHIN) 1 g in sodium chloride 0.9 % 100 mL IVPB        1 g 200 mL/hr over 30 Minutes Intravenous  Once 01/02/21 1227 01/02/21 1315       I have personally reviewed the following labs and images: CBC: Recent Labs  Lab 01/02/21 0930 01/02/21 1520 01/03/21 0413 01/04/21 0425 01/05/21 0457 01/06/21 0317  WBC 9.4 11.7* 15.5* 20.8* 13.6* 12.1*  NEUTROABS 8.1*  --   --  17.7* 11.1*  --   HGB 10.6* 11.6* 14.4 12.6 10.9* 10.5*  HCT 32.6* 35.1* 44.2 38.5 33.8* 31.8*  MCV 90.1 90.9 90.8 90.0 91.8 91.6  PLT 171 168 200 197 156 163   BMP &GFR Recent Labs  Lab 01/02/21 0930 01/02/21 1520 01/03/21 0413 01/04/21 0425 01/05/21 0457 01/06/21 0317  NA 140  --  141 144 143 148*  K 2.9*  --  4.4 3.8 3.6 3.5  CL  109  --  107 106 108 111  CO2 17*  --  22 29 26 25   GLUCOSE 260*  --  216* 191* 135* 138*  BUN 31*  --  32* 34* 38* 34*  CREATININE 1.09* 1.12* 1.19* 1.11* 1.09* 1.29*  CALCIUM 9.8  --  9.4 9.7 9.1 8.9  MG  --   --   --  2.0 2.0 2.1  PHOS  --   --   --  2.8  --  3.6   Estimated Creatinine Clearance: 26.6 mL/min (A) (by C-G formula based on SCr of 1.29 mg/dL (H)). Liver & Pancreas: Recent Labs  Lab 01/02/21 0930 01/03/21 0413 01/04/21 0425 01/05/21 0457 01/06/21 0317  AST 27 23 20 17 18   ALT 16 15 13 13 13   ALKPHOS 78 70 62 50 46  BILITOT 0.6 0.5 0.9 0.8 0.8  PROT 7.4 6.9 6.5 5.9* 5.7*  ALBUMIN 4.0 3.5 3.4* 3.1* 3.0*   No results for input(s): LIPASE, AMYLASE in the last 168 hours. No results for input(s): AMMONIA in the last 168 hours. Diabetic: Recent Labs    01/03/21 1532 01/04/21 0425  HGBA1C 6.1* 6.3*   Recent Labs  Lab 01/05/21 0749 01/05/21 1131 01/05/21 1615 01/05/21 2159 01/06/21 0740  GLUCAP 133* 101* 115* 133* 123*   Cardiac Enzymes: No results for input(s): CKTOTAL, CKMB, CKMBINDEX, TROPONINI in the last 168 hours. No results for input(s): PROBNP in the last 8760 hours. Coagulation Profile: No results for input(s): INR, PROTIME in the last 168 hours. Thyroid Function Tests: No results for input(s): TSH, T4TOTAL, FREET4, T3FREE, THYROIDAB in the last 72 hours. Lipid Profile: No results for input(s): CHOL, HDL, LDLCALC, TRIG, CHOLHDL, LDLDIRECT in the last 72 hours. Anemia Panel: No results for input(s): VITAMINB12, FOLATE, FERRITIN, TIBC, IRON, RETICCTPCT in the last 72 hours. Urine analysis:    Component Value Date/Time   COLORURINE YELLOW 01/02/2021 1014   APPEARANCEUR CLEAR 01/02/2021 1014   LABSPEC 1.018 01/02/2021 1014   PHURINE 5.0 01/02/2021 1014   GLUCOSEU 50 (A) 01/02/2021 1014   HGBUR NEGATIVE 01/02/2021 1014   BILIRUBINUR NEGATIVE 01/02/2021 1014   KETONESUR 20 (A) 01/02/2021 1014   PROTEINUR NEGATIVE 01/02/2021 1014   NITRITE  NEGATIVE  01/02/2021 1014   LEUKOCYTESUR LARGE (A) 01/02/2021 1014   Sepsis Labs: Invalid input(s): PROCALCITONIN, [place]  Microbiology: Recent Results (from the past 240 hour(s))  Urine Culture     Status: None   Collection Time: 01/02/21 10:14 AM   Specimen: Urine, Catheterized  Result Value Ref Range Status   Specimen Description   Final    URINE, CATHETERIZED Performed at [facility] [address], [place], [place] [address]    Special Requests   Final    NONE Performed at [facility], [place] [address] [place], [place] [address]    Culture   Final    NO GROWTH Performed at [facility], [place] [address], [place], [place] [address]    Report Status 01/03/2021 FINAL  Final  Resp Panel by RT-PCR (Flu A&B, Covid) Nasopharyngeal Swab     Status: None   Collection Time: 01/02/21 10:15 AM   Specimen: Nasopharyngeal Swab; Nasopharyngeal(NP) swabs in vial transport medium  Result Value Ref Range Status   SARS Coronavirus 2 by RT PCR NEGATIVE NEGATIVE Final    Comment: (NOTE) SARS-CoV-2 target nucleic acids are NOT DETECTED.  The SARS-CoV-2 RNA is generally detectable in upper respiratory specimens during the acute phase of infection. The lowest concentration of SARS-CoV-2 viral copies this assay can detect is 138 copies/mL. A negative result does not preclude SARS-Cov-2 infection and should not be used as the sole basis for treatment or other patient management decisions. A negative result may occur with  improper specimen collection/handling, submission of specimen other than nasopharyngeal swab, presence of viral mutation(s) within the areas targeted by this assay, and inadequate number of viral copies(<138 copies/mL). A negative result must be combined with clinical observations, patient history, and epidemiological information. The expected result is Negative.  Fact Sheet for Patients:   EntrepreneurPulse.com.au  Fact Sheet for Healthcare Providers:  IncredibleEmployment.be  This test is no t yet approved or cleared by the [place] FDA and  has been authorized for detection and/or diagnosis of SARS-CoV-2 by FDA under an Emergency Use Authorization (EUA). This EUA will remain  in effect (meaning this test can be used) for the duration of the COVID-19 declaration under Section 564(b)(1) of the Act, 21 U.S.C.section 360bbb-3(b)(1), unless the authorization is terminated  or revoked sooner.       Influenza A by PCR NEGATIVE NEGATIVE Final   Influenza B by PCR NEGATIVE NEGATIVE Final    Comment: (NOTE) The Xpert Xpress SARS-CoV-2/FLU/RSV plus assay is intended as an aid in the diagnosis of influenza from Nasopharyngeal swab specimens and should not be used as a sole basis for treatment. Nasal washings and aspirates are unacceptable for Xpert Xpress SARS-CoV-2/FLU/RSV testing.  Fact Sheet for Patients: EntrepreneurPulse.com.au  Fact Sheet for Healthcare Providers: IncredibleEmployment.be  This test is not yet approved or cleared by the [place] FDA and has been authorized for detection and/or diagnosis of SARS-CoV-2 by FDA under an Emergency Use Authorization (EUA). This EUA will remain in effect (meaning this test can be used) for the duration of the COVID-19 declaration under Section 564(b)(1) of the Act, 21 U.S.C. section 360bbb-3(b)(1), unless the authorization is terminated or revoked.  Performed at [facility], [place] [address], [place], [place] [address]   Culture, blood (routine x 2)     Status: None (Preliminary result)   Collection Time: 01/03/21  3:32 PM   Specimen: BLOOD  Result Value Ref Range Status   Specimen Description  Final    BLOOD RIGHT ANTECUBITAL Performed at [facility] [address] [place], [place] [address]     Special Requests   Final    BOTTLES DRAWN AEROBIC ONLY Blood Culture adequate volume Performed at [facility] [address], [place], [place] [address]    Culture   Final    NO GROWTH 2 DAYS Performed at [facility] [address], [place], [place] [address]    Report Status PENDING  Incomplete  Culture, blood (routine x 2)     Status: None (Preliminary result)   Collection Time: 01/03/21  3:32 PM   Specimen: BLOOD LEFT HAND  Result Value Ref Range Status   Specimen Description   Final    BLOOD LEFT HAND Performed at [facility] [address], [place], [place] [address]    Special Requests   Final    BOTTLES DRAWN AEROBIC ONLY Blood Culture adequate volume Performed at [facility] [address], [place], [place] [address]    Culture   Final    NO GROWTH 2 DAYS Performed at [facility] [address], [place], [place] [address]    Report Status PENDING  Incomplete    Radiology Studies: DG Abd Portable 1V  Result Date: 01/06/2021 CLINICAL DATA:  Small bowel obstruction. EXAM: PORTABLE ABDOMEN - 1 VIEW COMPARISON:  01/05/2021 FINDINGS: Persistent dilated loops of small bowel containing oral contrast. Bowel gas pattern has not changed. Small bowel loops measure up to 5.4 cm. Persistent oral contrast in the stomach and no significant contrast in the colon. No significant gas in the colon. Limited evaluation for free air on the supine images. Multiple surgical clips in both inguinal regions. IMPRESSION: No significant change the bowel gas pattern. There continues to be markedly dilated loops of small bowel containing oral contrast. Findings are compatible with a high-grade small bowel obstruction. Electronically Signed   By: Markus Daft M.D.   On: 01/06/2021 11:06   DG Abd Portable 1V-Small Bowel Obstruction Protocol-initial, 8 hr delay  Result Date: 01/05/2021 CLINICAL DATA:  Small-bowel obstruction.  Eight hour follow-up image. EXAM: PORTABLE ABDOMEN - 1 VIEW COMPARISON:  01/05/2021 at 10:37 a.m. FINDINGS: Contrast is identified within numerous dilated small bowel loops. There is no evidence for colonic contrast, suspicious for significant partial or complete small bowel obstruction. No evidence for free intraperitoneal air. IMPRESSION: Persistent significant dilatation of small bowel loops consistent with obstruction, either complete or partial. Recommend follow-up plain film of the abdomen in 12 hours. No free intraperitoneal air. Electronically Signed   By: Nolon Nations M.D.   On: 01/05/2021 21:12    [place] T. [place]  If 7PM-7AM, please contact night-coverage [email or web address] 01/06/2021, 11:21 AM

## 2021-01-06 NOTE — Progress Notes (Signed)
Pt family member became very upset when a staff member responded to a bed alarm and when pt asked for water, the staff member offered water from the room. Staff member did not realize pt was NPO. Pt did not drink the water.  Family became angry and called this writer to the room, stating this was unacceptable and "if you work in a hospital, you should know what you're doing, you shouldn't defend her, I don't have to accept it." Family member then told this Probation officer that his cell phone was missing and he had it in the same area where staff had been earlier in the shift. 2 staff members attempted to help him find it, but were unsuccessful. Family remained with an aggressive demeanor until this writer informed him that he was making her feel uncomfortable with the Danna Casella he was talking to her. Husband then left. Pts son and female visitor now in room. AC notified.

## 2021-01-06 NOTE — Progress Notes (Signed)
Patient vomited in bed. Unsure of amount. MD aware.

## 2021-01-06 NOTE — Progress Notes (Signed)
Subjective/Chief Complaint: States she is having less bloating and abd pain. No vomiting.  NG in place yesterday but pulled out.  8h contrast films show no contrast in colon.  Pt states she has passed flatus   Objective: Vital signs in last 24 hours: Temp:  [98 F (36.7 C)-98.3 F (36.8 C)] 98.1 F (36.7 C) (04/16 0605) Pulse Rate:  [71-96] 96 (04/16 0605) Resp:  [17-18] 18 (04/16 0605) BP: (149-161)/(69-87) 161/71 (04/16 0605) SpO2:  [98 %-100 %] 100 % (04/16 0605) Last BM Date: 01/02/21  Intake/Output from previous day: 04/15 0701 - 04/16 0700 In: 2165.9 [I.V.:1860.5; IV Piggyback:305.4] Out: 250 [Emesis/NG output:250] Intake/Output this shift: No intake/output data recorded.  General appearance: alert and cooperative Resp: clear to auscultation bilaterally Cardio: regular rate and rhythm GI: soft, distended. mild diffuse tenderness  Lab Results:  Recent Labs    01/05/21 0457 01/06/21 0317  WBC 13.6* 12.1*  HGB 10.9* 10.5*  HCT 33.8* 31.8*  PLT 156 163   BMET Recent Labs    01/05/21 0457 01/06/21 0317  NA 143 148*  K 3.6 3.5  CL 108 111  CO2 26 25  GLUCOSE 135* 138*  BUN 38* 34*  CREATININE 1.09* 1.29*  CALCIUM 9.1 8.9   PT/INR No results for input(s): LABPROT, INR in the last 72 hours. ABG No results for input(s): PHART, HCO3 in the last 72 hours.  Invalid input(s): PCO2, PO2  Studies/Results: DG Abd Portable 1V-Small Bowel Obstruction Protocol-initial, 8 hr delay  Result Date: 01/05/2021 CLINICAL DATA:  Small-bowel obstruction. Eight hour follow-up image. EXAM: PORTABLE ABDOMEN - 1 VIEW COMPARISON:  01/05/2021 at 10:37 a.m. FINDINGS: Contrast is identified within numerous dilated small bowel loops. There is no evidence for colonic contrast, suspicious for significant partial or complete small bowel obstruction. No evidence for free intraperitoneal air. IMPRESSION: Persistent significant dilatation of small bowel loops consistent with  obstruction, either complete or partial. Recommend follow-up plain film of the abdomen in 12 hours. No free intraperitoneal air. Electronically Signed   By: Nolon Nations M.D.   On: 01/05/2021 21:12   DG Abd Portable 1V  Result Date: 01/05/2021 CLINICAL DATA:  Small-bowel obstruction and nasogastric tube placement. EXAM: PORTABLE ABDOMEN - 1 VIEW COMPARISON:  Film earlier today at 0724 hours FINDINGS: Nasogastric tube has been placed which is coiled in the proximal stomach below the diaphragm. Persistent dilated loops small bowel noted in the upper to mid abdomen. IMPRESSION: Nasogastric tube coiled in the proximal stomach. Persistent dilated small bowel loops. Electronically Signed   By: Aletta Edouard M.D.   On: 01/05/2021 12:43   DG Abd Portable 1V  Result Date: 01/05/2021 CLINICAL DATA:  Small-bowel obstruction EXAM: PORTABLE ABDOMEN - 1 VIEW COMPARISON:  Portable exam 0724 hours compared to 01/04/2021 FINDINGS: Persistent gaseous distension of small bowel loops in the upper and mid abdomen consistent with small bowel obstruction. Overall pattern in degree of dilatation is unchanged from the previous exam. Minimal colonic gas and RIGHT colon stool. No bowel wall thickening. Bones demineralized. Surgical clips at the inguinal regions bilaterally. IMPRESSION: Persistent small bowel obstruction. Electronically Signed   By: Lavonia Dana M.D.   On: 01/05/2021 08:14   DG Abd Portable 1V  Result Date: 01/04/2021 CLINICAL DATA:  Nausea, vomiting. EXAM: PORTABLE ABDOMEN - 1 VIEW COMPARISON:  January 03, 2021. FINDINGS: Dilated small bowel loops are noted concerning for distal small bowel obstruction. No colonic dilatation is noted. Surgical clips are noted overlying both hip regions. IMPRESSION: Dilated  small bowel loops are noted concerning for distal small bowel obstruction. Electronically Signed   By: Marijo Conception M.D.   On: 01/04/2021 09:37    Anti-infectives: Anti-infectives (From admission,  onward)   Start     Dose/Rate Route Frequency Ordered Stop   01/04/21 1200  Ampicillin-Sulbactam (UNASYN) 3 g in sodium chloride 0.9 % 100 mL IVPB        3 g 200 mL/hr over 30 Minutes Intravenous Every 6 hours 01/04/21 0823     01/03/21 1600  Ampicillin-Sulbactam (UNASYN) 3 g in sodium chloride 0.9 % 100 mL IVPB  Status:  Discontinued        3 g 200 mL/hr over 30 Minutes Intravenous Every 6 hours 01/03/21 1400 01/03/21 1405   01/03/21 1600  Ampicillin-Sulbactam (UNASYN) 3 g in sodium chloride 0.9 % 100 mL IVPB  Status:  Discontinued        3 g 200 mL/hr over 30 Minutes Intravenous Every 12 hours 01/03/21 1405 01/04/21 0823   01/02/21 1230  cefTRIAXone (ROCEPHIN) 1 g in sodium chloride 0.9 % 100 mL IVPB        1 g 200 mL/hr over 30 Minutes Intravenous  Once 01/02/21 1227 01/02/21 1315      Assessment/Plan: npo  Pt pulled out NG.  Explained to family member that this is delaying her recovery.   abd xray shows persistent dilated small bowel c/w sbo  Continue bowel rest for now. If she vomits or pain increases, will need to replace NG.  Encouraged ambulation today.  Will recheck AXR in AM  Will follow  LOS: 2 days    Rosario Adie 4/33/2951

## 2021-01-07 ENCOUNTER — Inpatient Hospital Stay (HOSPITAL_COMMUNITY): Payer: Medicare PPO | Admitting: Certified Registered Nurse Anesthetist

## 2021-01-07 ENCOUNTER — Encounter (HOSPITAL_COMMUNITY)
Admission: EM | Disposition: A | Discharge status: Home Health | Payer: Self-pay | Source: Home / Self Care | Attending: Student

## 2021-01-07 ENCOUNTER — Inpatient Hospital Stay (HOSPITAL_COMMUNITY): Payer: Medicare PPO

## 2021-01-07 HISTORY — PX: LAPAROTOMY: SHX154

## 2021-01-07 LAB — RENAL FUNCTION PANEL
Albumin: 2.7 g/dL — ABNORMAL LOW (ref 3.5–5.0)
Anion gap: 10 (ref 5–15)
BUN: 23 mg/dL (ref 8–23)
CO2: 28 mmol/L (ref 22–32)
Calcium: 8.4 mg/dL — ABNORMAL LOW (ref 8.9–10.3)
Chloride: 109 mmol/L (ref 98–111)
Creatinine, Ser: 1.1 mg/dL — ABNORMAL HIGH (ref 0.44–1.00)
GFR, Estimated: 52 mL/min — ABNORMAL LOW (ref >60)
Glucose, Bld: 208 mg/dL — ABNORMAL HIGH (ref 70–99)
Phosphorus: 2.1 mg/dL — ABNORMAL LOW (ref 2.5–4.6)
Potassium: 3 mmol/L — ABNORMAL LOW (ref 3.5–5.1)
Sodium: 147 mmol/L — ABNORMAL HIGH (ref 135–145)

## 2021-01-07 LAB — CBC
HCT: 30.2 % — ABNORMAL LOW (ref 36.0–46.0)
Hemoglobin: 9.6 g/dL — ABNORMAL LOW (ref 12.0–15.0)
MCH: 29.5 pg (ref 26.0–34.0)
MCHC: 31.8 g/dL (ref 30.0–36.0)
MCV: 92.9 fL (ref 80.0–100.0)
Platelets: 142 10*3/uL — ABNORMAL LOW (ref 150–400)
RBC: 3.25 MIL/uL — ABNORMAL LOW (ref 3.87–5.11)
RDW: 13.5 % (ref 11.5–15.5)
WBC: 7.6 10*3/uL (ref 4.0–10.5)
nRBC: 0 % (ref 0.0–0.2)

## 2021-01-07 LAB — GLUCOSE, CAPILLARY
Glucose-Capillary: 155 mg/dL — ABNORMAL HIGH (ref 70–99)
Glucose-Capillary: 103 mg/dL — ABNORMAL HIGH (ref 70–99)
Glucose-Capillary: 193 mg/dL — ABNORMAL HIGH (ref 70–99)
Glucose-Capillary: 216 mg/dL — ABNORMAL HIGH (ref 70–99)

## 2021-01-07 LAB — MAGNESIUM: Magnesium: 1.9 mg/dL (ref 1.7–2.4)

## 2021-01-07 LAB — SURGICAL PCR SCREEN
MRSA, PCR: POSITIVE — AB
Staphylococcus aureus: POSITIVE — AB

## 2021-01-07 SURGERY — LAPAROTOMY, EXPLORATORY
Anesthesia: General | Site: Abdomen

## 2021-01-07 MED ORDER — POTASSIUM CHLORIDE 10 MEQ/100ML IV SOLN
10.0000 meq | INTRAVENOUS | Status: DC
  Administered 2021-01-07 (×2): 10 meq via INTRAVENOUS
  Filled 2021-01-07 (×3): qty 100

## 2021-01-07 MED ORDER — SUGAMMADEX SODIUM 200 MG/2ML IV SOLN
INTRAVENOUS | Status: DC | PRN
  Administered 2021-01-07: 200 mg via INTRAVENOUS

## 2021-01-07 MED ORDER — POTASSIUM CHLORIDE 10 MEQ/100ML IV SOLN
10.0000 meq | INTRAVENOUS | Status: AC
  Administered 2021-01-07 (×2): 10 meq via INTRAVENOUS
  Filled 2021-01-07: qty 100

## 2021-01-07 MED ORDER — PROPOFOL 10 MG/ML IV BOLUS
INTRAVENOUS | Status: DC | PRN
  Administered 2021-01-07: 120 mg via INTRAVENOUS

## 2021-01-07 MED ORDER — PROPOFOL 10 MG/ML IV BOLUS
INTRAVENOUS | Status: AC
  Filled 2021-01-07: qty 20

## 2021-01-07 MED ORDER — ALBUMIN HUMAN 5 % IV SOLN: INTRAVENOUS | Status: DC | PRN

## 2021-01-07 MED ORDER — OXYCODONE HCL 5 MG PO TABS
5.0000 mg | ORAL_TABLET | Freq: Once | ORAL | Status: DC | PRN
Start: 2021-01-07 — End: 2021-01-07

## 2021-01-07 MED ORDER — ONDANSETRON HCL 4 MG/2ML IJ SOLN: 4.0000 mg | Freq: Once | INTRAMUSCULAR | Status: DC | PRN

## 2021-01-07 MED ORDER — ROCURONIUM BROMIDE 10 MG/ML (PF) SYRINGE
PREFILLED_SYRINGE | INTRAVENOUS | Status: DC | PRN
  Administered 2021-01-07: 50 mg via INTRAVENOUS

## 2021-01-07 MED ORDER — ONDANSETRON HCL 4 MG/2ML IJ SOLN
INTRAMUSCULAR | Status: DC | PRN
  Administered 2021-01-07: 4 mg via INTRAVENOUS

## 2021-01-07 MED ORDER — FENTANYL CITRATE (PF) 100 MCG/2ML IJ SOLN
INTRAMUSCULAR | Status: DC | PRN
  Administered 2021-01-07: 50 ug via INTRAVENOUS

## 2021-01-07 MED ORDER — LACTATED RINGERS IV SOLN: INTRAVENOUS | Status: DC | PRN

## 2021-01-07 MED ORDER — VASOPRESSIN 20 UNIT/ML IV SOLN
INTRAVENOUS | Status: DC | PRN
  Administered 2021-01-07: 1 [IU] via INTRAVENOUS

## 2021-01-07 MED ORDER — FENTANYL CITRATE (PF) 100 MCG/2ML IJ SOLN: 25.0000 ug | INTRAMUSCULAR | Status: DC | PRN

## 2021-01-07 MED ORDER — ENOXAPARIN SODIUM 40 MG/0.4ML ~~LOC~~ SOLN
40.0000 mg | SUBCUTANEOUS | Status: DC
  Administered 2021-01-08 – 2021-01-13 (×6): 40 mg via SUBCUTANEOUS
  Filled 2021-01-07 (×6): qty 0.4

## 2021-01-07 MED ORDER — DEXAMETHASONE SODIUM PHOSPHATE 10 MG/ML IJ SOLN
INTRAMUSCULAR | Status: DC | PRN
  Administered 2021-01-07: 10 mg via INTRAVENOUS

## 2021-01-07 MED ORDER — VASOPRESSIN 20 UNIT/ML IV SOLN
INTRAVENOUS | Status: AC
  Filled 2021-01-07: qty 1

## 2021-01-07 MED ORDER — PHENYLEPHRINE 40 MCG/ML (10ML) SYRINGE FOR IV PUSH (FOR BLOOD PRESSURE SUPPORT)
PREFILLED_SYRINGE | INTRAVENOUS | Status: DC | PRN
  Administered 2021-01-07 (×3): 120 ug via INTRAVENOUS

## 2021-01-07 MED ORDER — KCL IN DEXTROSE-NACL 30-5-0.45 MEQ/L-%-% IV SOLN
INTRAVENOUS | Status: DC
  Filled 2021-01-07 (×4): qty 1000

## 2021-01-07 MED ORDER — SODIUM CHLORIDE 0.9 % IR SOLN
Status: DC | PRN
  Administered 2021-01-07: 1000 mL

## 2021-01-07 MED ORDER — LIDOCAINE 2% (20 MG/ML) 5 ML SYRINGE
INTRAMUSCULAR | Status: AC
  Filled 2021-01-07: qty 5

## 2021-01-07 MED ORDER — SUCCINYLCHOLINE CHLORIDE 200 MG/10ML IV SOSY
PREFILLED_SYRINGE | INTRAVENOUS | Status: DC | PRN
  Administered 2021-01-07: 80 mg via INTRAVENOUS

## 2021-01-07 MED ORDER — EPHEDRINE SULFATE-NACL 50-0.9 MG/10ML-% IV SOSY
PREFILLED_SYRINGE | INTRAVENOUS | Status: DC | PRN
  Administered 2021-01-07 (×2): 10 mg via INTRAVENOUS

## 2021-01-07 MED ORDER — MUPIROCIN 2 % EX OINT
1.0000 "application " | TOPICAL_OINTMENT | Freq: Two times a day (BID) | CUTANEOUS | Status: AC
  Administered 2021-01-07 – 2021-01-11 (×9): 1 via NASAL
  Filled 2021-01-07 (×2): qty 22

## 2021-01-07 MED ORDER — OXYCODONE HCL 5 MG/5ML PO SOLN
5.0000 mg | Freq: Once | ORAL | Status: DC | PRN
Start: 2021-01-07 — End: 2021-01-07

## 2021-01-07 MED ORDER — ONDANSETRON HCL 4 MG/2ML IJ SOLN
INTRAMUSCULAR | Status: AC
  Filled 2021-01-07: qty 2

## 2021-01-07 MED ORDER — DEXAMETHASONE SODIUM PHOSPHATE 10 MG/ML IJ SOLN
INTRAMUSCULAR | Status: AC
  Filled 2021-01-07: qty 1

## 2021-01-07 MED ORDER — LIDOCAINE 2% (20 MG/ML) 5 ML SYRINGE
INTRAMUSCULAR | Status: DC | PRN
  Administered 2021-01-07: 60 mg via INTRAVENOUS

## 2021-01-07 MED ORDER — FENTANYL CITRATE (PF) 100 MCG/2ML IJ SOLN
INTRAMUSCULAR | Status: AC
  Filled 2021-01-07: qty 2

## 2021-01-07 MED ORDER — MORPHINE SULFATE (PF) 4 MG/ML IV SOLN
1.0000 mg | INTRAVENOUS | Status: DC | PRN
  Administered 2021-01-08 – 2021-01-09 (×3): 1 mg via INTRAVENOUS
  Administered 2021-01-11: 2 mg via INTRAVENOUS
  Filled 2021-01-07 (×4): qty 1

## 2021-01-07 SURGICAL SUPPLY — 43 items
APL SWBSTK 6 STRL LF DISP (MISCELLANEOUS) ×1
APPLICATOR COTTON TIP 6 STRL (MISCELLANEOUS) ×1 IMPLANT
APPLICATOR COTTON TIP 6IN STRL (MISCELLANEOUS) ×2
BLADE EXTENDED COATED 6.5IN (ELECTRODE) IMPLANT
BLADE HEX COATED 2.75 (ELECTRODE) ×2 IMPLANT
COVER MAYO STAND STRL (DRAPES) IMPLANT
COVER WAND RF STERILE (DRAPES) IMPLANT
DRAPE LAPAROSCOPIC ABDOMINAL (DRAPES) ×2 IMPLANT
DRAPE WARM FLUID 44X44 (DRAPES) IMPLANT
DRSG OPSITE POSTOP 4X12 (GAUZE/BANDAGES/DRESSINGS) ×1 IMPLANT
ELECT REM PT RETURN 15FT ADLT (MISCELLANEOUS) ×2 IMPLANT
GAUZE SPONGE 4X4 12PLY STRL (GAUZE/BANDAGES/DRESSINGS) ×2 IMPLANT
GLOVE SURG ENC MOIS LTX SZ7.5 (GLOVE) ×4 IMPLANT
GLOVE SURG UNDER POLY LF SZ7 (GLOVE) ×2 IMPLANT
GOWN STRL REUS W/ TWL XL LVL3 (GOWN DISPOSABLE) ×1 IMPLANT
GOWN STRL REUS W/TWL LRG LVL3 (GOWN DISPOSABLE) ×2 IMPLANT
GOWN STRL REUS W/TWL XL LVL3 (GOWN DISPOSABLE) ×4 IMPLANT
HANDLE SUCTION POOLE (INSTRUMENTS) IMPLANT
KIT BASIN OR (CUSTOM PROCEDURE TRAY) ×2 IMPLANT
KIT TURNOVER KIT A (KITS) ×2 IMPLANT
LIGASURE IMPACT 36 18CM CVD LR (INSTRUMENTS) ×1 IMPLANT
NS IRRIG 1000ML POUR BTL (IV SOLUTION) ×2 IMPLANT
PACK GENERAL/GYN (CUSTOM PROCEDURE TRAY) ×2 IMPLANT
PENCIL SMOKE EVACUATOR (MISCELLANEOUS) IMPLANT
RELOAD PROXIMATE 75MM BLUE (ENDOMECHANICALS) ×4 IMPLANT
RELOAD STAPLE 75 3.8 BLU REG (ENDOMECHANICALS) IMPLANT
SPONGE LAP 18X18 RF (DISPOSABLE) IMPLANT
STAPLER GUN LINEAR PROX 60 (STAPLE) ×1 IMPLANT
STAPLER PROXIMATE 75MM BLUE (STAPLE) ×1 IMPLANT
STAPLER VISISTAT 35W (STAPLE) ×2 IMPLANT
SUCTION POOLE HANDLE (INSTRUMENTS)
SUT PDS AB 1 CTX 36 (SUTURE) IMPLANT
SUT PDS AB 1 TP1 96 (SUTURE) ×2 IMPLANT
SUT SILK 2 0 (SUTURE)
SUT SILK 2 0 SH CR/8 (SUTURE) ×1 IMPLANT
SUT SILK 2-0 18XBRD TIE 12 (SUTURE) IMPLANT
SUT SILK 3 0 (SUTURE)
SUT SILK 3 0 SH CR/8 (SUTURE) ×1 IMPLANT
SUT SILK 3-0 18XBRD TIE 12 (SUTURE) IMPLANT
TOWEL OR 17X26 10 PK STRL BLUE (TOWEL DISPOSABLE) ×4 IMPLANT
TRAY FOLEY MTR SLVR 16FR STAT (SET/KITS/TRAYS/PACK) IMPLANT
WATER STERILE IRR 1000ML POUR (IV SOLUTION) ×2 IMPLANT
YANKAUER SUCT BULB TIP NO VENT (SUCTIONS) IMPLANT

## 2021-01-07 NOTE — Progress Notes (Signed)
Day of Surgery   Subjective/Chief Complaint: Complains of some abd pain. Seems confused   Objective: Vital signs in last 24 hours: Temp:  [97.9 F (36.6 C)-98.3 F (36.8 C)] 98.3 F (36.8 C) (04/17 0436) Pulse Rate:  [78-80] 80 (04/17 0436) Resp:  [18] 18 (04/17 0436) BP: (146-158)/(68-79) 146/68 (04/17 0436) SpO2:  [99 %-100 %] 100 % (04/17 0436) Last BM Date: 01/02/21  Intake/Output from previous day: 04/16 0701 - 04/17 0700 In: 2432.9 [I.V.:1932.9; IV Piggyback:500] Out: 1600 [Urine:800; Emesis/NG output:800] Intake/Output this shift: No intake/output data recorded.  General appearance: alert and cooperative Resp: clear to auscultation bilaterally Cardio: regular rate and rhythm GI: distended with mild tenderness  Lab Results:  Recent Labs    01/06/21 0317 01/07/21 0340  WBC 12.1* 7.6  HGB 10.5* 9.6*  HCT 31.8* 30.2*  PLT 163 142*   BMET Recent Labs    01/06/21 0317 01/07/21 0340  NA 148* 147*  K 3.5 3.0*  CL 111 109  CO2 25 28  GLUCOSE 138* 208*  BUN 34* 23  CREATININE 1.29* 1.10*  CALCIUM 8.9 8.4*   PT/INR No results for input(s): LABPROT, INR in the last 72 hours. ABG No results for input(s): PHART, HCO3 in the last 72 hours.  Invalid input(s): PCO2, PO2  Studies/Results: DG Abd 1 View  Result Date: 01/06/2021 CLINICAL DATA:  Confirm nasogastric tube placement. EXAM: ABDOMEN - 1 VIEW COMPARISON:  01/06/2021 at 10:06 a.m. FINDINGS: Nasogastric tube has been placed, tip overlying the level of the stomach. There is significant persistent dilatation of small bowel loops. Paucity of gas in the colon. Findings are consistent persistent small bowel obstruction. No free intraperitoneal air. IMPRESSION: Nasogastric tube to the stomach. Persistent high-grade small bowel obstruction. Electronically Signed   By: Nolon Nations M.D.   On: 01/06/2021 18:54   DG Abd Portable 1V  Result Date: 01/07/2021 CLINICAL DATA:  Small bowel obstruction EXAM: PORTABLE  ABDOMEN - 1 VIEW COMPARISON:  Yesterday FINDINGS: No progression of enteric contrast which has become more dilute. There is continued marked small bowel dilatation. Persistent contrast seen in the urinary bladder. The enteric tube is no longer seen. IMPRESSION: High-grade small bowel obstruction with no progression of enteric contrast. The enteric tube is no longer seen. Electronically Signed   By: Monte Fantasia M.D.   On: 01/07/2021 04:42   DG Abd Portable 1V  Result Date: 01/06/2021 CLINICAL DATA:  Small bowel obstruction. EXAM: PORTABLE ABDOMEN - 1 VIEW COMPARISON:  01/05/2021 FINDINGS: Persistent dilated loops of small bowel containing oral contrast. Bowel gas pattern has not changed. Small bowel loops measure up to 5.4 cm. Persistent oral contrast in the stomach and no significant contrast in the colon. No significant gas in the colon. Limited evaluation for free air on the supine images. Multiple surgical clips in both inguinal regions. IMPRESSION: No significant change the bowel gas pattern. There continues to be markedly dilated loops of small bowel containing oral contrast. Findings are compatible with a high-grade small bowel obstruction. Electronically Signed   By: Markus Daft M.D.   On: 01/06/2021 11:06   DG Abd Portable 1V-Small Bowel Obstruction Protocol-initial, 8 hr delay  Result Date: 01/05/2021 CLINICAL DATA:  Small-bowel obstruction. Eight hour follow-up image. EXAM: PORTABLE ABDOMEN - 1 VIEW COMPARISON:  01/05/2021 at 10:37 a.m. FINDINGS: Contrast is identified within numerous dilated small bowel loops. There is no evidence for colonic contrast, suspicious for significant partial or complete small bowel obstruction. No evidence for free intraperitoneal air.  IMPRESSION: Persistent significant dilatation of small bowel loops consistent with obstruction, either complete or partial. Recommend follow-up plain film of the abdomen in 12 hours. No free intraperitoneal air. Electronically Signed    By: Nolon Nations M.D.   On: 01/05/2021 21:12   DG Abd Portable 1V  Result Date: 01/05/2021 CLINICAL DATA:  Small-bowel obstruction and nasogastric tube placement. EXAM: PORTABLE ABDOMEN - 1 VIEW COMPARISON:  Film earlier today at 0724 hours FINDINGS: Nasogastric tube has been placed which is coiled in the proximal stomach below the diaphragm. Persistent dilated loops small bowel noted in the upper to mid abdomen. IMPRESSION: Nasogastric tube coiled in the proximal stomach. Persistent dilated small bowel loops. Electronically Signed   By: Aletta Edouard M.D.   On: 01/05/2021 12:43    Anti-infectives: Anti-infectives (From admission, onward)   Start     Dose/Rate Route Frequency Ordered Stop   01/04/21 1200  Ampicillin-Sulbactam (UNASYN) 3 g in sodium chloride 0.9 % 100 mL IVPB        3 g 200 mL/hr over 30 Minutes Intravenous Every 6 hours 01/04/21 0823     01/03/21 1600  Ampicillin-Sulbactam (UNASYN) 3 g in sodium chloride 0.9 % 100 mL IVPB  Status:  Discontinued        3 g 200 mL/hr over 30 Minutes Intravenous Every 6 hours 01/03/21 1400 01/03/21 1405   01/03/21 1600  Ampicillin-Sulbactam (UNASYN) 3 g in sodium chloride 0.9 % 100 mL IVPB  Status:  Discontinued        3 g 200 mL/hr over 30 Minutes Intravenous Every 12 hours 01/03/21 1405 01/04/21 0823   01/02/21 1230  cefTRIAXone (ROCEPHIN) 1 g in sodium chloride 0.9 % 100 mL IVPB        1 g 200 mL/hr over 30 Minutes Intravenous  Once 01/02/21 1227 01/02/21 1315      Assessment/Plan: s/p Procedure(s): EXPLORATORY LAPAROTOMY POSSIBLE SMALL BOWEL RESCTION (N/A) Persistent sbo with no improvement  At this point I feel she needs exploration today. I have discussed with the family and pt the risks and benefits of the surgery as well as some of the technical aspects and they understand and wish to proceed.  LOS: 3 days    Autumn Messing III 01/07/2021

## 2021-01-07 NOTE — Anesthesia Preprocedure Evaluation (Addendum)
Anesthesia Evaluation  Patient identified by MRN, date of birth, ID band Patient awake and Patient confused    Reviewed: Allergy & Precautions, NPO status , Patient's Chart, lab work & pertinent test results, reviewed documented beta blocker date and time   History of Anesthesia Complications Negative for: history of anesthetic complications  Airway Mallampati: II  TM Distance: >3 FB Neck ROM: Full    Dental  (+) Loose, Dental Advisory Given   Pulmonary neg pulmonary ROS,    Pulmonary exam normal        Cardiovascular Normal cardiovascular exam+ dysrhythmias (CHB) + pacemaker      Neuro/Psych Dementia negative neurological ROS     GI/Hepatic Neg liver ROS, SBO   Endo/Other  diabetes, Type 2  Renal/GU CRF and ARFRenal disease (AKI on CKD)  negative genitourinary   Musculoskeletal  (+) Arthritis ,   Abdominal   Peds  Hematology  (+) anemia , H/o breast & vulvar cancer   Anesthesia Other Findings  Echo 2019: EF 50-55%, mild MR, mild RV dilation, dilated tricuspid annulus w/ mild TR and concern for malcoaptation of the TV leaflets   Reproductive/Obstetrics                           Anesthesia Physical Anesthesia Plan  ASA: IV and emergent  Anesthesia Plan: General   Post-op Pain Management:    Induction: Intravenous, Rapid sequence and Cricoid pressure planned  PONV Risk Score and Plan: 4 or greater and Ondansetron, Dexamethasone and Treatment may vary due to age or medical condition  Airway Management Planned: Oral ETT  Additional Equipment: None  Intra-op Plan:   Post-operative Plan: Extubation in OR and Possible Post-op intubation/ventilation  Informed Consent: I have reviewed the patients History and Physical, chart, labs and discussed the procedure including the risks, benefits and alternatives for the proposed anesthesia with the patient or authorized representative who has  indicated his/her understanding and acceptance.     Dental advisory given and Consent reviewed with POA  Plan Discussed with:   Anesthesia Plan Comments: (Plan discussed with son. Possibility of post-op mechanical ventilation requirement was discussed in detail.)       Anesthesia Quick Evaluation

## 2021-01-07 NOTE — Progress Notes (Signed)
PROGRESS NOTE  [name] QIO:[id number] DOB: [birth year]   PCP: Glenis Smoker, MD  Patient is from: Home  DOA: 01/02/2021 LOS: 3  Chief complaints: Nausea, vomiting and abdominal pain  Brief Narrative / Interim history: 75 year old F with PMH of dementia, CKD, CHB s/p PPM, DM-2, breast cancer and vulvar cancer presenting with nausea, vomiting and abdominal pain for over 24 hours after she had a seafood at a local restaurant.  She was admitted for profound hypokalemia, AKI and CT abdomen and pelvis concerning for enteritis.   Patient continued to have significant low abdominal pain.  Abdominal x-ray on 4/14 showed SBO.  General surgery consulted.  Started on SBO protocol with NG tube.  However, patient removed NG tube and refused reinsertion.  Repeat KUB with persistent SBO.  Patient underwent ex lap with lysis of adhesion and small bowel resection on 01/07/2021.   Subjective: Seen and examined after he returned from surgery.  Symptoms are stable.  Reports pain.  Patient is not a reliable historian.  No question from family.  Objective: Vitals:   01/07/21 1045 01/07/21 1100 01/07/21 1115 01/07/21 1138  BP: (!) 146/73 (!) 145/71 135/71 (!) 141/69  Pulse: 73 75 73 76  Resp: 17 18 18 20   Temp:  (!) 97.3 F (36.3 C)  (!) 97.4 F (36.3 C)  TempSrc:    Oral  SpO2: 100% 100% 100% 100%  Weight:      Height:        Intake/Output Summary (Last 24 hours) at 01/07/2021 1230 Last data filed at 01/07/2021 1152 Gross per 24 hour  Intake 4957.51 ml  Output 4275 ml  Net 682.51 ml   Filed Weights   01/02/21 1717  Weight: 54.4 kg    Examination:  GENERAL: No apparent distress.  Nontoxic. HEENT: MMM.  Vision and hearing grossly intact.  NECK: Supple.  No apparent JVD.  RESP:  No IWOB.  Fair aeration bilaterally. CVS:  RRR. Heart sounds normal.  ABD/GI/GU: No BS.  Abdomen soft.  Honeycomb dressing DCI. MSK/EXT:  Moves extremities. No apparent deformity. No edema.  SKIN:  no apparent skin lesion or wound NEURO: Awake.  Oriented to self.  No apparent focal neuro deficit. PSYCH: Calm. Normal affect.  Procedures:  4/17-ex lap, lysis of adhesion and small bowel resection  Microbiology summarized: COVID-19 and influenza PCR nonreactive. Urine culture NGTD. Blood culture pending  Assessment & Plan: Small bowel obstruction-persisted despite NG tube. -Ex lap, lysis of adhesion and small bowel resection on 4/17. -Analgesics and antiemetics -Continue IV fluids -Prealbumin 7.9.  She may need TPN if bowel function does not return  Intractable nausea, vomiting and abdominal pain: Initial CT showed enteritis. UA with pyuria but urine culture and blood cultures negative.  She is afebrile. Leukocytosis resolved. 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  Recent Labs    07/19/20 1409 07/26/20 1359 10/11/20 1246 12/14/20 1117 01/02/21 0930 01/02/21 1520 01/03/21 0413 01/04/21 0425 01/05/21 0457 01/06/21 0317 01/07/21 0340  BUN 15 15 25* 31* 31*  --  32* 34* 38* 34* 23  CREATININE 0.90 1.00 1.00 1.30* 1.09* 1.12* 1.19* 1.11* 1.09* 1.29* 1.10*  -Hold nephrotoxic meds -IV fluid as above -Recheck in the morning  Normocytic anemia: Hgb at baseline. Recent Labs    07/26/20 1359 10/11/20 1246 12/14/20 1039 01/02/21 0930 01/02/21 1520  01/03/21 0413 01/04/21 0425 01/05/21 0457 01/06/21 0317 01/07/21 0340  HGB 9.0* 8.5* 10.3* 10.6* 11.6* 14.4 12.6 10.9* 10.5* 9.6*  -Continue monitoring  Diet-controlled diabetes with hyperglycemia and hyperlipidemia: A1c 6.3%. Recent Labs  Lab 01/06/21 1208 01/06/21 1623 01/06/21 2146 01/07/21 0747 01/07/21 1037  GLUCAP 192* 169* 167* 155* 103*  -Continue SSI and CBG monitoring  Hypernatremia: Na 148>> 147.  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 -Reorientation and delirium precautions  Hypokalemia: Likely due to GI loss on Maxzide.  -IV KCl 40 mEq x 1 -D5-1/2NS with KCl 40 mEq  CHB s/p PPM-slightly tachycardic. -IV metoprolol while n.p.o. for SBO.  History of breast cancer-seems to be remission. History of vulvar cancer-seems to be remission. -Outpatient follow-up  Thrombocytopenia:  Recent Labs  Lab 01/02/21 0930 01/02/21 1520 01/03/21 0413 01/04/21 0425 01/05/21 0457 01/06/21 0317 01/07/21 0340  PLT 171 168 200 197 156 163 142*  -Monitor   Goal of care: Patient seems to have advanced dementia.  Now with SBO and possible enteritis.  She looks frail.  Discussed CODE STATUS with patient's husband and son atbedside who prefers to continue full code. -Palliative medicine consulted.  Body mass index is 23.42 kg/m.         DVT prophylaxis:  enoxaparin (LOVENOX) injection 40 mg Start: 01/08/21 1000  Code Status: Full code Family Communication: Updated patient's son, father and other family members at bedside Level of care: Med-Surg  Status is: Inpatient  Remains inpatient appropriate because:Persistent severe electrolyte disturbances, Ongoing active pain requiring inpatient pain management, Ongoing diagnostic testing needed not appropriate for outpatient work up, IV treatments appropriate due to intensity of illness or inability to take PO and Inpatient level of care appropriate due to severity of illness   Dispo: The patient is from: Home              Anticipated d/c is to: Home              Patient currently is not medically stable to d/c.   Difficult to place patient No           Consultants:  General surgery Palliative medicine   Sch Meds:  Scheduled Meds: . Chlorhexidine Gluconate Cloth  6 each Topical Daily  . [START ON 01/08/2021] enoxaparin (LOVENOX) injection  40 mg Subcutaneous Q24H  .  insulin aspart  0-5 Units Subcutaneous QHS  . insulin aspart  0-6 Units Subcutaneous TID WC  . metoprolol tartrate  2.5 mg Intravenous Q6H  . mupirocin ointment  1 application Nasal BID  . pantoprazole (PROTONIX) IV  40 mg Intravenous Q24H   Continuous Infusions: . ampicillin-sulbactam (UNASYN) IV 3 g (01/07/21 1152)  . dexrose 5 % and 0.45 % NaCl with KCl 30 mEq/L Stopped (01/07/21 0857)  . potassium chloride 10 mEq (01/07/21 1202)   PRN Meds:.morphine injection, ondansetron **OR** ondansetron (ZOFRAN) IV, phenol  Antimicrobials: Anti-infectives (From admission, onward)   Start     Dose/Rate Route Frequency Ordered Stop   01/04/21 1200  Ampicillin-Sulbactam (UNASYN) 3 g in sodium chloride 0.9 % 100 mL IVPB        3 g 200 mL/hr over 30 Minutes Intravenous Every 6 hours 01/04/21 0823     01/03/21 1600  Ampicillin-Sulbactam (UNASYN) 3 g in sodium chloride 0.9 % 100 mL IVPB  Status:  Discontinued        3 g 200 mL/hr over 30 Minutes Intravenous  Every 6 hours 01/03/21 1400 01/03/21 1405   01/03/21 1600  Ampicillin-Sulbactam (UNASYN) 3 g in sodium chloride 0.9 % 100 mL IVPB  Status:  Discontinued        3 g 200 mL/hr over 30 Minutes Intravenous Every 12 hours 01/03/21 1405 01/04/21 0823   01/02/21 1230  cefTRIAXone (ROCEPHIN) 1 g in sodium chloride 0.9 % 100 mL IVPB        1 g 200 mL/hr over 30 Minutes Intravenous  Once 01/02/21 1227 01/02/21 1315       I have personally reviewed the following labs and images: CBC: Recent Labs  Lab 01/02/21 0930 01/02/21 1520 01/03/21 0413 01/04/21 0425 01/05/21 0457 01/06/21 0317 01/07/21 0340  WBC 9.4   < > 15.5* 20.8* 13.6* 12.1* 7.6  NEUTROABS 8.1*  --   --  17.7* 11.1*  --   --   HGB 10.6*   < > 14.4 12.6 10.9* 10.5* 9.6*  HCT 32.6*   < > 44.2 38.5 33.8* 31.8* 30.2*  MCV 90.1   < > 90.8 90.0 91.8 91.6 92.9  PLT 171   < > 200 197 156 163 142*   < > = values in this interval not displayed.   BMP &GFR Recent Labs  Lab 01/03/21 0413  01/04/21 0425 01/05/21 0457 01/06/21 0317 01/07/21 0340  NA 141 144 143 148* 147*  K 4.4 3.8 3.6 3.5 3.0*  CL 107 106 108 111 109  CO2 22 29 26 25 28   GLUCOSE 216* 191* 135* 138* 208*  BUN 32* 34* 38* 34* 23  CREATININE 1.19* 1.11* 1.09* 1.29* 1.10*  CALCIUM 9.4 9.7 9.1 8.9 8.4*  MG  --  2.0 2.0 2.1 1.9  PHOS  --  2.8  --  3.6 2.1*   Estimated Creatinine Clearance: 31.3 mL/min (A) (by C-G formula based on SCr of 1.1 mg/dL (H)). Liver & Pancreas: Recent Labs  Lab 01/02/21 0930 01/03/21 0413 01/04/21 0425 01/05/21 0457 01/06/21 0317 01/07/21 0340  AST 27 23 20 17 18   --   ALT 16 15 13 13 13   --   ALKPHOS 78 70 62 50 46  --   BILITOT 0.6 0.5 0.9 0.8 0.8  --   PROT 7.4 6.9 6.5 5.9* 5.7*  --   ALBUMIN 4.0 3.5 3.4* 3.1* 3.0* 2.7*   No results for input(s): LIPASE, AMYLASE in the last 168 hours. No results for input(s): AMMONIA in the last 168 hours. Diabetic: No results for input(s): HGBA1C in the last 72 hours. Recent Labs  Lab 01/06/21 1208 01/06/21 1623 01/06/21 2146 01/07/21 0747 01/07/21 1037  GLUCAP 192* 169* 167* 155* 103*   Cardiac Enzymes: No results for input(s): CKTOTAL, CKMB, CKMBINDEX, TROPONINI in the last 168 hours. No results for input(s): PROBNP in the last 8760 hours. Coagulation Profile: No results for input(s): INR, PROTIME in the last 168 hours. Thyroid Function Tests: No results for input(s): TSH, T4TOTAL, FREET4, T3FREE, THYROIDAB in the last 72 hours. Lipid Profile: No results for input(s): CHOL, HDL, LDLCALC, TRIG, CHOLHDL, LDLDIRECT in the last 72 hours. Anemia Panel: No results for input(s): VITAMINB12, FOLATE, FERRITIN, TIBC, IRON, RETICCTPCT in the last 72 hours. Urine analysis:    Component Value Date/Time   COLORURINE YELLOW 01/02/2021 1014   APPEARANCEUR CLEAR 01/02/2021 1014   LABSPEC 1.018 01/02/2021 1014   PHURINE 5.0 01/02/2021 1014   GLUCOSEU 50 (A) 01/02/2021 1014   HGBUR NEGATIVE 01/02/2021 1014   [place]  01/02/2021 1014  KETONESUR 20 (A) 01/02/2021 1014   PROTEINUR NEGATIVE 01/02/2021 1014   NITRITE NEGATIVE 01/02/2021 1014   LEUKOCYTESUR LARGE (A) 01/02/2021 1014   Sepsis Labs: Invalid input(s): PROCALCITONIN, [place]  Microbiology: Recent Results (from the past 240 hour(s))  Urine Culture     Status: None   Collection Time: 01/02/21 10:14 AM   Specimen: Urine, Catheterized  Result Value Ref Range Status   Specimen Description   Final    URINE, CATHETERIZED Performed at [facility] [address] [place], [place] [address]    Special Requests   Final    NONE Performed at [facility], [place] [address] [place], [place] [address]    Culture   Final    NO GROWTH Performed at [facility], [place] [address], [place], [place] [address]    Report Status 01/03/2021 FINAL  Final  Resp Panel by RT-PCR (Flu A&B, Covid) Nasopharyngeal Swab     Status: None   Collection Time: 01/02/21 10:15 AM   Specimen: Nasopharyngeal Swab; Nasopharyngeal(NP) swabs in vial transport medium  Result Value Ref Range Status   SARS Coronavirus 2 by RT PCR NEGATIVE NEGATIVE Final    Comment: (NOTE) SARS-CoV-2 target nucleic acids are NOT DETECTED.  The SARS-CoV-2 RNA is generally detectable in upper respiratory specimens during the acute phase of infection. The lowest concentration of SARS-CoV-2 viral copies this assay can detect is 138 copies/mL. A negative result does not preclude SARS-Cov-2 infection and should not be used as the sole basis for treatment or other patient management decisions. A negative result may occur with  improper specimen collection/handling, submission of specimen other than nasopharyngeal swab, presence of viral mutation(s) within the areas targeted by this assay, and inadequate number of viral copies(<138 copies/mL). A negative result must be combined with clinical observations, patient history, and  epidemiological information. The expected result is Negative.  Fact Sheet for Patients:  EntrepreneurPulse.com.au  Fact Sheet for Healthcare Providers:  IncredibleEmployment.be  This test is no t yet approved or cleared by the [place] FDA and  has been authorized for detection and/or diagnosis of SARS-CoV-2 by FDA under an Emergency Use Authorization (EUA). This EUA will remain  in effect (meaning this test can be used) for the duration of the COVID-19 declaration under Section 564(b)(1) of the Act, 21 U.S.C.section 360bbb-3(b)(1), unless the authorization is terminated  or revoked sooner.       Influenza A by PCR NEGATIVE NEGATIVE Final   Influenza B by PCR NEGATIVE NEGATIVE Final    Comment: (NOTE) The Xpert Xpress SARS-CoV-2/FLU/RSV plus assay is intended as an aid in the diagnosis of influenza from Nasopharyngeal swab specimens and should not be used as a sole basis for treatment. Nasal washings and aspirates are unacceptable for Xpert Xpress SARS-CoV-2/FLU/RSV testing.  Fact Sheet for Patients: EntrepreneurPulse.com.au  Fact Sheet for Healthcare Providers: IncredibleEmployment.be  This test is not yet approved or cleared by the [place] FDA and has been authorized for detection and/or diagnosis of SARS-CoV-2 by FDA under an Emergency Use Authorization (EUA). This EUA will remain in effect (meaning this test can be used) for the duration of the COVID-19 declaration under Section 564(b)(1) of the Act, 21 U.S.C. section 360bbb-3(b)(1), unless the authorization is terminated or revoked.  Performed at [facility], [place] [address], [place], [place] [address]   Culture, blood (routine x 2)     Status: None (Preliminary result)   Collection Time: 01/03/21  3:32 PM  Specimen: BLOOD  Result Value Ref Range Status   Specimen Description   Final    BLOOD RIGHT  ANTECUBITAL Performed at [facility] [address] [place], [place] [address]    Special Requests   Final    BOTTLES DRAWN AEROBIC ONLY Blood Culture adequate volume Performed at [facility] [address], [place], [place] [address]    Culture   Final    NO GROWTH 3 DAYS Performed at [facility], [place] [address] [place], [place] [address]    Report Status PENDING  Incomplete  Culture, blood (routine x 2)     Status: None (Preliminary result)   Collection Time: 01/03/21  3:32 PM   Specimen: BLOOD LEFT HAND  Result Value Ref Range Status   Specimen Description   Final    BLOOD LEFT HAND Performed at [facility] [address] [place], [place] [address]    Special Requests   Final    BOTTLES DRAWN AEROBIC ONLY Blood Culture adequate volume Performed at [facility] [address], [place], [place] [address]    Culture   Final    NO GROWTH 3 DAYS Performed at [facility], [place] [address], [place], [place] [address]    Report Status PENDING  Incomplete  Surgical pcr screen     Status: Abnormal   Collection Time: 01/07/21  7:52 AM   Specimen: Nasal Mucosa; Nasal Swab  Result Value Ref Range Status   MRSA, PCR POSITIVE (A) NEGATIVE Final    Comment: RESULT CALLED TO, READ BACK BY AND VERIFIED WITH: JT AT 1013 ON 01/07/2021 BY MOSLEY,J    Staphylococcus aureus POSITIVE (A) NEGATIVE Final    Comment: (NOTE) The Xpert SA Assay (FDA approved for NASAL specimens in patients 76 years of age and older), is one component of a comprehensive surveillance program. It is not intended to diagnose infection nor to guide or monitor treatment. Performed at [facility], [place] [address] [place],  [address]     Radiology Studies: DG Abd 1 View  Result Date: 01/06/2021 CLINICAL DATA:  Confirm nasogastric tube placement. EXAM: ABDOMEN - 1 VIEW COMPARISON:  01/06/2021 at  10:06 a.m. FINDINGS: Nasogastric tube has been placed, tip overlying the level of the stomach. There is significant persistent dilatation of small bowel loops. Paucity of gas in the colon. Findings are consistent persistent small bowel obstruction. No free intraperitoneal air. IMPRESSION: Nasogastric tube to the stomach. Persistent high-grade small bowel obstruction. Electronically Signed   By: Nolon Nations M.D.   On: 01/06/2021 18:54   DG Abd Portable 1V  Result Date: 01/07/2021 CLINICAL DATA:  Small bowel obstruction EXAM: PORTABLE ABDOMEN - 1 VIEW COMPARISON:  Yesterday FINDINGS: No progression of enteric contrast which has become more dilute. There is continued marked small bowel dilatation. Persistent contrast seen in the urinary bladder. The enteric tube is no longer seen. IMPRESSION: High-grade small bowel obstruction with no progression of enteric contrast. The enteric tube is no longer seen. Electronically Signed   By: Monte Fantasia M.D.   On: 01/07/2021 04:42    Emalie Mcwethy T. [place]  If 7PM-7AM, please contact night-coverage [email or web address] 01/07/2021, 12:30 PM

## 2021-01-07 NOTE — Progress Notes (Signed)
Patient's son at bedside, consent signed after speaking w/ Dr Marlou Starks. After signing consent, patient's husband arrived. Verified with Drema Dallas, patient's husband that he also consents to surgery and verified that patient's son [name] was authorized to sign. Patient to surgery on bed.

## 2021-01-07 NOTE — Progress Notes (Signed)
CRITICAL RESULT PROVIDER NOTIFICATION  Test performed and critical result:  MRSA nasal pcr swab  Date and time result received:  01/07/21 1015  Provider name/title: Autumn Messing  Date and time provider notified: 01/07/21 1025  Date and time provider responded: 01/07/21  Provider response:  MD in surgery with patient. Called OR desk to inform them, RN stated she would inform Dr Marlou Starks of the result.

## 2021-01-07 NOTE — Anesthesia Procedure Notes (Signed)
Procedure Name: Intubation Date/Time: 01/07/2021 9:11 AM Performed by: Gerald Leitz, CRNA Pre-anesthesia Checklist: Patient identified, Patient being monitored, Timeout performed, Emergency Drugs available and Suction available Patient Re-evaluated:Patient Re-evaluated prior to induction Oxygen Delivery Method: Circle system utilized Preoxygenation: Pre-oxygenation with 100% oxygen Induction Type: IV induction and Rapid sequence Ventilation: Mask ventilation without difficulty Laryngoscope Size: Mac and 3 Grade View: Grade I Tube type: Oral Tube size: 7.0 mm Number of attempts: 1 Placement Confirmation: ETT inserted through vocal cords under direct vision,  positive ETCO2 and breath sounds checked- equal and bilateral Secured at: 21 cm Tube secured with: Tape Dental Injury: Teeth and Oropharynx as per pre-operative assessment

## 2021-01-07 NOTE — Op Note (Signed)
01/07/2021  10:23 AM  PATIENT:  [name]  75 y.o. female  PRE-OPERATIVE DIAGNOSIS:  SMALL BOWEL OBSTRUCTION  POST-OPERATIVE DIAGNOSIS:  SMALL BOWEL OBSTRUCTION  PROCEDURE:  Procedure(s): EXPLORATORY LAPAROTOMY, LYSIS OF ADHESIONS, SMALL BOWEL RESECTION  SURGEON:  Surgeon(s) and Role:    * Jovita Kussmaul, MD - Primary    * Leighton Ruff, MD - Assisting  PHYSICIAN ASSISTANT:   ASSISTANTS: Dr. Marcello Moores   ANESTHESIA:   general  EBL:  minimal   BLOOD ADMINISTERED:none  DRAINS: none   LOCAL MEDICATIONS USED:  NONE  SPECIMEN:  Source of Specimen:  small bowel segment  DISPOSITION OF SPECIMEN:  PATHOLOGY  COUNTS:  YES  TOURNIQUET:  * No tourniquets in log *  DICTATION: .Dragon Dictation   After informed consent was obtained the patient was brought to the operating room and placed in the supine position on the operating table.  After adequate induction of general anesthesia the patient's abdomen was prepped with ChloraPrep, allowed to dry, and draped in usual sterile manner.  An appropriate timeout was performed.  A midline incision was made with a 15 blade knife.  The incision was carried through the skin and subcutaneous tissue sharply with the electrocautery until the linea alba was identified.  The linea alba was also incised with the electrocautery.  The preperitoneal space was probed bluntly with a hemostat until the peritoneum was opened and access was gained to the abdominal cavity.  The rest of the incision was opened under direct vision.  A large amount of bloody watery fluid was evacuated.  The small bowel was then run from the ligament of Treitz to the ileocecal valve.  Along the midportion of the small intestine there was a dense adhesion identified that was the source of the obstruction.  This adhesion was lysed sharply with the electrocautery.  The proximal small bowel was extremely dilated and had several serosal tears.  There was 1 short segment of small  intestine that had questionable viability.  Because of this I elected to resect a short segment of small intestine to include the worst of the serosal tears as well as the short segment of questionable viability.  The small bowel was divided through a small mesenteric window above and below the area in question with firings of a GIA-75 stapler.  A small opening was made on the antimesenteric edge of each limb of small bowel.  Through this the contents of the small intestine were evacuated with a pool sucker.  The small bowel remaining at this point looked much happier and pinked up nicely.  Each limb of a GIA-75 stapler was then placed down the appropriate limb of small bowel, clamped, and fired thereby creating a nice widely patent enteroenterostomy.  The common opening was closed with a single firing of a TA 60 stapler.  Several other smaller serosal tears were repaired with 3-0 silk Lembert stitches.  The small bowel was run again from the ligament of Treitz to the ileocecal valve and the anastomosis was widely patent and looked healthy.  The bladder was distended.  We did not place a Foley prior to the operation because of the amount of radiation injury to her perineum from previous treatment.  This will need to be addressed postoperatively.  No other abnormalities were noted.  The abdomen was irrigated with copious amounts of saline.  The NG tube was confirmed in good position in the stomach.  The fascia of the anterior abdominal wall was closed with  2 running #1 double-stranded looped PDS sutures.  The subcutaneous tissue was irrigated with saline and the skin was closed with staples.  Sterile dressings were applied.  The patient tolerated the procedure well.  At the end of the case all needle sponge and instrument counts were correct.  The patient was then awakened and taken to recovery in stable condition.  PLAN OF CARE: Admit to inpatient   PATIENT DISPOSITION:  PACU - hemodynamically stable.   Delay  start of Pharmacological VTE agent (>24hrs) due to surgical blood loss or risk of bleeding: no

## 2021-01-07 NOTE — Anesthesia Postprocedure Evaluation (Signed)
Anesthesia Post Note  Patient: [name]  Procedure(s) Performed: EXPLORATORY LAPAROTOMY POSSIBLE SMALL BOWEL RESCTION (N/A Abdomen)     Patient location during evaluation: PACU Anesthesia Type: General Level of consciousness: awake and alert Pain management: pain level controlled Vital Signs Assessment: post-procedure vital signs reviewed and stable Respiratory status: spontaneous breathing, nonlabored ventilation, respiratory function stable and patient connected to nasal cannula oxygen Cardiovascular status: blood pressure returned to baseline and stable Postop Assessment: no apparent nausea or vomiting Anesthetic complications: no   No complications documented.  Last Vitals:  Vitals:   01/07/21 1045 01/07/21 1100  BP: (!) 146/73 (!) 145/71  Pulse: 73 75  Resp: 17 18  Temp:  (!) 36.3 C  SpO2: 100% 100%    Last Pain:  Vitals:   01/07/21 1100  TempSrc:   PainSc: [place]

## 2021-01-07 NOTE — Transfer of Care (Signed)
Immediate Anesthesia Transfer of Care Note  Patient: [name]  Procedure(s) Performed: Procedure(s): EXPLORATORY LAPAROTOMY POSSIBLE SMALL BOWEL RESCTION (N/A)  Patient Location: PACU  Anesthesia Type:General  Level of Consciousness: Alert, Awake, Oriented  Airway & Oxygen Therapy: Patient Spontanous Breathing  Post-op Assessment: Report given to RN  Post vital signs: Reviewed and stable  Last Vitals:  Vitals:   01/06/21 2152 01/07/21 0436  BP: (!) 158/79 (!) 146/68  Pulse: 78 80  Resp: 18 18  Temp: 36.8 C 36.8 C  SpO2: 501% 586%    Complications: No apparent anesthesia complications

## 2021-01-07 NOTE — Plan of Care (Signed)
  Problem: Coping: Goal: Level of anxiety will decrease Outcome: Progressing   Problem: Pain Managment: Goal: General experience of comfort will improve Outcome: Progressing   

## 2021-01-07 NOTE — Progress Notes (Signed)
Pharmacy Antibiotic Note  [name] is a 75 y.o. female admitted on 01/02/2021 with intractable nausea and vomiting.  Pharmacy consulted for Unasyn dosing.  Day #5 of Unasyn.  Plan: Continue Unasyn 3g IV q6h Follow up renal function & cultures as available Consider monitoring off abx soon?  Height: 5' (152.4 cm) Weight: 54.4 kg (119 lb 14.9 oz) IBW/kg (Calculated) : 45.5  Temp (24hrs), Avg:97.9 F (36.6 C), Min:97.3 F (36.3 C), Max:98.3 F (36.8 C)  Recent Labs  Lab 01/03/21 0413 01/04/21 0425 01/04/21 1557 01/05/21 0457 01/06/21 0317 01/07/21 0340  WBC 15.5* 20.8*  --  13.6* 12.1* 7.6  CREATININE 1.19* 1.11*  --  1.09* 1.29* 1.10*  LATICACIDVEN  --   --  1.5 1.0  --   --     Estimated Creatinine Clearance: 31.3 mL/min (A) (by C-G formula based on SCr of 1.1 mg/dL (H)).    Allergies  Allergen Reactions  . Duloxetine Itching    Cymbalta    Antimicrobials this admission: 4/12 CTX x 1 4/13 Unasyn >>  Dose adjustments this admission: 4/14 Increased Unasyn from q12h to q6h  Microbiology results: 4/12 UCx: NGF  Thank you for allowing pharmacy to be a part of this patient's care.  Elenor Quinones, PharmD, BCPS, BCIDP Clinical Pharmacist 01/07/2021 12:35 PM

## 2021-01-08 ENCOUNTER — Encounter (HOSPITAL_COMMUNITY): Payer: Self-pay | Admitting: General Surgery

## 2021-01-08 DIAGNOSIS — Z515 Encounter for palliative care: Secondary | ICD-10-CM

## 2021-01-08 DIAGNOSIS — E440 Moderate protein-calorie malnutrition: Secondary | ICD-10-CM | POA: Diagnosis present

## 2021-01-08 DIAGNOSIS — Z789 Other specified health status: Secondary | ICD-10-CM

## 2021-01-08 LAB — CBC
HCT: 25.8 % — ABNORMAL LOW (ref 36.0–46.0)
Hemoglobin: 8.2 g/dL — ABNORMAL LOW (ref 12.0–15.0)
MCH: 29.5 pg (ref 26.0–34.0)
MCHC: 31.8 g/dL (ref 30.0–36.0)
MCV: 92.8 fL (ref 80.0–100.0)
Platelets: 133 10*3/uL — ABNORMAL LOW (ref 150–400)
RBC: 2.78 MIL/uL — ABNORMAL LOW (ref 3.87–5.11)
RDW: 13.6 % (ref 11.5–15.5)
WBC: 11.4 10*3/uL — ABNORMAL HIGH (ref 4.0–10.5)
nRBC: 0.2 % (ref 0.0–0.2)

## 2021-01-08 LAB — COMPREHENSIVE METABOLIC PANEL
ALT: 11 U/L (ref 0–44)
AST: 18 U/L (ref 15–41)
Albumin: 2.4 g/dL — ABNORMAL LOW (ref 3.5–5.0)
Alkaline Phosphatase: 31 U/L — ABNORMAL LOW (ref 38–126)
Anion gap: 7 (ref 5–15)
BUN: 16 mg/dL (ref 8–23)
CO2: 27 mmol/L (ref 22–32)
Calcium: 7.7 mg/dL — ABNORMAL LOW (ref 8.9–10.3)
Chloride: 110 mmol/L (ref 98–111)
Creatinine, Ser: 0.92 mg/dL (ref 0.44–1.00)
GFR, Estimated: >60 mL/min (ref >60)
Glucose, Bld: 207 mg/dL — ABNORMAL HIGH (ref 70–99)
Potassium: 4.3 mmol/L (ref 3.5–5.1)
Sodium: 144 mmol/L (ref 135–145)
Total Bilirubin: 0.5 mg/dL (ref 0.3–1.2)
Total Protein: 4.6 g/dL — ABNORMAL LOW (ref 6.5–8.1)

## 2021-01-08 LAB — CULTURE, BLOOD (ROUTINE X 2)
Culture: NO GROWTH
Culture: NO GROWTH
Special Requests: ADEQUATE
Special Requests: ADEQUATE

## 2021-01-08 LAB — GLUCOSE, CAPILLARY
Glucose-Capillary: 127 mg/dL — ABNORMAL HIGH (ref 70–99)
Glucose-Capillary: 134 mg/dL — ABNORMAL HIGH (ref 70–99)
Glucose-Capillary: 135 mg/dL — ABNORMAL HIGH (ref 70–99)
Glucose-Capillary: 177 mg/dL — ABNORMAL HIGH (ref 70–99)

## 2021-01-08 LAB — PHOSPHORUS: Phosphorus: 2.2 mg/dL — ABNORMAL LOW (ref 2.5–4.6)

## 2021-01-08 LAB — MAGNESIUM: Magnesium: 1.6 mg/dL — ABNORMAL LOW (ref 1.7–2.4)

## 2021-01-08 MED ORDER — SODIUM PHOSPHATES 45 MMOLE/15ML IV SOLN
10.0000 mmol | Freq: Once | INTRAVENOUS | Status: AC
  Administered 2021-01-08: 10 mmol via INTRAVENOUS
  Filled 2021-01-08: qty 3.33

## 2021-01-08 MED ORDER — INSULIN ASPART 100 UNIT/ML ~~LOC~~ SOLN
0.0000 [IU] | Freq: Four times a day (QID) | SUBCUTANEOUS | Status: DC
  Administered 2021-01-11 (×2): 1 [IU] via SUBCUTANEOUS

## 2021-01-08 MED ORDER — STERILE WATER FOR INJECTION IV SOLN
INTRAVENOUS | Status: AC
  Filled 2021-01-08: qty 480

## 2021-01-08 MED ORDER — TRAVASOL 10 % IV SOLN: INTRAVENOUS | Status: DC

## 2021-01-08 MED ORDER — MAGNESIUM SULFATE 2 GM/50ML IV SOLN
2.0000 g | Freq: Once | INTRAVENOUS | Status: AC
  Administered 2021-01-08: 2 g via INTRAVENOUS
  Filled 2021-01-08: qty 50

## 2021-01-08 MED ORDER — DEXTROSE-NACL 5-0.45 % IV SOLN: INTRAVENOUS | Status: AC

## 2021-01-08 NOTE — Progress Notes (Signed)
PHARMACY - TOTAL PARENTERAL NUTRITION CONSULT NOTE   Indication: Prolonged ileus  Patient Measurements: Height: 5' (152.4 cm) Weight: 54.4 kg (119 lb 14.9 oz) IBW/kg (Calculated) : 45.5 TPN AdjBW (KG): 54.4 Body mass index is 23.42 kg/m.   Assessment: 68 y/oF admitted with SBO, underwent exploratory laparotomy, lysis of adhesions, and small bowel resection on 01/07/2021. Pharmacy consulted today for TPN due to expected prolonged postop ileus.   Glucose / Insulin: CBGs 103-216 (goal < 150). 4 units insulin used via very sensitive SSI Electrolytes: Phos low at 2.2. Mag low at 1.6. All others, including Corrected Ca, WNL. Renal: SCr improved to 0.92. BUN 16.  Hepatic: AST/ALT WNL. Alk Phos slightly low. Tbili WNL. Prealbumin: 16.3 (4/13), 7.9 (4/16) Triglycercides: ordered for AM Intake / Output; MIVF: -1244mL UOP, 138mL NG output; I/O + 153mL; MIVF D5 1/2 NS with 16mEq KCl/L at 29mL/hour GI Imaging: 4/17 AXR: High-grade small bowel obstruction with no progression of enteric contrast. GI Surgeries / Procedures: 4/17 exploratory laparotomy, lysis of adhesions, small bowel resection   Central access: has implanted port TPN start date: 01/08/21  Nutritional Goals  RD recommendation pending  Current Nutrition:  NPO. Start TPN tonight.   Plan:  Now: Magnesium Sulfate 2g IV x 1 already given per MD. Sodium Phosphate 10 mmol IV x 1 At 1800: Start TPN at 52mL/hr Electrolytes in TPN: Na 34mEq/L, K 43mEq/L, Ca 56mEq/L, Mg 35mEq/L, and Phos 50mmol/L. Cl:Ac 1:1 Add standard MVI and trace elements to TPN Continue very sensitive SSI, but change CBG checks to q6h. Adjust as needed.   Remove KCl from MIVF. Change MIVF to D5 1/2 NS and reduce rate to 75mL/hr.  Monitor TPN labs on Mon/Thurs. CMET, Mag, Phos, Prealbumin, Triglycerides in AM.    Lindell Spar, PharmD, BCPS Clinical Pharmacist  01/08/2021,11:10 AM

## 2021-01-08 NOTE — Progress Notes (Signed)
1 Day Post-Op  Subjective: POD1. No nausea/vomiting. Afebrile, vitals stable.   Objective: Vital signs in last 24 hours: Temp:  [97.3 F (36.3 C)-99.1 F (37.3 C)] 97.9 F (36.6 C) (04/18 0518) Pulse Rate:  [73-88] 88 (04/18 0518) Resp:  [16-20] 16 (04/18 0518) BP: (117-146)/(54-73) 132/58 (04/18 0518) SpO2:  [100 %] 100 % (04/18 0518) Last BM Date: 01/08/21  Intake/Output from previous day: 04/17 0701 - 04/18 0700 In: 4231.9 [I.V.:3210.6; IV Piggyback:1021.3] Out: 4075 [Urine:1250; Emesis/NG output:150; Blood:25] Intake/Output this shift: No intake/output data recorded.  PE: General: resting comfortably, NAD HEENT: NG in place draining bilious fluid Resp: normal work of breathing CV: RRR Abdomen: soft, nondistended, nontender to palpation. Incision clean and dry, honeycomb dressing in place. Extremities: warm and well-perfused   Lab Results:  Recent Labs    01/07/21 0340 01/08/21 0355  WBC 7.6 11.4*  HGB 9.6* 8.2*  HCT 30.2* 25.8*  PLT 142* 133*   BMET Recent Labs    01/07/21 0340 01/08/21 0355  NA 147* 144  K 3.0* 4.3  CL 109 110  CO2 28 27  GLUCOSE 208* 207*  BUN 23 16  CREATININE 1.10* 0.92  CALCIUM 8.4* 7.7*   PT/INR No results for input(s): LABPROT, INR in the last 72 hours. CMP     Component Value Date/Time   NA 144 01/08/2021 0355   NA 139 09/01/2018 1550   NA 138 11/22/2013 1447   K 4.3 01/08/2021 0355   K 4.2 11/22/2013 1447   CL 110 01/08/2021 0355   CL 104 11/03/2012 1419   CO2 27 01/08/2021 0355   CO2 28 11/22/2013 1447   GLUCOSE 207 (H) 01/08/2021 0355   GLUCOSE 319 (H) 11/22/2013 1447   GLUCOSE 119 (H) 11/03/2012 1419   BUN 16 01/08/2021 0355   BUN 13 09/01/2018 1550   BUN 11.5 11/22/2013 1447   CREATININE 0.92 01/08/2021 0355   CREATININE 1.30 (H) 12/14/2020 1117   CREATININE 0.8 11/22/2013 1447   CALCIUM 7.7 (L) 01/08/2021 0355   CALCIUM 10.0 11/22/2013 1447   PROT 4.6 (L) 01/08/2021 0355   PROT 6.9 11/22/2013  1447   ALBUMIN 2.4 (L) 01/08/2021 0355   ALBUMIN 3.5 11/22/2013 1447   AST 18 01/08/2021 0355   AST 11 11/22/2013 1447   ALT 11 01/08/2021 0355   ALT 13 11/22/2013 1447   ALKPHOS 31 (L) 01/08/2021 0355   ALKPHOS 94 11/22/2013 1447   BILITOT 0.5 01/08/2021 0355   BILITOT 0.20 11/22/2013 1447   GFRNONAA >60 01/08/2021 0355   GFRNONAA 43 (L) 12/14/2020 1117   GFRAA >60 06/21/2020 1423   Lipase     Component Value Date/Time   LIPASE 15 07/17/2010 1628       Studies/Results: DG Abd 1 View  Result Date: 01/06/2021 CLINICAL DATA:  Confirm nasogastric tube placement. EXAM: ABDOMEN - 1 VIEW COMPARISON:  01/06/2021 at 10:06 a.m. FINDINGS: Nasogastric tube has been placed, tip overlying the level of the stomach. There is significant persistent dilatation of small bowel loops. Paucity of gas in the colon. Findings are consistent persistent small bowel obstruction. No free intraperitoneal air. IMPRESSION: Nasogastric tube to the stomach. Persistent high-grade small bowel obstruction. Electronically Signed   By: Nolon Nations M.D.   On: 01/06/2021 18:54   DG Abd Portable 1V  Result Date: 01/07/2021 CLINICAL DATA:  Small bowel obstruction EXAM: PORTABLE ABDOMEN - 1 VIEW COMPARISON:  Yesterday FINDINGS: No progression of enteric contrast which has become more dilute. There is  continued marked small bowel dilatation. Persistent contrast seen in the urinary bladder. The enteric tube is no longer seen. IMPRESSION: High-grade small bowel obstruction with no progression of enteric contrast. The enteric tube is no longer seen. Electronically Signed   By: Monte Fantasia M.D.   On: 01/07/2021 04:42   DG Abd Portable 1V  Result Date: 01/06/2021 CLINICAL DATA:  Small bowel obstruction. EXAM: PORTABLE ABDOMEN - 1 VIEW COMPARISON:  01/05/2021 FINDINGS: Persistent dilated loops of small bowel containing oral contrast. Bowel gas pattern has not changed. Small bowel loops measure up to 5.4 cm. Persistent  oral contrast in the stomach and no significant contrast in the colon. No significant gas in the colon. Limited evaluation for free air on the supine images. Multiple surgical clips in both inguinal regions. IMPRESSION: No significant change the bowel gas pattern. There continues to be markedly dilated loops of small bowel containing oral contrast. Findings are compatible with a high-grade small bowel obstruction. Electronically Signed   By: Markus Daft M.D.   On: 01/06/2021 11:06    Anti-infectives: Anti-infectives (From admission, onward)   Start     Dose/Rate Route Frequency Ordered Stop   01/04/21 1200  Ampicillin-Sulbactam (UNASYN) 3 g in sodium chloride 0.9 % 100 mL IVPB        3 g 200 mL/hr over 30 Minutes Intravenous Every 6 hours 01/04/21 0823     01/03/21 1600  Ampicillin-Sulbactam (UNASYN) 3 g in sodium chloride 0.9 % 100 mL IVPB  Status:  Discontinued        3 g 200 mL/hr over 30 Minutes Intravenous Every 6 hours 01/03/21 1400 01/03/21 1405   01/03/21 1600  Ampicillin-Sulbactam (UNASYN) 3 g in sodium chloride 0.9 % 100 mL IVPB  Status:  Discontinued        3 g 200 mL/hr over 30 Minutes Intravenous Every 12 hours 01/03/21 1405 01/04/21 0823   01/02/21 1230  cefTRIAXone (ROCEPHIN) 1 g in sodium chloride 0.9 % 100 mL IVPB        1 g 200 mL/hr over 30 Minutes Intravenous  Once 01/02/21 1227 01/02/21 1315       Assessment/Plan 75 yo female with SBO, POD1 s/p ex lap, lysis of adhesions and small bowel resection. - Expect prolonged postop ileus. Continue NG tube until return of bowel function. - Recommend discontinuing antibiotics - Begin TPN - Surgery will continue to follow closely    LOS: 4 days    Michaelle Birks, MD [facility] Surgery General, Hepatobiliary and Pancreatic Surgery 01/08/21 9:29 AM

## 2021-01-08 NOTE — Progress Notes (Signed)
PROGRESS NOTE  [name] DVV:[id number] DOB: [birth year]   PCP: Glenis Smoker, MD  Patient is from: Home  DOA: 01/02/2021 LOS: 4  Chief complaints: Nausea, vomiting and abdominal pain  Brief Narrative / Interim history: 75 year old F with PMH of dementia, CKD, CHB s/p PPM, DM-2, breast cancer and vulvar cancer presenting with nausea, vomiting and abdominal pain for over 24 hours after she had a seafood at a local restaurant.  She was admitted for profound hypokalemia, AKI and CT abdomen and pelvis concerning for enteritis.   Patient continued to have significant low abdominal pain.  Abdominal x-ray on 4/14 showed SBO.  General surgery consulted.  Started on SBO protocol with NG tube.  However, patient removed NG tube and refused reinsertion.  Repeat KUB with persistent SBO.  Patient underwent ex lap with lysis of adhesion and small bowel resection on 01/07/2021.  Started on TPN on 4/18.   Subjective: Seen and examined earlier this morning.  No major events overnight or this morning.  No family members at bedside.  Not a great historian.  She is oriented to self and "hospital".  She thinks she is in [place].  Reports abdominal pain but not able to rate her pain.  She also feels hungry.  She reports nausea but no vomiting.  She still has NG tube in place.  She thinks she is passing gas.   Objective: Vitals:   01/07/21 1334 01/07/21 1937 01/07/21 2333 01/08/21 0518  BP: 134/67 130/63 (!) 117/54 (!) 132/58  Pulse: 80 82 87 88  Resp: 18 16  16   Temp: (!) 97.5 F (36.4 C) 99.1 F (37.3 C)  97.9 F (36.6 C)  TempSrc: Oral Oral    SpO2: 100% 100%  100%  Weight:      Height:        Intake/Output Summary (Last 24 hours) at 01/08/2021 1309 Last data filed at 01/08/2021 1000 Gross per 24 hour  Intake 1707.34 ml  Output 1000 ml  Net 707.34 ml   Filed Weights   01/02/21 1717  Weight: 54.4 kg    Examination:  GENERAL: No apparent distress.  Nontoxic. HEENT: MMM.   Vision and hearing grossly intact.  NECK: Supple.  No apparent JVD.  RESP: On RA.  No IWOB.  Fair aeration bilaterally. CVS:  RRR. Heart sounds normal.  ABD/GI/GU: BS+. Abd soft.  Slightly tender.  Honeycomb dressing with scant blood staining distally MSK/EXT:  Moves extremities. No apparent deformity. No edema.  SKIN: no apparent skin lesion or wound NEURO: Awake and alert.  Oriented to self and "hospital".  No apparent focal neuro deficit. PSYCH: Calm. Normal affect.  Procedures:  4/17-ex lap, lysis of adhesion and small bowel resection  Microbiology summarized: COVID-19 and influenza PCR nonreactive. Urine culture NGTD. Blood culture pending  Assessment & Plan: Small bowel obstruction-persisted despite NG tube. Postop ileus -Ex lap, lysis of adhesion and small bowel resection on 4/17. -Analgesics and antiemetics -Prealbumin 7.9.  TPN started on 4/18. -Discontinue IV fluid  Intractable nausea, vomiting and abdominal pain: Initial CT showed enteritis. UA with pyuria but urine culture and blood cultures negative.  She is afebrile. Leukocytosis resolved.  GI symptoms improved. -Completed 5 days of IV Unasyn for possible enteritis. 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  Recent Labs    07/26/20 1359 10/11/20 1246 12/14/20 1117 01/02/21 0930 01/02/21 1520 01/03/21 0413 01/04/21 0425 01/05/21 0457 01/06/21  8676 01/07/21 0340 01/08/21 0355  BUN 15 25* 31* 31*  --  32* 34* 38* 34* 23 16  CREATININE 1.00 1.00 1.30* 1.09* 1.12* 1.19* 1.11* 1.09* 1.29* 1.10* 0.92  -Hold nephrotoxic meds -Recheck in the morning  Normocytic anemia: Hgb dropped about a gram postop. Recent Labs    10/11/20 1246 12/14/20 1039 01/02/21 0930 01/02/21 1520 01/03/21 0413 01/04/21 0425 01/05/21 0457 01/06/21 0317 01/07/21 0340 01/08/21 0355  HGB 8.5* 10.3* 10.6* 11.6* 14.4 12.6 10.9* 10.5* 9.6* 8.2*   -Continue monitoring  Diet-controlled diabetes with hyperglycemia and hyperlipidemia: A1c 6.3%. Recent Labs  Lab 01/07/21 1037 01/07/21 1644 01/07/21 2103 01/08/21 0755 01/08/21 1143  GLUCAP 103* 193* 216* 177* 134*  -Continue SSI and CBG monitoring  Hypernatremia: Na 148>> 144 -Discontinue IV fluid.  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 -Reorientation and delirium precautions  Hypokalemia: Likely due to GI loss on Maxzide.  Resolved.  Hypomagnesemia: Mg 1.6. -Replenish and recheck  CHB s/p PPM-slightly tachycardic. -IV metoprolol while n.p.o. for SBO.  History of breast cancer-seems to be remission. History of vulvar cancer-seems to be remission. -Outpatient follow-up  Thrombocytopenia:  Recent Labs  Lab 01/02/21 0930 01/02/21 1520 01/03/21 0413 01/04/21 0425 01/05/21 0457 01/06/21 0317 01/07/21 0340 01/08/21 0355  PLT 171 168 200 197 156 163 142* 133*  -Monitor   Goal of care: Patient seems to have advanced dementia.  Now with SBO and possible enteritis.  She looks frail.  Discussed CODE STATUS with patient's husband and son atbedside who prefers to continue full code. -Palliative medicine consulted.  Body mass index is 23.42 kg/m.         DVT prophylaxis:  enoxaparin (LOVENOX) injection 40 mg Start: 01/08/21 1000  Code Status: Full code Family Communication: Updated patient's son over the phone. Level of care: Med-Surg  Status is: Inpatient  Remains inpatient appropriate because:Persistent severe electrolyte disturbances, IV treatments appropriate due to intensity of illness or inability to take PO and Inpatient level of care appropriate due to severity of illness   Dispo: The patient is from: Home              Anticipated d/c is to: Home              Patient currently is not medically stable to d/c.   Difficult to place patient No           Consultants:  General  surgery Palliative medicine   Sch Meds:  Scheduled Meds: . Chlorhexidine Gluconate Cloth  6 each Topical Daily  . enoxaparin (LOVENOX) injection  40 mg Subcutaneous Q24H  . insulin aspart  0-6 Units Subcutaneous Q6H  . metoprolol tartrate  2.5 mg Intravenous Q6H  . mupirocin ointment  1 application Nasal BID  . pantoprazole (PROTONIX) IV  40 mg Intravenous Q24H   Continuous Infusions: . dexrose 5 % and 0.45 % NaCl with KCl 30 mEq/L 75 mL/hr at 01/08/21 0941  . dextrose 5 % and 0.45% NaCl    . sodium phosphate  Dextrose 5% IVPB    . TPN ADULT (ION)     PRN Meds:.morphine injection, ondansetron **OR** ondansetron (ZOFRAN) IV, phenol  Antimicrobials: Anti-infectives (From admission, onward)   Start     Dose/Rate Route Frequency Ordered Stop   01/04/21 1200  Ampicillin-Sulbactam (UNASYN) 3 g in sodium chloride 0.9 % 100 mL IVPB  Status:  Discontinued        3 g 200 mL/hr over 30 Minutes Intravenous Every  6 hours 01/04/21 0823 01/08/21 1041   01/03/21 1600  Ampicillin-Sulbactam (UNASYN) 3 g in sodium chloride 0.9 % 100 mL IVPB  Status:  Discontinued        3 g 200 mL/hr over 30 Minutes Intravenous Every 6 hours 01/03/21 1400 01/03/21 1405   01/03/21 1600  Ampicillin-Sulbactam (UNASYN) 3 g in sodium chloride 0.9 % 100 mL IVPB  Status:  Discontinued        3 g 200 mL/hr over 30 Minutes Intravenous Every 12 hours 01/03/21 1405 01/04/21 0823   01/02/21 1230  cefTRIAXone (ROCEPHIN) 1 g in sodium chloride 0.9 % 100 mL IVPB        1 g 200 mL/hr over 30 Minutes Intravenous  Once 01/02/21 1227 01/02/21 1315       I have personally reviewed the following labs and images: CBC: Recent Labs  Lab 01/02/21 0930 01/02/21 1520 01/04/21 0425 01/05/21 0457 01/06/21 0317 01/07/21 0340 01/08/21 0355  WBC 9.4   < > 20.8* 13.6* 12.1* 7.6 11.4*  NEUTROABS 8.1*  --  17.7* 11.1*  --   --   --   HGB 10.6*   < > 12.6 10.9* 10.5* 9.6* 8.2*  HCT 32.6*   < > 38.5 33.8* 31.8* 30.2* 25.8*  MCV 90.1    < > 90.0 91.8 91.6 92.9 92.8  PLT 171   < > 197 156 163 142* 133*   < > = values in this interval not displayed.   BMP &GFR Recent Labs  Lab 01/04/21 0425 01/05/21 0457 01/06/21 0317 01/07/21 0340 01/08/21 0355  NA 144 143 148* 147* 144  K 3.8 3.6 3.5 3.0* 4.3  CL 106 108 111 109 110  CO2 29 26 25 28 27   GLUCOSE 191* 135* 138* 208* 207*  BUN 34* 38* 34* 23 16  CREATININE 1.11* 1.09* 1.29* 1.10* 0.92  CALCIUM 9.7 9.1 8.9 8.4* 7.7*  MG 2.0 2.0 2.1 1.9 1.6*  PHOS 2.8  --  3.6 2.1* 2.2*   Estimated Creatinine Clearance: 37.4 mL/min (by C-G formula based on SCr of 0.92 mg/dL). Liver & Pancreas: Recent Labs  Lab 01/03/21 0413 01/04/21 0425 01/05/21 0457 01/06/21 0317 01/07/21 0340 01/08/21 0355  AST 23 20 17 18   --  18  ALT 15 13 13 13   --  11  ALKPHOS 70 62 50 46  --  31*  BILITOT 0.5 0.9 0.8 0.8  --  0.5  PROT 6.9 6.5 5.9* 5.7*  --  4.6*  ALBUMIN 3.5 3.4* 3.1* 3.0* 2.7* 2.4*   No results for input(s): LIPASE, AMYLASE in the last 168 hours. No results for input(s): AMMONIA in the last 168 hours. Diabetic: No results for input(s): HGBA1C in the last 72 hours. Recent Labs  Lab 01/07/21 1037 01/07/21 1644 01/07/21 2103 01/08/21 0755 01/08/21 1143  GLUCAP 103* 193* 216* 177* 134*   Cardiac Enzymes: No results for input(s): CKTOTAL, CKMB, CKMBINDEX, TROPONINI in the last 168 hours. No results for input(s): PROBNP in the last 8760 hours. Coagulation Profile: No results for input(s): INR, PROTIME in the last 168 hours. Thyroid Function Tests: No results for input(s): TSH, T4TOTAL, FREET4, T3FREE, THYROIDAB in the last 72 hours. Lipid Profile: No results for input(s): CHOL, HDL, LDLCALC, TRIG, CHOLHDL, LDLDIRECT in the last 72 hours. Anemia Panel: No results for input(s): VITAMINB12, FOLATE, FERRITIN, TIBC, IRON, RETICCTPCT in the last 72 hours. Urine analysis:    Component Value Date/Time   COLORURINE YELLOW 01/02/2021 1014   [place]  01/02/2021 1014    LABSPEC 1.018 01/02/2021 1014   PHURINE 5.0 01/02/2021 1014   GLUCOSEU 50 (A) 01/02/2021 1014   HGBUR NEGATIVE 01/02/2021 [place] 01/02/2021 1014   KETONESUR 20 (A) 01/02/2021 1014   PROTEINUR NEGATIVE 01/02/2021 1014   NITRITE NEGATIVE 01/02/2021 1014   LEUKOCYTESUR LARGE (A) 01/02/2021 1014   Sepsis Labs: Invalid input(s): PROCALCITONIN, Emanuel  Microbiology: Recent Results (from the past 240 hour(s))  Urine Culture     Status: None   Collection Time: 01/02/21 10:14 AM   Specimen: Urine, Catheterized  Result Value Ref Range Status   Specimen Description   Final    URINE, CATHETERIZED Performed at [facility] [address] [place], [place] [address]    Special Requests   Final    NONE Performed at [facility], [place] [address] [place], [place] [address]    Culture   Final    NO GROWTH Performed at [facility], [place] [address] [place], [place] [address]    Report Status 01/03/2021 FINAL  Final  Resp Panel by RT-PCR (Flu A&B, Covid) Nasopharyngeal Swab     Status: None   Collection Time: 01/02/21 10:15 AM   Specimen: Nasopharyngeal Swab; Nasopharyngeal(NP) swabs in vial transport medium  Result Value Ref Range Status   SARS Coronavirus 2 by RT PCR NEGATIVE NEGATIVE Final    Comment: (NOTE) SARS-CoV-2 target nucleic acids are NOT DETECTED.  The SARS-CoV-2 RNA is generally detectable in upper respiratory specimens during the acute phase of infection. The lowest concentration of SARS-CoV-2 viral copies this assay can detect is 138 copies/mL. A negative result does not preclude SARS-Cov-2 infection and should not be used as the sole basis for treatment or other patient management decisions. A negative result may occur with  improper specimen collection/handling, submission of specimen other than nasopharyngeal swab, presence of viral mutation(s) within the areas targeted by this assay, and  inadequate number of viral copies(<138 copies/mL). A negative result must be combined with clinical observations, patient history, and epidemiological information. The expected result is Negative.  Fact Sheet for Patients:  EntrepreneurPulse.com.au  Fact Sheet for Healthcare Providers:  IncredibleEmployment.be  This test is no t yet approved or cleared by the [place] FDA and  has been authorized for detection and/or diagnosis of SARS-CoV-2 by FDA under an Emergency Use Authorization (EUA). This EUA will remain  in effect (meaning this test can be used) for the duration of the COVID-19 declaration under Section 564(b)(1) of the Act, 21 U.S.C.section 360bbb-3(b)(1), unless the authorization is terminated  or revoked sooner.       Influenza A by PCR NEGATIVE NEGATIVE Final   Influenza B by PCR NEGATIVE NEGATIVE Final    Comment: (NOTE) The Xpert Xpress SARS-CoV-2/FLU/RSV plus assay is intended as an aid in the diagnosis of influenza from Nasopharyngeal swab specimens and should not be used as a sole basis for treatment. Nasal washings and aspirates are unacceptable for Xpert Xpress SARS-CoV-2/FLU/RSV testing.  Fact Sheet for Patients: EntrepreneurPulse.com.au  Fact Sheet for Healthcare Providers: IncredibleEmployment.be  This test is not yet approved or cleared by the [place] FDA and has been authorized for detection and/or diagnosis of SARS-CoV-2 by FDA under an Emergency Use Authorization (EUA). This EUA will remain in effect (meaning this test can be used) for the duration of the COVID-19 declaration under Section 564(b)(1) of the Act, 21 U.S.C. section 360bbb-3(b)(1), unless the authorization is terminated or revoked.  Performed  at [facility], [place] [address] [place], [place] [address]   Culture, blood (routine x 2)     Status: None   Collection Time: 01/03/21  3:32  PM   Specimen: BLOOD  Result Value Ref Range Status   Specimen Description   Final    BLOOD RIGHT ANTECUBITAL Performed at [facility] [address], [place], [place] [address]    Special Requests   Final    BOTTLES DRAWN AEROBIC ONLY Blood Culture adequate volume Performed at [facility] [address], [place], [place] [address]    Culture   Final    NO GROWTH 5 DAYS Performed at [facility], [place] [address], [place], [place] [address]    Report Status 01/08/2021 FINAL  Final  Culture, blood (routine x 2)     Status: None   Collection Time: 01/03/21  3:32 PM   Specimen: BLOOD LEFT HAND  Result Value Ref Range Status   Specimen Description   Final    BLOOD LEFT HAND Performed at [facility] [address], [place], [place] [address]    Special Requests   Final    BOTTLES DRAWN AEROBIC ONLY Blood Culture adequate volume Performed at [facility] [address] [place], [place] [address]    Culture   Final    NO GROWTH 5 DAYS Performed at [facility], [place] [address], [place], [place] [address]    Report Status 01/08/2021 FINAL  Final  Surgical pcr screen     Status: Abnormal   Collection Time: 01/07/21  7:52 AM   Specimen: Nasal Mucosa; Nasal Swab  Result Value Ref Range Status   MRSA, PCR POSITIVE (A) NEGATIVE Final    Comment: RESULT CALLED TO, READ BACK BY AND VERIFIED WITH: JT AT 1013 ON 01/07/2021 BY MOSLEY,J    Staphylococcus aureus POSITIVE (A) NEGATIVE Final    Comment: (NOTE) The Xpert SA Assay (FDA approved for NASAL specimens in patients 76 years of age and older), is one component of a comprehensive surveillance program. It is not intended to diagnose infection nor to guide or monitor treatment. Performed at [facility], [place] [address] [place], [place] [address]     Radiology Studies: No results found.  Trystin Hargrove T. [place]  If 7PM-7AM, please contact night-coverage [email or web address] 01/08/2021, 1:09 PM

## 2021-01-08 NOTE — Progress Notes (Signed)
Initial Nutrition Assessment  DOCUMENTATION CODES:  Non-severe (moderate) malnutrition in context of chronic illness  INTERVENTION:  TPN per pharmacy.  Monitor magnesium, potassium, and phosphorus daily for at least 3 days, MD to replete as needed, as pt is at risk for refeeding syndrome given moderate malnutrition diagnosis.  NUTRITION DIAGNOSIS:  Moderate Malnutrition related to chronic illness (dementia, T2DM, CKD) as evidenced by percent weight loss,mild fat depletion,moderate fat depletion,mild muscle depletion,moderate muscle depletion.  GOAL:  Patient will meet greater than or equal to 90% of their needs  MONITOR:  Labs,Weight trends,I & O's,Skin,Diet advancement  REASON FOR ASSESSMENT:  Consult New TPN/TNA  ASSESSMENT:  75 yo female with a PMH of dementia, CKD, CHB s/p PPM, T2DM, breast cancer, and vulvar cancer who presents with profound hypokalemia, AKI, and CT of abdomen/pelvis concerning for enteritis. 4/14 - abdominal x-ray showed SBO; NG tube placed 4/17 - ex lap w/ lysis of adhesion and small bowel resection  Spoke with pt and husband at bedside. Husband reports that up until Tuesday morning, she was eating okay. She ate small amounts throughout the day, usually fruits, vegetables, and meats. He also reports it takes her a while to finish meals.  Husband endorses weight loss over the past 6-7 months. He reports her weight was up to 200 at one point. Over the last 6 months, per Epic, pt has lost 9.5% (12.5 lbs) of her body weight, which is significant and severe. Pt likely a refeeding risk.  On exam, pt has a few depletions in muscle and fat stores. Pt moderately malnourished, at risk for severe.  Pt to start TPN today via chemo port from 05/2020. Recommend starting at a low rate and advancing slowly given refeeding risk.  Medications: SSI, Protonix, IVF w/ KCl 30 mEq @ 75 ml/hr Labs: reviewed; Phos 2.2, Mag 1.6, CBG 103-216 HbA1c: 6.3% (12/2020)  NUTRITION -  FOCUSED PHYSICAL EXAM: Flowsheet Row Most Recent Value  Orbital Region Moderate depletion  Upper Arm Region Mild depletion  Thoracic and Lumbar Region Moderate depletion  Buccal Region Mild depletion  Temple Region Moderate depletion  Clavicle Bone Region Mild depletion  Clavicle and Acromion Bone Region Mild depletion  Scapular Bone Region Unable to assess  Dorsal Hand Mild depletion  Patellar Region Moderate depletion  Anterior Thigh Region Moderate depletion  Posterior Calf Region Moderate depletion  Edema (RD Assessment) None  Hair Reviewed  Eyes Reviewed  Mouth Reviewed  Skin Reviewed  Nails Reviewed     Diet Order:   Diet Order            Diet NPO time specified  Diet effective now                EDUCATION NEEDS:  Education needs have been addressed  Skin:  Skin Assessment: Skin Integrity Issues: Skin Integrity Issues:: Incisions Incisions: Abdomen, closed  Last BM:  01/08/21  Height:  Ht Readings from Last 1 Encounters:  01/02/21 5' (1.524 m)   Weight:  Wt Readings from Last 1 Encounters:  01/02/21 54.4 kg   Ideal Body Weight:  45.5 kg  BMI:  Body mass index is 23.42 kg/m.  Estimated Nutritional Needs:  Kcal:  4696-2952 Protein:  65-80 grams Fluid:  >1.75 L  Derrel Nip, RD, LDN Registered Dietitian After Hours/Weekend Pager # in [place]

## 2021-01-08 NOTE — Care Management Important Message (Signed)
Important Message  Patient Details IM Letter given to the Patient. Name: [name] MRN: [id number] Date of Birth: 04/28/1946   Medicare Important Message Given:  Yes     Kerin Salen 01/08/2021, 1:46 PM

## 2021-01-08 NOTE — Progress Notes (Signed)
Daily Progress Note   Patient Name: [name]       Date: 01/08/2021 DOB: 10/19/1945  Age: 75 y.o. MRN#: [id number] Attending Physician: Mercy Riding, MD Primary Care Physician: Glenis Smoker, MD Admit Date: 01/02/2021  Reason for Consultation/Follow-up: Establishing goals of care  Subjective: Patient is awake resting in bed.  She thinks she is currently in [place].  She has NG tube in place.  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  She recognizes husband in the room.  She denies abdominal discomfort at present.  Started on TPN.  Length of Stay: 4  Current Medications: Scheduled Meds:  . Chlorhexidine Gluconate Cloth  6 each Topical Daily  . enoxaparin (LOVENOX) injection  40 mg Subcutaneous Q24H  . insulin aspart  0-6 Units Subcutaneous Q6H  . metoprolol tartrate  2.5 mg Intravenous Q6H  . mupirocin ointment  1 application Nasal BID  . pantoprazole (PROTONIX) IV  40 mg Intravenous Q24H    Continuous Infusions: . dexrose 5 % and 0.45 % NaCl with KCl 30 mEq/L 75 mL/hr at 01/08/21 0941  . dextrose 5 % and 0.45% NaCl    . sodium phosphate  Dextrose 5% IVPB    . TPN ADULT (ION)      PRN Meds: morphine injection, ondansetron **OR** ondansetron (ZOFRAN) IV, phenol  Physical Exam         Elderly appearing lady resting in bed Has NG tube in Is able to move extremities Oriented to self, recognizes husband in the room answers a few questions appropriately Regular work of breathing  Vital Signs: BP (!) 133/59 (BP Location: Right Arm)   Pulse 78   Temp 98.5 F (36.9 C) (Oral)   Resp 18   Ht 5' (1.524 m)   Wt 54.4 kg   SpO2 100%   BMI 23.42 kg/m  SpO2: SpO2: 100 % O2 Device: O2 Device: Room Air O2 Flow Rate: O2 Flow Rate  (L/min): 2 L/min  Intake/output summary:   Intake/Output Summary (Last 24 hours) at 01/08/2021 1400 Last data filed at 01/08/2021 1000 Gross per 24 hour  Intake 1707.34 ml  Output 1000 ml  Net 707.34 ml   LBM: Last BM Date: 01/08/21 Baseline Weight: Weight: 54.4 kg Most recent weight: Weight: 54.4 kg  Performance scale 40%. Palliative Assessment/Data:      Patient Active Problem List   Diagnosis Date Noted  . SBO (small bowel obstruction) ([place]) 01/04/2021  . Intractable vomiting with nausea, unspecified vomiting type 01/02/2021  . Age-related cognitive decline 12/14/2020  . 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 Family history of prostate cancer   . Pancytopenia, acquired ([place]) 07/27/2020  . Thrombocytopenia ([place]) 07/13/2020  . Other constipation 07/06/2020  . Anemia due to antineoplastic chemotherapy 06/22/2020  . Surgical wound, non healing 06/15/2020  . Cancer associated pain 06/15/2020  . Goals of care, counseling/discussion 06/07/2020  . Weight loss, abnormal 06/06/2020  . Complete heart block ([place]) 05/15/2020  . Pacemaker 05/15/2020  . Memory loss 09/03/2018  . Bradycardia 07/15/2018  . Vulval lesion 02/01/2016  . Vulvar cancer ([place]) 01/10/2016  . 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 DIABETES, TYPE 2 11/28/2008  . COUGH 11/28/2008  . NEOPLASM, MALIGNANT, BREAST, HX OF 11/28/2008    Palliative Care Assessment & Plan   Patient Profile:   75 year old F with PMH of dementia, CKD, CHB s/p PPM, DM-2, breast cancer and vulvar cancer presenting with nausea, vomiting and abdominal pain for over 24 hours after she had a seafood at a [employer].  She was admitted for profound hypokalemia, AKI and CT abdomen and pelvis concerning for enteritis.   Patient continued to have significant low abdominal pain.  Abdominal x-ray on  4/14 showed SBO.  General surgery consulted.  Started on SBO protocol with NG tube.  However, patient removed NG tube and refused reinsertion.  Repeat KUB with persistent SBO.  Patient underwent ex lap with lysis of adhesion and small bowel resection on 01/07/2021.  Started on TPN on 4/18.   Recommendations/Plan: Full code/full scope for now.  Monitor hospital course and overall disease trajectory of illness.  Palliative to follow.  Goals of Care and Additional Recommendations: Limitations on Scope of Treatment: Full Scope Treatment  Code Status:    Code Status Orders  (From admission, onward)         Start     Ordered   01/02/21 1517  Full code  Continuous        01/02/21 1517        Code Status History    Date Active Date Inactive Code Status Order ID Comments User Context   07/15/2018 2012 07/18/2018 1600 Full Code [id number]  Daune Perch, NP ED   Advance Care Planning Activity    Advance Directive Documentation   Flowsheet Row Most Recent Value  Type of Advance Directive Healthcare Power of Attorney  Pre-existing out of facility DNR order (yellow form or pink MOST form) --  "MOST" Form in Place? --      Prognosis:  Unable to determine  Discharge Planning: To Be Determined  Care plan was discussed with patient and husband who is at the bedside.  Thank you for allowing the Palliative Medicine Team to assist in the care of this patient.   Time In: 12 Time Out: 12.25 Total Time 25 Prolonged Time Billed  no       Greater than 50%  of this time was spent counseling and coordinating care related to the above assessment and plan.  Loistine Chance, MD  Please contact Palliative Medicine Team phone at [phone] for questions and concerns.

## 2021-01-08 NOTE — Progress Notes (Addendum)
Physical Therapy Treatment Patient Details Name: [name] MRN: [id number] DOB: [birth year] Today's Date: 01/08/2021    History of Present Illness Patient is a 75 year old female PMH complete heart block status post pacemaker placement, diet controlled diabetes, hyperlipidemia history of breast cancer and vulvar cancer, advanced vascular dementia admitted 4/12 with N/V and abdominal pain. s/p ex lap SB resection 01/07/21    PT Comments    Progressing with mobility. Moderate pain with activity. RN disconnected pt from wall suction for ambulation with PT.   Follow Up Recommendations  Home health PT;Supervision/Assistance - 24 hour     Equipment Recommendations  None recommended by PT    Recommendations for Other Services       Precautions / Restrictions Precautions Precautions: Fall Precaution Comments: NG tube Restrictions Weight Bearing Restrictions: No    Mobility  Bed Mobility Overal bed mobility: Needs Assistance Bed Mobility: Supine to Sit;Sit to Supine     Supine to sit: HOB elevated;Mod assist Sit to supine: Mod assist;HOB elevated   General bed mobility comments: Assist for trunk and bil LEs. Increased time. Cues required.    Transfers Overall transfer level: Needs assistance Equipment used: Rolling walker (2 wheeled) Transfers: Sit to/from Stand Sit to Stand: Min assist         General transfer comment: A to power up, steady, control descent. Cues for safety.  Ambulation/Gait Ambulation/Gait assistance: Min assist Gait Distance (Feet): 200 Feet Assistive device: Rolling walker (2 wheeled) Gait Pattern/deviations: Step-through pattern;Decreased stride length;Trunk flexed     General Gait Details: A to stabilize throughout distance. A for RW management. Cues for safety.   Stairs             Wheelchair Mobility    Modified Rankin (Stroke Patients Only)       Balance Overall balance assessment: Needs assistance          Standing balance support: Bilateral upper extremity supported Standing balance-Leahy Scale: Poor                              Cognition Arousal/Alertness: Awake/alert Behavior During Therapy: Flat affect Overall Cognitive Status: History of cognitive impairments - at baseline                                 General Comments: per chart patient with vascular dementia, follows directions appropriately, pt poor historian and oriented only to self.      Exercises      General Comments        Pertinent Vitals/Pain Pain Assessment: Faces Faces Pain Scale: Hurts even more Pain Location: abdomen Pain Descriptors / Indicators: Sore;Grimacing;Discomfort Pain Intervention(s): Limited activity within patient's tolerance;Monitored during session;Repositioned    Home Living                      Prior Function            PT Goals (current goals can now be found in the care plan section) Progress towards PT goals: Progressing toward goals    Frequency    Min 3X/week      PT Plan Current plan remains appropriate    Co-evaluation              AM-PAC PT "6 Clicks" Mobility   Outcome Measure  Help needed turning from your back to your side while in  a flat bed without using bedrails?: A Lot Help needed moving from lying on your back to sitting on the side of a flat bed without using bedrails?: A Lot Help needed moving to and from a bed to a chair (including a wheelchair)?: A Little Help needed standing up from a chair using your arms (e.g., wheelchair or bedside chair)?: A Little Help needed to walk in hospital room?: A Little Help needed climbing 3-5 steps with a railing? : A Lot 6 Click Score: 15    End of Session   Activity Tolerance: Patient tolerated treatment well Patient left: in bed;with call bell/phone within reach;with bed alarm set  Nursing communication: Made RN aware that pt was back to bed for NG tube reconnection to  wall suction PT Visit Diagnosis: Muscle weakness (generalized) (M62.81);Difficulty in walking, not elsewhere classified (R26.2)     Time: 5844-1712 PT Time Calculation (min) (ACUTE ONLY): 36 min  Charges:  $Gait Training: 23-37 mins                         Doreatha Massed, PT Acute Rehabilitation  Office: [phone] Pager: [phone]

## 2021-01-09 LAB — CBC
HCT: 27.4 % — ABNORMAL LOW (ref 36.0–46.0)
Hemoglobin: 8.5 g/dL — ABNORMAL LOW (ref 12.0–15.0)
MCH: 29.7 pg (ref 26.0–34.0)
MCHC: 31 g/dL (ref 30.0–36.0)
MCV: 95.8 fL (ref 80.0–100.0)
Platelets: 124 10*3/uL — ABNORMAL LOW (ref 150–400)
RBC: 2.86 MIL/uL — ABNORMAL LOW (ref 3.87–5.11)
RDW: 13.8 % (ref 11.5–15.5)
WBC: 7.5 10*3/uL (ref 4.0–10.5)
nRBC: 0 % (ref 0.0–0.2)

## 2021-01-09 LAB — MAGNESIUM: Magnesium: 2.2 mg/dL (ref 1.7–2.4)

## 2021-01-09 LAB — DIFFERENTIAL
Abs Immature Granulocytes: 0.12 10*3/uL — ABNORMAL HIGH (ref 0.00–0.07)
Basophils Absolute: 0 10*3/uL (ref 0.0–0.1)
Basophils Relative: 0 %
Eosinophils Absolute: 0 10*3/uL (ref 0.0–0.5)
Eosinophils Relative: 0 %
Immature Granulocytes: 2 %
Lymphocytes Relative: 15 %
Lymphs Abs: 1.2 10*3/uL (ref 0.7–4.0)
Monocytes Absolute: 0.6 10*3/uL (ref 0.1–1.0)
Monocytes Relative: 8 %
Neutro Abs: 5.6 10*3/uL (ref 1.7–7.7)
Neutrophils Relative %: 75 %

## 2021-01-09 LAB — COMPREHENSIVE METABOLIC PANEL
ALT: 12 U/L (ref 0–44)
AST: 22 U/L (ref 15–41)
Albumin: 2.6 g/dL — ABNORMAL LOW (ref 3.5–5.0)
Alkaline Phosphatase: 35 U/L — ABNORMAL LOW (ref 38–126)
Anion gap: 7 (ref 5–15)
BUN: 16 mg/dL (ref 8–23)
CO2: 24 mmol/L (ref 22–32)
Calcium: 8.2 mg/dL — ABNORMAL LOW (ref 8.9–10.3)
Chloride: 109 mmol/L (ref 98–111)
Creatinine, Ser: 1 mg/dL (ref 0.44–1.00)
GFR, Estimated: 58 mL/min — ABNORMAL LOW (ref >60)
Glucose, Bld: 142 mg/dL — ABNORMAL HIGH (ref 70–99)
Potassium: 4 mmol/L (ref 3.5–5.1)
Sodium: 140 mmol/L (ref 135–145)
Total Bilirubin: 0.4 mg/dL (ref 0.3–1.2)
Total Protein: 5.1 g/dL — ABNORMAL LOW (ref 6.5–8.1)

## 2021-01-09 LAB — GLUCOSE, CAPILLARY
Glucose-Capillary: 123 mg/dL — ABNORMAL HIGH (ref 70–99)
Glucose-Capillary: 125 mg/dL — ABNORMAL HIGH (ref 70–99)
Glucose-Capillary: 106 mg/dL — ABNORMAL HIGH (ref 70–99)
Glucose-Capillary: 127 mg/dL — ABNORMAL HIGH (ref 70–99)
Glucose-Capillary: 64 mg/dL — ABNORMAL LOW (ref 70–99)

## 2021-01-09 LAB — PHOSPHORUS: Phosphorus: 2.5 mg/dL (ref 2.5–4.6)

## 2021-01-09 LAB — TRIGLYCERIDES: Triglycerides: 79 mg/dL (ref <150)

## 2021-01-09 LAB — SURGICAL PATHOLOGY

## 2021-01-09 LAB — PREALBUMIN: Prealbumin: 7.1 mg/dL — ABNORMAL LOW (ref 18–38)

## 2021-01-09 MED ORDER — TRAVASOL 10 % IV SOLN
INTRAVENOUS | Status: AC
  Filled 2021-01-09: qty 633.6

## 2021-01-09 MED ORDER — DEXTROSE-NACL 5-0.45 % IV SOLN: INTRAVENOUS | Status: DC

## 2021-01-09 MED ORDER — SODIUM CHLORIDE 0.9% FLUSH: 10.0000 mL | INTRAVENOUS | Status: DC | PRN

## 2021-01-09 NOTE — Progress Notes (Addendum)
PHARMACY - TOTAL PARENTERAL NUTRITION CONSULT NOTE   Indication: Prolonged ileus  Patient Measurements: Height: 5' (152.4 cm) Weight: 54.4 kg (119 lb 14.9 oz) IBW/kg (Calculated) : 45.5 TPN AdjBW (KG): 54.4 Body mass index is 23.42 kg/m.   Assessment: 34 y/oF admitted with SBO, underwent exploratory laparotomy, lysis of adhesions, and small bowel resection on 01/07/2021. Pharmacy consulted today for TPN due to expected prolonged postop ileus.   Glucose / Insulin: CBGs 123-177 (goal < 150). 1 unit insulin used via very sensitive SSI Electrolytes: All, including Corrected Calcium, now WNL . Renal: SCr 1. BUN 16.  Hepatic: AST/ALT WNL. Alk Phos slightly low. Tbili WNL. Prealbumin: 16.3 (4/13), 7.9 (4/16), 7.1 (4/19) Triglycercides: 79 (4/19) Intake / Output; MIVF: -132mL UOP, 382mL NG output; I/O + 730mL; MIVF D5 1/2 NS at 64mL/hour GI Imaging: 4/17 AXR: High-grade small bowel obstruction with no progression of enteric contrast. GI Surgeries / Procedures: 4/17 exploratory laparotomy, lysis of adhesions, small bowel resection   Central access: has implanted port TPN start date: 01/08/21  Nutritional Goals  RD recommendations 4/18: Kcal:  1750-1950 Protein:  65-80 grams Fluid:  >1.75 L  Goal TPN rate is 75 mL/hr (provides 79g of protein and 1836 kcals per day)  Current Nutrition:  NPO, TPN   Plan:  At 1800: Increase TPN to 34mL/hr.  Electrolytes in TPN: Na 55mEq/L, K 71mEq/L, Ca 57mEq/L, Mg 3mEq/L, and Phos 44mmol/L. Cl:Ac 1:1 Add standard MVI and trace elements to TPN. Continue very sensitive SSI q6h. Adjust as needed.   Decrease rate of MIVF D5 1/2 NS to 66mL/hr.  Monitor TPN labs on Mon/Thurs. CMET, Mag, Phos in AM.     Lindell Spar, PharmD, BCPS Clinical Pharmacist  01/09/2021,10:47 AM

## 2021-01-09 NOTE — Plan of Care (Signed)
  Problem: Nutrition: Goal: Adequate nutrition will be maintained Outcome: Progressing   

## 2021-01-09 NOTE — Progress Notes (Signed)
    2 Days Post-Op  Subjective: Patient reports she is passing flatus. Afebrile, vitals stable. Total NG output 350.   Objective: Vital signs in last 24 hours: Temp:  [98 F (36.7 C)-98.5 F (36.9 C)] 98 F (36.7 C) (04/19 0554) Pulse Rate:  [70-81] 70 (04/19 0554) Resp:  [18] 18 (04/19 0554) BP: (133-169)/(59-87) 169/87 (04/19 0554) SpO2:  [96 %-100 %] 96 % (04/19 0554) Last BM Date: 01/08/21  Intake/Output from previous day: 04/18 0701 - 04/19 0700 In: 2390.1 [I.V.:2186.1; IV Piggyback:204.1] Out: 1650 [Urine:1300; Emesis/NG output:350] Intake/Output this shift: No intake/output data recorded.  PE: General: resting comfortably, NAD HEENT: NG in place draining clear gastric contents, nonbilious. Resp: normal work of breathing CV: RRR Abdomen: soft, nondistended, nontender to palpation. Incision clean and dry, honeycomb dressing in place. Extremities: warm and well-perfused   Lab Results:  Recent Labs    01/08/21 0355 01/09/21 0447  WBC 11.4* 7.5  HGB 8.2* 8.5*  HCT 25.8* 27.4*  PLT 133* 124*   BMET Recent Labs    01/08/21 0355 01/09/21 0447  NA 144 140  K 4.3 4.0  CL 110 109  CO2 27 24  GLUCOSE 207* 142*  BUN 16 16  CREATININE 0.92 1.00  CALCIUM 7.7* 8.2*   PT/INR No results for input(s): LABPROT, INR in the last 72 hours. CMP     Component Value Date/Time   NA 140 01/09/2021 0447   NA 139 09/01/2018 1550   NA 138 11/22/2013 1447   K 4.0 01/09/2021 0447   K 4.2 11/22/2013 1447   CL 109 01/09/2021 0447   CL 104 11/03/2012 1419   CO2 24 01/09/2021 0447   CO2 28 11/22/2013 1447   GLUCOSE 142 (H) 01/09/2021 0447   GLUCOSE 319 (H) 11/22/2013 1447   GLUCOSE 119 (H) 11/03/2012 1419   BUN 16 01/09/2021 0447   BUN 13 09/01/2018 1550   BUN 11.5 11/22/2013 1447   CREATININE 1.00 01/09/2021 0447   CREATININE 1.30 (H) 12/14/2020 1117   CREATININE 0.8 11/22/2013 1447   CALCIUM 8.2 (L) 01/09/2021 0447   CALCIUM 10.0 11/22/2013 1447   PROT 5.1 (L)  01/09/2021 0447   PROT 6.9 11/22/2013 1447   ALBUMIN 2.6 (L) 01/09/2021 0447   ALBUMIN 3.5 11/22/2013 1447   AST 22 01/09/2021 0447   AST 11 11/22/2013 1447   ALT 12 01/09/2021 0447   ALT 13 11/22/2013 1447   ALKPHOS 35 (L) 01/09/2021 0447   ALKPHOS 94 11/22/2013 1447   BILITOT 0.4 01/09/2021 0447   BILITOT 0.20 11/22/2013 1447   GFRNONAA 58 (L) 01/09/2021 0447   GFRNONAA 43 (L) 12/14/2020 1117   GFRAA >60 06/21/2020 1423   Lipase     Component Value Date/Time   LIPASE 15 07/17/2010 1628        Assessment/Plan 75 yo female with SBO, POD2 s/p ex lap, lysis of adhesions and small bowel resection. - Decreased NG output, no longer bilious, patient is passing some flatus. Will remove NG today. - Keep NPO except sips for now - Continue TPN - Surgery will continue to follow closely    LOS: 5 days    Michaelle Birks, MD [facility] General, Hepatobiliary and Pancreatic Surgery 01/09/21 8:11 AM

## 2021-01-09 NOTE — Progress Notes (Signed)
Chaplain engaged in an initial visit with Nadezhda providing her space to talk.  She talked about her faith and trusting God, her children, school, and her husband.  She acknowledged that is forgetful and will probably not remember our conversation once I have left.  She also stated that she used to cry a lot about what has been happening but that she doesn't cry as much now. Chaplain offered ministries of listening and presence.     Chaplain will follow-up as needed.    01/09/21 1500  Clinical Encounter Type  Visited With Patient  Visit Type Initial

## 2021-01-09 NOTE — Progress Notes (Signed)
PROGRESS NOTE  LEEANNA SLABY GYI:[id number] DOB: 1946/07/14   PCP: Glenis Smoker, MD  Patient is from: Home  DOA: 01/02/2021 LOS: 5  Chief complaints: Nausea, vomiting and abdominal pain  Brief Narrative / Interim history: 75 year old F with PMH of dementia, CKD, CHB s/p PPM, DM-2, breast cancer and vulvar cancer presenting with nausea, vomiting and abdominal pain for over 24 hours after she had a seafood at a local restaurant.  She was admitted for profound hypokalemia, AKI and CT abdomen and pelvis concerning for enteritis.   Patient continued to have significant low abdominal pain.  Abdominal x-ray on 4/14 showed SBO.  General surgery consulted.  Started on SBO protocol with NG tube.  However, patient removed NG tube and refused reinsertion.  Repeat KUB with persistent SBO.  Patient underwent ex lap with lysis of adhesion and small bowel resection on 01/07/2021.  Started on TPN on 4/18.  NG tube discontinued on 4/19.  Anticipate discharge home in the next 48 to 72 hours   Subjective: Seen and examined earlier this morning.  No major events overnight of this morning.  Complains pain but states all over when asked to localize.  She does not seem to be in pain or distress.  She is awake and alert.  However, she is only oriented to self.  She states she is at [employer] school in [place].   Objective: Vitals:   01/08/21 1332 01/08/21 2134 01/09/21 0009 01/09/21 0554  BP: (!) 133/59 (!) 143/83 135/68 (!) 169/87  Pulse: 78 81 79 70  Resp: 18 18  18   Temp: 98.5 F (36.9 C) 98.5 F (36.9 C)  98 F (36.7 C)  TempSrc: Oral Oral  Oral  SpO2: 100% 99%  96%  Weight:      Height:        Intake/Output Summary (Last 24 hours) at 01/09/2021 1323 Last data filed at 01/09/2021 1212 Gross per 24 hour  Intake 2315.23 ml  Output 2500 ml  Net -184.77 ml   Filed Weights   01/02/21 1717  Weight: 54.4 kg    Examination:  GENERAL: No apparent distress.   Nontoxic. HEENT: MMM.  Vision and hearing grossly intact.  NECK: Supple.  No apparent JVD.  RESP: On RA.  No IWOB.  Fair aeration bilaterally. CVS:  RRR. Heart sounds normal.  ABD/GI/GU: BS+. Abd soft, NTND.  Honeycomb dressing with scant dried blood distally. MSK/EXT:  Moves extremities. No apparent deformity. No edema.  SKIN: no apparent skin lesion or wound NEURO: Awake and alert.  Only oriented to self.  No apparent focal neuro deficit. PSYCH: Calm. Normal affect.  Procedures:  4/17-ex lap, lysis of adhesion and small bowel resection  Microbiology summarized: COVID-19 and influenza PCR nonreactive. Urine culture NGTD. Blood culture pending  Assessment & Plan: Small bowel obstruction-persisted despite NG tube. Postop ileus-improving -Ex lap, lysis of adhesion and small bowel resection on 4/17. -NG tube removed 4/19.  Remains NPO. -Analgesics and antiemetics -TPN 4/18>>> prealbumin 7.1.  Intractable nausea, vomiting and abdominal pain: Likely due to the above. -Completed 5 days of IV Unasyn for possible enteritis noted on initial CT. 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  Recent Labs    10/11/20 1246 12/14/20 1117 01/02/21 0930 01/02/21 1520 01/03/21 0413 01/04/21 0425 01/05/21 0457 01/06/21 0317 01/07/21 0340 01/08/21 0355 01/09/21 0447  BUN 25* 31* 31*  --  32* 34* 38*  34* 23 16 16   CREATININE 1.00 1.30* 1.09* 1.12* 1.19* 1.11* 1.09* 1.29* 1.10* 0.92 1.00  -Hold nephrotoxic meds -Recheck in the morning  Normocytic anemia: Hgb dropped about a gram postop. Recent Labs    12/14/20 1039 01/02/21 0930 01/02/21 1520 01/03/21 0413 01/04/21 0425 01/05/21 0457 01/06/21 0317 01/07/21 0340 01/08/21 0355 01/09/21 0447  HGB 10.3* 10.6* 11.6* 14.4 12.6 10.9* 10.5* 9.6* 8.2* 8.5*  -Continue monitoring  Diet-controlled diabetes with hyperglycemia and hyperlipidemia: A1c  6.3%. Recent Labs  Lab 01/08/21 1143 01/08/21 1735 01/08/21 2344 01/09/21 0556 01/09/21 1137  GLUCAP 134* 127* 135* 123* 125*  -Continue SSI and CBG monitoring  Hypernatremia: Na 148>> 140.  Resolved.  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 -Reorientation and delirium precautions  Hypokalemia: Likely due to GI loss on Maxzide.  Resolved.  Hypomagnesemia: Resolved.  CHB s/p PPM-slightly tachycardic. -IV metoprolol while n.p.o. for SBO.  History of breast cancer-seems to be remission. History of vulvar cancer-seems to be remission. -Outpatient follow-up  Thrombocytopenia:  Recent Labs  Lab 01/02/21 1520 01/03/21 0413 01/04/21 0425 01/05/21 0457 01/06/21 0317 01/07/21 0340 01/08/21 0355 01/09/21 0447  PLT 168 200 197 156 163 142* 133* 124*  -Monitor   Goal of care: Patient seems to have advanced dementia.  Now with SBO and possible enteritis.  She looks frail.  Discussed CODE STATUS with patient's husband and son atbedside who prefers to continue full code. -Palliative medicine consulted.  Moderate malnutrition in the setting of poor p.o. intake and acute illness Body mass index is 23.42 kg/m. Nutrition Problem: Moderate Malnutrition Etiology: chronic illness (dementia, T2DM, CKD) Signs/Symptoms: percent weight loss,mild fat depletion,moderate fat depletion,mild muscle depletion,moderate muscle depletion Percent weight loss: 9.5 % Interventions: TPN   DVT prophylaxis:  enoxaparin (LOVENOX) injection 40 mg Start: 01/08/21 1000  Code Status: Full code Family Communication: Updated patient's son over the phone on 4/18.  None at bedside today. Level of care: Med-Surg  Status is: Inpatient  Remains inpatient appropriate because:Persistent severe electrolyte disturbances, IV treatments appropriate due to intensity of illness or inability to take PO and Inpatient level of care appropriate due to severity of  illness   Dispo: The patient is from: Home              Anticipated d/c is to: Home              Patient currently is not medically stable to d/c.   Difficult to place patient No           Consultants:  General surgery Palliative medicine   Sch Meds:  Scheduled Meds: . Chlorhexidine Gluconate Cloth  6 each Topical Daily  . enoxaparin (LOVENOX) injection  40 mg Subcutaneous Q24H  . insulin aspart  0-6 Units Subcutaneous Q6H  . metoprolol tartrate  2.5 mg Intravenous Q6H  . mupirocin ointment  1 application Nasal BID  . pantoprazole (PROTONIX) IV  40 mg Intravenous Q24H   Continuous Infusions: . dextrose 5 % and 0.45% NaCl 35 mL/hr at 01/08/21 1829  . dextrose 5 % and 0.45% NaCl    . TPN ADULT (ION) 40 mL/hr at 01/08/21 1718  . TPN ADULT (ION)     PRN Meds:.morphine injection, ondansetron **OR** ondansetron (ZOFRAN) IV, phenol  Antimicrobials: Anti-infectives (From admission, onward)   Start     Dose/Rate Route Frequency Ordered Stop   01/04/21 1200  Ampicillin-Sulbactam (UNASYN) 3 g in sodium chloride 0.9 % 100 mL IVPB  Status:  Discontinued  3 g 200 mL/hr over 30 Minutes Intravenous Every 6 hours 01/04/21 0823 01/08/21 1041   01/03/21 1600  Ampicillin-Sulbactam (UNASYN) 3 g in sodium chloride 0.9 % 100 mL IVPB  Status:  Discontinued        3 g 200 mL/hr over 30 Minutes Intravenous Every 6 hours 01/03/21 1400 01/03/21 1405   01/03/21 1600  Ampicillin-Sulbactam (UNASYN) 3 g in sodium chloride 0.9 % 100 mL IVPB  Status:  Discontinued        3 g 200 mL/hr over 30 Minutes Intravenous Every 12 hours 01/03/21 1405 01/04/21 0823   01/02/21 1230  cefTRIAXone (ROCEPHIN) 1 g in sodium chloride 0.9 % 100 mL IVPB        1 g 200 mL/hr over 30 Minutes Intravenous  Once 01/02/21 1227 01/02/21 1315       I have personally reviewed the following labs and images: CBC: Recent Labs  Lab 01/04/21 0425 01/05/21 0457 01/06/21 0317 01/07/21 0340 01/08/21 0355  01/09/21 0447  WBC 20.8* 13.6* 12.1* 7.6 11.4* 7.5  NEUTROABS 17.7* 11.1*  --   --   --  5.6  HGB 12.6 10.9* 10.5* 9.6* 8.2* 8.5*  HCT 38.5 33.8* 31.8* 30.2* 25.8* 27.4*  MCV 90.0 91.8 91.6 92.9 92.8 95.8  PLT 197 156 163 142* 133* 124*   BMP &GFR Recent Labs  Lab 01/04/21 0425 01/05/21 0457 01/06/21 0317 01/07/21 0340 01/08/21 0355 01/09/21 0447  NA 144 143 148* 147* 144 140  K 3.8 3.6 3.5 3.0* 4.3 4.0  CL 106 108 111 109 110 109  CO2 29 26 25 28 27 24   GLUCOSE 191* 135* 138* 208* 207* 142*  BUN 34* 38* 34* 23 16 16   CREATININE 1.11* 1.09* 1.29* 1.10* 0.92 1.00  CALCIUM 9.7 9.1 8.9 8.4* 7.7* 8.2*  MG 2.0 2.0 2.1 1.9 1.6* 2.2  PHOS 2.8  --  3.6 2.1* 2.2* 2.5   Estimated Creatinine Clearance: 34.4 mL/min (by C-G formula based on SCr of 1 mg/dL). Liver & Pancreas: Recent Labs  Lab 01/04/21 0425 01/05/21 0457 01/06/21 0317 01/07/21 0340 01/08/21 0355 01/09/21 0447  AST 20 17 18   --  18 22  ALT 13 13 13   --  11 12  ALKPHOS 62 50 46  --  31* 35*  BILITOT 0.9 0.8 0.8  --  0.5 0.4  PROT 6.5 5.9* 5.7*  --  4.6* 5.1*  ALBUMIN 3.4* 3.1* 3.0* 2.7* 2.4* 2.6*   No results for input(s): LIPASE, AMYLASE in the last 168 hours. No results for input(s): AMMONIA in the last 168 hours. Diabetic: No results for input(s): HGBA1C in the last 72 hours. Recent Labs  Lab 01/08/21 1143 01/08/21 1735 01/08/21 2344 01/09/21 0556 01/09/21 1137  GLUCAP 134* 127* 135* 123* 125*   Cardiac Enzymes: No results for input(s): CKTOTAL, CKMB, CKMBINDEX, TROPONINI in the last 168 hours. No results for input(s): PROBNP in the last 8760 hours. Coagulation Profile: No results for input(s): INR, PROTIME in the last 168 hours. Thyroid Function Tests: No results for input(s): TSH, T4TOTAL, FREET4, T3FREE, THYROIDAB in the last 72 hours. Lipid Profile: Recent Labs    01/09/21 0447  TRIG 79   Anemia Panel: No results for input(s): VITAMINB12, FOLATE, FERRITIN, TIBC, IRON, RETICCTPCT in the  last 72 hours. Urine analysis:    Component Value Date/Time   COLORURINE YELLOW 01/02/2021 1014   APPEARANCEUR CLEAR 01/02/2021 1014   LABSPEC 1.018 01/02/2021 1014   PHURINE 5.0 01/02/2021 1014   GLUCOSEU  50 (A) 01/02/2021 1014   HGBUR NEGATIVE 01/02/2021 [place] 01/02/2021 1014   KETONESUR 20 (A) 01/02/2021 1014   PROTEINUR NEGATIVE 01/02/2021 1014   NITRITE NEGATIVE 01/02/2021 1014   LEUKOCYTESUR LARGE (A) 01/02/2021 1014   Sepsis Labs: Invalid input(s): PROCALCITONIN, [place]  Microbiology: Recent Results (from the past 240 hour(s))  Urine Culture     Status: None   Collection Time: 01/02/21 10:14 AM   Specimen: Urine, Catheterized  Result Value Ref Range Status   Specimen Description   Final    URINE, CATHETERIZED Performed at [facility] [address], [place], [place] [address]    Special Requests   Final    NONE Performed at [facility], [place] [address], [place], [place] [address]    Culture   Final    NO GROWTH Performed at [facility], [place] [address] [place], [place] [address]    Report Status 01/03/2021 FINAL  Final  Resp Panel by RT-PCR (Flu A&B, Covid) Nasopharyngeal Swab     Status: None   Collection Time: 01/02/21 10:15 AM   Specimen: Nasopharyngeal Swab; Nasopharyngeal(NP) swabs in vial transport medium  Result Value Ref Range Status   SARS Coronavirus 2 by RT PCR NEGATIVE NEGATIVE Final    Comment: (NOTE) SARS-CoV-2 target nucleic acids are NOT DETECTED.  The SARS-CoV-2 RNA is generally detectable in upper respiratory specimens during the acute phase of infection. The lowest concentration of SARS-CoV-2 viral copies this assay can detect is 138 copies/mL. A negative result does not preclude SARS-Cov-2 infection and should not be used as the sole basis for treatment or other patient management decisions. A negative result may occur with  improper specimen collection/handling,  submission of specimen other than nasopharyngeal swab, presence of viral mutation(s) within the areas targeted by this assay, and inadequate number of viral copies(<138 copies/mL). A negative result must be combined with clinical observations, patient history, and epidemiological information. The expected result is Negative.  Fact Sheet for Patients:  EntrepreneurPulse.com.au  Fact Sheet for Healthcare Providers:  IncredibleEmployment.be  This test is no t yet approved or cleared by the [place] FDA and  has been authorized for detection and/or diagnosis of SARS-CoV-2 by FDA under an Emergency Use Authorization (EUA). This EUA will remain  in effect (meaning this test can be used) for the duration of the COVID-19 declaration under Section 564(b)(1) of the Act, 21 U.S.C.section 360bbb-3(b)(1), unless the authorization is terminated  or revoked sooner.       Influenza A by PCR NEGATIVE NEGATIVE Final   Influenza B by PCR NEGATIVE NEGATIVE Final    Comment: (NOTE) The Xpert Xpress SARS-CoV-2/FLU/RSV plus assay is intended as an aid in the diagnosis of influenza from Nasopharyngeal swab specimens and should not be used as a sole basis for treatment. Nasal washings and aspirates are unacceptable for Xpert Xpress SARS-CoV-2/FLU/RSV testing.  Fact Sheet for Patients: EntrepreneurPulse.com.au  Fact Sheet for Healthcare Providers: IncredibleEmployment.be  This test is not yet approved or cleared by the [place] FDA and has been authorized for detection and/or diagnosis of SARS-CoV-2 by FDA under an Emergency Use Authorization (EUA). This EUA will remain in effect (meaning this test can be used) for the duration of the COVID-19 declaration under Section 564(b)(1) of the Act, 21 U.S.C. section 360bbb-3(b)(1), unless the authorization is terminated or revoked.  Performed at [facility], [place] [address], [place],  [address]   Culture, blood (routine  x 2)     Status: None   Collection Time: 01/03/21  3:32 PM   Specimen: BLOOD  Result Value Ref Range Status   Specimen Description   Final    BLOOD RIGHT ANTECUBITAL Performed at [facility] [address], [place], [place] [address]    Special Requests   Final    BOTTLES DRAWN AEROBIC ONLY Blood Culture adequate volume Performed at [place] [address], [place], [place] [address]    Culture   Final    NO GROWTH 5 DAYS Performed at [facility], [place] [address] [place], [place] [address]    Report Status 01/08/2021 FINAL  Final  Culture, blood (routine x 2)     Status: None   Collection Time: 01/03/21  3:32 PM   Specimen: BLOOD LEFT HAND  Result Value Ref Range Status   Specimen Description   Final    BLOOD LEFT HAND Performed at [facility] [address], [place], [place] [address]    Special Requests   Final    BOTTLES DRAWN AEROBIC ONLY Blood Culture adequate volume Performed at [facility] [address], [place], [place] [address]    Culture   Final    NO GROWTH 5 DAYS Performed at [facility], [place] [address] [place], [place] [address]    Report Status 01/08/2021 FINAL  Final  Surgical pcr screen     Status: Abnormal   Collection Time: 01/07/21  7:52 AM   Specimen: Nasal Mucosa; Nasal Swab  Result Value Ref Range Status   MRSA, PCR POSITIVE (A) NEGATIVE Final    Comment: RESULT CALLED TO, READ BACK BY AND VERIFIED WITH: JT AT 1013 ON 01/07/2021 BY MOSLEY,J    Staphylococcus aureus POSITIVE (A) NEGATIVE Final    Comment: (NOTE) The Xpert SA Assay (FDA approved for NASAL specimens in patients 5 years of age and older), is one component of a comprehensive surveillance program. It is not intended to diagnose infection nor to guide or monitor treatment. Performed at [facility], [place] [address] [place], [place] [address]     Radiology Studies: No results found.  Myles Mallicoat T. [place]  If 7PM-7AM, please contact night-coverage [email or web address] 01/09/2021, 1:23 PM

## 2021-01-09 NOTE — Progress Notes (Signed)
Physical Therapy Treatment Patient Details Name: [name] MRN: [id number] DOB: October 07, 1945 Today's Date: 01/09/2021    History of Present Illness Patient is a 75 year old female PMH complete heart block status post pacemaker placement, diet controlled diabetes, hyperlipidemia history of breast cancer and vulvar cancer, advanced vascular dementia admitted 4/12 with N/V and abdominal pain. s/p ex lap SB resection 01/07/21    PT Comments    Progressing with mobility. A&O x 1. Easily redirected. Pt participated well.    Follow Up Recommendations  Home health PT;Supervision/Assistance - 24 hour     Equipment Recommendations  None recommended by PT    Recommendations for Other Services       Precautions / Restrictions Precautions Precautions: Fall Restrictions Weight Bearing Restrictions: No    Mobility  Bed Mobility Overal bed mobility: Needs Assistance Bed Mobility: Supine to Sit;Sit to Supine     Supine to sit: Min assist;HOB elevated Sit to supine: Min assist;HOB elevated   General bed mobility comments: Assist for trunk and bil LEs. Increased time. Cues required.    Transfers Overall transfer level: Needs assistance Equipment used: Rolling walker (2 wheeled) Transfers: Sit to/from Stand Sit to Stand: Min assist         General transfer comment: A to steady, control descent. Cues for safety.  Ambulation/Gait Ambulation/Gait assistance: Min assist Gait Distance (Feet): 350 Feet Assistive device: Rolling walker (2 wheeled) Gait Pattern/deviations: Step-through pattern;Trunk flexed     General Gait Details: Intermittent A to steady and to manage RW. Cues for safety. Pt tolerated distance well.   Stairs             Wheelchair Mobility    Modified Rankin (Stroke Patients Only)       Balance Overall balance assessment: Needs assistance         Standing balance support: Bilateral upper extremity supported Standing balance-Leahy Scale:  Poor                              Cognition Arousal/Alertness: Awake/alert Behavior During Therapy: WFL for tasks assessed/performed Overall Cognitive Status: Within Functional Limits for tasks assessed                                 General Comments: per chart patient with vascular dementia, follows directions appropriately, pt poor historian and oriented only to self.      Exercises      General Comments        Pertinent Vitals/Pain Pain Assessment: Faces Faces Pain Scale: Hurts little more Pain Location: abdomen Pain Descriptors / Indicators: Sore;Discomfort Pain Intervention(s): Monitored during session;Repositioned    Home Living                      Prior Function            PT Goals (current goals can now be found in the care plan section) Progress towards PT goals: Progressing toward goals    Frequency    Min 3X/week      PT Plan Current plan remains appropriate    Co-evaluation              AM-PAC PT "6 Clicks" Mobility   Outcome Measure  Help needed turning from your back to your side while in a flat bed without using bedrails?: A Little Help needed moving from lying  on your back to sitting on the side of a flat bed without using bedrails?: A Little Help needed moving to and from a bed to a chair (including a wheelchair)?: A Little Help needed standing up from a chair using your arms (e.g., wheelchair or bedside chair)?: A Little Help needed to walk in hospital room?: A Little Help needed climbing 3-5 steps with a railing? : A Lot 6 Click Score: 17    End of Session Equipment Utilized During Treatment: Gait belt Activity Tolerance: Patient tolerated treatment well Patient left: in bed;with call bell/phone within reach;with bed alarm set   PT Visit Diagnosis: Muscle weakness (generalized) (M62.81);Difficulty in walking, not elsewhere classified (R26.2)     Time: 4970-2637 PT Time Calculation (min)  (ACUTE ONLY): 24 min  Charges:  $Gait Training: 23-37 mins                        Doreatha Massed, PT Acute Rehabilitation  Office: [phone] Pager: [phone]

## 2021-01-10 DIAGNOSIS — R109 Unspecified abdominal pain: Secondary | ICD-10-CM

## 2021-01-10 DIAGNOSIS — N390 Urinary tract infection, site not specified: Secondary | ICD-10-CM

## 2021-01-10 LAB — COMPREHENSIVE METABOLIC PANEL
ALT: 12 U/L (ref 0–44)
AST: 21 U/L (ref 15–41)
Albumin: 2.6 g/dL — ABNORMAL LOW (ref 3.5–5.0)
Alkaline Phosphatase: 35 U/L — ABNORMAL LOW (ref 38–126)
Anion gap: 9 (ref 5–15)
BUN: 19 mg/dL (ref 8–23)
CO2: 25 mmol/L (ref 22–32)
Calcium: 8.6 mg/dL — ABNORMAL LOW (ref 8.9–10.3)
Chloride: 103 mmol/L (ref 98–111)
Creatinine, Ser: 0.75 mg/dL (ref 0.44–1.00)
GFR, Estimated: >60 mL/min (ref >60)
Glucose, Bld: 152 mg/dL — ABNORMAL HIGH (ref 70–99)
Potassium: 4.1 mmol/L (ref 3.5–5.1)
Sodium: 137 mmol/L (ref 135–145)
Total Bilirubin: 0.4 mg/dL (ref 0.3–1.2)
Total Protein: 5.1 g/dL — ABNORMAL LOW (ref 6.5–8.1)

## 2021-01-10 LAB — CBC
HCT: 26.4 % — ABNORMAL LOW (ref 36.0–46.0)
Hemoglobin: 8.5 g/dL — ABNORMAL LOW (ref 12.0–15.0)
MCH: 29.4 pg (ref 26.0–34.0)
MCHC: 32.2 g/dL (ref 30.0–36.0)
MCV: 91.3 fL (ref 80.0–100.0)
Platelets: 155 10*3/uL (ref 150–400)
RBC: 2.89 MIL/uL — ABNORMAL LOW (ref 3.87–5.11)
RDW: 13.2 % (ref 11.5–15.5)
WBC: 7.2 10*3/uL (ref 4.0–10.5)
nRBC: 0 % (ref 0.0–0.2)

## 2021-01-10 LAB — MAGNESIUM: Magnesium: 1.8 mg/dL (ref 1.7–2.4)

## 2021-01-10 LAB — GLUCOSE, CAPILLARY
Glucose-Capillary: 139 mg/dL — ABNORMAL HIGH (ref 70–99)
Glucose-Capillary: 145 mg/dL — ABNORMAL HIGH (ref 70–99)
Glucose-Capillary: 146 mg/dL — ABNORMAL HIGH (ref 70–99)
Glucose-Capillary: 159 mg/dL — ABNORMAL HIGH (ref 70–99)

## 2021-01-10 LAB — PHOSPHORUS: Phosphorus: 3.4 mg/dL (ref 2.5–4.6)

## 2021-01-10 MED ORDER — TRAVASOL 10 % IV SOLN
INTRAVENOUS | Status: AC
  Filled 2021-01-10: qty 792

## 2021-01-10 NOTE — Progress Notes (Signed)
PROGRESS NOTE    [name]  SPQ:[id number] DOB: Dec 11, 1945 DOA: 01/02/2021 PCP: Glenis Smoker, MD   Brief Narrative:75 year old F with PMH of dementia, CKD, CHB s/p PPM, DM-2, breast cancer and vulvar cancer presenting with nausea, vomiting and abdominal pain for over 24 hours after she had a seafood at a local restaurant.  She was admitted for profound hypokalemia, AKI and CT abdomen and pelvis concerning for enteritis.   Patient continued to have significant low abdominal pain.  Abdominal x-ray on 4/14 showed SBO.  General surgery consulted.  Started on SBO protocol with NG tube.  However, patient removed NG tube and refused reinsertion.  Repeat KUB with persistent SBO.  Patient underwent ex lap with lysis of adhesion and small bowel resection on 01/07/2021.  Started on TPN on 4/18.  NG tube discontinued on 4/19.  Anticipate discharge home in the next 48 to 72 hours  Assessment & Plan:   Active Problems:   Intractable vomiting with nausea, unspecified vomiting type   SBO (small bowel obstruction) (HCC)   Palliative care by specialist   Malnutrition of moderate degree   #1 small bowel obstruction-status post ex lap with lysis of adhesions and small bowel resection 01/07/2021.  Patient was started on TPN and remains NPO. Clear liquids okayed by surgery.  #2?  Enteritis by CT scan of the abdomen he was treated with Unasyn for 5 days.  #3 AKI on CKD stage III AAA resolved this was likely from prerenal dehydration from GI losses and diuretics.  #4 hypernatremia resolved  #5  Dementia without behavioral disturbance patient has been very pleasant but oriented to self only.  #6 status post permanent pacemaker complete heart block-stable  #7 goals of care patient continues to be full code palliative care has been consulted.  #8 thrombocytopenia mild monitor closely.  On Lovenox.  #9 history of breast cancer and ovarian cancer in remission.       Nutrition Problem:  Moderate Malnutrition Etiology: chronic illness (dementia, T2DM, CKD)     Signs/Symptoms: percent weight loss,mild fat depletion,moderate fat depletion,mild muscle depletion,moderate muscle depletion Percent weight loss: 9.5 %    Interventions: TPN  Estimated body mass index is 23.42 kg/m as calculated from the following:   Height as of this encounter: 5' (1.524 m).   Weight as of this encounter: 54.4 kg.  DVT prophylaxis: Lovenox  code Status: Full code  family Communication: None at bedside  disposition Plan:  Status is: Inpatient  Dispo: The patient is from: Home              Anticipated d/c is to: Home              Patient currently is not medically stable to d/c.   Difficult to place patient No   Consultants:   General surgery  Procedures: Status post exploratory lap  with lysis of adhesions 01/07/2021 Antimicrobials: None  Subjective: She is resting in bed she is awake and alert does not appear to be in any distress but talking to her she is confused on and off  Objective: Vitals:   01/09/21 0554 01/09/21 1329 01/09/21 2037 01/10/21 0459  BP: (!) 169/87 (!) 156/90 (!) 154/71 (!) 160/72  Pulse: 70 68 74 72  Resp: 18 18 18 18   Temp: 98 F (36.7 C) 98.2 F (36.8 C) 98.6 F (37 C) 97.7 F (36.5 C)  TempSrc: Oral Oral Oral Oral  SpO2: 96% 99% 100% 100%  Weight:  Height:        Intake/Output Summary (Last 24 hours) at 01/10/2021 1242 Last data filed at 01/10/2021 1000 Gross per 24 hour  Intake 1759.48 ml  Output 2850 ml  Net -1090.52 ml   Filed Weights   01/02/21 1717  Weight: 54.4 kg    Examination:  General exam: Appears calm and comfortable  Respiratory system: Clear to auscultation. Respiratory effort normal. Cardiovascular system: S1 & S2 heard, RRR. No JVD, murmurs, rubs, gallops or clicks. No pedal edema. Gastrointestinal system: Abdomen is nondistended, soft and nontender. No organomegaly or masses felt. Normal bowel sounds heard.   Incision covered with dressing with some seepage through the dressing noted. Central nervous system: Alert and oriented. No focal neurological deficits. Extremities: Symmetric 5 x 5 power. Skin: No rashes, lesions or ulcers Psychiatry: Judgement and insight appear normal. Mood & affect appropriate.     Data Reviewed: I have personally reviewed following labs and imaging studies  CBC: Recent Labs  Lab 01/04/21 0425 01/05/21 0457 01/06/21 0317 01/07/21 0340 01/08/21 0355 01/09/21 0447  WBC 20.8* 13.6* 12.1* 7.6 11.4* 7.5  NEUTROABS 17.7* 11.1*  --   --   --  5.6  HGB 12.6 10.9* 10.5* 9.6* 8.2* 8.5*  HCT 38.5 33.8* 31.8* 30.2* 25.8* 27.4*  MCV 90.0 91.8 91.6 92.9 92.8 95.8  PLT 197 156 163 142* 133* 921*   Basic Metabolic Panel: Recent Labs  Lab 01/06/21 0317 01/07/21 0340 01/08/21 0355 01/09/21 0447 01/10/21 0432  NA 148* 147* 144 140 137  K 3.5 3.0* 4.3 4.0 4.1  CL 111 109 110 109 103  CO2 25 28 27 24 25   GLUCOSE 138* 208* 207* 142* 152*  BUN 34* 23 16 16 19   CREATININE 1.29* 1.10* 0.92 1.00 0.75  CALCIUM 8.9 8.4* 7.7* 8.2* 8.6*  MG 2.1 1.9 1.6* 2.2 1.8  PHOS 3.6 2.1* 2.2* 2.5 3.4   GFR: Estimated Creatinine Clearance: 43 mL/min (by C-G formula based on SCr of 0.75 mg/dL). Liver Function Tests: Recent Labs  Lab 01/05/21 0457 01/06/21 0317 01/07/21 0340 01/08/21 0355 01/09/21 0447 01/10/21 0432  AST 17 18  --  18 22 21   ALT 13 13  --  11 12 12   ALKPHOS 50 46  --  31* 35* 35*  BILITOT 0.8 0.8  --  0.5 0.4 0.4  PROT 5.9* 5.7*  --  4.6* 5.1* 5.1*  ALBUMIN 3.1* 3.0* 2.7* 2.4* 2.6* 2.6*   No results for input(s): LIPASE, AMYLASE in the last 168 hours. No results for input(s): AMMONIA in the last 168 hours. Coagulation Profile: No results for input(s): INR, PROTIME in the last 168 hours. Cardiac Enzymes: No results for input(s): CKTOTAL, CKMB, CKMBINDEX, TROPONINI in the last 168 hours. BNP (last 3 results) No results for input(s): PROBNP in the last 8760  hours. HbA1C: No results for input(s): HGBA1C in the last 72 hours. CBG: Recent Labs  Lab 01/09/21 1733 01/09/21 2325 01/09/21 2326 01/10/21 0617 01/10/21 1154  GLUCAP 106* 64* 127* 145* 146*   Lipid Profile: Recent Labs    01/09/21 0447  TRIG 79   Thyroid Function Tests: No results for input(s): TSH, T4TOTAL, FREET4, T3FREE, THYROIDAB in the last 72 hours. Anemia Panel: No results for input(s): VITAMINB12, FOLATE, FERRITIN, TIBC, IRON, RETICCTPCT in the last 72 hours. Sepsis Labs: Recent Labs  Lab 01/04/21 1557 01/05/21 0457  LATICACIDVEN 1.5 1.0    Recent Results (from the past 240 hour(s))  Urine Culture     Status:  None   Collection Time: 01/02/21 10:14 AM   Specimen: Urine, Catheterized  Result Value Ref Range Status   Specimen Description   Final    URINE, CATHETERIZED Performed at [facility] [address] [place], [place] [address]    Special Requests   Final    NONE Performed at [facility], [place] [address] [place], [place] [address]    Culture   Final    NO GROWTH Performed at [facility], [place] [address] [place], [place] [address]    Report Status 01/03/2021 FINAL  Final  Resp Panel by RT-PCR (Flu A&B, Covid) Nasopharyngeal Swab     Status: None   Collection Time: 01/02/21 10:15 AM   Specimen: Nasopharyngeal Swab; Nasopharyngeal(NP) swabs in vial transport medium  Result Value Ref Range Status   SARS Coronavirus 2 by RT PCR NEGATIVE NEGATIVE Final    Comment: (NOTE) SARS-CoV-2 target nucleic acids are NOT DETECTED.  The SARS-CoV-2 RNA is generally detectable in upper respiratory specimens during the acute phase of infection. The lowest concentration of SARS-CoV-2 viral copies this assay can detect is 138 copies/mL. A negative result does not preclude SARS-Cov-2 infection and should not be used as the sole basis for treatment or other patient management decisions. A negative result may occur with   improper specimen collection/handling, submission of specimen other than nasopharyngeal swab, presence of viral mutation(s) within the areas targeted by this assay, and inadequate number of viral copies(<138 copies/mL). A negative result must be combined with clinical observations, patient history, and epidemiological information. The expected result is Negative.  Fact Sheet for Patients:  EntrepreneurPulse.com.au  Fact Sheet for Healthcare Providers:  IncredibleEmployment.be  This test is no t yet approved or cleared by the [place] FDA and  has been authorized for detection and/or diagnosis of SARS-CoV-2 by FDA under an Emergency Use Authorization (EUA). This EUA will remain  in effect (meaning this test can be used) for the duration of the COVID-19 declaration under Section 564(b)(1) of the Act, 21 U.S.C.section 360bbb-3(b)(1), unless the authorization is terminated  or revoked sooner.       Influenza A by PCR NEGATIVE NEGATIVE Final   Influenza B by PCR NEGATIVE NEGATIVE Final    Comment: (NOTE) The Xpert Xpress SARS-CoV-2/FLU/RSV plus assay is intended as an aid in the diagnosis of influenza from Nasopharyngeal swab specimens and should not be used as a sole basis for treatment. Nasal washings and aspirates are unacceptable for Xpert Xpress SARS-CoV-2/FLU/RSV testing.  Fact Sheet for Patients: EntrepreneurPulse.com.au  Fact Sheet for Healthcare Providers: IncredibleEmployment.be  This test is not yet approved or cleared by the [place] FDA and has been authorized for detection and/or diagnosis of SARS-CoV-2 by FDA under an Emergency Use Authorization (EUA). This EUA will remain in effect (meaning this test can be used) for the duration of the COVID-19 declaration under Section 564(b)(1) of the Act, 21 U.S.C. section 360bbb-3(b)(1), unless the authorization is terminated  or revoked.  Performed at [facility], [place] [address], [place], [place] [address]   Culture, blood (routine x 2)     Status: None   Collection Time: 01/03/21  3:32 PM   Specimen: BLOOD  Result Value Ref Range Status   Specimen Description   Final    BLOOD RIGHT ANTECUBITAL Performed at [facility] [address], [place], [place] [address]    Special Requests   Final    BOTTLES DRAWN AEROBIC ONLY  Blood Culture adequate volume Performed at [facility] [address], [place], [place] [address]    Culture   Final    NO GROWTH 5 DAYS Performed at [facility], [place] [address], [place], [place] [address]    Report Status 01/08/2021 FINAL  Final  Culture, blood (routine x 2)     Status: None   Collection Time: 01/03/21  3:32 PM   Specimen: BLOOD LEFT HAND  Result Value Ref Range Status   Specimen Description   Final    BLOOD LEFT HAND Performed at [facility] [address] [place], [place] [address]    Special Requests   Final    BOTTLES DRAWN AEROBIC ONLY Blood Culture adequate volume Performed at [facility] [address], [place], [place] [address]    Culture   Final    NO GROWTH 5 DAYS Performed at [facility], [place] [address] [place], [place] [address]    Report Status 01/08/2021 FINAL  Final  Surgical pcr screen     Status: Abnormal   Collection Time: 01/07/21  7:52 AM   Specimen: Nasal Mucosa; Nasal Swab  Result Value Ref Range Status   MRSA, PCR POSITIVE (A) NEGATIVE Final    Comment: RESULT CALLED TO, READ BACK BY AND VERIFIED WITH: JT AT 1013 ON 01/07/2021 BY MOSLEY,J    Staphylococcus aureus POSITIVE (A) NEGATIVE Final    Comment: (NOTE) The Xpert SA Assay (FDA approved for NASAL specimens in patients 33 years of age and older), is one component of a comprehensive surveillance program. It is not intended to diagnose infection nor to guide or  monitor treatment. Performed at [facility], [place] [address] [place], [place] [address]          Radiology Studies: No results found.      Scheduled Meds: . Chlorhexidine Gluconate Cloth  6 each Topical Daily  . enoxaparin (LOVENOX) injection  40 mg Subcutaneous Q24H  . insulin aspart  0-6 Units Subcutaneous Q6H  . metoprolol tartrate  2.5 mg Intravenous Q6H  . mupirocin ointment  1 application Nasal BID  . pantoprazole (PROTONIX) IV  40 mg Intravenous Q24H   Continuous Infusions: . dextrose 5 % and 0.45% NaCl 15 mL/hr at 01/09/21 1759  . TPN ADULT (ION) 60 mL/hr at 01/09/21 1749  . TPN ADULT (ION)       LOS: 6 days     Georgette Shell, MD  01/10/2021, 12:42 PM

## 2021-01-10 NOTE — Progress Notes (Signed)
PHARMACY - TOTAL PARENTERAL NUTRITION CONSULT NOTE   Indication: Prolonged ileus  Patient Measurements: Height: 5' (152.4 cm) Weight: 54.4 kg (119 lb 14.9 oz) IBW/kg (Calculated) : 45.5 TPN AdjBW (KG): 54.4 Body mass index is 23.42 kg/m.   Assessment: 50 y/oF admitted with SBO, underwent exploratory laparotomy, lysis of adhesions, and small bowel resection on 01/07/2021. Pharmacy consulted today for TPN due to expected prolonged postop ileus.   Glucose / Insulin: CBGs 64-145 (goal 100-150). 0 units SSI used since TPN initiation.  Electrolytes: All, including Corrected Calcium, WNL . Renal: SCr 0.75. BUN 19.  Hepatic: AST/ALT WNL. Alk Phos slightly low. Tbili WNL. Prealbumin: 16.3 (4/13), 7.9 (4/16), 7.1 (4/19) Triglycercides: 79 (4/19) Intake / Output; MIVF: -3766mL UOP; I/O - 1988mL; MIVF D5 1/2 NS at 53mL/hour GI Imaging: 4/17 AXR: High-grade small bowel obstruction with no progression of enteric contrast. GI Surgeries / Procedures: 4/17 exploratory laparotomy, lysis of adhesions, small bowel resection   Central access: has implanted port TPN start date: 01/08/21  Nutritional Goals  RD recommendations 4/18: Kcal:  1750-1950 Protein:  65-80 grams Fluid:  >1.75 L  Goal TPN rate is 75 mL/hr (provides 79g of protein and 1836 kcals per day)  Current Nutrition:  NPO, TPN   Plan:  At 1800: Increase TPN to 44mL/hr.  Electrolytes in TPN: Na 69mEq/L, K 80mEq/L, Ca 62mEq/L, Mg 42mEq/L, and Phos 78mmol/L. Cl:Ac 1:1 Add standard MVI and trace elements to TPN. Continue very sensitive SSI q6h. Adjust as needed.   Continue MIVF D5 1/2 NS at 25mL/hr.  Monitor TPN labs on Mon/Thurs. F/u advancement of diet and for ability to wean TPN.   Lindell Spar, PharmD, BCPS Clinical Pharmacist  01/10/2021,11:30 AM

## 2021-01-10 NOTE — Progress Notes (Signed)
3 Days Post-Op  Subjective: Patient is alert, pleasant, confused. She is oriented to self only. She reports mild abdominal soreness and does not recall that she is in the hospital or that she had surgery. She is asking for help up to the bathroom. Reports flatus. Unsure of her last BM.    Objective: Vital signs in last 24 hours: Temp:  [97.7 F (36.5 C)-98.6 F (37 C)] 97.7 F (36.5 C) (04/20 0459) Pulse Rate:  [68-74] 72 (04/20 0459) Resp:  [18] 18 (04/20 0459) BP: (154-160)/(71-90) 160/72 (04/20 0459) SpO2:  [99 %-100 %] 100 % (04/20 0459) Last BM Date: 01/08/21 (patient said 4/18 but confused)  Intake/Output from previous day: 04/19 0701 - 04/20 0700 In: 1799.5 [I.V.:1799.5] Out: 3700 [Urine:3700] Intake/Output this shift: No intake/output data recorded.  PE: General: resting comfortably, NAD HEENT: NG in place draining clear gastric contents, nonbilious. Resp: normal work of breathing CV: RRR Abdomen: soft, nondistended, appropriately tender to palpation. Incision clean and dry, honeycomb dressing in place with some SS strikethrough on dressing, +BS Extremities: warm and well-perfused Neuro: oriented to person only, reports year as 2000, place as church, does state that we are in [place].   Lab Results:  Recent Labs    01/08/21 0355 01/09/21 0447  WBC 11.4* 7.5  HGB 8.2* 8.5*  HCT 25.8* 27.4*  PLT 133* 124*   BMET Recent Labs    01/09/21 0447 01/10/21 0432  NA 140 137  K 4.0 4.1  CL 109 103  CO2 24 25  GLUCOSE 142* 152*  BUN 16 19  CREATININE 1.00 0.75  CALCIUM 8.2* 8.6*   PT/INR No results for input(s): LABPROT, INR in the last 72 hours. CMP     Component Value Date/Time   NA 137 01/10/2021 0432   NA 139 09/01/2018 1550   NA 138 11/22/2013 1447   K 4.1 01/10/2021 0432   K 4.2 11/22/2013 1447   CL 103 01/10/2021 0432   CL 104 11/03/2012 1419   CO2 25 01/10/2021 0432   CO2 28 11/22/2013 1447   GLUCOSE 152 (H) 01/10/2021 0432   GLUCOSE 319  (H) 11/22/2013 1447   GLUCOSE 119 (H) 11/03/2012 1419   BUN 19 01/10/2021 0432   BUN 13 09/01/2018 1550   BUN 11.5 11/22/2013 1447   CREATININE 0.75 01/10/2021 0432   CREATININE 1.30 (H) 12/14/2020 1117   CREATININE 0.8 11/22/2013 1447   CALCIUM 8.6 (L) 01/10/2021 0432   CALCIUM 10.0 11/22/2013 1447   PROT 5.1 (L) 01/10/2021 0432   PROT 6.9 11/22/2013 1447   ALBUMIN 2.6 (L) 01/10/2021 0432   ALBUMIN 3.5 11/22/2013 1447   AST 21 01/10/2021 0432   AST 11 11/22/2013 1447   ALT 12 01/10/2021 0432   ALT 13 11/22/2013 1447   ALKPHOS 35 (L) 01/10/2021 0432   ALKPHOS 94 11/22/2013 1447   BILITOT 0.4 01/10/2021 0432   BILITOT 0.20 11/22/2013 1447   GFRNONAA >60 01/10/2021 0432   GFRNONAA 43 (L) 12/14/2020 1117   GFRAA >60 06/21/2020 1423   Lipase     Component Value Date/Time   LIPASE 15 07/17/2010 1628      Assessment/Plan 75 yo female with SBO, POD3 s/p ex lap, lysis of adhesions and small bowel resection 01/07/21 Dr. Marlou Starks  - no nausea/vomiting with NG removed, abdomen soft with good BS, amount of bowel function is unclear but given the rest of her exam I think starting clear liquids today is ok. - Continue TPN - Surgery  will continue to follow closely    LOS: 6 days    Obie Dredge, [facility] General & Trauma Surgery 01/10/21 8:25 AM

## 2021-01-10 NOTE — TOC Initial Note (Signed)
Transition of Care [facility]) - Initial/Assessment Note    Patient Details  Name: [name] MRN: [id number] Date of Birth: Jan 28, 1946  Transition of Care [facility]) CM/SW Contact:    Lennart Pall, LCSW Phone Number: 01/10/2021, 2:36 PM  Clinical Narrative:                 Met today with pt, spouse and daughter to introduce self/ role of TOC.  Pt very pleasant and family very supportive.  She reports that her spouse is primary support and he is available to provide 24/7 care in the home.  Adult children living in area and also supportive.  Explained that PT is currently recommending HHPT for home and pt is agreeable if still recommended when discharging. She feels her strength is slowly improving.  Family deny any concern with meeting her assist needs and no concerns about discharge at this point.  Will continue to follow.  Expected Discharge Plan: [place] (vs. Home/ Self Care) Barriers to Discharge: Continued Medical Work up   Patient Goals and CMS Choice Patient states their goals for this hospitalization and ongoing recovery are:: return home      Expected Discharge Plan and Services Expected Discharge Plan: [place] (vs. Home/ Self Care)       Living arrangements for the past 2 months: Single Family Home                                      Prior Living Arrangements/Services Living arrangements for the past 2 months: Single Family Home Lives with:: Spouse Patient language and need for interpreter reviewed:: Yes Do you feel safe going back to the place where you live?: Yes      Need for Family Participation in Patient Care: Yes (Comment) Care giver support system in place?: Yes (comment)   Criminal Activity/Legal Involvement Pertinent to Current Situation/Hospitalization: No - Comment as needed  Activities of Daily Living Home Assistive Devices/Equipment: Eyeglasses ADL Screening (condition at time of admission) Patient's  cognitive ability adequate to safely complete daily activities?: Yes Is the patient deaf or have difficulty hearing?: No Does the patient have difficulty seeing, even when wearing glasses/contacts?: No Does the patient have difficulty concentrating, remembering, or making decisions?: Yes Patient able to express need for assistance with ADLs?: Yes Does the patient have difficulty dressing or bathing?: No Independently performs ADLs?: Yes (appropriate for developmental age) Does the patient have difficulty walking or climbing stairs?: No Weakness of Legs: None Weakness of Arms/Hands: None  Permission Sought/Granted Permission sought to share information with : Family Supports Permission granted to share information with : Yes, Verbal Permission Granted  Share Information with NAME: [name]     Permission granted to share info w Relationship: spouse  Permission granted to share info w Contact Information: [phone]  Emotional Assessment Appearance:: Appears stated age Attitude/Demeanor/Rapport: Gracious Affect (typically observed): Accepting,Pleasant Orientation: : Oriented to Self,Oriented to Place,Oriented to  Time,Oriented to Situation Alcohol / Substance Use: Not Applicable Psych Involvement: No (comment)  Admission diagnosis:  Hypokalemia [E87.6] Abdominal pain, unspecified abdominal location [R10.9] Urinary tract infection without hematuria, site unspecified [N39.0] Nausea and vomiting, intractability of vomiting not specified, unspecified vomiting type [R11.2] Intractable vomiting with nausea, unspecified vomiting type [R11.2] SBO (small bowel obstruction) ([place]) [K56.609] Patient Active Problem List   Diagnosis Date Noted  . Abdominal pain   . Urinary  tract infection without hematuria   . Malnutrition of moderate degree 01/08/2021  . Palliative care by specialist   . SBO (small bowel obstruction) ([place]) 01/04/2021  . Nausea and vomiting 01/02/2021  . Age-related  cognitive decline 12/14/2020  . 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 Family history of prostate cancer   . Pancytopenia, acquired ([place]) 07/27/2020  . Thrombocytopenia ([place]) 07/13/2020  . Other constipation 07/06/2020  . Anemia due to antineoplastic chemotherapy 06/22/2020  . Surgical wound, non healing 06/15/2020  . Cancer associated pain 06/15/2020  . Goals of care, counseling/discussion 06/07/2020  . Weight loss, abnormal 06/06/2020  . Complete heart block ([place]) 05/15/2020  . Pacemaker 05/15/2020  . Memory loss 09/03/2018  . Bradycardia 07/15/2018  . Vulval lesion 02/01/2016  . Vulvar cancer ([place]) 01/10/2016  . 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 DIABETES, TYPE 2 11/28/2008  . COUGH 11/28/2008  . NEOPLASM, MALIGNANT, BREAST, HX OF 11/28/2008   PCP:  Glenis Smoker, MD Pharmacy:   [facility] [address] [place] - [address] [place] [address] Phone: [phone] Fax: [phone]     Social Determinants of Health (SDOH) Interventions    Readmission Risk Interventions Readmission Risk Prevention Plan 01/10/2021  Transportation Screening Complete  PCP or Specialist Appt within 5-7 Days Complete  Home Care Screening Complete  Some recent data might be hidden

## 2021-01-11 LAB — COMPREHENSIVE METABOLIC PANEL
ALT: 14 U/L (ref 0–44)
AST: 20 U/L (ref 15–41)
Albumin: 2.4 g/dL — ABNORMAL LOW (ref 3.5–5.0)
Alkaline Phosphatase: 37 U/L — ABNORMAL LOW (ref 38–126)
Anion gap: 7 (ref 5–15)
BUN: 23 mg/dL (ref 8–23)
CO2: 25 mmol/L (ref 22–32)
Calcium: 8.7 mg/dL — ABNORMAL LOW (ref 8.9–10.3)
Chloride: 105 mmol/L (ref 98–111)
Creatinine, Ser: 0.85 mg/dL (ref 0.44–1.00)
GFR, Estimated: >60 mL/min (ref >60)
Glucose, Bld: 162 mg/dL — ABNORMAL HIGH (ref 70–99)
Potassium: 4.3 mmol/L (ref 3.5–5.1)
Sodium: 137 mmol/L (ref 135–145)
Total Bilirubin: 0.6 mg/dL (ref 0.3–1.2)
Total Protein: 5 g/dL — ABNORMAL LOW (ref 6.5–8.1)

## 2021-01-11 LAB — PHOSPHORUS: Phosphorus: 3.9 mg/dL (ref 2.5–4.6)

## 2021-01-11 LAB — GLUCOSE, CAPILLARY
Glucose-Capillary: 160 mg/dL — ABNORMAL HIGH (ref 70–99)
Glucose-Capillary: 172 mg/dL — ABNORMAL HIGH (ref 70–99)
Glucose-Capillary: 132 mg/dL — ABNORMAL HIGH (ref 70–99)
Glucose-Capillary: 139 mg/dL — ABNORMAL HIGH (ref 70–99)

## 2021-01-11 LAB — MAGNESIUM: Magnesium: 1.9 mg/dL (ref 1.7–2.4)

## 2021-01-11 MED ORDER — INSULIN ASPART 100 UNIT/ML ~~LOC~~ SOLN: 0.0000 [IU] | Freq: Every day | SUBCUTANEOUS | Status: DC

## 2021-01-11 MED ORDER — TRAMADOL HCL 50 MG PO TABS
50.0000 mg | ORAL_TABLET | Freq: Four times a day (QID) | ORAL | Status: DC | PRN
  Administered 2021-01-11: 50 mg via ORAL
  Filled 2021-01-11: qty 1

## 2021-01-11 MED ORDER — TRAVASOL 10 % IV SOLN
INTRAVENOUS | Status: AC
  Filled 2021-01-11: qty 528

## 2021-01-11 MED ORDER — INSULIN ASPART 100 UNIT/ML ~~LOC~~ SOLN
0.0000 [IU] | Freq: Three times a day (TID) | SUBCUTANEOUS | Status: DC
  Administered 2021-01-11: 0 [IU] via SUBCUTANEOUS
  Administered 2021-01-11: 1 [IU] via SUBCUTANEOUS

## 2021-01-11 NOTE — Progress Notes (Signed)
    4 Days Post-Op  Subjective: Alert, pleasantly confused. Does not remember why she is in the hospital. States she is hungry. Reports incontinence of stool overnight. Denies nausea/emesis.  Objective: Vital signs in last 24 hours: Temp:  [98.3 F (36.8 C)-98.7 F (37.1 C)] 98.7 F (37.1 C) (04/21 0628) Pulse Rate:  [71-75] 72 (04/21 0628) Resp:  [16-18] 18 (04/21 0628) BP: (141-157)/(60-74) 141/60 (04/21 0628) SpO2:  [99 %-100 %] 100 % (04/21 0628) Last BM Date: 01/10/21  Intake/Output from previous day: 04/20 0701 - 04/21 0700 In: 1234.9 [P.O.:480; I.V.:754.9] Out: 2002 [Urine:2000; Stool:2] Intake/Output this shift: No intake/output data recorded.  PE: General: resting comfortably, NAD HEENT: NG in place draining clear gastric contents, nonbilious. Resp: normal work of breathing CV: RRR Abdomen: soft, nondistended, non-tender. Incision clean and dry, honeycomb dressing removed, staples c/d/i without cellulitis.  Extremities: warm and well-perfused Neuro: oriented to person only, reports year as 2000, place as church, does state that we are in [place].   Lab Results:  Recent Labs    01/09/21 0447 01/10/21 2231  WBC 7.5 7.2  HGB 8.5* 8.5*  HCT 27.4* 26.4*  PLT 124* 155   BMET Recent Labs    01/10/21 0432 01/11/21 0446  NA 137 137  K 4.1 4.3  CL 103 105  CO2 25 25  GLUCOSE 152* 162*  BUN 19 23  CREATININE 0.75 0.85  CALCIUM 8.6* 8.7*   PT/INR No results for input(s): LABPROT, INR in the last 72 hours. CMP     Component Value Date/Time   NA 137 01/11/2021 0446   NA 139 09/01/2018 1550   NA 138 11/22/2013 1447   K 4.3 01/11/2021 0446   K 4.2 11/22/2013 1447   CL 105 01/11/2021 0446   CL 104 11/03/2012 1419   CO2 25 01/11/2021 0446   CO2 28 11/22/2013 1447   GLUCOSE 162 (H) 01/11/2021 0446   GLUCOSE 319 (H) 11/22/2013 1447   GLUCOSE 119 (H) 11/03/2012 1419   BUN 23 01/11/2021 0446   BUN 13 09/01/2018 1550   BUN 11.5 11/22/2013 1447   CREATININE  0.85 01/11/2021 0446   CREATININE 1.30 (H) 12/14/2020 1117   CREATININE 0.8 11/22/2013 1447   CALCIUM 8.7 (L) 01/11/2021 0446   CALCIUM 10.0 11/22/2013 1447   PROT 5.0 (L) 01/11/2021 0446   PROT 6.9 11/22/2013 1447   ALBUMIN 2.4 (L) 01/11/2021 0446   ALBUMIN 3.5 11/22/2013 1447   AST 20 01/11/2021 0446   AST 11 11/22/2013 1447   ALT 14 01/11/2021 0446   ALT 13 11/22/2013 1447   ALKPHOS 37 (L) 01/11/2021 0446   ALKPHOS 94 11/22/2013 1447   BILITOT 0.6 01/11/2021 0446   BILITOT 0.20 11/22/2013 1447   GFRNONAA >60 01/11/2021 0446   GFRNONAA 43 (L) 12/14/2020 1117   GFRAA >60 06/21/2020 1423   Lipase     Component Value Date/Time   LIPASE 15 07/17/2010 1628      Assessment/Plan 75 yo female with SBO, POD4 s/p ex lap, lysis of adhesions and small bowel resection 01/07/21 Dr. Marlou Starks  - pleasantly demented and alert. Bowel function returning post-op, abdomen is soft and benign, incision looks good. Advance to SOFT diet. - can likely start to wean TPN - Surgery will continue to follow   LOS: 7 days    Obie Dredge, [facility] 01/11/21 8:56 AM

## 2021-01-11 NOTE — Progress Notes (Signed)
Physical Therapy Treatment Patient Details Name: [name] MRN: [id number] DOB: 1946-06-05 Today's Date: 01/11/2021    History of Present Illness Patient is a 75 year old female PMH complete heart block status post pacemaker placement, diet controlled diabetes, hyperlipidemia history of breast cancer and vulvar cancer, advanced vascular dementia admitted 4/12 with N/V and abdominal pain. s/p ex lap SB resection 01/07/21    PT Comments    General Comments: pt on phone on arrival AxO x 3 very pleasant overall feeling "very tired" and "no energy" Assisted pt OOB required increased time.  General bed mobility comments: Assist for trunk and bil LEs.Increased assist back to bed.   Increased time. Cues required.General transfer comment: 50% VC's on proper hand placement to avoid pulling self up on walker.  General Gait Details: light need for walker for mild instability post op ABD surgery.  Tolerated a functional distance. Assisted back to bed per pt request to rest.   Pt plans to D/C back home with spouse.    Follow Up Recommendations  Home health PT;Supervision/Assistance - 24 hour     Equipment Recommendations  None recommended by PT    Recommendations for Other Services       Precautions / Restrictions Precautions Precautions: Fall Precaution Comments: recent ABD surgery    Mobility  Bed Mobility Overal bed mobility: Needs Assistance Bed Mobility: Supine to Sit;Sit to Supine     Supine to sit: Min assist;HOB elevated Sit to supine: Mod assist   General bed mobility comments: Assist for trunk and bil LEs.Increased assist back to bed.   Increased time. Cues required.    Transfers Overall transfer level: Needs assistance Equipment used: Rolling walker (2 wheeled) Transfers: Sit to/from Stand Sit to Stand: Min assist;Min guard         General transfer comment: 50% VC's on proper hand placement to avoid pulling self up on walker  Ambulation/Gait Ambulation/Gait  assistance: Min assist Gait Distance (Feet): 175 Feet Assistive device: Rolling walker (2 wheeled) Gait Pattern/deviations: Step-through pattern;Trunk flexed Gait velocity: decreased   General Gait Details: light need for walker for mild instability post op ABD surgery.  Tolerated a functional distance.   Stairs             Wheelchair Mobility    Modified Rankin (Stroke Patients Only)       Balance                                            Cognition Arousal/Alertness: Awake/alert Behavior During Therapy: WFL for tasks assessed/performed Overall Cognitive Status: Within Functional Limits for tasks assessed                                 General Comments: pt on phone on arrival AxO x 3 very pleasant overall feeling "very tired" and "no energy"      Exercises      General Comments        Pertinent Vitals/Pain Pain Assessment: Faces Faces Pain Scale: Hurts little more Pain Location: abdomen with activity Pain Descriptors / Indicators: Sore;Discomfort;Grimacing Pain Intervention(s): Monitored during session;Repositioned    Home Living                      Prior Function  PT Goals (current goals can now be found in the care plan section) Progress towards PT goals: Progressing toward goals    Frequency    Min 3X/week      PT Plan Current plan remains appropriate    Co-evaluation              AM-PAC PT "6 Clicks" Mobility   Outcome Measure  Help needed turning from your back to your side while in a flat bed without using bedrails?: A Little Help needed moving from lying on your back to sitting on the side of a flat bed without using bedrails?: A Little Help needed moving to and from a bed to a chair (including a wheelchair)?: A Little Help needed standing up from a chair using your arms (e.g., wheelchair or bedside chair)?: A Little Help needed to walk in hospital room?: A Little Help  needed climbing 3-5 steps with a railing? : A Little 6 Click Score: 18    End of Session Equipment Utilized During Treatment: Gait belt Activity Tolerance: Patient tolerated treatment well Patient left: in bed;with call bell/phone within reach;with bed alarm set Nurse Communication: Mobility status PT Visit Diagnosis: Muscle weakness (generalized) (M62.81);Difficulty in walking, not elsewhere classified (R26.2)     Time: 0786-7544 PT Time Calculation (min) (ACUTE ONLY): 28 min  Charges:  $Gait Training: 8-22 mins $Therapeutic Activity: 8-22 mins                     Rica Koyanagi  PTA Acute  Rehabilitation Services Pager      [phone] Office      [phone]

## 2021-01-11 NOTE — Progress Notes (Signed)
PHARMACY - TOTAL PARENTERAL NUTRITION CONSULT NOTE   Indication: Prolonged ileus  Patient Measurements: Height: 5' (152.4 cm) Weight: 54.4 kg (119 lb 14.9 oz) IBW/kg (Calculated) : 45.5 TPN AdjBW (KG): 54.4 Body mass index is 23.42 kg/m.   Assessment: 46 y/oF admitted with SBO, underwent exploratory laparotomy, lysis of adhesions, and small bowel resection on 01/07/2021. Pharmacy consulted today for TPN due to expected prolonged postop ileus.   Glucose / Insulin: CBGs fairly stable 139-160 (goal <150). 2 units SSI used last 24hr Electrolytes: All WNL . Renal: SCr 0.85 stable. BUN 23 stable Hepatic: AST/ALT WNL. Alk Phos slightly low. Tbili WNL. Prealbumin: 16.3 (4/13), 7.9 (4/16), 7.1 (4/19) Triglycercides: 79 (4/19) Intake / Output; MIVF: 2070mL UOP down; I/O - 744mL; MIVF D5 1/2 NS at 43mL/hour GI Imaging: 4/17 AXR: High-grade small bowel obstruction with no progression of enteric contrast. GI Surgeries / Procedures: 4/17 exploratory laparotomy, lysis of adhesions, small bowel resection   Central access: has implanted port TPN start date: 01/08/21  Nutritional Goals  RD recommendations 4/18: Kcal:  1750-1950 Protein:  65-80 grams Fluid:  >1.75 L  Goal TPN rate is 75 mL/hr (provides 79g of protein and 1836 kcals per day)  Current Nutrition:  NPO -  Patient hungry per notes, diet now changed to "soft"  Plan:  At 1800:  Per CCS note, can start to wean TPN so will decrease rate from 52mL/hr to 50 ml/hr with new bag this PM   Electrolytes in TPN: Na 64mEq/L, K 12mEq/L, Ca 13mEq/L, Mg 6mEq/L, and Phos 48mmol/L. Cl:Ac 1:1  Add standard MVI and trace elements to TPN.  Continue very sensitive SSI and will change to ACHS   Continue MIVF D5 1/2 NS at 2mL/hr.   Monitor TPN labs on Mon/Thurs.  F/u advancement of diet and for ability to wean TPN.   Adrian Saran, PharmD, BCPS 01/11/2021 10:30 AM

## 2021-01-11 NOTE — Progress Notes (Signed)
PROGRESS NOTE    BRIARROSE SHOR  NFA:[id number] DOB: August 27, 1946 DOA: 01/02/2021 PCP: Glenis Smoker, MD   Brief Narrative:75 year old F with PMH of dementia, CKD, CHB s/p PPM, DM-2, breast cancer and vulvar cancer presenting with nausea, vomiting and abdominal pain for over 24 hours after she had a seafood at a local restaurant.  She was admitted for profound hypokalemia, AKI and CT abdomen and pelvis concerning for enteritis.   Patient continued to have significant low abdominal pain.  Abdominal x-ray on 4/14 showed SBO.  General surgery consulted.  Started on SBO protocol with NG tube.  However, patient removed NG tube and refused reinsertion.  Repeat KUB with persistent SBO.  Patient underwent ex lap with lysis of adhesion and small bowel resection on 01/07/2021.  Started on TPN on 4/18.  NG tube discontinued on 4/19.  Anticipate discharge home in the next 48 to 72 hours  Assessment & Plan:   Active Problems:   Nausea and vomiting   SBO (small bowel obstruction) (HCC)   Palliative care by specialist   Malnutrition of moderate degree   Abdominal pain   Urinary tract infection without hematuria   #1 small bowel obstruction-status post ex lap with lysis of adhesions and small bowel resection 01/07/2021.  Patient was started on TPN due to prolonged postop ileus, starting soft diet 01/11/2021.  #2?  Enteritis by CT scan of the abdomen he was treated with Unasyn for 5 days.  #3 AKI on CKD stage III AAA resolved this was likely from prerenal dehydration from GI losses and diuretics.  #4 hypernatremia resolved  #5  Dementia without behavioral disturbance patient has been very pleasant but oriented to self only.  #6 status post permanent pacemaker complete heart block-stable  #7 goals of care patient continues to be full code palliative care has been consulted.  #8 thrombocytopenia mild monitor closely.  On Lovenox.  #9 history of breast cancer and ovarian cancer in  remission.       Nutrition Problem: Moderate Malnutrition Etiology: chronic illness (dementia, T2DM, CKD)     Signs/Symptoms: percent weight loss,mild fat depletion,moderate fat depletion,mild muscle depletion,moderate muscle depletion Percent weight loss: 9.5 %    Interventions: TPN  Estimated body mass index is 23.42 kg/m as calculated from the following:   Height as of this encounter: 5' (1.524 m).   Weight as of this encounter: 54.4 kg.  DVT prophylaxis: Lovenox  code Status: Full code  family Communication: None at bedside  disposition Plan:  Status is: Inpatient  Dispo: The patient is from: Home              Anticipated d/c is to: Home              Patient currently is not medically stable to d/c.   Difficult to place patient No   Consultants:   General surgery  Procedures: Status post exploratory lap  with lysis of adhesions 01/07/2021 Antimicrobials: None  Subjective: Patient is resting in bed reports having a bowel movement last night no nausea vomiting.  Objective: Vitals:   01/10/21 0459 01/10/21 1334 01/10/21 2034 01/11/21 0628  BP: (!) 160/72 (!) 152/66 (!) 157/74 (!) 141/60  Pulse: 72 71 75 72  Resp: 18 16 18 18   Temp: 97.7 F (36.5 C) 98.3 F (36.8 C) 98.7 F (37.1 C) 98.7 F (37.1 C)  TempSrc: Oral Oral Oral Oral  SpO2: 100% 100% 99% 100%  Weight:      Height:  Intake/Output Summary (Last 24 hours) at 01/11/2021 1224 Last data filed at 01/11/2021 1000 Gross per 24 hour  Intake 974.89 ml  Output 2002 ml  Net -1027.11 ml   Filed Weights   01/02/21 1717  Weight: 54.4 kg    Examination:  General exam: Appears calm and comfortable  Respiratory system: Clear to auscultation. Respiratory effort normal. Cardiovascular system: S1 & S2 heard, RRR. No JVD, murmurs, rubs, gallops or clicks. No pedal edema. Gastrointestinal system: Abdomen is nondistended, soft and nontender. No organomegaly or masses felt. Normal bowel sounds heard.   Incision covered with dressing with some seepage through the dressing noted. Central nervous system: Alert and oriented. No focal neurological deficits. Extremities: Symmetric 5 x 5 power. Skin: No rashes, lesions or ulcers Psychiatry: Judgement and insight appear normal. Mood & affect appropriate.     Data Reviewed: I have personally reviewed following labs and imaging studies  CBC: Recent Labs  Lab 01/05/21 0457 01/06/21 0317 01/07/21 0340 01/08/21 0355 01/09/21 0447 01/10/21 2231  WBC 13.6* 12.1* 7.6 11.4* 7.5 7.2  NEUTROABS 11.1*  --   --   --  5.6  --   HGB 10.9* 10.5* 9.6* 8.2* 8.5* 8.5*  HCT 33.8* 31.8* 30.2* 25.8* 27.4* 26.4*  MCV 91.8 91.6 92.9 92.8 95.8 91.3  PLT 156 163 142* 133* 124* 370   Basic Metabolic Panel: Recent Labs  Lab 01/07/21 0340 01/08/21 0355 01/09/21 0447 01/10/21 0432 01/11/21 0446  NA 147* 144 140 137 137  K 3.0* 4.3 4.0 4.1 4.3  CL 109 110 109 103 105  CO2 28 27 24 25 25   GLUCOSE 208* 207* 142* 152* 162*  BUN 23 16 16 19 23   CREATININE 1.10* 0.92 1.00 0.75 0.85  CALCIUM 8.4* 7.7* 8.2* 8.6* 8.7*  MG 1.9 1.6* 2.2 1.8 1.9  PHOS 2.1* 2.2* 2.5 3.4 3.9   GFR: Estimated Creatinine Clearance: 40.4 mL/min (by C-G formula based on SCr of 0.85 mg/dL). Liver Function Tests: Recent Labs  Lab 01/06/21 0317 01/07/21 0340 01/08/21 0355 01/09/21 0447 01/10/21 0432 01/11/21 0446  AST 18  --  18 22 21 20   ALT 13  --  11 12 12 14   ALKPHOS 46  --  31* 35* 35* 37*  BILITOT 0.8  --  0.5 0.4 0.4 0.6  PROT 5.7*  --  4.6* 5.1* 5.1* 5.0*  ALBUMIN 3.0* 2.7* 2.4* 2.6* 2.6* 2.4*   No results for input(s): LIPASE, AMYLASE in the last 168 hours. No results for input(s): AMMONIA in the last 168 hours. Coagulation Profile: No results for input(s): INR, PROTIME in the last 168 hours. Cardiac Enzymes: No results for input(s): CKTOTAL, CKMB, CKMBINDEX, TROPONINI in the last 168 hours. BNP (last 3 results) No results for input(s): PROBNP in the last 8760  hours. HbA1C: No results for input(s): HGBA1C in the last 72 hours. CBG: Recent Labs  Lab 01/10/21 1154 01/10/21 1742 01/10/21 2352 01/11/21 0617 01/11/21 1118  GLUCAP 146* 139* 159* 160* 172*   Lipid Profile: Recent Labs    01/09/21 0447  TRIG 79   Thyroid Function Tests: No results for input(s): TSH, T4TOTAL, FREET4, T3FREE, THYROIDAB in the last 72 hours. Anemia Panel: No results for input(s): VITAMINB12, FOLATE, FERRITIN, TIBC, IRON, RETICCTPCT in the last 72 hours. Sepsis Labs: Recent Labs  Lab 01/04/21 1557 01/05/21 0457  LATICACIDVEN 1.5 1.0    Recent Results (from the past 240 hour(s))  Urine Culture     Status: None   Collection Time: 01/02/21  10:14 AM   Specimen: Urine, Catheterized  Result Value Ref Range Status   Specimen Description   Final    URINE, CATHETERIZED Performed at [facility], [place] [address] [place], [place] [address]    Special Requests   Final    NONE Performed at [facility], [place] [address] [place], [place] [address]    Culture   Final    NO GROWTH Performed at [facility], [place] [address] [place], [place] [address]    Report Status 01/03/2021 FINAL  Final  Resp Panel by RT-PCR (Flu A&B, Covid) Nasopharyngeal Swab     Status: None   Collection Time: 01/02/21 10:15 AM   Specimen: Nasopharyngeal Swab; Nasopharyngeal(NP) swabs in vial transport medium  Result Value Ref Range Status   SARS Coronavirus 2 by RT PCR NEGATIVE NEGATIVE Final    Comment: (NOTE) SARS-CoV-2 target nucleic acids are NOT DETECTED.  The SARS-CoV-2 RNA is generally detectable in upper respiratory specimens during the acute phase of infection. The lowest concentration of SARS-CoV-2 viral copies this assay can detect is 138 copies/mL. A negative result does not preclude SARS-Cov-2 infection and should not be used as the sole basis for treatment or other patient management decisions. A negative result may occur  with  improper specimen collection/handling, submission of specimen other than nasopharyngeal swab, presence of viral mutation(s) within the areas targeted by this assay, and inadequate number of viral copies(<138 copies/mL). A negative result must be combined with clinical observations, patient history, and epidemiological information. The expected result is Negative.  Fact Sheet for Patients:  EntrepreneurPulse.com.au  Fact Sheet for Healthcare Providers:  IncredibleEmployment.be  This test is no t yet approved or cleared by the [place] FDA and  has been authorized for detection and/or diagnosis of SARS-CoV-2 by FDA under an Emergency Use Authorization (EUA). This EUA will remain  in effect (meaning this test can be used) for the duration of the COVID-19 declaration under Section 564(b)(1) of the Act, 21 U.S.C.section 360bbb-3(b)(1), unless the authorization is terminated  or revoked sooner.       Influenza A by PCR NEGATIVE NEGATIVE Final   Influenza B by PCR NEGATIVE NEGATIVE Final    Comment: (NOTE) The Xpert Xpress SARS-CoV-2/FLU/RSV plus assay is intended as an aid in the diagnosis of influenza from Nasopharyngeal swab specimens and should not be used as a sole basis for treatment. Nasal washings and aspirates are unacceptable for Xpert Xpress SARS-CoV-2/FLU/RSV testing.  Fact Sheet for Patients: EntrepreneurPulse.com.au  Fact Sheet for Healthcare Providers: IncredibleEmployment.be  This test is not yet approved or cleared by the [place] FDA and has been authorized for detection and/or diagnosis of SARS-CoV-2 by FDA under an Emergency Use Authorization (EUA). This EUA will remain in effect (meaning this test can be used) for the duration of the COVID-19 declaration under Section 564(b)(1) of the Act, 21 U.S.C. section 360bbb-3(b)(1), unless the authorization is terminated  or revoked.  Performed at [facility], [place] [address], [place], [place] [address]   Culture, blood (routine x 2)     Status: None   Collection Time: 01/03/21  3:32 PM   Specimen: BLOOD  Result Value Ref Range Status   Specimen Description   Final    BLOOD RIGHT ANTECUBITAL Performed at [place] [address] [place], [place] [address]    Special Requests   Final    BOTTLES DRAWN AEROBIC ONLY Blood Culture adequate volume Performed at  [facility], [place] [address], [place], [place] [address]    Culture   Final    NO GROWTH 5 DAYS Performed at [facility], [place] [address], [place], [place] [address]    Report Status 01/08/2021 FINAL  Final  Culture, blood (routine x 2)     Status: None   Collection Time: 01/03/21  3:32 PM   Specimen: BLOOD LEFT HAND  Result Value Ref Range Status   Specimen Description   Final    BLOOD LEFT HAND Performed at [place] [address], [place], [place] [address]    Special Requests   Final    BOTTLES DRAWN AEROBIC ONLY Blood Culture adequate volume Performed at [place] [address], [place], [place] [address]    Culture   Final    NO GROWTH 5 DAYS Performed at [facility], [place] [address] [place], [place] [address]    Report Status 01/08/2021 FINAL  Final  Surgical pcr screen     Status: Abnormal   Collection Time: 01/07/21  7:52 AM   Specimen: Nasal Mucosa; Nasal Swab  Result Value Ref Range Status   MRSA, PCR POSITIVE (A) NEGATIVE Final    Comment: RESULT CALLED TO, READ BACK BY AND VERIFIED WITH: JT AT 1013 ON 01/07/2021 BY MOSLEY,J    Staphylococcus aureus POSITIVE (A) NEGATIVE Final    Comment: (NOTE) The Xpert SA Assay (FDA approved for NASAL specimens in patients 67 years of age and older), is one component of a comprehensive surveillance program. It is not intended to diagnose infection nor to guide or  monitor treatment. Performed at [facility], [place] [address], [place], [place] [address]          Radiology Studies: No results found.      Scheduled Meds: . Chlorhexidine Gluconate Cloth  6 each Topical Daily  . enoxaparin (LOVENOX) injection  40 mg Subcutaneous Q24H  . insulin aspart  0-5 Units Subcutaneous QHS  . insulin aspart  0-6 Units Subcutaneous TID WC  . metoprolol tartrate  2.5 mg Intravenous Q6H  . mupirocin ointment  1 application Nasal BID  . pantoprazole (PROTONIX) IV  40 mg Intravenous Q24H   Continuous Infusions: . dextrose 5 % and 0.45% NaCl 15 mL/hr at 01/09/21 1759  . TPN ADULT (ION) 75 mL/hr at 01/10/21 1707  . TPN ADULT (ION)       LOS: 7 days     Georgette Shell, MD  01/11/2021, 12:24 PM

## 2021-01-12 LAB — BASIC METABOLIC PANEL
Anion gap: 8 (ref 5–15)
BUN: 26 mg/dL — ABNORMAL HIGH (ref 8–23)
CO2: 24 mmol/L (ref 22–32)
Calcium: 8.6 mg/dL — ABNORMAL LOW (ref 8.9–10.3)
Chloride: 107 mmol/L (ref 98–111)
Creatinine, Ser: 0.91 mg/dL (ref 0.44–1.00)
GFR, Estimated: >60 mL/min (ref >60)
Glucose, Bld: 159 mg/dL — ABNORMAL HIGH (ref 70–99)
Potassium: 4.5 mmol/L (ref 3.5–5.1)
Sodium: 139 mmol/L (ref 135–145)

## 2021-01-12 LAB — GLUCOSE, CAPILLARY
Glucose-Capillary: 140 mg/dL — ABNORMAL HIGH (ref 70–99)
Glucose-Capillary: 141 mg/dL — ABNORMAL HIGH (ref 70–99)
Glucose-Capillary: 116 mg/dL — ABNORMAL HIGH (ref 70–99)
Glucose-Capillary: 102 mg/dL — ABNORMAL HIGH (ref 70–99)

## 2021-01-12 LAB — CBC
HCT: 23.6 % — ABNORMAL LOW (ref 36.0–46.0)
Hemoglobin: 7.6 g/dL — ABNORMAL LOW (ref 12.0–15.0)
MCH: 29.8 pg (ref 26.0–34.0)
MCHC: 32.2 g/dL (ref 30.0–36.0)
MCV: 92.5 fL (ref 80.0–100.0)
Platelets: 164 10*3/uL (ref 150–400)
RBC: 2.55 MIL/uL — ABNORMAL LOW (ref 3.87–5.11)
RDW: 13.3 % (ref 11.5–15.5)
WBC: 6.4 10*3/uL (ref 4.0–10.5)
nRBC: 0 % (ref 0.0–0.2)

## 2021-01-12 LAB — PREPARE RBC (CROSSMATCH)

## 2021-01-12 MED ORDER — SODIUM CHLORIDE 0.9% IV SOLUTION: Freq: Once | INTRAVENOUS | Status: AC

## 2021-01-12 MED ORDER — ADULT MULTIVITAMIN W/MINERALS CH
1.0000 | ORAL_TABLET | Freq: Every day | ORAL | Status: DC
  Administered 2021-01-12 – 2021-01-13 (×2): 1 via ORAL
  Filled 2021-01-12 (×2): qty 1

## 2021-01-12 MED ORDER — ENSURE ENLIVE PO LIQD
237.0000 mL | Freq: Three times a day (TID) | ORAL | Status: DC
  Administered 2021-01-12 – 2021-01-13 (×3): 237 mL via ORAL

## 2021-01-12 NOTE — Progress Notes (Signed)
Nutrition Follow-up  DOCUMENTATION CODES:  Non-severe (moderate) malnutrition in context of chronic illness  INTERVENTION:  Discontinue TPN.  Advance diet if medically able.  Add Ensure Enlive po TID, each supplement provides 350 kcal and 20 grams of protein.  Add Magic cup TID with meals, each supplement provides 290 kcal and 9 grams of protein.  Add MVI with minerals daily.  NUTRITION DIAGNOSIS:  Moderate Malnutrition related to chronic illness (dementia, T2DM, CKD) as evidenced by percent weight loss,mild fat depletion,moderate fat depletion,mild muscle depletion,moderate muscle depletion. - ongoing  GOAL:  Patient will meet greater than or equal to 90% of their needs - met with TPN, not meeting PO  MONITOR:  PO intake,Supplement acceptance,Diet advancement,Labs,Weight trends,I & O's,Skin  REASON FOR ASSESSMENT:  Consult New TPN/TNA  ASSESSMENT:  75 yo female with a PMH of dementia, CKD, CHB s/p PPM, T2DM, breast cancer, and vulvar cancer who presents with profound hypokalemia, AKI, and CT of abdomen/pelvis concerning for enteritis. 4/14 - abdominal x-ray showed SBO; NG tube placed 4/17 - ex lap w/ lysis of adhesion and SB resection 4/18 - started on TPN 4/19 - removed NG tube 4/21 - soft diet started  Spoke with pt at bedside. Pt reports having little appetite and being full, therefore not wanting to eat meals. She is agreeable to trying Ensure and Magic Cup as she transitions back to a diet and at home to help with repletion. Per Epic, pt eating 0-25% PO once starting diet.  Recommend discontinuing TPN tonight to allow for appetite to increase. Also recommend adding Ensure TID, Magic Cup TID, and MVI with minerals to promote intake before discharge. Encouraged pt to have Ensure BID at home to continue to replete losses.   Attached recommendations for increasing calories and protein, as well as resources for purchasing Ensure at home, to discharge summary.  Medications:  SSI, Protonix  Labs: reviewed; CBG 132-160 HbA1c: 6.3% (12/2020)  Diet Order:   Diet Order            DIET SOFT Room service appropriate? Yes; Fluid consistency: Thin  Diet effective now                EDUCATION NEEDS:  Education needs have been addressed  Skin:  Skin Assessment: Skin Integrity Issues: Skin Integrity Issues:: Incisions Incisions: Abdomen, closed  Last BM:  01/11/21  Height:  Ht Readings from Last 1 Encounters:  01/02/21 5' (1.524 m)   Weight:  Wt Readings from Last 1 Encounters:  01/02/21 54.4 kg   Ideal Body Weight:  45.5 kg  BMI:  Body mass index is 23.42 kg/m.  Estimated Nutritional Needs:  Kcal:  5638-7564 Protein:  65-80 grams Fluid:  >1.75 L  Derrel Nip, RD, LDN Registered Dietitian After Hours/Weekend Pager # in [place]

## 2021-01-12 NOTE — Care Management Important Message (Signed)
Important Message  Patient Details IM Letter given to the Patient. Name: [name] MRN: [id number] Date of Birth: 06-Apr-1946   Medicare Important Message Given:  Yes     Kerin Salen 01/12/2021, 11:14 AM

## 2021-01-12 NOTE — Progress Notes (Signed)
PROGRESS NOTE    [name]  PIR:[id number] DOB: 10-14-1945 DOA: 01/02/2021 PCP: Glenis Smoker, MD   Brief Narrative:75 year old F with PMH of dementia, CKD, CHB s/p PPM, DM-2, breast cancer and vulvar cancer presenting with nausea, vomiting and abdominal pain for over 24 hours after she had a seafood at a local restaurant.  She was admitted for profound hypokalemia, AKI and CT abdomen and pelvis concerning for enteritis.   Patient continued to have significant low abdominal pain.  Abdominal x-ray on 4/14 showed SBO.  General surgery consulted.  Started on SBO protocol with NG tube.  However, patient removed NG tube and refused reinsertion.  Repeat KUB with persistent SBO.  Patient underwent ex lap with lysis of adhesion and small bowel resection on 01/07/2021.  Started on TPN on 4/18.  NG tube discontinued on 4/19.  Anticipate discharge home in the next 48 to 72 hours  Assessment & Plan:   Active Problems:   Nausea and vomiting   SBO (small bowel obstruction) (HCC)   Palliative care by specialist   Malnutrition of moderate degree   Abdominal pain   Urinary tract infection without hematuria   #1 small bowel obstruction-status post ex lap with lysis of adhesions and small bowel resection 01/07/2021.  Patient was started on TPN due to prolonged postop ileus, starting soft diet 01/11/2021.  PPN to be stopped later today 01/12/2021 Blood transfusion 1 unit today plan for discharge tomorrow.  Discussed with family daughter.  #2?  Enteritis by CT scan of the abdomen he was treated with Unasyn for 5 days.  #3 AKI on CKD stage III AAA resolved this was likely from prerenal dehydration from GI losses and diuretics.  #4 hypernatremia resolved  #5  Dementia without behavioral disturbance patient has been very pleasant but oriented to self only.  #6 status post permanent pacemaker complete heart block-stable  #7 goals of care patient continues to be full code palliative care has been  consulted.  #8 thrombocytopenia mild monitor closely.  On Lovenox.  #9 history of breast cancer and ovarian cancer in remission.  #10 acute blood loss anemia her hemoglobin has been dropping slowly.  Today her hemoglobin is 7.6 on admission she was 14.  I will transfuse her 1 unit of packed RBC today.       Nutrition Problem: Moderate Malnutrition Etiology: chronic illness (dementia, T2DM, CKD)     Signs/Symptoms: percent weight loss,mild fat depletion,moderate fat depletion,mild muscle depletion,moderate muscle depletion Percent weight loss: 9.5 %    Interventions: Ensure Enlive (each supplement provides 350kcal and 20 grams of protein),Magic cup,MVI  Estimated body mass index is 23.42 kg/m as calculated from the following:   Height as of this encounter: 5' (1.524 m).   Weight as of this encounter: 54.4 kg.  DVT prophylaxis: Lovenox  code Status: Full code  family Communication: None at bedside  disposition Plan:  Status is: Inpatient  Dispo: The patient is from: Home              Anticipated d/c is to: Home 01/13/2021              Patient currently is not medically stable to d/c.   Difficult to place patient No   Consultants:   General surgery  Procedures: Status post exploratory lap  with lysis of adhesions 01/07/2021 Antimicrobials: None  Subjective: She is resting in bed in no acute distress had bowel movements no nausea vomiting on TPN Objective: Vitals:   01/12/21 6301  01/12/21 0921 01/12/21 1240 01/12/21 1325  BP: (!) 116/50 (!) 126/52 (!) 117/49 (!) 117/48  Pulse: 67 72 77 80  Resp: 14 16  16   Temp: 98.1 F (36.7 C) 98.1 F (36.7 C)  98.4 F (36.9 C)  TempSrc:  Oral  Oral  SpO2: 99% 100%  100%  Weight:      Height:        Intake/Output Summary (Last 24 hours) at 01/12/2021 1344 Last data filed at 01/12/2021 0600 Gross per 24 hour  Intake 2252.84 ml  Output 500 ml  Net 1752.84 ml   Filed Weights   01/02/21 1717  Weight: 54.4 kg     Examination:  General exam: Appears calm and comfortable  Respiratory system: Clear to auscultation. Respiratory effort normal. Cardiovascular system: S1 & S2 heard, RRR. No JVD, murmurs, rubs, gallops or clicks. No pedal edema. Gastrointestinal system: Abdomen is nondistended, soft and nontender. No organomegaly or masses felt. Normal bowel sounds heard.  Incision covered with dressing with some seepage through the dressing noted. Central nervous system: Alert and oriented. No focal neurological deficits. Extremities: Symmetric 5 x 5 power. Skin: No rashes, lesions or ulcers Psychiatry: Judgement and insight appear normal. Mood & affect appropriate.     Data Reviewed: I have personally reviewed following labs and imaging studies  CBC: Recent Labs  Lab 01/07/21 0340 01/08/21 0355 01/09/21 0447 01/10/21 2231 01/12/21 0442  WBC 7.6 11.4* 7.5 7.2 6.4  NEUTROABS  --   --  5.6  --   --   HGB 9.6* 8.2* 8.5* 8.5* 7.6*  HCT 30.2* 25.8* 27.4* 26.4* 23.6*  MCV 92.9 92.8 95.8 91.3 92.5  PLT 142* 133* 124* 155 557   Basic Metabolic Panel: Recent Labs  Lab 01/07/21 0340 01/08/21 0355 01/09/21 0447 01/10/21 0432 01/11/21 0446 01/12/21 0442  NA 147* 144 140 137 137 139  K 3.0* 4.3 4.0 4.1 4.3 4.5  CL 109 110 109 103 105 107  CO2 28 27 24 25 25 24   GLUCOSE 208* 207* 142* 152* 162* 159*  BUN 23 16 16 19 23  26*  CREATININE 1.10* 0.92 1.00 0.75 0.85 0.91  CALCIUM 8.4* 7.7* 8.2* 8.6* 8.7* 8.6*  MG 1.9 1.6* 2.2 1.8 1.9  --   PHOS 2.1* 2.2* 2.5 3.4 3.9  --    GFR: Estimated Creatinine Clearance: 37.8 mL/min (by C-G formula based on SCr of 0.91 mg/dL). Liver Function Tests: Recent Labs  Lab 01/06/21 0317 01/07/21 0340 01/08/21 0355 01/09/21 0447 01/10/21 0432 01/11/21 0446  AST 18  --  18 22 21 20   ALT 13  --  11 12 12 14   ALKPHOS 46  --  31* 35* 35* 37*  BILITOT 0.8  --  0.5 0.4 0.4 0.6  PROT 5.7*  --  4.6* 5.1* 5.1* 5.0*  ALBUMIN 3.0* 2.7* 2.4* 2.6* 2.6* 2.4*   No  results for input(s): LIPASE, AMYLASE in the last 168 hours. No results for input(s): AMMONIA in the last 168 hours. Coagulation Profile: No results for input(s): INR, PROTIME in the last 168 hours. Cardiac Enzymes: No results for input(s): CKTOTAL, CKMB, CKMBINDEX, TROPONINI in the last 168 hours. BNP (last 3 results) No results for input(s): PROBNP in the last 8760 hours. HbA1C: No results for input(s): HGBA1C in the last 72 hours. CBG: Recent Labs  Lab 01/11/21 1118 01/11/21 1729 01/11/21 2102 01/12/21 0758 01/12/21 1152  GLUCAP 172* 132* 139* 140* 141*   Lipid Profile: No results for input(s): CHOL, HDL,  LDLCALC, TRIG, CHOLHDL, LDLDIRECT in the last 72 hours. Thyroid Function Tests: No results for input(s): TSH, T4TOTAL, FREET4, T3FREE, THYROIDAB in the last 72 hours. Anemia Panel: No results for input(s): VITAMINB12, FOLATE, FERRITIN, TIBC, IRON, RETICCTPCT in the last 72 hours. Sepsis Labs: No results for input(s): PROCALCITON, LATICACIDVEN in the last 168 hours.  Recent Results (from the past 240 hour(s))  Culture, blood (routine x 2)     Status: None   Collection Time: 01/03/21  3:32 PM   Specimen: BLOOD  Result Value Ref Range Status   Specimen Description   Final    BLOOD RIGHT ANTECUBITAL Performed at [facility] [address], [place], [place] [address]    Special Requests   Final    BOTTLES DRAWN AEROBIC ONLY Blood Culture adequate volume Performed at [facility] [address], [place], [place] [address]    Culture   Final    NO GROWTH 5 DAYS Performed at [facility], [place] [address], [place], [place] [address]    Report Status 01/08/2021 FINAL  Final  Culture, blood (routine x 2)     Status: None   Collection Time: 01/03/21  3:32 PM   Specimen: BLOOD LEFT HAND  Result Value Ref Range Status   Specimen Description   Final    BLOOD LEFT HAND Performed at [facility] [address], [place], [place] [address]    Special Requests   Final    BOTTLES DRAWN AEROBIC ONLY Blood Culture adequate volume Performed at [facility] [address], [place], [place] [address]    Culture   Final    NO GROWTH 5 DAYS Performed at [facility], [place] [address] [place], [place] [address]    Report Status 01/08/2021 FINAL  Final  Surgical pcr screen     Status: Abnormal   Collection Time: 01/07/21  7:52 AM   Specimen: Nasal Mucosa; Nasal Swab  Result Value Ref Range Status   MRSA, PCR POSITIVE (A) NEGATIVE Final    Comment: RESULT CALLED TO, READ BACK BY AND VERIFIED WITH: JT AT 1013 ON 01/07/2021 BY MOSLEY,J    Staphylococcus aureus POSITIVE (A) NEGATIVE Final    Comment: (NOTE) The Xpert SA Assay (FDA approved for NASAL specimens in patients 67 years of age and older), is one component of a comprehensive surveillance program. It is not intended to diagnose infection nor to guide or monitor treatment. Performed at [facility], [place] [address] [place], [place] [address]          Radiology Studies: No results found.      Scheduled Meds: . Chlorhexidine Gluconate Cloth  6 each Topical Daily  . enoxaparin (LOVENOX) injection  40 mg Subcutaneous Q24H  . feeding supplement  237 mL Oral TID BM  . insulin aspart  0-5 Units Subcutaneous QHS  . insulin aspart  0-6 Units Subcutaneous TID WC  . metoprolol tartrate  2.5 mg Intravenous Q6H  . multivitamin with minerals  1 tablet Oral Daily  . pantoprazole (PROTONIX) IV  40 mg Intravenous Q24H   Continuous Infusions: . dextrose 5 % and 0.45% NaCl 15 mL/hr at 01/09/21 1759  . TPN ADULT (ION) 50 mL/hr at 01/11/21 1735     LOS: 8 days     Georgette Shell, MD  01/12/2021, 1:44 PM

## 2021-01-12 NOTE — Progress Notes (Signed)
    5 Days Post-Op  Subjective: Tolerating soft diet. Had a BM charted yesterday. Afebrile.  Objective: Vital signs in last 24 hours: Temp:  [97.4 F (36.3 C)-98.1 F (36.7 C)] 98.1 F (36.7 C) (04/22 0921) Pulse Rate:  [67-75] 72 (04/22 0921) Resp:  [14-16] 16 (04/22 0921) BP: (114-126)/(50-59) 126/52 (04/22 0921) SpO2:  [98 %-100 %] 100 % (04/22 0921) Last BM Date: 01/11/21  Intake/Output from previous day: 04/21 0701 - 04/22 0700 In: 2852.8 [P.O.:1080; I.V.:1772.8] Out: 700 [Urine:700] Intake/Output this shift: No intake/output data recorded.  PE: General: resting comfortably, NAD Resp: normal work of breathing Abdomen: soft, nondistended, non-tender. Midline incision clean, dry and in tact with staples in place. No erythema or induration. Extremities: warm and well-perfused  Lab Results:  Recent Labs    01/10/21 2231 01/12/21 0442  WBC 7.2 6.4  HGB 8.5* 7.6*  HCT 26.4* 23.6*  PLT 155 164   BMET Recent Labs    01/11/21 0446 01/12/21 0442  NA 137 139  K 4.3 4.5  CL 105 107  CO2 25 24  GLUCOSE 162* 159*  BUN 23 26*  CREATININE 0.85 0.91  CALCIUM 8.7* 8.6*   PT/INR No results for input(s): LABPROT, INR in the last 72 hours. CMP     Component Value Date/Time   NA 139 01/12/2021 0442   NA 139 09/01/2018 1550   NA 138 11/22/2013 1447   K 4.5 01/12/2021 0442   K 4.2 11/22/2013 1447   CL 107 01/12/2021 0442   CL 104 11/03/2012 1419   CO2 24 01/12/2021 0442   CO2 28 11/22/2013 1447   GLUCOSE 159 (H) 01/12/2021 0442   GLUCOSE 319 (H) 11/22/2013 1447   GLUCOSE 119 (H) 11/03/2012 1419   BUN 26 (H) 01/12/2021 0442   BUN 13 09/01/2018 1550   BUN 11.5 11/22/2013 1447   CREATININE 0.91 01/12/2021 0442   CREATININE 1.30 (H) 12/14/2020 1117   CREATININE 0.8 11/22/2013 1447   CALCIUM 8.6 (L) 01/12/2021 0442   CALCIUM 10.0 11/22/2013 1447   PROT 5.0 (L) 01/11/2021 0446   PROT 6.9 11/22/2013 1447   ALBUMIN 2.4 (L) 01/11/2021 0446   ALBUMIN 3.5  11/22/2013 1447   AST 20 01/11/2021 0446   AST 11 11/22/2013 1447   ALT 14 01/11/2021 0446   ALT 13 11/22/2013 1447   ALKPHOS 37 (L) 01/11/2021 0446   ALKPHOS 94 11/22/2013 1447   BILITOT 0.6 01/11/2021 0446   BILITOT 0.20 11/22/2013 1447   GFRNONAA >60 01/12/2021 0442   GFRNONAA 43 (L) 12/14/2020 1117   GFRAA >60 06/21/2020 1423   Lipase     Component Value Date/Time   LIPASE 15 07/17/2010 1628      Assessment/Plan 75 yo female with SBO, POD5 s/p ex lap, lysis of adhesions and small bowel resection 01/07/21 Dr. Marlou Starks  - Having bowel function and tolerating diet - Discontinue TPN - [place] for discharge when deemed appropriate by primary team. Surgery will sign off at this time, please do not hesitate to call with any further questions or concerns.   LOS: 8 days    Michaelle Birks, MD [facility] General, Hepatobiliary and Pancreatic Surgery 01/12/21 11:05 AM

## 2021-01-12 NOTE — Discharge Instructions (Signed)
[facility]      [facility] [phone]  OPEN ABDOMINAL SURGERY: POST OP INSTRUCTIONS  Always review your discharge instruction sheet given to you by the facility where your surgery was performed.  IF YOU HAVE DISABILITY OR FAMILY LEAVE FORMS, YOU MUST BRING THEM TO THE OFFICE FOR PROCESSING.  PLEASE DO NOT GIVE THEM TO YOUR DOCTOR.  1. A prescription for pain medication may be given to you upon discharge.  Take your pain medication as prescribed, if needed.  If narcotic pain medicine is not needed, then you may take acetaminophen (Tylenol) or ibuprofen (Advil) as needed. 2. Take your usually prescribed medications unless otherwise directed. 3. If you need a refill on your pain medication, please contact your pharmacy. They will contact our office to request authorization.  Prescriptions will not be filled after 5pm or on week-ends. 4. You should follow a light diet the first few days after arrival home, such as soup and crackers, pudding, etc.unless your doctor has advised otherwise. A high-fiber, low fat diet can be resumed as tolerated.   Be sure to include lots of fluids daily. Most patients will experience some swelling and bruising on the chest and neck area.  Ice packs will help.  Swelling and bruising can take several days to resolve 5. Most patients will experience some swelling and bruising in the area of the incision. Ice pack will help. Swelling and bruising can take several days to resolve..  6. It is common to experience some constipation if taking pain medication after surgery.  Increasing fluid intake and taking a stool softener will usually help or prevent this problem from occurring.  A mild laxative (Milk of Magnesia or Miralax) should be taken according to package directions if there are no bowel movements after 48 hours. 7.  You may have steri-strips (small skin tapes) in place directly over the incision.  These strips should be left on the skin for 7-10 days.  If your  surgeon used skin glue on the incision, you may shower in 24 hours.  The glue will flake off over the next 2-3 weeks.  Any sutures or staples will be removed at the office during your follow-up visit. You may find that a light gauze bandage over your incision may keep your staples from being rubbed or pulled. You may shower and replace the bandage daily. 8. ACTIVITIES:  You may resume regular (light) daily activities beginning the next day--such as daily self-care, walking, climbing stairs--gradually increasing activities as tolerated.  You may have sexual intercourse when it is comfortable.  Refrain from any heavy lifting or straining until approved by your doctor. a. You may drive when you no longer are taking prescription pain medication, you can comfortably wear a seatbelt, and you can safely maneuver your car and apply brakes b. Return to Work: ___________________________________ 9. You should see your doctor in the office for a follow-up appointment approximately two weeks after your surgery.  Make sure that you call for this appointment within a day or two after you arrive home to insure a convenient appointment time. OTHER INSTRUCTIONS:  _____________________________________________________________ _____________________________________________________________  WHEN TO CALL YOUR DOCTOR: 1. Fever over 101.0 2. Inability to urinate 3. Nausea and/or vomiting 4. Extreme swelling or bruising 5. Continued bleeding from incision. 6. Increased pain, redness, or drainage from the incision. 7. Difficulty swallowing or breathing 8. Muscle cramping or spasms. 9. Numbness or tingling in hands or feet or around lips.  The clinic staff is available to   answer your questions during regular business hours.  Please don't hesitate to call and ask to speak to one of the nurses if you have concerns.  For further questions, please visit [email or web address]  Suggestions For Increasing Calories And  Protein . Several small meals a day are easier to eat and digest than three large ones. Space meals about 2 to 3 hours apart to maximize comfort. . Stop eating 2 to 3 hours before bed and sleep with your head elevated if gastric reflux (GERD) and heartburn are problems. . Do not eat your favorite foods if you are feeling bad. Save them for when you feel good! . Eat breakfast-type foods at any meal. Eggs are usually easy to eat and are great any time of the day. (The same goes for pancakes and waffles.) . Eat when you feel hungry. Most people have the greatest appetite in the morning because they have not eaten all night. If this is the best meal for you, then pile on those calories and other nutrients in the morning and at lunch. Then you can have a smaller dinner without losing total calories for the day. . Eat leftovers or nutritious snacks in the afternoon and early evening to round out your day. . Try homemade or commercially prepared nutrition bars and puddings, as well as calorie- and protein-rich liquid nutritional supplements. Benefits of Physical Activity Talk to your doctor about physical activity. Light or moderate physical activity can help maintain muscle and promote an appetite. Walking in the neighborhood or the local mall is a great way to get up, get out, and get moving. If you are unsteady on your feet, try walking around the dining room table. Save Room for [employer]! Drink most fluids between meals instead of with meals. (It is fine to have a sip to help swallow food at meal time.) Fluids (which usually have fewer calories and nutrients than solid food) can take up valuable space in your stomach.  Foods Recommended High-Protein Foods Milk products Add cheese to toast, crackers, sandwiches, baked potatoes, vegetables, soups, noodles, meat, and fruit. Use reduced-fat (2%) or whole milk in place of water when cooking cereal and cream soups. Include cream sauces on vegetables  and pasta. Add powdered milk to cream soups and mashed potatoes.  Eggs Have hard-cooked eggs readily available in the refrigerator. Chop and add to salads, casseroles, soups, and vegetables. Make a quick egg salad. All eggs should be well cooked to avoid the risk of harmful bacteria.  Meats, poultry, and fish Add leftover cooked meats to soups, casseroles, salads, and omelets. Make dip by mixing diced, chopped, or shredded meat with sour cream and spices.  Beans, legumes, nuts, and seeds Sprinkle nuts and seeds on cereals, fruit, and desserts such as ice cream, pudding, and custard. Also serve nuts and seeds on vegetables, salads, and pasta. Spread peanut butter on toast, bread, English muffins, and fruit, or blend it in a milk shake. Add beans and peas to salads, soups, casseroles, and vegetable dishes.  High-Calorie Foods Butter, margarine, and  oils Melt butter or margarine over potatoes, rice, pasta, and cooked vegetables. Add melted butter or margarine into soups and casseroles and spread on bread for sandwiches before spreading sandwich spread or peanut butter. Saut or stir-fry vegetables, meats, chicken and fish such as shrimp/scallops in olive or canola oil. A variety of oils add calories and can be used to [employer], chicken, or fish.  Milk products Add whipping cream to desserts, pancakes, waffles,  fruit, and hot chocolate, and fold it into soups and casseroles. Add sour cream to baked potatoes and vegetables.  Salad dressing Use regular (not low-fat or diet) mayonnaise and salad dressing on sandwiches and in dips with vegetables and fruit.   Sweets Add jelly and honey to bread and crackers. Add jam to fruit and ice cream and as a topping over cake.   Copyright 2020  Academy of [employer] Dietetics. All rights reserved.  [facility] Stay Proper nutrition can help your body recover from illness and injury.   Foods and beverages high in protein, vitamins, and minerals help  rebuild muscle loss, promote healing, & reduce fall risk.   .In addition to eating healthy foods, a nutrition shake is an easy, delicious way to get the nutrition you need during and after your hospital stay  It is recommended that you continue to drink 2 bottles per day of: Ensure for at least 1 month (30 days) after your hospital stay   Tips for adding a nutrition shake into your routine: As allowed, drink one with vitamins or medications instead of water or juice Enjoy one as a tasty mid-morning or afternoon snack Drink cold or make a milkshake out of it Drink one instead of milk with cereal or snacks Use as a coffee creamer   Available at the following grocery stores and pharmacies:           * [facility] [phone]            For COUPONS visit: www.ensure.com/join or http://dawson-may.com/   Suggested Substitutions Ensure Plus = Boost Plus = Carnation Breakfast Essentials = Boost Compact Ensure Active Clear = Boost Breeze Glucerna Shake = Boost Glucose Control = Carnation Breakfast Essentials SUGAR FREE

## 2021-01-12 NOTE — Progress Notes (Signed)
Unable to reaccessed PAC at this time, blood transfusion ongoing. RN will consult for PAC reaccess when BT is finished.

## 2021-01-13 LAB — BPAM RBC
Blood Product Expiration Date: 202205182359
ISSUE DATE / TIME: 202204221530
Unit Type and Rh: 5100

## 2021-01-13 LAB — CBC
HCT: 28.5 % — ABNORMAL LOW (ref 36.0–46.0)
Hemoglobin: 9.2 g/dL — ABNORMAL LOW (ref 12.0–15.0)
MCH: 29.6 pg (ref 26.0–34.0)
MCHC: 32.3 g/dL (ref 30.0–36.0)
MCV: 91.6 fL (ref 80.0–100.0)
Platelets: 197 10*3/uL (ref 150–400)
RBC: 3.11 MIL/uL — ABNORMAL LOW (ref 3.87–5.11)
RDW: 13.9 % (ref 11.5–15.5)
WBC: 9 10*3/uL (ref 4.0–10.5)
nRBC: 0 % (ref 0.0–0.2)

## 2021-01-13 LAB — TYPE AND SCREEN
ABO/RH(D): O POS
Antibody Screen: NEGATIVE
Unit division: 0

## 2021-01-13 LAB — GLUCOSE, CAPILLARY
Glucose-Capillary: 90 mg/dL (ref 70–99)
Glucose-Capillary: 90 mg/dL (ref 70–99)

## 2021-01-13 LAB — HEMOGLOBIN AND HEMATOCRIT, BLOOD
HCT: 27.2 % — ABNORMAL LOW (ref 36.0–46.0)
Hemoglobin: 8.8 g/dL — ABNORMAL LOW (ref 12.0–15.0)

## 2021-01-13 MED ORDER — ONDANSETRON HCL 4 MG PO TABS: 4.0000 mg | ORAL_TABLET | Freq: Four times a day (QID) | ORAL | 0 refills | Status: AC | PRN

## 2021-01-13 MED ORDER — TRAMADOL HCL 50 MG PO TABS: 50.0000 mg | ORAL_TABLET | Freq: Four times a day (QID) | ORAL | 0 refills | Status: AC | PRN

## 2021-01-13 MED ORDER — LOPERAMIDE HCL 2 MG PO CAPS
4.0000 mg | ORAL_CAPSULE | Freq: Once | ORAL | Status: AC
  Administered 2021-01-13: 4 mg via ORAL
  Filled 2021-01-13: qty 2

## 2021-01-13 MED ORDER — HEPARIN SOD (PORK) LOCK FLUSH 100 UNIT/ML IV SOLN
500.0000 [IU] | INTRAVENOUS | Status: DC | PRN
  Filled 2021-01-13: qty 5

## 2021-01-13 NOTE — TOC Progression Note (Signed)
Transition of Care [facility]) - Progression Note    Patient Details  Name: [name] MRN: [id number] Date of Birth: 12-22-1945  Transition of [facility]) CM/SW Contact  Joaquin Courts, RN Phone Number: 01/13/2021, 12:59 PM  Clinical Narrative:    Referral for HHPT given to [facility] rep Tommi Rumps.   Expected Discharge Plan: [place] (vs. Home/ Self Care) Barriers to Discharge: No Barriers Identified  Expected Discharge Plan and Services Expected Discharge Plan: [place] (vs. Home/ Self Care)       Living arrangements for the past 2 months: Single Family Home Expected Discharge Date: 01/13/21                         HH Arranged: PT HH Agency: [place] Date [place]: 01/13/21 Time [place]: 2725 Representative spoke with at [place]: [place] ([place]) Interventions    Readmission Risk Interventions Readmission Risk Prevention Plan 01/10/2021  Transportation Screening Complete  PCP or Specialist Appt within 5-7 Days Complete  Home Care Screening Complete  Some recent data might be hidden

## 2021-01-13 NOTE — Plan of Care (Signed)
Reviewed written d/c instructions w pt's son Alverda Skeans) and her husband. All questions answered and they both verbalized understanding. Two paper scripts given to son (Tramadol and Zofran). Pt d/c per w/c with all belongings w family in stable condition.

## 2021-01-13 NOTE — Discharge Summary (Signed)
Physician Discharge Summary  [name] [id number] DOB: Jan 26, 1946 DOA: 01/02/2021  PCP: Glenis Smoker, MD  Admit date: 01/02/2021 Discharge date: 01/13/2021  Admitted From: Home Disposition: Home  Recommendations for Outpatient Follow-up:  1. Follow up with PCP in 1-2 weeks 2. Please obtain BMP/CBC in one week 3. Please follow up with general surgery on 01/22/2021 for staple removal and Dr. Marlou Starks  Home Health: Yes Equipment/Devices: None Discharge Condition stable  CODE STATUS: Full code Diet recommendation: Cardiac diet Brief/Interim Summary:75 year old F with PMH of dementia, CKD, CHB s/p PPM, DM-2, breast cancer and vulvar cancer presenting with nausea, vomiting and abdominal pain for over 24 hours after she had a seafood at a [employer]. She was admitted for profound hypokalemia, AKI and CT abdomen and pelvis concerning for enteritis.   Patient continued to have significant low abdominal pain. Abdominal x-ray on 4/14 showed SBO. General surgery consulted. Started on SBO protocol with NG tube. However, patient removed NG tube and refused reinsertion. Repeat KUB with persistent SBO. Patient underwent ex lap with lysis of adhesion and small bowel resection on 01/07/2021. Started on TPN on 4/18.NG tube discontinued on 4/19.   Discharge Diagnoses:  Active Problems:   Nausea and vomiting   SBO (small bowel obstruction) (HCC)   Palliative care by specialist   Malnutrition of moderate degree   Abdominal pain   Urinary tract infection without hematuria     #1 small bowel obstruction-status post ex lap with lysis of adhesions and small bowel resection 01/07/2021.  Patient was started on TPN due to prolonged postop ileus, starting soft diet 01/11/2021.   TPN was stopped on 01/12/2021.  She was able to tolerate a regular diet prior to discharge.    #2?  Enteritis by CT scan of the abdomen he was treated with Unasyn for 5 days.  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  #4 hypernatremia resolved  #5  Dementia without behavioral disturbance patient has been very pleasant but oriented to self only.  #6 status post permanent pacemaker complete heart block-stable  #7 goals of care patient continues to be full code  #8 thrombocytopenia resolved  #9 history of breast cancer and ovarian cancer in remission.  #10 acute blood loss anemia -she received 1 unit of blood transfusion during this hospital stay.  On the day of discharge her hemoglobin was 9.2.       Nutrition Problem: Moderate Malnutrition Etiology: chronic illness (dementia, T2DM, CKD)    Signs/Symptoms: percent weight loss,mild fat depletion,moderate fat depletion,mild muscle depletion,moderate muscle depletion Percent weight loss: 9.5 %     Interventions: Ensure Enlive (each supplement provides 350kcal and 20 grams of protein),Magic cup,MVI  Estimated body mass index is 23.42 kg/m as calculated from the following:   Height as of this encounter: 5' (1.524 m).   Weight as of this encounter: 54.4 kg.  Discharge Instructions  Discharge Instructions    Diet - low sodium heart healthy   Complete by: As directed    Increase activity slowly   Complete by: As directed    No wound care   Complete by: As directed      Allergies as of 01/13/2021      Reactions   Duloxetine Itching   Cymbalta      Medication List    TAKE these medications   donepezil 10 MG tablet Commonly known as: ARICEPT Take 10 mg by mouth at  bedtime.   ergocalciferol 1.25 MG (50000 UT) capsule Commonly known as: VITAMIN D2 Take 50,000 Units by mouth every Tuesday.   Fish Oil 1200 MG Caps Take 2,400 mg by mouth every morning.   flecainide 50 MG tablet Commonly known as: TAMBOCOR Take 1 tablet (50 mg total) by mouth 2 (two) times daily.   metoprolol tartrate 25 MG tablet Commonly known as: LOPRESSOR Take 1 tablet  (25 mg total) by mouth 2 (two) times daily.   ondansetron 4 MG tablet Commonly known as: ZOFRAN Take 1 tablet (4 mg total) by mouth every 6 (six) hours as needed for nausea.   sertraline 100 MG tablet Commonly known as: Zoloft Take 1 tablet (100 mg total) by mouth daily.   traMADol 50 MG tablet Commonly known as: ULTRAM Take 1 tablet (50 mg total) by mouth every 6 (six) hours as needed for severe pain.   VITAMIN B 12 PO Take 1,000 mcg by mouth daily.       Follow-up [facility], [place]. Go on 01/22/2021.   Specialty: General Surgery Why: at 2:00 PM for a nurse visit for staple removal. please arrive 30 minutes early (1:30 PM) to get checked in and fill out any necessary paperwork. Contact information: [address] [place] [phone]       Autumn Messing III, MD Follow up on 02/06/2021.   Specialty: General Surgery Why: at 10:00 AM for post-operative follow up with your surgeon. please arrive 15 minutes early. Contact information: [address] [place] [address] [phone]        Glenis Smoker, MD Follow up.   Specialty: Family Medicine Contact information: [address] [place] [address] [phone]              Allergies  Allergen Reactions  . Duloxetine Itching    Cymbalta    Consultations:  General surgery   Procedures/Studies: DG Abd 1 View  Result Date: 01/06/2021 CLINICAL DATA:  Confirm nasogastric tube placement. EXAM: ABDOMEN - 1 VIEW COMPARISON:  01/06/2021 at 10:06 a.m. FINDINGS: Nasogastric tube has been placed, tip overlying the level of the stomach. There is significant persistent dilatation of small bowel loops. Paucity of gas in the colon. Findings are consistent persistent small bowel obstruction. No free intraperitoneal air. IMPRESSION: Nasogastric tube to the stomach. Persistent high-grade small bowel obstruction. Electronically  Signed   By: Nolon Nations M.D.   On: 01/06/2021 18:54   CT ABDOMEN PELVIS W CONTRAST  Result Date: 01/02/2021 CLINICAL DATA:  Acute abdominal pain. Nonlocalized. History of breast cancer. EXAM: CT ABDOMEN AND PELVIS WITH CONTRAST TECHNIQUE: Multidetector CT imaging of the abdomen and pelvis was performed using the standard protocol following bolus administration of intravenous contrast. CONTRAST:  163mL OMNIPAQUE IOHEXOL 300 MG/ML  SOLN COMPARISON:  None. FINDINGS: Lower chest: Lung bases are clear. Hepatobiliary: No focal hepatic lesion. No biliary duct dilatation. Common bile duct is normal. Pancreas: Pancreas is normal. No ductal dilatation. No pancreatic inflammation. Spleen: Normal spleen Adrenals/urinary tract: Adrenal glands and kidneys are normal. The ureters and bladder normal. Stomach/Bowel: Small hiatal hernia. Stomach and duodenum normal. The small bowel is fluid-filled. No mucosal enhancement or abnormal dilatation. Proximal small bowel measures up to 2.8 cm which is within normal limits. Distal small bowel is small caliber leading up to the terminal ileum. Small amount of free fluid in the RIGHT lower quadrant. Appendix is not identified. Ascending, transverse and  descending colon normal. Descending colon relatively collapsed. Small amount fluid along the descending colon (image 34/2). Small fluid collection pelvis additionally. Vascular/Lymphatic: Abdominal aorta is normal caliber with atherosclerotic calcification. There is no retroperitoneal or periportal lymphadenopathy. No pelvic lymphadenopathy. Reproductive: Post hysterectomy.  Adnexa unremarkable Other: free fluid collects in the pelvis with simple attenuation. No organization Musculoskeletal: No aggressive osseous lesion. IMPRESSION: 1. Prominent fluid-filled loops of small bowel aer upper limits of normal diameter. Small free fluid along the pericolic gutters and collecting in the pelvis. Findings are nonspecific could indicate  enteritis. No evidence of high-grade obstruction. 2. Appendix not identified but no secondary signs of acute appendicitis. 3. Gallbladder normal. 4. Post hysterectomy. Electronically Signed   By: Suzy Bouchard M.D.   On: 01/02/2021 12:23   DG Chest Port 1 View  Result Date: 01/02/2021 CLINICAL DATA:  Weakness, abdominal pain.  Vomiting, fever. EXAM: PORTABLE CHEST 1 VIEW COMPARISON:  09/12/2018 FINDINGS: Right Port-A-Cath in place with the tip in the SVC. Left pacer is unchanged. Heart is normal size. Lungs clear. No effusions or edema. No acute bony abnormality. IMPRESSION: No active disease. Electronically Signed   By: Rolm Baptise M.D.   On: 01/02/2021 10:11   DG Abd Portable 1V  Result Date: 01/07/2021 CLINICAL DATA:  Small bowel obstruction EXAM: PORTABLE ABDOMEN - 1 VIEW COMPARISON:  Yesterday FINDINGS: No progression of enteric contrast which has become more dilute. There is continued marked small bowel dilatation. Persistent contrast seen in the urinary bladder. The enteric tube is no longer seen. IMPRESSION: High-grade small bowel obstruction with no progression of enteric contrast. The enteric tube is no longer seen. Electronically Signed   By: Monte Fantasia M.D.   On: 01/07/2021 04:42   DG Abd Portable 1V  Result Date: 01/06/2021 CLINICAL DATA:  Small bowel obstruction. EXAM: PORTABLE ABDOMEN - 1 VIEW COMPARISON:  01/05/2021 FINDINGS: Persistent dilated loops of small bowel containing oral contrast. Bowel gas pattern has not changed. Small bowel loops measure up to 5.4 cm. Persistent oral contrast in the stomach and no significant contrast in the colon. No significant gas in the colon. Limited evaluation for free air on the supine images. Multiple surgical clips in both inguinal regions. IMPRESSION: No significant change the bowel gas pattern. There continues to be markedly dilated loops of small bowel containing oral contrast. Findings are compatible with a high-grade small bowel  obstruction. Electronically Signed   By: Markus Daft M.D.   On: 01/06/2021 11:06   DG Abd Portable 1V-Small Bowel Obstruction Protocol-initial, 8 hr delay  Result Date: 01/05/2021 CLINICAL DATA:  Small-bowel obstruction. Eight hour follow-up image. EXAM: PORTABLE ABDOMEN - 1 VIEW COMPARISON:  01/05/2021 at 10:37 a.m. FINDINGS: Contrast is identified within numerous dilated small bowel loops. There is no evidence for colonic contrast, suspicious for significant partial or complete small bowel obstruction. No evidence for free intraperitoneal air. IMPRESSION: Persistent significant dilatation of small bowel loops consistent with obstruction, either complete or partial. Recommend follow-up plain film of the abdomen in 12 hours. No free intraperitoneal air. Electronically Signed   By: Nolon Nations M.D.   On: 01/05/2021 21:12   DG Abd Portable 1V  Result Date: 01/05/2021 CLINICAL DATA:  Small-bowel obstruction and nasogastric tube placement. EXAM: PORTABLE ABDOMEN - 1 VIEW COMPARISON:  Film earlier today at 0724 hours FINDINGS: Nasogastric tube has been placed which is coiled in the proximal stomach below the diaphragm. Persistent dilated loops small bowel noted in the upper to mid abdomen. IMPRESSION: Nasogastric  tube coiled in the proximal stomach. Persistent dilated small bowel loops. Electronically Signed   By: Aletta Edouard M.D.   On: 01/05/2021 12:43   DG Abd Portable 1V  Result Date: 01/05/2021 CLINICAL DATA:  Small-bowel obstruction EXAM: PORTABLE ABDOMEN - 1 VIEW COMPARISON:  Portable exam 0724 hours compared to 01/04/2021 FINDINGS: Persistent gaseous distension of small bowel loops in the upper and mid abdomen consistent with small bowel obstruction. Overall pattern in degree of dilatation is unchanged from the previous exam. Minimal colonic gas and RIGHT colon stool. No bowel wall thickening. Bones demineralized. Surgical clips at the inguinal regions bilaterally. IMPRESSION: Persistent small  bowel obstruction. Electronically Signed   By: Lavonia Dana M.D.   On: 01/05/2021 08:14   DG Abd Portable 1V  Result Date: 01/04/2021 CLINICAL DATA:  Nausea, vomiting. EXAM: PORTABLE ABDOMEN - 1 VIEW COMPARISON:  January 03, 2021. FINDINGS: Dilated small bowel loops are noted concerning for distal small bowel obstruction. No colonic dilatation is noted. Surgical clips are noted overlying both hip regions. IMPRESSION: Dilated small bowel loops are noted concerning for distal small bowel obstruction. Electronically Signed   By: Marijo Conception M.D.   On: 01/04/2021 09:37   DG Abd Portable 1V  Result Date: 01/03/2021 CLINICAL DATA:  Ileus EXAM: PORTABLE ABDOMEN - 1 VIEW COMPARISON:  CT 01/02/2021 FINDINGS: Air is seen within nondilated loops of large and small bowel within the abdomen. No evidence of obstruction. Excreted contrast is present within the urinary bladder. No radio-opaque calculi or other significant radiographic abnormality are seen. IMPRESSION: Nonobstructive bowel gas pattern. Electronically Signed   By: Davina Poke D.O.   On: 01/03/2021 09:51    (Echo, Carotid, EGD, Colonoscopy, ERCP)    Subjective:  Patient resting in bed anxious to go home Discharge Exam: Vitals:   01/13/21 0022 01/13/21 0513  BP: (!) 128/55 (!) 126/54  Pulse:  87  Resp:  16  Temp:  98 F (36.7 C)  SpO2:  99%   Vitals:   01/12/21 1900 01/12/21 2118 01/13/21 0022 01/13/21 0513  BP: (!) 122/53 (!) 119/50 (!) 128/55 (!) 126/54  Pulse: 86 86  87  Resp: 17 16  16   Temp: 98.9 F (37.2 C) 98.7 F (37.1 C)  98 F (36.7 C)  TempSrc: Oral Oral    SpO2: 98% 100%  99%  Weight:      Height:        General: Pt is alert, awake, not in acute distress Cardiovascular: RRR, S1/S2 +, no rubs, no gallops Respiratory: CTA bilaterally, no wheezing, no rhonchi Abdominal: Soft, NT, ND, bowel sounds + Staples in place. Extremities: no edema, no cyanosis    The results of significant diagnostics from this  hospitalization (including imaging, microbiology, ancillary and laboratory) are listed below for reference.     Microbiology: Recent Results (from the past 240 hour(s))  Culture, blood (routine x 2)     Status: None   Collection Time: 01/03/21  3:32 PM   Specimen: BLOOD  Result Value Ref Range Status   Specimen Description   Final    BLOOD RIGHT ANTECUBITAL Performed at [facility] [address] [place], [place] [address]    Special Requests   Final    BOTTLES DRAWN AEROBIC ONLY Blood Culture adequate volume Performed at [facility] [address] [place], [place] [address]    Culture   Final    NO GROWTH 5 DAYS Performed at [facility],  [address] [place], [place] [address]    Report Status 01/08/2021 FINAL  Final  Culture, blood (routine x 2)     Status: None   Collection Time: 01/03/21  3:32 PM   Specimen: BLOOD LEFT HAND  Result Value Ref Range Status   Specimen Description   Final    BLOOD LEFT HAND Performed at [facility] [address], [place], [place] [address]    Special Requests   Final    BOTTLES DRAWN AEROBIC ONLY Blood Culture adequate volume Performed at [facility] [address] [place], [place] [address]    Culture   Final    NO GROWTH 5 DAYS Performed at [facility], [place] [address] [place], [place] [address]    Report Status 01/08/2021 FINAL  Final  Surgical pcr screen     Status: Abnormal   Collection Time: 01/07/21  7:52 AM   Specimen: Nasal Mucosa; Nasal Swab  Result Value Ref Range Status   MRSA, PCR POSITIVE (A) NEGATIVE Final    Comment: RESULT CALLED TO, READ BACK BY AND VERIFIED WITH: JT AT 1013 ON 01/07/2021 BY MOSLEY,J    Staphylococcus aureus POSITIVE (A) NEGATIVE Final    Comment: (NOTE) The Xpert SA Assay (FDA approved for NASAL specimens in patients 36 years of age and older), is one component of a comprehensive surveillance  program. It is not intended to diagnose infection nor to guide or monitor treatment. Performed at [facility], [place] [address] [place], [place] [address]      Labs: BNP (last 3 results) No results for input(s): BNP in the last 8760 hours. Basic Metabolic Panel: Recent Labs  Lab 01/07/21 0340 01/08/21 0355 01/09/21 0447 01/10/21 0432 01/11/21 0446 01/12/21 0442  NA 147* 144 140 137 137 139  K 3.0* 4.3 4.0 4.1 4.3 4.5  CL 109 110 109 103 105 107  CO2 28 27 24 25 25 24   GLUCOSE 208* 207* 142* 152* 162* 159*  BUN 23 16 16 19 23  26*  CREATININE 1.10* 0.92 1.00 0.75 0.85 0.91  CALCIUM 8.4* 7.7* 8.2* 8.6* 8.7* 8.6*  MG 1.9 1.6* 2.2 1.8 1.9  --   PHOS 2.1* 2.2* 2.5 3.4 3.9  --    Liver Function Tests: Recent Labs  Lab 01/07/21 0340 01/08/21 0355 01/09/21 0447 01/10/21 0432 01/11/21 0446  AST  --  18 22 21 20   ALT  --  11 12 12 14   ALKPHOS  --  31* 35* 35* 37*  BILITOT  --  0.5 0.4 0.4 0.6  PROT  --  4.6* 5.1* 5.1* 5.0*  ALBUMIN 2.7* 2.4* 2.6* 2.6* 2.4*   No results for input(s): LIPASE, AMYLASE in the last 168 hours. No results for input(s): AMMONIA in the last 168 hours. CBC: Recent Labs  Lab 01/08/21 0355 01/09/21 0447 01/10/21 2231 01/12/21 0442 01/12/21 2300 01/13/21 0317  WBC 11.4* 7.5 7.2 6.4  --  9.0  NEUTROABS  --  5.6  --   --   --   --   HGB 8.2* 8.5* 8.5* 7.6* 8.8* 9.2*  HCT 25.8* 27.4* 26.4* 23.6* 27.2* 28.5*  MCV 92.8 95.8 91.3 92.5  --  91.6  PLT 133* 124* 155 164  --  197   Cardiac Enzymes: No results for input(s): CKTOTAL, CKMB, CKMBINDEX, TROPONINI in the last 168 hours. BNP: Invalid input(s): POCBNP CBG: Recent Labs  Lab 01/12/21 1152 01/12/21 1615 01/12/21 2119 01/13/21 0735 01/13/21  1204  GLUCAP 141* 116* 102* 90 90   D-Dimer No results for input(s): DDIMER in the last 72 hours. Hgb A1c No results for input(s): HGBA1C in the last 72 hours. Lipid Profile No results for input(s): CHOL, HDL, LDLCALC, TRIG,  CHOLHDL, LDLDIRECT in the last 72 hours. Thyroid function studies No results for input(s): TSH, T4TOTAL, T3FREE, THYROIDAB in the last 72 hours.  Invalid input(s): FREET3 Anemia work up No results for input(s): VITAMINB12, FOLATE, FERRITIN, TIBC, IRON, RETICCTPCT in the last 72 hours. Urinalysis    Component Value Date/Time   COLORURINE YELLOW 01/02/2021 1014   APPEARANCEUR CLEAR 01/02/2021 1014   LABSPEC 1.018 01/02/2021 1014   PHURINE 5.0 01/02/2021 1014   GLUCOSEU 50 (A) 01/02/2021 1014   HGBUR NEGATIVE 01/02/2021 1014   BILIRUBINUR NEGATIVE 01/02/2021 1014   KETONESUR 20 (A) 01/02/2021 1014   PROTEINUR NEGATIVE 01/02/2021 1014   NITRITE NEGATIVE 01/02/2021 1014   LEUKOCYTESUR LARGE (A) 01/02/2021 1014   Sepsis Labs Invalid input(s): PROCALCITONIN,  WBC,  LACTICIDVEN Microbiology Recent Results (from the past 240 hour(s))  Culture, blood (routine x 2)     Status: None   Collection Time: 01/03/21  3:32 PM   Specimen: BLOOD  Result Value Ref Range Status   Specimen Description   Final    BLOOD RIGHT ANTECUBITAL Performed at [facility], [place] [address], [place], [place] [address]    Special Requests   Final    BOTTLES DRAWN AEROBIC ONLY Blood Culture adequate volume Performed at [facility] [address], [place], [place] [address]    Culture   Final    NO GROWTH 5 DAYS Performed at [facility], [place] [address], [place], [place] [address]    Report Status 01/08/2021 FINAL  Final  Culture, blood (routine x 2)     Status: None   Collection Time: 01/03/21  3:32 PM   Specimen: BLOOD LEFT HAND  Result Value Ref Range Status   Specimen Description   Final    BLOOD LEFT HAND Performed at [facility] [address] [place], [place] [address]    Special Requests   Final    BOTTLES DRAWN AEROBIC ONLY Blood Culture adequate volume Performed at [facility] [address],  [place], [place] [address]    Culture   Final    NO GROWTH 5 DAYS Performed at [facility], [place] [address] [place], [place] [address]    Report Status 01/08/2021 FINAL  Final  Surgical pcr screen     Status: Abnormal   Collection Time: 01/07/21  7:52 AM   Specimen: Nasal Mucosa; Nasal Swab  Result Value Ref Range Status   MRSA, PCR POSITIVE (A) NEGATIVE Final    Comment: RESULT CALLED TO, READ BACK BY AND VERIFIED WITH: JT AT 1013 ON 01/07/2021 BY MOSLEY,J    Staphylococcus aureus POSITIVE (A) NEGATIVE Final    Comment: (NOTE) The Xpert SA Assay (FDA approved for NASAL specimens in patients 25 years of age and older), is one component of a comprehensive surveillance program. It is not intended to diagnose infection nor to guide or monitor treatment. Performed at [facility], [place] [address], [place], [place] [address]      Time coordinating discharge: 38 minutes  SIGNED:   Georgette Shell, MD  [facility] 01/13/2021, 12:17 PM

## 2021-01-16 ENCOUNTER — Ambulatory Visit (INDEPENDENT_AMBULATORY_CARE_PROVIDER_SITE_OTHER): Payer: Medicare PPO

## 2021-01-16 DIAGNOSIS — I442 Atrioventricular block, complete: Secondary | ICD-10-CM | POA: Diagnosis not present

## 2021-01-16 LAB — CUP PACEART REMOTE DEVICE CHECK
Battery Remaining Longevity: 76 mo
Battery Voltage: 2.98 V
Brady Statistic AP VP Percent: 19.92 %
Brady Statistic AP VS Percent: 0.06 %
Brady Statistic AS VP Percent: 77.39 %
Brady Statistic AS VS Percent: 2.63 %
Brady Statistic RA Percent Paced: 20.81 %
Brady Statistic RV Percent Paced: 97.29 %
Date Time Interrogation Session: 20220426091151
Implantable Lead Implant Date: 20191024
Implantable Lead Implant Date: 20191024
Implantable Lead Location: 753859
Implantable Lead Location: 753860
Implantable Lead Model: 3830
Implantable Lead Model: 5076
Implantable Pulse Generator Implant Date: 20191024
Lead Channel Impedance Value: 285 Ohm
Lead Channel Impedance Value: 323 Ohm
Lead Channel Impedance Value: 418 Ohm
Lead Channel Impedance Value: 456 Ohm
Lead Channel Pacing Threshold Amplitude: 0.75 V
Lead Channel Pacing Threshold Amplitude: 1.75 V
Lead Channel Pacing Threshold Pulse Width: 0.4 ms
Lead Channel Pacing Threshold Pulse Width: 0.4 ms
Lead Channel Sensing Intrinsic Amplitude: 2.75 mV
Lead Channel Sensing Intrinsic Amplitude: 2.75 mV
Lead Channel Sensing Intrinsic Amplitude: 8.25 mV
Lead Channel Sensing Intrinsic Amplitude: 8.25 mV
Lead Channel Setting Pacing Amplitude: 2 V
Lead Channel Setting Pacing Amplitude: 3.5 V
Lead Channel Setting Pacing Pulse Width: 0.4 ms
Lead Channel Setting Sensing Sensitivity: 1.2 mV

## 2021-01-18 DIAGNOSIS — E440 Moderate protein-calorie malnutrition: Secondary | ICD-10-CM | POA: Diagnosis not present

## 2021-01-18 DIAGNOSIS — K56609 Unspecified intestinal obstruction, unspecified as to partial versus complete obstruction: Secondary | ICD-10-CM | POA: Diagnosis not present

## 2021-01-18 DIAGNOSIS — E119 Type 2 diabetes mellitus without complications: Secondary | ICD-10-CM | POA: Diagnosis not present

## 2021-02-05 ENCOUNTER — Telehealth: Payer: Self-pay

## 2021-02-05 NOTE — Telephone Encounter (Signed)
Spoke with patient's son Raquel Sarna and scheduled an in-person Palliative Consult for 02/28/21 @ 12:30PM  COVID screening was negative. No pets in home. Patient lives with her husband, but will be using her son Shon Millet address for appointments.   Consent obtained; updated Outlook/Netsmart/Team List and Epic.  Family is aware they may be receiving a call from NP the day before or day of to confirm appointment.

## 2021-02-06 NOTE — Progress Notes (Signed)
Remote pacemaker transmission.   

## 2021-02-14 DIAGNOSIS — Z950 Presence of cardiac pacemaker: Secondary | ICD-10-CM | POA: Diagnosis not present

## 2021-02-14 DIAGNOSIS — E559 Vitamin D deficiency, unspecified: Secondary | ICD-10-CM | POA: Diagnosis not present

## 2021-02-14 DIAGNOSIS — Z853 Personal history of malignant neoplasm of breast: Secondary | ICD-10-CM | POA: Diagnosis not present

## 2021-02-14 DIAGNOSIS — Z008 Encounter for other general examination: Secondary | ICD-10-CM | POA: Diagnosis not present

## 2021-02-14 DIAGNOSIS — Z0000 Encounter for general adult medical examination without abnormal findings: Secondary | ICD-10-CM | POA: Diagnosis not present

## 2021-02-14 DIAGNOSIS — Z8544 Personal history of malignant neoplasm of other female genital organs: Secondary | ICD-10-CM | POA: Diagnosis not present

## 2021-02-14 DIAGNOSIS — I442 Atrioventricular block, complete: Secondary | ICD-10-CM | POA: Diagnosis not present

## 2021-02-21 DIAGNOSIS — D509 Iron deficiency anemia, unspecified: Secondary | ICD-10-CM | POA: Diagnosis not present

## 2021-02-21 DIAGNOSIS — R1030 Lower abdominal pain, unspecified: Secondary | ICD-10-CM | POA: Diagnosis not present

## 2021-02-21 DIAGNOSIS — R197 Diarrhea, unspecified: Secondary | ICD-10-CM | POA: Diagnosis not present

## 2021-02-26 ENCOUNTER — Other Ambulatory Visit: Payer: Self-pay | Admitting: Gastroenterology

## 2021-02-26 DIAGNOSIS — R1030 Lower abdominal pain, unspecified: Secondary | ICD-10-CM

## 2021-02-28 ENCOUNTER — Other Ambulatory Visit: Payer: Medicare PPO | Admitting: Student

## 2021-02-28 ENCOUNTER — Encounter: Payer: Self-pay | Admitting: Hematology and Oncology

## 2021-02-28 ENCOUNTER — Other Ambulatory Visit: Payer: Self-pay

## 2021-02-28 DIAGNOSIS — Z515 Encounter for palliative care: Secondary | ICD-10-CM | POA: Diagnosis not present

## 2021-02-28 DIAGNOSIS — D539 Nutritional anemia, unspecified: Secondary | ICD-10-CM

## 2021-02-28 DIAGNOSIS — R4586 Emotional lability: Secondary | ICD-10-CM | POA: Diagnosis not present

## 2021-02-28 DIAGNOSIS — E440 Moderate protein-calorie malnutrition: Secondary | ICD-10-CM

## 2021-02-28 DIAGNOSIS — R413 Other amnesia: Secondary | ICD-10-CM | POA: Diagnosis not present

## 2021-02-28 NOTE — Progress Notes (Signed)
Therapist, nutritional Palliative Care Consult Note Telephone: [phone]  Fax: [phone]    Date of encounter: 02/28/21 PATIENT NAME: [name] [address] [place] [address]   [phone] (home) [phone] (work) DOB: 05-Nov-1945 MRN: [id number] PRIMARY CARE PROVIDER:    Shon Hale, MD,  [address] [place] [address] [phone]  REFERRING PROVIDER:   Shon Hale, MD [address] [place],  [place] [address] [phone]  RESPONSIBLE PARTY:    Contact Information    Name Relation Home Work Mobile   Harvey,Sabrina Spouse [phone]  [phone]   [name] [phone]  [phone]   [name] Daughter [phone]  [phone]       I met face to face with patient and family in the home. Palliative Care was asked to follow this patient by consultation request of  [name], * to address advance care planning and complex medical decision making. This is the initial visit.                                     ASSESSMENT AND PLAN / RECOMMENDATIONS:   Advance Care Planning/Goals of Care: Goals include to maximize quality of life and symptom management. Patient would like to "get better." Our advance care planning conversation included a discussion about:     The value and importance of advance care planning   Experiences with loved ones who have been seriously ill or have died   Exploration of personal, cultural or spiritual beliefs that might influence medical decisions   Exploration of goals of care in the event of a sudden injury or illness   Identification and preparation of a healthcare agent   MOST form completed  CODE STATUS: Full Code  Symptom Management/Plan:  Moderate Protein calorie malnutrition-patient with poor appetite. Albumin 2.4 on 01/11/21. Restart mirtazapine 15 mg QHS. Recommend nutritional supplement such as boost/ensure BID, eat  foods she enjoys.   Dementia-patient with forgetfulness, requires cueing/redirection. Family to continue provide cueing/redirection as needed. Restart donepezil as directed.   Medication adherence-patient having difficulty remembering to take her medications. Uncertain of when she last took certain medications. Recommend HH SN to help with medication management. Discussed techniques such as alarms, calendar along with pill box.   Anemia-patient reports melena; she was given hemoccult cards by PCP. Hemoglobin 9.0 last week per son. Family to drop off once collected. She is open to blood transfusions if needed. Continue Vitamin B12 as directed.   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 She is requesting her port-a-cath be removed. She is to follow up with Dr. Clifton Harvey at [facility] 3 months from last visit. Son is to make follow up appointment.  Mood-patient with depressed mood, tearful at times during visit. Expresses frustration with son, forgetfulness. Discussed taking prescribed mirtazapine. She is encouraged to spend time outside, get sunshine.   Will request most recent medications list, records from PCP.     Follow up Palliative Care Visit: Palliative care will continue to follow for complex medical decision making, advance care planning, and clarification of goals. Return in 8 weeks or prn.  I spent 75 minutes providing this consultation. More than 50% of the time in this consultation was spent in counseling and care coordination.    PPS: 60%  HOSPICE ELIGIBILITY/DIAGNOSIS:  TBD  Chief Complaint: Palliative Medicine initial consult.   HISTORY OF PRESENT ILLNESS:  [name] is a 75 y.o. year old female  with dementia, malnutrition, CKD 3, T2DM, CHB s/p pacemaker placement, vulva cancer, hx of breast cancer. Hospitalized 4/12-4/23/22 due to SBO. Vulva cancer Stage  IB originally dx in 2017. S/p local excision followed by radical resection. Recurrent disease July 2021; completion of radiation with chemosensitization 06/15/20-07/20/20. No overt evidence of active disease on f/u visit 12/21/20.  Patient resides at home with her husband; son and daughter are involved. She sometimes stays at son's home, which is around the corner from her. Patient denies pain,abdominal pain, chest pain. She denies constipation; she states her stools have been black. Denies hematochezia. She was given hemoccult cards by PCP last week; she has forgotten about providing sample. She endorses a poor to fair appetite. Patient has not been taking her mirtazapine. Son reports a significant weight loss in the past 1.5 years. Son has been fixing pill box for patient, but she does not take medications as directed. Does not check her blood sugar, last A1C 6.3 01/04/21. Patient able to complete adl's, no agencies in the home. No recent falls or injury.  History obtained from review of EMR, discussion with primary team, and interview with family, facility staff/caregiver and/or [name].  I reviewed available labs, medications, imaging, studies and related documents from the EMR.  Records reviewed and summarized above.   ROS  General: NAD EYES: denies vision changes ENMT: dysphagia, ill dentition Cardiovascular: denies chest pain Pulmonary: denies cough, denies increased SOB Abdomen: endorses poor appetite, denies constipation, endorses continence of bowel GU: denies dysuria MSK:  denies weakness,  no falls reported Skin: denies rashes or wounds Neurological: denies pain, denies insomnia Psych: Endorses depressed mood Heme/lymph/immuno: denies bruises, abnormal bleeding  Physical Exam: Weight: 117 pounds Constitutional: NAD General: frail appearing, thin  EYES: anicteric sclera, lids intact, no discharge  ENMT: intact hearing, oral mucous membranes moist CV: S1S2, RRR, no LE  edema Pulmonary: LCTA, no increased work of breathing, no cough, room air Abdomen: normo-active BS + 4 quadrants, soft and non tender, healed surgical incision GU: deferred MSK: sarcopenia, moves all extremities, ambulatory Skin: warm and dry, no rashes or wounds on visible skin Neuro:  no generalized weakness, A & O x 2, forgetful Psych: non-anxious affect, tearful at times Hem/lymph/immuno: no widespread bruising   CURRENT PROBLEM LIST:  Patient Active Problem List   Diagnosis Date Noted  . Abdominal pain   . Urinary tract infection without hematuria   . Malnutrition of moderate degree 01/08/2021  . Palliative care by specialist   . SBO (small bowel obstruction) ([place]) 01/04/2021  . Nausea and vomiting 01/02/2021  . Age-related cognitive decline 12/14/2020  . 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 Family history of prostate cancer   . Pancytopenia, acquired ([place]) 07/27/2020  . Thrombocytopenia ([place]) 07/13/2020  . Other constipation 07/06/2020  . Anemia due to antineoplastic chemotherapy 06/22/2020  . Surgical wound, non healing 06/15/2020  . Cancer associated pain 06/15/2020  . Goals of care, counseling/discussion 06/07/2020  . Weight loss, abnormal 06/06/2020  . Complete heart block ([place]) 05/15/2020  . Pacemaker 05/15/2020  . Memory loss 09/03/2018  . Bradycardia 07/15/2018  . Vulval lesion 02/01/2016  . Vulvar cancer ([place]) 01/10/2016  . 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  DIABETES, TYPE 2 11/28/2008  . COUGH 11/28/2008  . NEOPLASM, MALIGNANT, BREAST, HX OF 11/28/2008   PAST MEDICAL HISTORY:  Active Ambulatory Problems    Diagnosis Date Noted  . DIABETES, TYPE 2 11/28/2008  . COUGH 11/28/2008  . NEOPLASM, MALIGNANT, BREAST, HX OF 11/28/2008  . Vulvar cancer ([place]) 01/10/2016  . Vulval lesion 02/01/2016  . Combined form of senile  cataract of both eyes 08/01/2014  . 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 Bradycardia 07/15/2018  . Memory loss 09/03/2018  . Complete heart block ([place]) 05/15/2020  . Pacemaker 05/15/2020  . Weight loss, abnormal 06/06/2020  . Goals of care, counseling/discussion 06/07/2020  . Surgical wound, non healing 06/15/2020  . Cancer associated pain 06/15/2020  . Anemia due to antineoplastic chemotherapy 06/22/2020  . Other constipation 07/06/2020  . Thrombocytopenia ([place]) 07/13/2020  . Pancytopenia, acquired ([place]) 07/27/2020  . Family history of prostate cancer   . 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 Age-related cognitive decline 12/14/2020  . Nausea and vomiting 01/02/2021  . SBO (small bowel obstruction) ([place]) 01/04/2021  . Palliative care by specialist   . Malnutrition of moderate degree 01/08/2021  . Abdominal pain   . Urinary tract infection without hematuria    Resolved Ambulatory Problems    Diagnosis Date Noted  . No Resolved Ambulatory Problems   Past Medical History:  Diagnosis Date  . Breast cancer ([place])   . Cancer ([place]) left   . Diabetes mellitus   . Headache   . Hypercholesteremia   . Macular degeneration   . Osteoarthritis   . 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 Positive PPD   . Vitamin D deficiency    SOCIAL HX:  Social History   Tobacco Use  . Smoking status: Never Smoker  . Smokeless tobacco: Never Used  Substance Use Topics  . Alcohol use: Never   FAMILY HX:  Family History  Problem Relation Age of Onset  . Dementia Mother   . Other Father        possible Dementia  . Prostate cancer Father   . HIV Brother   . Kidney failure Brother       ALLERGIES:  Allergies  Allergen Reactions  . Duloxetine Itching    Cymbalta     PERTINENT MEDICATIONS:  Outpatient Encounter Medications as of 02/28/2021  Medication Sig  . Cyanocobalamin (VITAMIN B 12 PO) Take 1,000 mcg  by mouth daily.  Marland Kitchen donepezil (ARICEPT) 10 MG tablet Take 10 mg by mouth at bedtime.  . ergocalciferol (VITAMIN D2) 50000 UNITS capsule Take 50,000 Units by mouth every Tuesday.  . flecainide (TAMBOCOR) 50 MG tablet Take 1 tablet (50 mg total) by mouth 2 (two) times daily.  . metoprolol tartrate (LOPRESSOR) 25 MG tablet Take 1 tablet (25 mg total) by mouth 2 (two) times daily.  . Omega-3 Fatty Acids (FISH OIL) 1200 MG CAPS Take 2,400 mg by mouth every morning.  . ondansetron (ZOFRAN) 4 MG tablet Take 1 tablet (4 mg total) by mouth every 6 (six) hours as needed for nausea.  . sertraline (ZOLOFT) 100 MG tablet Take 1 tablet (100 mg total) by mouth daily.  . traMADol (ULTRAM) 50 MG tablet Take 1 tablet (50 mg total) by mouth every 6 (six) hours as needed for severe pain.   No facility-administered encounter medications on file as of 02/28/2021.    Thank you for the opportunity to participate in the care of Ms.  Parkison.  The palliative care team will continue to follow. Please call our office at [phone] if we can be of additional assistance.   Ezekiel Slocumb, NP   COVID-19 PATIENT SCREENING TOOL Asked and negative response unless otherwise noted:   Have you had symptoms of covid, tested positive or been in contact with someone with symptoms/positive test in the past 5-10 days? No

## 2021-03-14 ENCOUNTER — Encounter: Payer: Medicare PPO | Attending: Psychology | Admitting: Psychology

## 2021-03-15 ENCOUNTER — Ambulatory Visit
Admission: RE | Admit: 2021-03-15 | Discharge: 2021-03-15 | Disposition: A | Discharge status: Home / Self Care | Payer: Medicare PPO | Source: Ambulatory Visit | Attending: Gastroenterology | Admitting: Gastroenterology

## 2021-03-15 ENCOUNTER — Other Ambulatory Visit: Payer: Self-pay

## 2021-03-15 DIAGNOSIS — K5900 Constipation, unspecified: Secondary | ICD-10-CM | POA: Diagnosis not present

## 2021-03-15 DIAGNOSIS — R1030 Lower abdominal pain, unspecified: Secondary | ICD-10-CM | POA: Diagnosis not present

## 2021-03-15 MED ORDER — IOPAMIDOL (ISOVUE-300) INJECTION 61%
100.0000 mL | Freq: Once | INTRAVENOUS | Status: AC | PRN
  Administered 2021-03-15: 100 mL via INTRAVENOUS

## 2021-03-20 ENCOUNTER — Other Ambulatory Visit: Payer: Self-pay

## 2021-03-20 DIAGNOSIS — E7800 Pure hypercholesterolemia, unspecified: Secondary | ICD-10-CM | POA: Diagnosis not present

## 2021-03-20 DIAGNOSIS — E440 Moderate protein-calorie malnutrition: Secondary | ICD-10-CM | POA: Diagnosis not present

## 2021-03-20 DIAGNOSIS — F0150 Vascular dementia without behavioral disturbance: Secondary | ICD-10-CM | POA: Diagnosis not present

## 2021-03-20 DIAGNOSIS — D649 Anemia, unspecified: Secondary | ICD-10-CM | POA: Diagnosis not present

## 2021-03-20 DIAGNOSIS — I10 Essential (primary) hypertension: Secondary | ICD-10-CM | POA: Diagnosis not present

## 2021-03-22 DIAGNOSIS — M170 Bilateral primary osteoarthritis of knee: Secondary | ICD-10-CM | POA: Diagnosis not present

## 2021-03-22 DIAGNOSIS — C519 Malignant neoplasm of vulva, unspecified: Secondary | ICD-10-CM | POA: Diagnosis not present

## 2021-03-23 ENCOUNTER — Encounter: Payer: Self-pay | Admitting: Hematology and Oncology

## 2021-04-04 DIAGNOSIS — F0150 Vascular dementia without behavioral disturbance: Secondary | ICD-10-CM | POA: Diagnosis not present

## 2021-04-04 DIAGNOSIS — Z86010 Personal history of colonic polyps: Secondary | ICD-10-CM | POA: Diagnosis not present

## 2021-04-04 DIAGNOSIS — Z8619 Personal history of other infectious and parasitic diseases: Secondary | ICD-10-CM | POA: Diagnosis not present

## 2021-04-04 DIAGNOSIS — Z8719 Personal history of other diseases of the digestive system: Secondary | ICD-10-CM | POA: Diagnosis not present

## 2021-04-04 DIAGNOSIS — D649 Anemia, unspecified: Secondary | ICD-10-CM | POA: Diagnosis not present

## 2021-04-04 DIAGNOSIS — K639 Disease of intestine, unspecified: Secondary | ICD-10-CM | POA: Diagnosis not present

## 2021-04-06 DIAGNOSIS — C519 Malignant neoplasm of vulva, unspecified: Secondary | ICD-10-CM | POA: Diagnosis not present

## 2021-04-06 DIAGNOSIS — Z452 Encounter for adjustment and management of vascular access device: Secondary | ICD-10-CM | POA: Diagnosis not present

## 2021-04-17 ENCOUNTER — Ambulatory Visit (INDEPENDENT_AMBULATORY_CARE_PROVIDER_SITE_OTHER): Payer: Medicare PPO

## 2021-04-17 DIAGNOSIS — I442 Atrioventricular block, complete: Secondary | ICD-10-CM

## 2021-04-17 LAB — CUP PACEART REMOTE DEVICE CHECK
Battery Remaining Longevity: 77 mo
Battery Voltage: 2.97 V
Brady Statistic AP VP Percent: 3.32 %
Brady Statistic AP VS Percent: 0.01 %
Brady Statistic AS VP Percent: 96.55 %
Brady Statistic AS VS Percent: 0.12 %
Brady Statistic RA Percent Paced: 3.37 %
Brady Statistic RV Percent Paced: 99.87 %
Date Time Interrogation Session: 20220725223731
Implantable Lead Implant Date: 20191024
Implantable Lead Implant Date: 20191024
Implantable Lead Location: 753859
Implantable Lead Location: 753860
Implantable Lead Model: 3830
Implantable Lead Model: 5076
Implantable Pulse Generator Implant Date: 20191024
Lead Channel Impedance Value: 285 Ohm
Lead Channel Impedance Value: 304 Ohm
Lead Channel Impedance Value: 399 Ohm
Lead Channel Impedance Value: 418 Ohm
Lead Channel Pacing Threshold Amplitude: 0.625 V
Lead Channel Pacing Threshold Amplitude: 1.625 V
Lead Channel Pacing Threshold Pulse Width: 0.4 ms
Lead Channel Pacing Threshold Pulse Width: 0.4 ms
Lead Channel Sensing Intrinsic Amplitude: 1.875 mV
Lead Channel Sensing Intrinsic Amplitude: 1.875 mV
Lead Channel Sensing Intrinsic Amplitude: 5.375 mV
Lead Channel Sensing Intrinsic Amplitude: 5.375 mV
Lead Channel Setting Pacing Amplitude: 2 V
Lead Channel Setting Pacing Amplitude: 3.25 V
Lead Channel Setting Pacing Pulse Width: 0.4 ms
Lead Channel Setting Sensing Sensitivity: 1.2 mV

## 2021-04-26 DIAGNOSIS — R1030 Lower abdominal pain, unspecified: Secondary | ICD-10-CM | POA: Diagnosis not present

## 2021-04-26 DIAGNOSIS — R197 Diarrhea, unspecified: Secondary | ICD-10-CM | POA: Diagnosis not present

## 2021-05-14 NOTE — Progress Notes (Signed)
Remote pacemaker transmission.   

## 2021-07-12 DIAGNOSIS — M1712 Unilateral primary osteoarthritis, left knee: Secondary | ICD-10-CM | POA: Diagnosis not present

## 2021-07-12 DIAGNOSIS — M170 Bilateral primary osteoarthritis of knee: Secondary | ICD-10-CM | POA: Diagnosis not present

## 2021-07-12 DIAGNOSIS — M1711 Unilateral primary osteoarthritis, right knee: Secondary | ICD-10-CM | POA: Diagnosis not present

## 2021-07-17 ENCOUNTER — Ambulatory Visit (INDEPENDENT_AMBULATORY_CARE_PROVIDER_SITE_OTHER): Payer: Medicare PPO

## 2021-07-17 DIAGNOSIS — I442 Atrioventricular block, complete: Secondary | ICD-10-CM | POA: Diagnosis not present

## 2021-07-17 LAB — CUP PACEART REMOTE DEVICE CHECK
Battery Remaining Longevity: 73 mo
Battery Voltage: 2.97 V
Brady Statistic AP VP Percent: 4.61 %
Brady Statistic AP VS Percent: 0 %
Brady Statistic AS VP Percent: 95.36 %
Brady Statistic AS VS Percent: 0.03 %
Brady Statistic RA Percent Paced: 4.61 %
Brady Statistic RV Percent Paced: 99.97 %
Date Time Interrogation Session: 20221025082511
Implantable Lead Implant Date: 20191024
Implantable Lead Implant Date: 20191024
Implantable Lead Location: 753859
Implantable Lead Location: 753860
Implantable Lead Model: 3830
Implantable Lead Model: 5076
Implantable Pulse Generator Implant Date: 20191024
Lead Channel Impedance Value: 266 Ohm
Lead Channel Impedance Value: 304 Ohm
Lead Channel Impedance Value: 399 Ohm
Lead Channel Impedance Value: 418 Ohm
Lead Channel Pacing Threshold Amplitude: 0.625 V
Lead Channel Pacing Threshold Amplitude: 1.375 V
Lead Channel Pacing Threshold Pulse Width: 0.4 ms
Lead Channel Pacing Threshold Pulse Width: 0.4 ms
Lead Channel Sensing Intrinsic Amplitude: 2.5 mV
Lead Channel Sensing Intrinsic Amplitude: 2.5 mV
Lead Channel Sensing Intrinsic Amplitude: 5.375 mV
Lead Channel Sensing Intrinsic Amplitude: 5.375 mV
Lead Channel Setting Pacing Amplitude: 2 V
Lead Channel Setting Pacing Amplitude: 3 V
Lead Channel Setting Pacing Pulse Width: 0.4 ms
Lead Channel Setting Sensing Sensitivity: 1.2 mV

## 2021-07-26 NOTE — Progress Notes (Signed)
Remote pacemaker transmission.   

## 2021-08-06 DIAGNOSIS — Z03818 Encounter for observation for suspected exposure to other biological agents ruled out: Secondary | ICD-10-CM | POA: Diagnosis not present

## 2021-08-06 DIAGNOSIS — B349 Viral infection, unspecified: Secondary | ICD-10-CM | POA: Diagnosis not present

## 2021-08-06 DIAGNOSIS — R432 Parageusia: Secondary | ICD-10-CM | POA: Diagnosis not present

## 2021-08-06 DIAGNOSIS — J029 Acute pharyngitis, unspecified: Secondary | ICD-10-CM | POA: Diagnosis not present

## 2021-08-06 DIAGNOSIS — R059 Cough, unspecified: Secondary | ICD-10-CM | POA: Diagnosis not present

## 2021-08-21 DIAGNOSIS — D649 Anemia, unspecified: Secondary | ICD-10-CM | POA: Diagnosis not present

## 2021-08-21 DIAGNOSIS — E1169 Type 2 diabetes mellitus with other specified complication: Secondary | ICD-10-CM | POA: Diagnosis not present

## 2021-08-21 DIAGNOSIS — R5383 Other fatigue: Secondary | ICD-10-CM | POA: Diagnosis not present

## 2021-10-16 ENCOUNTER — Ambulatory Visit (INDEPENDENT_AMBULATORY_CARE_PROVIDER_SITE_OTHER): Payer: Medicare PPO

## 2021-10-16 DIAGNOSIS — I442 Atrioventricular block, complete: Secondary | ICD-10-CM | POA: Diagnosis not present

## 2021-10-16 LAB — CUP PACEART REMOTE DEVICE CHECK
Battery Remaining Longevity: 60 mo
Battery Voltage: 2.96 V
Brady Statistic AP VP Percent: 7.59 %
Brady Statistic AP VS Percent: 0 %
Brady Statistic AS VP Percent: 92.36 %
Brady Statistic AS VS Percent: 0.04 %
Brady Statistic RA Percent Paced: 7.6 %
Brady Statistic RV Percent Paced: 99.96 %
Date Time Interrogation Session: 20230123195401
Implantable Lead Implant Date: 20191024
Implantable Lead Implant Date: 20191024
Implantable Lead Location: 753859
Implantable Lead Location: 753860
Implantable Lead Model: 3830
Implantable Lead Model: 5076
Implantable Pulse Generator Implant Date: 20191024
Lead Channel Impedance Value: 285 Ohm
Lead Channel Impedance Value: 304 Ohm
Lead Channel Impedance Value: 380 Ohm
Lead Channel Impedance Value: 399 Ohm
Lead Channel Pacing Threshold Amplitude: 0.5 V
Lead Channel Pacing Threshold Amplitude: 1.75 V
Lead Channel Pacing Threshold Pulse Width: 0.4 ms
Lead Channel Pacing Threshold Pulse Width: 0.4 ms
Lead Channel Sensing Intrinsic Amplitude: 3.625 mV
Lead Channel Sensing Intrinsic Amplitude: 3.625 mV
Lead Channel Sensing Intrinsic Amplitude: 9.125 mV
Lead Channel Sensing Intrinsic Amplitude: 9.125 mV
Lead Channel Setting Pacing Amplitude: 2 V
Lead Channel Setting Pacing Amplitude: 3.5 V
Lead Channel Setting Pacing Pulse Width: 0.4 ms
Lead Channel Setting Sensing Sensitivity: 1.2 mV

## 2021-10-26 NOTE — Progress Notes (Signed)
Remote pacemaker transmission.   

## 2021-11-06 ENCOUNTER — Telehealth: Payer: Self-pay

## 2021-11-06 DIAGNOSIS — Z515 Encounter for palliative care: Secondary | ICD-10-CM

## 2021-11-06 NOTE — Telephone Encounter (Signed)
(  1:58 pm) SW completed check-in call with patient . She reported that she was doing fine and did not need palliative care services.  Patient to be discharged.

## 2021-11-13 DIAGNOSIS — M170 Bilateral primary osteoarthritis of knee: Secondary | ICD-10-CM | POA: Diagnosis not present

## 2021-12-20 DIAGNOSIS — M19071 Primary osteoarthritis, right ankle and foot: Secondary | ICD-10-CM | POA: Diagnosis not present

## 2021-12-20 DIAGNOSIS — M19072 Primary osteoarthritis, left ankle and foot: Secondary | ICD-10-CM | POA: Diagnosis not present

## 2022-02-21 ENCOUNTER — Other Ambulatory Visit (HOSPITAL_COMMUNITY): Payer: Self-pay

## 2022-02-21 MED ORDER — TRAMADOL HCL 50 MG PO TABS
50.0000 mg | ORAL_TABLET | ORAL | 0 refills | Status: DC
  Filled 2022-02-21: qty 12, 2d supply, fill #0

## 2022-04-29 ENCOUNTER — Encounter: Payer: Self-pay | Admitting: Podiatry

## 2022-04-29 ENCOUNTER — Ambulatory Visit: Payer: Medicare PPO | Admitting: Podiatry

## 2022-04-29 DIAGNOSIS — D696 Thrombocytopenia, unspecified: Secondary | ICD-10-CM

## 2022-04-29 DIAGNOSIS — M79675 Pain in left toe(s): Secondary | ICD-10-CM

## 2022-04-29 DIAGNOSIS — B351 Tinea unguium: Secondary | ICD-10-CM | POA: Diagnosis not present

## 2022-04-29 DIAGNOSIS — M79674 Pain in right toe(s): Secondary | ICD-10-CM

## 2022-04-29 NOTE — Progress Notes (Signed)
This patient returns to my office for at risk foot care.  This patient requires this care by a professional since this patient will be at risk due to having thrombocytopenia and diabetes.This patient is unable to cut nails himself since the patient cannot reach his nails.These nails are painful walking and wearing shoes.  This patient presents for at risk foot care today.  General Appearance  Alert, conversant and in no acute stress.  Vascular  Dorsalis pedis and posterior tibial  pulses are palpable  bilaterally.  Capillary return is within normal limits  bilaterally. Temperature is within normal limits  bilaterally.  Neurologic  Senn-Weinstein monofilament wire test within normal limits  bilaterally. Muscle power within normal limits bilaterally.  Nails Thick disfigured discolored nails with subungual debris  from second to fifth toes bilaterally. No evidence of bacterial infection or drainage bilaterally.  Orthopedic  No limitations of motion  feet .  No crepitus or effusions noted.  No bony pathology or digital deformities noted.  Skin  normotropic skin with no porokeratosis noted bilaterally.  No signs of infections or ulcers noted.     Onychomycosis  Pain in right toes  Pain in left toes  Consent was obtained for treatment procedures.   Mechanical debridement of nails 1-5  bilaterally performed with a nail nipper.  Filed with dremel without incident.    Return office visit   3 months                   Told patient to return for periodic foot care and evaluation due to potential at risk complications.   Kathryn Cosby DPM   

## 2022-05-06 ENCOUNTER — Other Ambulatory Visit: Payer: Self-pay | Admitting: *Deleted

## 2022-05-06 NOTE — Patient Outreach (Signed)
  Care Coordination   05/06/2022 Name: [name] MRN: [id number] DOB: 11-18-1945   Care Coordination Outreach Attempts:  An unsuccessful telephone outreach was attempted today to offer the patient information about available care coordination services as a benefit of their health plan.   Follow Up Plan:  Additional outreach attempts will be made to offer the patient care coordination information and services.   Encounter Outcome:  No Answer  Care Coordination Interventions Activated:  No   Care Coordination Interventions:  No, not indicated    Raina Mina, RN Care Management Coordinator [place] Office [phone]

## 2022-05-15 ENCOUNTER — Other Ambulatory Visit: Payer: Self-pay | Admitting: *Deleted

## 2022-05-15 ENCOUNTER — Encounter: Payer: Self-pay | Admitting: *Deleted

## 2022-05-15 NOTE — Patient Outreach (Addendum)
  Care Coordination   Initial Visit Note   05/15/2022 Name: [name] MRN: [id number] DOB: [birth year]  [name] is a 76 y.o. year old female who sees Lindell Noe, [name], MD for primary care. I  spoke with the son [name] due to pt's stage of Dementia  What matters to the patients health and wellness today?  No further follow up call needed    Goals Addressed               This Visit's Progress     No further follow up needed (pt-stated)        Care Coordination Interventions: Advised patient to spoke with the [facility] Son [name] and encouraged completion of the HCPOA part of the A.D packet that RN will send via mail out packet. Provided education to patient and/or caregiver about advanced directives Reviewed medications with patient and discussed the importance of taking her medications. Son states Dr. Lindell Noe if aware pt refuses to take any of her medications due her stage of Dementia. Note spouse lives with pt as the primary caregiver and son [name] coordinates pt medical care. Reviewed scheduled/upcoming provider appointments including Reviewed all pending appointments and strongly encouraged son to contact primary care provider and scheduled pt's AWV for this years (receptive to scheduling this appointment.  Screening for signs and symptoms of depression related to chronic disease state  Assessed social determinant of health barriers        SDOH assessments and interventions completed:  Yes  SDOH Interventions Today    Flowsheet Row Most Recent Value  SDOH Interventions   Food Insecurity Interventions Intervention Not Indicated  Housing Interventions Intervention Not Indicated  Transportation Interventions Intervention Not Indicated        Care Coordination Interventions Activated:  Yes  Care Coordination Interventions:  Yes, provided   Follow up plan: No further intervention required.   Encounter Outcome:  Pt. Visit Completed   Raina Mina,  RN Care Management Coordinator [place] Office [phone]

## 2022-05-15 NOTE — Patient Instructions (Signed)
Visit Information  Thank you for taking time to visit with me today. Please don't hesitate to contact me if I can be of assistance to you.   Following are the goals we discussed today:   Goals Addressed               This Visit's Progress     No further follow up needed (pt-stated)        Care Coordination Interventions: Advised patient to spoke with the [facility] Son [name] and encouraged completion of the HCPOA part of the A.D packet that RN will send via mail out packet. Provided education to patient and/or caregiver about advanced directives Reviewed medications with patient and discussed the importance of taking her medications. Son states Dr. Lindell Noe if aware pt refuses to take any of her medications due her stage of Dementia. Note spouse lives with pt as the primary caregiver and son [name] coordinates pt medical care. Reviewed scheduled/upcoming provider appointments including Reviewed all pending appointments and strongly encouraged son to contact primary care provider and scheduled pt's AWV for this years (receptive to scheduling this appointment.  Screening for signs and symptoms of depression related to chronic disease state  Assessed social determinant of health barriers        Please call the care guide team at [phone] if you need to cancel or reschedule your appointment.   If you are experiencing a Mental Health or [place] or need someone to talk to, please call the Suicide and Crisis Lifeline: 988  Patient verbalizes understanding of instructions and care plan provided today and agrees to view in [place]. Active MyChart status and patient understanding of how to access instructions and care plan via MyChart confirmed with patient.     No further follow up required: No further needs  Raina Mina, RN Care Management Coordinator [place] Office [phone]

## 2022-07-06 DIAGNOSIS — Z23 Encounter for immunization: Secondary | ICD-10-CM | POA: Diagnosis not present

## 2022-07-16 ENCOUNTER — Ambulatory Visit: Payer: Medicare PPO

## 2022-08-02 ENCOUNTER — Encounter: Payer: Self-pay | Admitting: Podiatry

## 2022-08-02 ENCOUNTER — Ambulatory Visit (INDEPENDENT_AMBULATORY_CARE_PROVIDER_SITE_OTHER): Payer: Medicare PPO | Admitting: Podiatry

## 2022-08-02 DIAGNOSIS — D696 Thrombocytopenia, unspecified: Secondary | ICD-10-CM | POA: Diagnosis not present

## 2022-08-02 DIAGNOSIS — M79674 Pain in right toe(s): Secondary | ICD-10-CM

## 2022-08-02 DIAGNOSIS — B351 Tinea unguium: Secondary | ICD-10-CM | POA: Diagnosis not present

## 2022-08-02 DIAGNOSIS — M79675 Pain in left toe(s): Secondary | ICD-10-CM

## 2022-08-02 NOTE — Progress Notes (Signed)
This patient returns to my office for at risk foot care.  This patient requires this care by a professional since this patient will be at risk due to having thrombocytopenia and diabetes.This patient is unable to cut nails himself since the patient cannot reach his nails.These nails are painful walking and wearing shoes.  This patient presents for at risk foot care today.  General Appearance  Alert, conversant and in no acute stress.  Vascular  Dorsalis pedis and posterior tibial  pulses are palpable  bilaterally.  Capillary return is within normal limits  bilaterally. Temperature is within normal limits  bilaterally.  Neurologic  Senn-Weinstein monofilament wire test within normal limits  bilaterally. Muscle power within normal limits bilaterally.  Nails Thick disfigured discolored nails with subungual debris  from second to fifth toes bilaterally. No evidence of bacterial infection or drainage bilaterally.  Orthopedic  No limitations of motion  feet .  No crepitus or effusions noted.  No bony pathology or digital deformities noted.  Skin  normotropic skin with no porokeratosis noted bilaterally.  No signs of infections or ulcers noted.     Onychomycosis  Pain in right toes  Pain in left toes  Consent was obtained for treatment procedures.   Mechanical debridement of nails 1-5  bilaterally performed with a nail nipper.  Filed with dremel without incident.    Return office visit   3 months                   Told patient to return for periodic foot care and evaluation due to potential at risk complications.   Gardiner Barefoot DPM

## 2022-08-13 DIAGNOSIS — M1712 Unilateral primary osteoarthritis, left knee: Secondary | ICD-10-CM | POA: Diagnosis not present

## 2022-08-13 DIAGNOSIS — M1711 Unilateral primary osteoarthritis, right knee: Secondary | ICD-10-CM | POA: Diagnosis not present

## 2022-08-13 DIAGNOSIS — M170 Bilateral primary osteoarthritis of knee: Secondary | ICD-10-CM | POA: Diagnosis not present

## 2022-08-29 DIAGNOSIS — R54 Age-related physical debility: Secondary | ICD-10-CM | POA: Diagnosis not present

## 2022-08-29 DIAGNOSIS — G308 Other Alzheimer's disease: Secondary | ICD-10-CM | POA: Diagnosis not present

## 2022-08-29 DIAGNOSIS — K062 Gingival and edentulous alveolar ridge lesions associated with trauma: Secondary | ICD-10-CM | POA: Diagnosis not present

## 2022-08-29 DIAGNOSIS — E119 Type 2 diabetes mellitus without complications: Secondary | ICD-10-CM | POA: Diagnosis not present

## 2022-08-29 DIAGNOSIS — E7800 Pure hypercholesterolemia, unspecified: Secondary | ICD-10-CM | POA: Diagnosis not present

## 2022-08-29 DIAGNOSIS — E440 Moderate protein-calorie malnutrition: Secondary | ICD-10-CM | POA: Diagnosis not present

## 2022-08-29 DIAGNOSIS — Z950 Presence of cardiac pacemaker: Secondary | ICD-10-CM | POA: Diagnosis not present

## 2022-08-29 DIAGNOSIS — F0150 Vascular dementia without behavioral disturbance: Secondary | ICD-10-CM | POA: Diagnosis not present

## 2022-08-29 DIAGNOSIS — Z23 Encounter for immunization: Secondary | ICD-10-CM | POA: Diagnosis not present

## 2022-10-15 ENCOUNTER — Ambulatory Visit: Payer: Medicare PPO | Attending: Internal Medicine

## 2022-10-15 DIAGNOSIS — I442 Atrioventricular block, complete: Secondary | ICD-10-CM

## 2022-10-21 LAB — CUP PACEART REMOTE DEVICE CHECK
Battery Remaining Longevity: 34 mo
Battery Voltage: 2.94 V
Brady Statistic AP VP Percent: 12.55 %
Brady Statistic AP VS Percent: 0 %
Brady Statistic AS VP Percent: 86.93 %
Brady Statistic AS VS Percent: 0.52 %
Brady Statistic RA Percent Paced: 12.76 %
Brady Statistic RV Percent Paced: 99.48 %
Date Time Interrogation Session: 20240127172214
Implantable Lead Connection Status: 753985
Implantable Lead Connection Status: 753985
Implantable Lead Implant Date: 20191024
Implantable Lead Implant Date: 20191024
Implantable Lead Location: 753859
Implantable Lead Location: 753860
Implantable Lead Model: 3830
Implantable Lead Model: 5076
Implantable Pulse Generator Implant Date: 20191024
Lead Channel Impedance Value: 285 Ohm
Lead Channel Impedance Value: 323 Ohm
Lead Channel Impedance Value: 380 Ohm
Lead Channel Impedance Value: 418 Ohm
Lead Channel Pacing Threshold Amplitude: 0.625 V
Lead Channel Pacing Threshold Amplitude: 1.5 V
Lead Channel Pacing Threshold Pulse Width: 0.4 ms
Lead Channel Pacing Threshold Pulse Width: 0.4 ms
Lead Channel Sensing Intrinsic Amplitude: 1.875 mV
Lead Channel Sensing Intrinsic Amplitude: 1.875 mV
Lead Channel Sensing Intrinsic Amplitude: 6.125 mV
Lead Channel Sensing Intrinsic Amplitude: 6.125 mV
Lead Channel Setting Pacing Amplitude: 2 V
Lead Channel Setting Pacing Amplitude: 3.25 V
Lead Channel Setting Pacing Pulse Width: 0.4 ms
Lead Channel Setting Sensing Sensitivity: 1.2 mV
Zone Setting Status: 755011
Zone Setting Status: 755011

## 2022-11-05 ENCOUNTER — Ambulatory Visit: Payer: Medicare PPO | Admitting: Podiatry

## 2022-11-12 NOTE — Progress Notes (Signed)
Remote pacemaker transmission.   

## 2022-11-13 DIAGNOSIS — R112 Nausea with vomiting, unspecified: Secondary | ICD-10-CM | POA: Diagnosis not present

## 2022-11-13 DIAGNOSIS — A084 Viral intestinal infection, unspecified: Secondary | ICD-10-CM | POA: Diagnosis not present

## 2022-11-15 ENCOUNTER — Ambulatory Visit: Payer: Medicare PPO | Admitting: Podiatry

## 2022-11-26 DIAGNOSIS — M1712 Unilateral primary osteoarthritis, left knee: Secondary | ICD-10-CM | POA: Diagnosis not present

## 2022-11-26 DIAGNOSIS — M1711 Unilateral primary osteoarthritis, right knee: Secondary | ICD-10-CM | POA: Diagnosis not present

## 2022-11-26 DIAGNOSIS — M170 Bilateral primary osteoarthritis of knee: Secondary | ICD-10-CM | POA: Diagnosis not present

## 2023-01-14 ENCOUNTER — Ambulatory Visit (INDEPENDENT_AMBULATORY_CARE_PROVIDER_SITE_OTHER): Payer: Medicare PPO

## 2023-01-14 DIAGNOSIS — I442 Atrioventricular block, complete: Secondary | ICD-10-CM | POA: Diagnosis not present

## 2023-01-20 LAB — CUP PACEART REMOTE DEVICE CHECK
Battery Remaining Longevity: 31 mo
Battery Voltage: 2.93 V
Brady Statistic AP VP Percent: 19.63 %
Brady Statistic AP VS Percent: 0.01 %
Brady Statistic AS VP Percent: 80.32 %
Brady Statistic AS VS Percent: 0.04 %
Brady Statistic RA Percent Paced: 19.57 %
Brady Statistic RV Percent Paced: 99.95 %
Date Time Interrogation Session: 20240426221811
Implantable Lead Connection Status: 753985
Implantable Lead Connection Status: 753985
Implantable Lead Implant Date: 20191024
Implantable Lead Implant Date: 20191024
Implantable Lead Location: 753859
Implantable Lead Location: 753860
Implantable Lead Model: 3830
Implantable Lead Model: 5076
Implantable Pulse Generator Implant Date: 20191024
Lead Channel Impedance Value: 266 Ohm
Lead Channel Impedance Value: 304 Ohm
Lead Channel Impedance Value: 342 Ohm
Lead Channel Impedance Value: 399 Ohm
Lead Channel Pacing Threshold Amplitude: 0.625 V
Lead Channel Pacing Threshold Amplitude: 1.625 V
Lead Channel Pacing Threshold Pulse Width: 0.4 ms
Lead Channel Pacing Threshold Pulse Width: 0.4 ms
Lead Channel Sensing Intrinsic Amplitude: 3 mV
Lead Channel Sensing Intrinsic Amplitude: 3 mV
Lead Channel Sensing Intrinsic Amplitude: 6 mV
Lead Channel Sensing Intrinsic Amplitude: 6 mV
Lead Channel Setting Pacing Amplitude: 2 V
Lead Channel Setting Pacing Amplitude: 3.5 V
Lead Channel Setting Pacing Pulse Width: 0.4 ms
Lead Channel Setting Sensing Sensitivity: 1.2 mV
Zone Setting Status: 755011
Zone Setting Status: 755011

## 2023-02-06 DIAGNOSIS — C519 Malignant neoplasm of vulva, unspecified: Secondary | ICD-10-CM | POA: Diagnosis not present

## 2023-02-06 DIAGNOSIS — N9089 Other specified noninflammatory disorders of vulva and perineum: Secondary | ICD-10-CM | POA: Diagnosis not present

## 2023-02-06 DIAGNOSIS — R102 Pelvic and perineal pain: Secondary | ICD-10-CM | POA: Diagnosis not present

## 2023-02-12 NOTE — Progress Notes (Signed)
Remote pacemaker transmission.   

## 2023-02-13 DIAGNOSIS — C519 Malignant neoplasm of vulva, unspecified: Secondary | ICD-10-CM | POA: Diagnosis not present

## 2023-02-13 DIAGNOSIS — R590 Localized enlarged lymph nodes: Secondary | ICD-10-CM | POA: Diagnosis not present

## 2023-02-20 DIAGNOSIS — R911 Solitary pulmonary nodule: Secondary | ICD-10-CM | POA: Diagnosis not present

## 2023-02-20 DIAGNOSIS — C519 Malignant neoplasm of vulva, unspecified: Secondary | ICD-10-CM | POA: Diagnosis not present

## 2023-03-04 DIAGNOSIS — C519 Malignant neoplasm of vulva, unspecified: Secondary | ICD-10-CM | POA: Diagnosis not present

## 2023-03-04 DIAGNOSIS — R590 Localized enlarged lymph nodes: Secondary | ICD-10-CM | POA: Diagnosis not present

## 2023-03-04 DIAGNOSIS — R911 Solitary pulmonary nodule: Secondary | ICD-10-CM | POA: Diagnosis not present

## 2023-03-11 DIAGNOSIS — M79672 Pain in left foot: Secondary | ICD-10-CM | POA: Diagnosis not present

## 2023-03-11 DIAGNOSIS — M79671 Pain in right foot: Secondary | ICD-10-CM | POA: Diagnosis not present

## 2023-03-20 ENCOUNTER — Ambulatory Visit: Payer: Medicare PPO | Attending: Internal Medicine | Admitting: Internal Medicine

## 2023-03-20 ENCOUNTER — Encounter: Payer: Self-pay | Admitting: Internal Medicine

## 2023-04-10 DIAGNOSIS — G5762 Lesion of plantar nerve, left lower limb: Secondary | ICD-10-CM | POA: Diagnosis not present

## 2023-04-10 DIAGNOSIS — M79672 Pain in left foot: Secondary | ICD-10-CM | POA: Diagnosis not present

## 2023-04-10 DIAGNOSIS — G5761 Lesion of plantar nerve, right lower limb: Secondary | ICD-10-CM | POA: Diagnosis not present

## 2023-04-10 DIAGNOSIS — M79671 Pain in right foot: Secondary | ICD-10-CM | POA: Diagnosis not present

## 2023-04-15 ENCOUNTER — Ambulatory Visit (INDEPENDENT_AMBULATORY_CARE_PROVIDER_SITE_OTHER): Payer: Medicare PPO

## 2023-04-15 DIAGNOSIS — I442 Atrioventricular block, complete: Secondary | ICD-10-CM | POA: Diagnosis not present

## 2023-04-16 LAB — CUP PACEART REMOTE DEVICE CHECK
Battery Remaining Longevity: 32 mo
Battery Voltage: 2.93 V
Brady Statistic AP VP Percent: 18.32 %
Brady Statistic AP VS Percent: 0 %
Brady Statistic AS VP Percent: 81.65 %
Brady Statistic AS VS Percent: 0.03 %
Brady Statistic RA Percent Paced: 18.26 %
Brady Statistic RV Percent Paced: 99.97 %
Date Time Interrogation Session: 20240723085028
Implantable Lead Connection Status: 753985
Implantable Lead Connection Status: 753985
Implantable Lead Implant Date: 20191024
Implantable Lead Implant Date: 20191024
Implantable Lead Location: 753859
Implantable Lead Location: 753860
Implantable Lead Model: 3830
Implantable Lead Model: 5076
Implantable Pulse Generator Implant Date: 20191024
Lead Channel Impedance Value: 285 Ohm
Lead Channel Impedance Value: 323 Ohm
Lead Channel Impedance Value: 380 Ohm
Lead Channel Impedance Value: 418 Ohm
Lead Channel Pacing Threshold Amplitude: 0.625 V
Lead Channel Pacing Threshold Amplitude: 1.5 V
Lead Channel Pacing Threshold Pulse Width: 0.4 ms
Lead Channel Pacing Threshold Pulse Width: 0.4 ms
Lead Channel Sensing Intrinsic Amplitude: 2 mV
Lead Channel Sensing Intrinsic Amplitude: 2 mV
Lead Channel Sensing Intrinsic Amplitude: 6 mV
Lead Channel Sensing Intrinsic Amplitude: 6 mV
Lead Channel Setting Pacing Amplitude: 2 V
Lead Channel Setting Pacing Amplitude: 3 V
Lead Channel Setting Pacing Pulse Width: 0.4 ms
Lead Channel Setting Sensing Sensitivity: 1.2 mV
Zone Setting Status: 755011
Zone Setting Status: 755011

## 2023-04-17 ENCOUNTER — Telehealth: Payer: Self-pay

## 2023-04-17 NOTE — Telephone Encounter (Signed)
Following alert received from [employer] received for 47 AHR totaling 0.1%, longest 19 minutes, EGM's consistent with AT and AF.  No history of AF in chart, no OAC.  Attempted to contact patient. No answer. LMTCB.

## 2023-04-18 NOTE — Telephone Encounter (Signed)
Patient is overdue for EP follow up will have scheduling reach out to son as that is who appears to schedule apts per [facility] notes.

## 2023-04-23 ENCOUNTER — Encounter: Payer: Self-pay | Admitting: Internal Medicine

## 2023-04-23 ENCOUNTER — Ambulatory Visit: Payer: Medicare PPO | Attending: Internal Medicine | Admitting: Internal Medicine

## 2023-04-23 VITALS — BP 140/84 | HR 93 | Ht 60.0 in | Wt 114.0 lb

## 2023-04-23 DIAGNOSIS — I442 Atrioventricular block, complete: Secondary | ICD-10-CM

## 2023-04-23 DIAGNOSIS — Z79899 Other long term (current) drug therapy: Secondary | ICD-10-CM

## 2023-04-23 DIAGNOSIS — R0989 Other specified symptoms and signs involving the circulatory and respiratory systems: Secondary | ICD-10-CM

## 2023-04-23 NOTE — Patient Instructions (Signed)
Medication Instructions:  Your physician recommends that you continue on your current medications as directed. Please refer to the Current Medication list given to you today.  *If you need a refill on your cardiac medications before your next appointment, please call your pharmacy*  Lab Work: TODAY: BMP If you have labs (blood work) drawn today and your tests are completely normal, you will receive your results only by: MyChart Message (if you have MyChart) OR A paper copy in the mail If you have any lab test that is abnormal or we need to change your treatment, we will call you to review the results.  Testing/Procedures: Your physician has recommended you have a brain MRI.  Follow-Up: At [facility], you and your health needs are our priority.  As part of our continuing mission to provide you with exceptional heart care, we have created designated Provider Care Teams.  These Care Teams include your primary Cardiologist (physician) and Advanced Practice Providers (APPs -  Physician Assistants and Nurse Practitioners) who all work together to provide you with the care you need, when you need it.  Your next appointment:   Based on results of MRI  The format for your next appointment:   In Person  Provider:   You may see Lewayne Bunting, MD or one of the following Advanced Practice Providers on your designated Care Team:   Francis Dowse, [place] [address] [place], [place] Sherie Don, NP Canary Brim, NP{

## 2023-04-23 NOTE — Progress Notes (Signed)
HPI [name] returns today for ongoing evaluation and management of her PPM. She is a pleasant 77 yo woman with a h/o breast CA, s/p treatment, DM, and CHB, s/p PPM insertion approximately 5 years ago. In the interim she has had problems with emotional lability and being quite tearful. She has chronic dyspnea. She has occaisional palpitations. No syncope. No other focal neuro findings. Allergies  Allergen Reactions   Duloxetine Itching    Cymbalta   Duloxetine Hcl     Other reaction(s): itching     Current Outpatient Medications  Medication Sig Dispense Refill   colestipol (COLESTID) 1 g tablet 2 tablets     Cyanocobalamin (VITAMIN B 12 PO) Take 1,000 mcg by mouth daily.     donepezil (ARICEPT) 10 MG tablet Take 10 mg by mouth at bedtime.     ergocalciferol (VITAMIN D2) 50000 UNITS capsule Take 50,000 Units by mouth every Tuesday.     Ferrous Sulfate (IRON) 325 (65 Fe) MG TABS 1 tablet     flecainide (TAMBOCOR) 50 MG tablet Take 1 tablet (50 mg total) by mouth 2 (two) times daily. 180 tablet 3   HYDROcodone-acetaminophen (NORCO/VICODIN) 5-325 MG tablet Take 1 tablet by mouth 3 (three) times daily as needed.     ipratropium (ATROVENT) 0.06 % nasal spray 2 sprays in each nostril     Iron-FA-B Cmp-C-Biot-Probiotic (FUSION PLUS) CAPS 1 capsule     metoprolol tartrate (LOPRESSOR) 25 MG tablet Take 1 tablet (25 mg total) by mouth 2 (two) times daily. 180 tablet 2   Omega-3 Fatty Acids (FISH OIL) 1200 MG CAPS Take 2,400 mg by mouth every morning.     ondansetron (ZOFRAN) 4 MG tablet Take 1 tablet (4 mg total) by mouth every 6 (six) hours as needed for nausea. 20 tablet 0   sertraline (ZOLOFT) 100 MG tablet Take 1 tablet (100 mg total) by mouth daily. 90 tablet 3   traMADol (ULTRAM) 50 MG tablet Take 1 tablet (50 mg total) by mouth every 6 (six) hours as needed for severe pain. 30 tablet 0   No current facility-administered medications for this visit.     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   Past Surgical History:  Procedure Laterality Date   ABDOMINAL HYSTERECTOMY     complete   BREAST SURGERY Left 2006   left breast lumpectomy , chemo and radiation done   COLONOSCOPY WITH PROPOFOL N/A 07/24/2015   Procedure: COLONOSCOPY WITH PROPOFOL;  Surgeon: Charolett Bumpers, MD;  Location: [facility];  Service: Endoscopy;  Laterality: N/A;   EYE SURGERY     removal of growth 35 years ago   HEMORROIDECTOMY     38 years ago, no present symtoms   IR IMAGING GUIDED PORT INSERTION  06/13/2020   KNEE SURGERY Bilateral    LAPAROTOMY N/A 01/07/2021  Procedure: EXPLORATORY LAPAROTOMY POSSIBLE SMALL BOWEL RESCTION;  Surgeon: Griselda Miner, MD;  Location: [facility];  Service: General;  Laterality: N/A;   PACEMAKER IMPLANT N/A 07/16/2018   Procedure: PACEMAKER IMPLANT;  Surgeon: Marinus Maw, MD;  Location: [facility] INVASIVE CV LAB;  Service: Cardiovascular;  Laterality: N/A;   RADICAL VULVECTOMY N/A 02/01/2016   Procedure: RADICAL VULVECTOMY;  Surgeon: Adolphus Birchwood, MD;  Location: [facility];  Service: Gynecology;  Laterality: N/A;     Family History  Problem Relation Age of Onset   Dementia Mother    Other Father        possible Dementia   Prostate cancer Father    HIV Brother    Kidney failure Brother      Social History   Socioeconomic History   Marital status: Married    Spouse name: Not on file   Number of children: 2   Years of education: Not on file   Highest education  level: Master's degree (e.g., MA, MS, MEng, MEd, MSW, MBA)  Occupational History   Not on file  Tobacco Use   Smoking status: Never   Smokeless tobacco: Never  Vaping Use   Vaping status: Never Used  Substance and Sexual Activity   Alcohol use: Never   Drug use: Never   Sexual activity: Yes  Other Topics Concern   Not on file  Social History Narrative   Lives at home with husband   Right handed   Caffeine: never   Social Determinants of Health   Financial Resource Strain: Low Risk  (05/16/2020)   Received from [facility], [facility]   Overall Financial Resource Strain (CARDIA)    Difficulty of Paying Living Expenses: Not very hard  Food Insecurity: No Food Insecurity (05/15/2022)   Hunger Vital Sign    Worried About Running Out of Food in the Last Year: Never true    Ran Out of Food in the Last Year: Never true  Transportation Needs: No Transportation Needs (05/15/2022)   PRAPARE - Administrator, Civil Service (Medical): No    Lack of Transportation (Non-Medical): No  Physical Activity: Not on file  Stress: Stress Concern Present (05/16/2020)   Received from [facility], [facility] Occupational [facility] - Occupational Stress Questionnaire    Feeling of Stress : To some extent  Social Connections: Unknown (02/05/2022)   Received from [facility], [facility]   Social Network    Social Network: Not on file  Intimate Partner Violence: Unknown (12/28/2021)   Received from [facility], [facility]   HITS    Physically Hurt: Not on file    Insult or Talk Down To: Not on file    Threaten Physical Harm: Not on file    Scream or Curse: Not on file     BP (!) 140/84   Pulse 93   Ht 5' (1.524 m)   Wt 114 lb (51.7 kg)   SpO2 97%   BMI 22.26 kg/m   Physical Exam:  Well appearing NAD HEENT: Unremarkable Neck:  No JVD, no thyromegally Lymphatics:  No adenopathy Back:  No CVA tenderness Lungs:  Clear with no  wheezes HEART:  Regular rate rhythm, no murmurs, no rubs, no clicks Abd:  soft, positive bowel sounds, no organomegally, no rebound, no guarding Ext:  2 plus pulses, no edema, no cyanosis, no clubbing Skin:  No rashes no nodules Neuro:  CN II through XII intact, motor grossly intact  DEVICE  Normal device function.  See PaceArt for details.   Assess/Plan: 1. CHB - she is asymptomatic, s/p PPM insertion. 2. HTN - her blood pressure is well controlled today. 3. 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 She paces in the ventricle 99% of the time. 4. Emotional lability/anxiety - I asked her to undergo MRI of the head to rule out an occult stroke. 5. Atrial fib - I would like her to start eliquis. We will check a bmp to provide dosing rec's.   Leonia Reeves.D.

## 2023-04-29 DIAGNOSIS — G5761 Lesion of plantar nerve, right lower limb: Secondary | ICD-10-CM | POA: Diagnosis not present

## 2023-04-29 DIAGNOSIS — M79671 Pain in right foot: Secondary | ICD-10-CM | POA: Diagnosis not present

## 2023-05-01 NOTE — Progress Notes (Signed)
Remote pacemaker transmission.   

## 2023-05-07 ENCOUNTER — Other Ambulatory Visit: Payer: Self-pay | Admitting: Obstetrics and Gynecology

## 2023-05-07 DIAGNOSIS — C519 Malignant neoplasm of vulva, unspecified: Secondary | ICD-10-CM

## 2023-06-05 DIAGNOSIS — M79672 Pain in left foot: Secondary | ICD-10-CM | POA: Diagnosis not present

## 2023-06-05 DIAGNOSIS — M79671 Pain in right foot: Secondary | ICD-10-CM | POA: Diagnosis not present

## 2023-06-09 ENCOUNTER — Ambulatory Visit
Admission: RE | Admit: 2023-06-09 | Discharge: 2023-06-09 | Disposition: A | Discharge status: Home / Self Care | Payer: Medicare PPO | Source: Ambulatory Visit | Attending: Obstetrics and Gynecology | Admitting: Obstetrics and Gynecology

## 2023-06-09 DIAGNOSIS — I700 Atherosclerosis of aorta: Secondary | ICD-10-CM | POA: Diagnosis not present

## 2023-06-09 DIAGNOSIS — C519 Malignant neoplasm of vulva, unspecified: Secondary | ICD-10-CM | POA: Diagnosis not present

## 2023-06-09 MED ORDER — IOPAMIDOL (ISOVUE-300) INJECTION 61%
80.0000 mL | Freq: Once | INTRAVENOUS | Status: AC | PRN
  Administered 2023-06-09: 80 mL via INTRAVENOUS

## 2023-06-11 ENCOUNTER — Ambulatory Visit: Payer: Medicare PPO | Admitting: Podiatry

## 2023-06-11 DIAGNOSIS — M79675 Pain in left toe(s): Secondary | ICD-10-CM | POA: Diagnosis not present

## 2023-06-11 DIAGNOSIS — E119 Type 2 diabetes mellitus without complications: Secondary | ICD-10-CM | POA: Diagnosis not present

## 2023-06-11 DIAGNOSIS — B351 Tinea unguium: Secondary | ICD-10-CM

## 2023-06-11 DIAGNOSIS — M79674 Pain in right toe(s): Secondary | ICD-10-CM

## 2023-06-11 NOTE — Progress Notes (Signed)
   Chief Complaint  Patient presents with   Foot Pain    SUBJECTIVE Patient with a history of diabetes mellitus presents to office today complaining of elongated, thickened nails that cause pain while ambulating in shoes.  Patient is unable to trim their own nails. Patient is here for further evaluation and treatment.  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 Derm Skin is dry and supple bilateral. Negative open lesions or macerations. Remaining integument unremarkable. Nails are tender, long, thickened and dystrophic with subungual debris, consistent with onychomycosis, 1-5 bilateral. No signs of infection noted. Vasc  DP and PT pedal pulses palpable bilaterally. Temperature gradient within normal limits.  Neuro Epicritic and protective threshold sensation diminished bilaterally.  Musculoskeletal Exam No symptomatic pedal deformities noted bilateral. Muscular strength within normal limits.  ASSESSMENT 1. Diabetes Mellitus w/ peripheral neuropathy 2.  Pain due to onychomycosis of toenails bilateral  PLAN OF CARE 1. Patient evaluated today.  Comprehensive diabetic foot exam performed today 2. Instructed to maintain good pedal hygiene and foot care. Stressed importance of controlling blood sugar.  3. Mechanical debridement of nails 1-5 bilaterally performed using a  nail nipper. Filed with dremel without incident.  4. Return to clinic in 3 mos.     Felecia Shelling, DPM [facility]  Dr. Felecia Shelling, DPM    [address] [place], [place] [address]                Office [phone]  Fax [phone]

## 2023-06-12 DIAGNOSIS — C519 Malignant neoplasm of vulva, unspecified: Secondary | ICD-10-CM | POA: Diagnosis not present

## 2023-06-27 DIAGNOSIS — E119 Type 2 diabetes mellitus without complications: Secondary | ICD-10-CM | POA: Diagnosis not present

## 2023-07-03 ENCOUNTER — Ambulatory Visit (HOSPITAL_COMMUNITY)
Admission: RE | Admit: 2023-07-03 | Discharge: 2023-07-03 | Disposition: A | Discharge status: Home / Self Care | Payer: Medicare PPO | Source: Ambulatory Visit | Attending: Internal Medicine | Admitting: Internal Medicine

## 2023-07-03 ENCOUNTER — Encounter (HOSPITAL_COMMUNITY): Payer: Self-pay

## 2023-07-03 DIAGNOSIS — R0989 Other specified symptoms and signs involving the circulatory and respiratory systems: Secondary | ICD-10-CM

## 2023-07-08 DIAGNOSIS — E43 Unspecified severe protein-calorie malnutrition: Secondary | ICD-10-CM | POA: Diagnosis not present

## 2023-07-08 DIAGNOSIS — F01C0 Vascular dementia, severe, without behavioral disturbance, psychotic disturbance, mood disturbance, and anxiety: Secondary | ICD-10-CM | POA: Diagnosis not present

## 2023-07-08 DIAGNOSIS — N1831 Chronic kidney disease, stage 3a: Secondary | ICD-10-CM | POA: Diagnosis not present

## 2023-07-08 DIAGNOSIS — R7301 Impaired fasting glucose: Secondary | ICD-10-CM | POA: Diagnosis not present

## 2023-07-08 DIAGNOSIS — Z0000 Encounter for general adult medical examination without abnormal findings: Secondary | ICD-10-CM | POA: Diagnosis not present

## 2023-07-08 DIAGNOSIS — E119 Type 2 diabetes mellitus without complications: Secondary | ICD-10-CM | POA: Diagnosis not present

## 2023-07-15 ENCOUNTER — Ambulatory Visit (INDEPENDENT_AMBULATORY_CARE_PROVIDER_SITE_OTHER): Payer: Medicare PPO

## 2023-07-15 DIAGNOSIS — I442 Atrioventricular block, complete: Secondary | ICD-10-CM | POA: Diagnosis not present

## 2023-07-16 LAB — CUP PACEART REMOTE DEVICE CHECK
Battery Remaining Longevity: 23 mo
Battery Voltage: 2.91 V
Brady Statistic AP VP Percent: 18.66 %
Brady Statistic AP VS Percent: 0 %
Brady Statistic AS VP Percent: 81.31 %
Brady Statistic AS VS Percent: 0.03 %
Brady Statistic RA Percent Paced: 18.63 %
Brady Statistic RV Percent Paced: 99.97 %
Date Time Interrogation Session: 20241023004955
Implantable Lead Connection Status: 753985
Implantable Lead Connection Status: 753985
Implantable Lead Implant Date: 20191024
Implantable Lead Implant Date: 20191024
Implantable Lead Location: 753859
Implantable Lead Location: 753860
Implantable Lead Model: 3830
Implantable Lead Model: 5076
Implantable Pulse Generator Implant Date: 20191024
Lead Channel Impedance Value: 266 Ohm
Lead Channel Impedance Value: 304 Ohm
Lead Channel Impedance Value: 380 Ohm
Lead Channel Impedance Value: 399 Ohm
Lead Channel Pacing Threshold Amplitude: 0.625 V
Lead Channel Pacing Threshold Amplitude: 2 V
Lead Channel Pacing Threshold Pulse Width: 0.4 ms
Lead Channel Pacing Threshold Pulse Width: 0.4 ms
Lead Channel Sensing Intrinsic Amplitude: 3 mV
Lead Channel Sensing Intrinsic Amplitude: 3 mV
Lead Channel Sensing Intrinsic Amplitude: 6 mV
Lead Channel Sensing Intrinsic Amplitude: 6 mV
Lead Channel Setting Pacing Amplitude: 2 V
Lead Channel Setting Pacing Amplitude: 4.25 V
Lead Channel Setting Pacing Pulse Width: 0.4 ms
Lead Channel Setting Sensing Sensitivity: 1.2 mV
Zone Setting Status: 755011
Zone Setting Status: 755011

## 2023-07-23 ENCOUNTER — Ambulatory Visit: Payer: Medicare PPO | Admitting: Podiatry

## 2023-07-28 ENCOUNTER — Encounter: Payer: Self-pay | Admitting: Podiatry

## 2023-07-28 ENCOUNTER — Ambulatory Visit: Payer: Medicare PPO | Admitting: Podiatry

## 2023-07-28 ENCOUNTER — Ambulatory Visit (INDEPENDENT_AMBULATORY_CARE_PROVIDER_SITE_OTHER): Payer: Medicare PPO

## 2023-07-28 DIAGNOSIS — E1142 Type 2 diabetes mellitus with diabetic polyneuropathy: Secondary | ICD-10-CM | POA: Diagnosis not present

## 2023-07-28 DIAGNOSIS — M779 Enthesopathy, unspecified: Secondary | ICD-10-CM

## 2023-07-28 DIAGNOSIS — L84 Corns and callosities: Secondary | ICD-10-CM | POA: Diagnosis not present

## 2023-07-28 NOTE — Progress Notes (Signed)
  Subjective:  Patient ID: [name], female    DOB: Apr 29, 1946,   MRN: [id number]  Chief Complaint  Patient presents with   Callouses    CORN RIGHT 5TH    77 y.o. female presents for concern of left foot pain. Relates a lesion in this area that has been very painful. . Relates burning and tingling in their feet. Patient is diabetic and last A1c was  Lab Results  Component Value Date   HGBA1C 6.3 (H) 01/04/2021   .   PCP:  Shon Hale, MD    . Denies any other pedal complaints. Denies n/v/f/c.   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 CFT <3 seconds. Absent hair growth on digits. Edema noted to bilateral lower extremities. Xerosis noted bilaterally.  Skin. No lacerations or abrasions bilateral feet. Nails 1-5 bilateral  are thickened discolored and  with subungual debris. Hyperkeratotic lesion noted to right lateral fifth digit very tender to touch Musculoskeletal: MMT 5/5 bilateral lower extremities in DF, PF, Inversion and Eversion. Deceased ROM in DF of ankle joint.  Neurological: Sensation intact to light touch. Protective sensation diminished bilateral.    Assessment:   1. Callus of foot   2. Type 2 diabetes mellitus with peripheral neuropathy (HCC)      Plan:  Patient was evaluated and treated and all questions answered. -Discussed and educated patient on diabetic foot care, especially with  regards to the vascular, neurological and musculoskeletal systems.  -Stressed  the importance of good glycemic control and the detriment of not  controlling glucose levels in relation to the foot. -Discussed supportive shoes at all times and checking feet regularly.  -Hyperkeratotic tissue debrided without incident.   -Answered all patient questions -Patient to return  in 3 months for at risk foot care -Patient advised to call the office if any problems or questions arise in the meantime.   Louann Sjogren, DPM

## 2023-08-01 NOTE — Progress Notes (Signed)
Remote pacemaker transmission.   

## 2023-08-05 DIAGNOSIS — M1711 Unilateral primary osteoarthritis, right knee: Secondary | ICD-10-CM | POA: Diagnosis not present

## 2023-08-05 DIAGNOSIS — M1712 Unilateral primary osteoarthritis, left knee: Secondary | ICD-10-CM | POA: Diagnosis not present

## 2023-08-05 DIAGNOSIS — M170 Bilateral primary osteoarthritis of knee: Secondary | ICD-10-CM | POA: Diagnosis not present

## 2023-08-28 DIAGNOSIS — M79672 Pain in left foot: Secondary | ICD-10-CM | POA: Diagnosis not present

## 2023-08-28 DIAGNOSIS — L84 Corns and callosities: Secondary | ICD-10-CM | POA: Diagnosis not present

## 2023-09-10 ENCOUNTER — Encounter: Payer: Self-pay | Admitting: Podiatry

## 2023-09-10 ENCOUNTER — Ambulatory Visit: Payer: Medicare PPO | Admitting: Podiatry

## 2023-09-10 DIAGNOSIS — L84 Corns and callosities: Secondary | ICD-10-CM

## 2023-09-10 DIAGNOSIS — M79674 Pain in right toe(s): Secondary | ICD-10-CM

## 2023-09-10 DIAGNOSIS — E1142 Type 2 diabetes mellitus with diabetic polyneuropathy: Secondary | ICD-10-CM

## 2023-09-10 DIAGNOSIS — B351 Tinea unguium: Secondary | ICD-10-CM

## 2023-09-10 DIAGNOSIS — M79675 Pain in left toe(s): Secondary | ICD-10-CM | POA: Diagnosis not present

## 2023-09-10 NOTE — Progress Notes (Addendum)
This patient returns to my office for at risk foot care.  This patient requires this care by a professional since this patient will be at risk due to having thrombocytopenia and diabetes.This patient is unable to cut nails himself since the patient cannot reach his nails.These nails are painful walking and wearing shoes.  This patient presents for at risk foot care today.  General Appearance  Alert, conversant and in no acute stress.  Vascular  Dorsalis pedis and posterior tibial  pulses are palpable  bilaterally.  Capillary return is within normal limits  bilaterally. Temperature is within normal limits  bilaterally.  Neurologic  Senn-Weinstein monofilament wire test within normal limits  bilaterally. Muscle power within normal limits bilaterally.  Nails Thick disfigured discolored nails with subungual debris  from second to fifth toes bilaterally. No evidence of bacterial infection or drainage bilaterally.  Orthopedic  No limitations of motion  feet .  No crepitus or effusions noted.  No bony pathology or digital deformities noted.  Skin  normotropic skin with no porokeratosis noted bilaterally.  No signs of infections or ulcers noted.   Son requested revitaderm for his mothers feet.  It was dispensed but I warned him that his mothers skin is thin.  Onychomycosis  Pain in right toes  Pain in left toes  Consent was obtained for treatment procedures.   Mechanical debridement of nails 1-5  bilaterally performed with a nail nipper.  Filed with dremel without incident.    Return office visit   3 months                   Told patient to return for periodic foot care and evaluation due to potential at risk complications.   Helane Gunther DPM

## 2023-09-30 DIAGNOSIS — Z01818 Encounter for other preprocedural examination: Secondary | ICD-10-CM | POA: Diagnosis not present

## 2023-09-30 DIAGNOSIS — H2511 Age-related nuclear cataract, right eye: Secondary | ICD-10-CM | POA: Diagnosis not present

## 2023-10-08 DIAGNOSIS — H2513 Age-related nuclear cataract, bilateral: Secondary | ICD-10-CM | POA: Diagnosis not present

## 2023-10-08 DIAGNOSIS — H2511 Age-related nuclear cataract, right eye: Secondary | ICD-10-CM | POA: Diagnosis not present

## 2023-10-08 DIAGNOSIS — E1136 Type 2 diabetes mellitus with diabetic cataract: Secondary | ICD-10-CM | POA: Diagnosis not present

## 2023-10-14 ENCOUNTER — Ambulatory Visit: Payer: Medicare PPO

## 2023-10-23 DIAGNOSIS — R413 Other amnesia: Secondary | ICD-10-CM | POA: Diagnosis not present

## 2023-10-23 DIAGNOSIS — R911 Solitary pulmonary nodule: Secondary | ICD-10-CM | POA: Diagnosis not present

## 2023-10-23 DIAGNOSIS — E119 Type 2 diabetes mellitus without complications: Secondary | ICD-10-CM | POA: Diagnosis not present

## 2023-10-23 DIAGNOSIS — Z853 Personal history of malignant neoplasm of breast: Secondary | ICD-10-CM | POA: Diagnosis not present

## 2023-10-23 DIAGNOSIS — Z950 Presence of cardiac pacemaker: Secondary | ICD-10-CM | POA: Diagnosis not present

## 2023-10-23 DIAGNOSIS — Z79899 Other long term (current) drug therapy: Secondary | ICD-10-CM | POA: Diagnosis not present

## 2023-10-23 DIAGNOSIS — C519 Malignant neoplasm of vulva, unspecified: Secondary | ICD-10-CM | POA: Diagnosis not present

## 2023-10-28 ENCOUNTER — Ambulatory Visit: Payer: Medicare PPO | Admitting: Podiatry

## 2023-10-28 DIAGNOSIS — E1142 Type 2 diabetes mellitus with diabetic polyneuropathy: Secondary | ICD-10-CM

## 2023-10-28 DIAGNOSIS — L84 Corns and callosities: Secondary | ICD-10-CM | POA: Diagnosis not present

## 2023-10-28 NOTE — Progress Notes (Signed)
  Subjective:  Patient ID: [name], female    DOB: 1946-03-27,   MRN: [id number]  No chief complaint on file.   78 y.o. female presents for concern of left foot pain. Relates a lesion in this area that has been very painful. . Relates burning and tingling in their feet. Patient is diabetic and last A1c was  Lab Results  Component Value Date   HGBA1C 6.3 (H) 01/04/2021   .   PCP:  Chrystal Lamarr RAMAN, MD    . Denies any other pedal complaints. Denies n/v/f/c.   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 CFT <3 seconds. Absent hair growth on digits. Edema noted to bilateral lower extremities. Xerosis noted bilaterally.  Skin. No lacerations or abrasions bilateral feet. Nails 1-5 bilateral  are thickened discolored and  with subungual debris. Hyperkeratotic lesion noted to right lateral fifth digit very tender to touch Musculoskeletal: MMT 5/5 bilateral lower extremities in DF, PF, Inversion and Eversion. Deceased ROM in DF of ankle joint.  Neurological: Sensation intact to light touch. Protective sensation diminished bilateral.    Assessment:   1. Callus of foot   2. Type 2 diabetes mellitus with peripheral neuropathy (HCC)      Plan:  Patient was evaluated and treated and all questions answered. -Discussed and educated patient on diabetic foot care, especially with  regards to the vascular, neurological and musculoskeletal systems.  -Stressed the importance of good glycemic control and  the detriment of not  controlling glucose levels in relation to the foot. -Discussed supportive shoes at all times and checking feet regularly.  -Hyperkeratotic tissue on right lateral fifth digit  debrided without incident.   -Answered all patient questions -Patient to return  in 3 months for at risk foot care -Patient advised to call the office if any problems or questions arise in the meantime.   Asberry Failing, DPM

## 2023-11-06 ENCOUNTER — Other Ambulatory Visit: Payer: Self-pay

## 2023-11-06 ENCOUNTER — Emergency Department (HOSPITAL_COMMUNITY)
Admission: EM | Admit: 2023-11-06 | Discharge: 2023-11-07 | Discharge status: Against Medical Advice | Payer: Medicare PPO | Attending: Emergency Medicine | Admitting: Emergency Medicine

## 2023-11-06 ENCOUNTER — Emergency Department (HOSPITAL_COMMUNITY): Payer: Medicare PPO

## 2023-11-06 ENCOUNTER — Encounter (HOSPITAL_COMMUNITY): Payer: Self-pay

## 2023-11-06 DIAGNOSIS — S199XXA Unspecified injury of neck, initial encounter: Secondary | ICD-10-CM | POA: Diagnosis not present

## 2023-11-06 DIAGNOSIS — R220 Localized swelling, mass and lump, head: Secondary | ICD-10-CM | POA: Diagnosis not present

## 2023-11-06 DIAGNOSIS — W19XXXA Unspecified fall, initial encounter: Secondary | ICD-10-CM | POA: Insufficient documentation

## 2023-11-06 DIAGNOSIS — S0990XA Unspecified injury of head, initial encounter: Secondary | ICD-10-CM | POA: Diagnosis not present

## 2023-11-06 DIAGNOSIS — S0003XA Contusion of scalp, initial encounter: Secondary | ICD-10-CM | POA: Diagnosis not present

## 2023-11-06 DIAGNOSIS — G319 Degenerative disease of nervous system, unspecified: Secondary | ICD-10-CM | POA: Diagnosis not present

## 2023-11-06 NOTE — ED Triage Notes (Signed)
Pt fell this afternoon, hitting her head. Not on thinners. Pt had knot on right side of head that has resolved at time of triage. Pt denies any complaints at time of triage.

## 2023-11-06 NOTE — ED Notes (Signed)
Pt. Left without being seen before triage

## 2023-11-06 NOTE — ED Provider Triage Note (Signed)
Emergency Medicine Provider Triage Evaluation Note  [name] , a 78 y.o. female  was evaluated in triage.  Pt complains of fall.  Review of Systems  Positive: Head injury/fall Negative: LOC vomiting  Physical Exam  BP (!) 140/80 (BP Location: Right Arm)   Pulse 74   Temp 99.3 F (37.4 C) (Oral)   Resp 15   Ht 5' (1.524 m)   Wt 46.7 kg   SpO2 100%   BMI 20.12 kg/m  Gen:   Awake, no distress   Resp:  Normal effort  MSK:   Moves extremities without difficulty  Other:  Scalp hematoma on right without wound.  Medical Decision Making  Medically screening exam initiated at 7:36 PM.  Appropriate orders placed.  [name] was informed that the remainder of the evaluation will be completed by another provider, this initial triage assessment does not replace that evaluation, and the importance of remaining in the ED until their evaluation is complete.  Not anticoagulated. No LOC. Has been up and walking since fall.    Elpidio Anis, PA-C 11/06/23 1610

## 2023-11-06 NOTE — ED Notes (Addendum)
Pt. Assisted to bathroom in wheelchair, pt. Had very small, brown bm. Pt. Cleaned up and new brief applied.

## 2023-11-12 DIAGNOSIS — H2513 Age-related nuclear cataract, bilateral: Secondary | ICD-10-CM | POA: Diagnosis not present

## 2023-11-12 DIAGNOSIS — F0390 Unspecified dementia without behavioral disturbance: Secondary | ICD-10-CM | POA: Diagnosis not present

## 2023-11-12 DIAGNOSIS — H2512 Age-related nuclear cataract, left eye: Secondary | ICD-10-CM | POA: Diagnosis not present

## 2023-12-16 DIAGNOSIS — F0390 Unspecified dementia without behavioral disturbance: Secondary | ICD-10-CM | POA: Diagnosis not present

## 2023-12-16 DIAGNOSIS — E43 Unspecified severe protein-calorie malnutrition: Secondary | ICD-10-CM | POA: Diagnosis not present

## 2023-12-16 DIAGNOSIS — F419 Anxiety disorder, unspecified: Secondary | ICD-10-CM | POA: Diagnosis not present

## 2024-01-06 ENCOUNTER — Ambulatory Visit (INDEPENDENT_AMBULATORY_CARE_PROVIDER_SITE_OTHER): Payer: Medicare PPO | Admitting: Podiatry

## 2024-01-06 DIAGNOSIS — Z91199 Patient's noncompliance with other medical treatment and regimen due to unspecified reason: Secondary | ICD-10-CM

## 2024-01-06 NOTE — Progress Notes (Signed)
 Cancel 24 hours, sick

## 2024-01-13 ENCOUNTER — Ambulatory Visit: Payer: Medicare PPO

## 2024-01-13 ENCOUNTER — Ambulatory Visit: Admitting: Podiatry

## 2024-01-13 ENCOUNTER — Encounter: Payer: Self-pay | Admitting: Podiatry

## 2024-01-13 DIAGNOSIS — B351 Tinea unguium: Secondary | ICD-10-CM

## 2024-01-13 DIAGNOSIS — M79675 Pain in left toe(s): Secondary | ICD-10-CM

## 2024-01-13 DIAGNOSIS — E1142 Type 2 diabetes mellitus with diabetic polyneuropathy: Secondary | ICD-10-CM | POA: Diagnosis not present

## 2024-01-13 DIAGNOSIS — I442 Atrioventricular block, complete: Secondary | ICD-10-CM

## 2024-01-13 DIAGNOSIS — M79674 Pain in right toe(s): Secondary | ICD-10-CM

## 2024-01-13 NOTE — Progress Notes (Signed)
 This patient returns to my office for at risk foot care.  This patient requires this care by a professional since this patient will be at risk due to having type 2 diabetes and thrombocytopenia. This patient is unable to cut nails herself since the patient cannot reach her nails.These nails are painful walking and wearing shoes.  She presents to the office with her son. This patient presents for at risk foot care today.  General Appearance  Alert, conversant and in no acute stress.  Vascular  Dorsalis pedis and posterior tibial  pulses are palpable  bilaterally.  Capillary return is within normal limits  bilaterally. Temperature is within normal limits  bilaterally.  Neurologic  Senn-Weinstein monofilament wire test within normal limits  bilaterally. Muscle power within normal limits bilaterally.  Nails Thick disfigured discolored nails with subungual debris  from hallux to fifth toes bilaterally. No evidence of bacterial infection or drainage bilaterally.  Orthopedic  No limitations of motion  feet .  No crepitus or effusions noted.  No bony pathology or digital deformities noted.  Skin  normotropic skin with no porokeratosis noted bilaterally.  No signs of infections or ulcers noted.     Onychomycosis  Pain in right toes  Pain in left toes  Consent was obtained for treatment procedures.   Mechanical debridement of nails 1-5  bilaterally performed with a nail nipper.  Filed with dremel without incident.    Return office visit    3 months                  Told patient to return for periodic foot care and evaluation due to potential at risk complications.   Ruffin Cotton DPM

## 2024-01-14 ENCOUNTER — Encounter: Payer: Self-pay | Admitting: Internal Medicine

## 2024-01-14 LAB — CUP PACEART REMOTE DEVICE CHECK
Battery Remaining Longevity: 19 mo
Battery Voltage: 2.88 V
Brady Statistic AP VP Percent: 15.11 %
Brady Statistic AP VS Percent: 0 %
Brady Statistic AS VP Percent: 84.86 %
Brady Statistic AS VS Percent: 0.03 %
Brady Statistic RA Percent Paced: 15.06 %
Brady Statistic RV Percent Paced: 99.97 %
Date Time Interrogation Session: 20250422080325
Implantable Lead Connection Status: 753985
Implantable Lead Connection Status: 753985
Implantable Lead Implant Date: 20191024
Implantable Lead Implant Date: 20191024
Implantable Lead Location: 753859
Implantable Lead Location: 753860
Implantable Lead Model: 3830
Implantable Lead Model: 5076
Implantable Pulse Generator Implant Date: 20191024
Lead Channel Impedance Value: 285 Ohm
Lead Channel Impedance Value: 285 Ohm
Lead Channel Impedance Value: 361 Ohm
Lead Channel Impedance Value: 399 Ohm
Lead Channel Pacing Threshold Amplitude: 0.625 V
Lead Channel Pacing Threshold Amplitude: 2.125 V
Lead Channel Pacing Threshold Pulse Width: 0.4 ms
Lead Channel Pacing Threshold Pulse Width: 0.4 ms
Lead Channel Sensing Intrinsic Amplitude: 1.75 mV
Lead Channel Sensing Intrinsic Amplitude: 1.75 mV
Lead Channel Sensing Intrinsic Amplitude: 10.125 mV
Lead Channel Sensing Intrinsic Amplitude: 10.125 mV
Lead Channel Setting Pacing Amplitude: 2 V
Lead Channel Setting Pacing Amplitude: 4.25 V
Lead Channel Setting Pacing Pulse Width: 0.4 ms
Lead Channel Setting Sensing Sensitivity: 1.2 mV
Zone Setting Status: 755011
Zone Setting Status: 755011

## 2024-02-17 DIAGNOSIS — D649 Anemia, unspecified: Secondary | ICD-10-CM | POA: Diagnosis not present

## 2024-02-17 DIAGNOSIS — F03A4 Unspecified dementia, mild, with anxiety: Secondary | ICD-10-CM | POA: Diagnosis not present

## 2024-02-17 DIAGNOSIS — C519 Malignant neoplasm of vulva, unspecified: Secondary | ICD-10-CM | POA: Diagnosis not present

## 2024-02-20 DIAGNOSIS — C519 Malignant neoplasm of vulva, unspecified: Secondary | ICD-10-CM | POA: Diagnosis not present

## 2024-02-26 NOTE — Addendum Note (Signed)
 Addended by: Lott Rouleau A on: 02/26/2024 03:13 PM   Modules accepted: Orders

## 2024-02-26 NOTE — Progress Notes (Signed)
 Remote pacemaker transmission.

## 2024-03-18 DIAGNOSIS — M170 Bilateral primary osteoarthritis of knee: Secondary | ICD-10-CM | POA: Diagnosis not present

## 2024-03-18 DIAGNOSIS — M1712 Unilateral primary osteoarthritis, left knee: Secondary | ICD-10-CM | POA: Diagnosis not present

## 2024-03-18 DIAGNOSIS — M1711 Unilateral primary osteoarthritis, right knee: Secondary | ICD-10-CM | POA: Diagnosis not present

## 2024-04-13 ENCOUNTER — Ambulatory Visit: Payer: Medicare PPO

## 2024-04-13 DIAGNOSIS — I442 Atrioventricular block, complete: Secondary | ICD-10-CM

## 2024-04-14 LAB — CUP PACEART REMOTE DEVICE CHECK
Battery Remaining Longevity: 19 mo
Battery Voltage: 2.87 V
Brady Statistic AP VP Percent: 8.55 %
Brady Statistic AP VS Percent: 0 %
Brady Statistic AS VP Percent: 91.43 %
Brady Statistic AS VS Percent: 0.02 %
Brady Statistic RA Percent Paced: 8.6 %
Brady Statistic RV Percent Paced: 99.98 %
Date Time Interrogation Session: 20250721191202
Implantable Lead Connection Status: 753985
Implantable Lead Connection Status: 753985
Implantable Lead Implant Date: 20191024
Implantable Lead Implant Date: 20191024
Implantable Lead Location: 753859
Implantable Lead Location: 753860
Implantable Lead Model: 3830
Implantable Lead Model: 5076
Implantable Pulse Generator Implant Date: 20191024
Lead Channel Impedance Value: 304 Ohm
Lead Channel Impedance Value: 323 Ohm
Lead Channel Impedance Value: 399 Ohm
Lead Channel Impedance Value: 418 Ohm
Lead Channel Pacing Threshold Amplitude: 0.5 V
Lead Channel Pacing Threshold Amplitude: 2 V
Lead Channel Pacing Threshold Pulse Width: 0.4 ms
Lead Channel Pacing Threshold Pulse Width: 0.4 ms
Lead Channel Sensing Intrinsic Amplitude: 1.875 mV
Lead Channel Sensing Intrinsic Amplitude: 1.875 mV
Lead Channel Sensing Intrinsic Amplitude: 4 mV
Lead Channel Sensing Intrinsic Amplitude: 4 mV
Lead Channel Setting Pacing Amplitude: 2 V
Lead Channel Setting Pacing Amplitude: 4 V
Lead Channel Setting Pacing Pulse Width: 0.4 ms
Lead Channel Setting Sensing Sensitivity: 1.2 mV
Zone Setting Status: 755011
Zone Setting Status: 755011

## 2024-04-18 ENCOUNTER — Ambulatory Visit: Payer: Self-pay | Admitting: Internal Medicine

## 2024-04-19 DIAGNOSIS — C519 Malignant neoplasm of vulva, unspecified: Secondary | ICD-10-CM | POA: Diagnosis not present

## 2024-04-19 DIAGNOSIS — K529 Noninfective gastroenteritis and colitis, unspecified: Secondary | ICD-10-CM | POA: Diagnosis not present

## 2024-04-19 DIAGNOSIS — Z741 Need for assistance with personal care: Secondary | ICD-10-CM | POA: Diagnosis not present

## 2024-04-19 DIAGNOSIS — L308 Other specified dermatitis: Secondary | ICD-10-CM | POA: Diagnosis not present

## 2024-04-19 DIAGNOSIS — F0150 Vascular dementia without behavioral disturbance: Secondary | ICD-10-CM | POA: Diagnosis not present

## 2024-05-27 DIAGNOSIS — C519 Malignant neoplasm of vulva, unspecified: Secondary | ICD-10-CM | POA: Diagnosis not present

## 2024-06-24 ENCOUNTER — Encounter: Admitting: Internal Medicine

## 2024-06-25 ENCOUNTER — Ambulatory Visit: Attending: Internal Medicine | Admitting: Internal Medicine

## 2024-06-25 ENCOUNTER — Encounter: Payer: Self-pay | Admitting: Internal Medicine

## 2024-06-25 VITALS — BP 118/66 | HR 65 | Ht 60.0 in | Wt 111.9 lb

## 2024-06-25 DIAGNOSIS — Z950 Presence of cardiac pacemaker: Secondary | ICD-10-CM | POA: Diagnosis not present

## 2024-06-25 NOTE — Patient Instructions (Signed)

## 2024-06-25 NOTE — Progress Notes (Signed)
 Remote PPM Transmission

## 2024-06-25 NOTE — Progress Notes (Signed)
 HPI Mrs. [name] returns today for ongoing evaluation and management of her PPM. She is a pleasant 78 yo woman with a h/o breast CA, s/p treatment, DM, and CHB, s/p PPM insertion approximately 5 years ago. In the interim she has had stabilized. She has chronic dyspnea. She has occasional palpitations. No syncope. No other focal neuro findings.  Allergies  Allergen Reactions   Duloxetine Itching    Cymbalta   Duloxetine Hcl     Other reaction(s): itching     Current Outpatient Medications  Medication Sig Dispense Refill   donepezil  (ARICEPT ) 10 MG tablet Take 10 mg by mouth at bedtime.     flecainide  (TAMBOCOR ) 50 MG tablet Take 1 tablet (50 mg total) by mouth 2 (two) times daily. 180 tablet 3   sertraline  (ZOLOFT ) 100 MG tablet Take 1 tablet (100 mg total) by mouth daily. 90 tablet 3   colestipol (COLESTID) 1 g tablet 2 tablets (Patient not taking: Reported on 06/25/2024)     Cyanocobalamin (VITAMIN B 12 PO) Take 1,000 mcg by mouth daily. (Patient not taking: Reported on 06/25/2024)     ergocalciferol  (VITAMIN D2) 50000 UNITS capsule Take 50,000 Units by mouth every Tuesday. (Patient not taking: Reported on 06/25/2024)     Ferrous Sulfate (IRON) 325 (65 Fe) MG TABS 1 tablet (Patient not taking: Reported on 06/25/2024)     HYDROcodone -acetaminophen  (NORCO/VICODIN) 5-325 MG tablet Take 1 tablet by mouth 3 (three) times daily as needed. (Patient not taking: Reported on 06/25/2024)     ipratropium (ATROVENT) 0.06 % nasal spray 2 sprays in each nostril (Patient not taking: Reported on 06/25/2024)     Iron-FA-B Cmp-C-Biot-Probiotic (FUSION PLUS) CAPS 1 capsule (Patient not taking: Reported on 06/25/2024)     metoprolol  tartrate (LOPRESSOR ) 25 MG tablet Take 1 tablet (25 mg total) by mouth 2 (two) times daily. (Patient not taking: Reported on 06/25/2024) 180 tablet 2   Omega-3 Fatty Acids (FISH OIL) 1200 MG CAPS Take 2,400 mg by mouth every morning. (Patient not taking: Reported on 06/25/2024)      ondansetron  (ZOFRAN ) 4 MG tablet Take 1 tablet (4 mg total) by mouth every 6 (six) hours as needed for nausea. (Patient not taking: Reported on 06/25/2024) 20 tablet 0   traMADol  (ULTRAM ) 50 MG tablet Take 1 tablet (50 mg total) by mouth every 6 (six) hours as needed for severe pain. (Patient not taking: Reported on 06/25/2024) 30 tablet 0   No current facility-administered medications for this visit.     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   Past Surgical History:  Procedure Laterality Date   ABDOMINAL HYSTERECTOMY     complete   BREAST SURGERY Left 2006   left breast lumpectomy , chemo and radiation done   COLONOSCOPY WITH PROPOFOL  N/A 07/24/2015   Procedure: COLONOSCOPY WITH PROPOFOL ;  Surgeon: Gladis POUR  Vicci, MD;  Location: [facility];  Service: Endoscopy;  Laterality: N/A;   EYE SURGERY     removal of growth 35 years ago   HEMORROIDECTOMY     38 years ago, no present symtoms   IR IMAGING GUIDED PORT INSERTION  06/13/2020   KNEE SURGERY Bilateral    LAPAROTOMY N/A 01/07/2021   Procedure: EXPLORATORY LAPAROTOMY POSSIBLE SMALL BOWEL RESCTION;  Surgeon: Curvin Deward MOULD, MD;  Location: [facility];  Service: General;  Laterality: N/A;   PACEMAKER IMPLANT N/A 07/16/2018   Procedure: PACEMAKER IMPLANT;  Surgeon: Waddell Danelle ORN, MD;  Location: [facility];  Service: Cardiovascular;  Laterality: N/A;   RADICAL VULVECTOMY N/A 02/01/2016   Procedure: RADICAL VULVECTOMY;  Surgeon: Maurilio Ship,  MD;  Location: [facility];  Service: Gynecology;  Laterality: N/A;     Family History  Problem Relation Age of Onset   Dementia Mother    Other Father        possible Dementia   Prostate cancer Father    HIV Brother    Kidney failure Brother      Social History   Socioeconomic History   Marital status: Married    Spouse name: Not on file   Number of children: 2   Years of education: Not on file   Highest education level: Master's degree (e.g., MA, MS, MEng, MEd, MSW, MBA)  Occupational History   Not on file  Tobacco Use   Smoking status: Never   Smokeless tobacco: Never  Vaping Use   Vaping status: Never Used  Substance and Sexual Activity   Alcohol use: Never   Drug use: Never   Sexual activity: Yes  Other Topics Concern   Not on file  Social History Narrative   Lives at home with husband   Right handed   Caffeine: never   Social Drivers of Corporate investment banker Strain: Low Risk  (05/26/2024)   Received from [facility]   Overall Financial Resource Strain (CARDIA)    How hard is it for you to pay for the very basics like food, housing, medical care, and heating?: Not hard at all  Food Insecurity: No Food Insecurity (05/26/2024)   Received from [facility]   Hunger Vital Sign    Within the past 12 months, you worried that your food would run out before you got the money to buy more.: Never true    Within the past 12 months, the food you bought just didn't last and you didn't have money to get more.: Never true  Transportation Needs: No Transportation Needs (05/26/2024)   Received from [facility] - Transportation    In the past 12 months, has lack of transportation kept you from medical appointments or from getting medications?: No    In the past 12 months, has lack of transportation kept you from meetings, work, or from getting things needed for daily living?: No  Physical Activity: Inactive (05/26/2024)   Received from [facility]   Exercise  Vital Sign    On average, how many days per week do you engage in moderate to strenuous exercise (like a brisk walk)?: 0 days    Minutes of Exercise per Session: Not on file  Stress: No Stress Concern Present (05/26/2024)   Received from [facility] - Occupational Stress Questionnaire    Do you feel stress - tense, restless, nervous, or anxious, or unable to sleep at night  because your mind is troubled all the time - these days?: Only a little  Social Connections: Socially Integrated (05/26/2024)   Received from [facility]   Social Network    How would you rate your social network (family, work, friends)?: Good participation with social networks  Intimate Partner Violence: Not At Risk (05/26/2024)   Received from [facility]   HITS    Over the last 12 months how often did your partner physically hurt you?: Never    Over the last 12 months how often did your partner insult you or talk down to you?: Never    Over the last 12 months how often did your partner threaten you with physical harm?: Never    Over the last 12 months how often did your partner scream or curse at you?: Never     BP 118/66   Pulse 65   Ht 5' (1.524 m)   Wt 111 lb 14.4 oz (50.8 kg)   SpO2 99%   BMI 21.85 kg/m   Physical Exam:  Well appearing NAD HEENT: Unremarkable Neck:  No JVD, no thyromegally Lymphatics:  No adenopathy Back:  No CVA tenderness Lungs:  Clear HEART:  Regular rate rhythm, no murmurs, no rubs, no clicks Abd:  soft, positive bowel sounds, no organomegally, no rebound, no guarding Ext:  2 plus pulses, no edema, no cyanosis, no clubbing Skin:  No rashes no nodules Neuro:  CN II through XII intact, motor grossly intact  EKG - AV pacing  DEVICE  Normal device function.  See PaceArt for details.   Assess/Plan:  CHB - she is asymptomatic, s/p PPM insertion. 2. HTN - her blood pressure is well controlled today. 3. 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 She paces in the ventricle 99% of the time. 4.  Atrial fib - I would like her to start eliquis. We will check a bmp to provide dosing rec's.   Danelle Waddell HERO.D.

## 2024-06-28 ENCOUNTER — Other Ambulatory Visit: Payer: Self-pay

## 2024-06-28 ENCOUNTER — Emergency Department (HOSPITAL_COMMUNITY)

## 2024-06-28 ENCOUNTER — Emergency Department (HOSPITAL_COMMUNITY)
Admission: EM | Admit: 2024-06-28 | Discharge: 2024-06-28 | Disposition: A | Discharge status: Home / Self Care | Attending: Emergency Medicine | Admitting: Emergency Medicine

## 2024-06-28 DIAGNOSIS — Z8659 Personal history of other mental and behavioral disorders: Secondary | ICD-10-CM

## 2024-06-28 DIAGNOSIS — R1032 Left lower quadrant pain: Secondary | ICD-10-CM | POA: Diagnosis not present

## 2024-06-28 DIAGNOSIS — S199XXA Unspecified injury of neck, initial encounter: Secondary | ICD-10-CM | POA: Diagnosis not present

## 2024-06-28 DIAGNOSIS — R410 Disorientation, unspecified: Secondary | ICD-10-CM | POA: Diagnosis not present

## 2024-06-28 DIAGNOSIS — E119 Type 2 diabetes mellitus without complications: Secondary | ICD-10-CM | POA: Insufficient documentation

## 2024-06-28 DIAGNOSIS — M4802 Spinal stenosis, cervical region: Secondary | ICD-10-CM | POA: Diagnosis not present

## 2024-06-28 DIAGNOSIS — M50322 Other cervical disc degeneration at C5-C6 level: Secondary | ICD-10-CM | POA: Diagnosis not present

## 2024-06-28 DIAGNOSIS — R1031 Right lower quadrant pain: Secondary | ICD-10-CM | POA: Diagnosis not present

## 2024-06-28 DIAGNOSIS — R001 Bradycardia, unspecified: Secondary | ICD-10-CM | POA: Diagnosis not present

## 2024-06-28 DIAGNOSIS — F0390 Unspecified dementia without behavioral disturbance: Secondary | ICD-10-CM | POA: Diagnosis not present

## 2024-06-28 DIAGNOSIS — I10 Essential (primary) hypertension: Secondary | ICD-10-CM | POA: Diagnosis not present

## 2024-06-28 DIAGNOSIS — W19XXXA Unspecified fall, initial encounter: Secondary | ICD-10-CM | POA: Diagnosis not present

## 2024-06-28 DIAGNOSIS — M47812 Spondylosis without myelopathy or radiculopathy, cervical region: Secondary | ICD-10-CM | POA: Diagnosis not present

## 2024-06-28 DIAGNOSIS — G309 Alzheimer's disease, unspecified: Secondary | ICD-10-CM | POA: Insufficient documentation

## 2024-06-28 DIAGNOSIS — S0990XA Unspecified injury of head, initial encounter: Secondary | ICD-10-CM | POA: Diagnosis not present

## 2024-06-28 DIAGNOSIS — R9082 White matter disease, unspecified: Secondary | ICD-10-CM | POA: Diagnosis not present

## 2024-06-28 LAB — CBC WITH DIFFERENTIAL/PLATELET
Abs Immature Granulocytes: 0.01 K/uL (ref 0.00–0.07)
Basophils Absolute: 0 K/uL (ref 0.0–0.1)
Basophils Relative: 1 %
Eosinophils Absolute: 0.2 K/uL (ref 0.0–0.5)
Eosinophils Relative: 3 %
HCT: 31.1 % — ABNORMAL LOW (ref 36.0–46.0)
Hemoglobin: 9.7 g/dL — ABNORMAL LOW (ref 12.0–15.0)
Immature Granulocytes: 0 %
Lymphocytes Relative: 26 %
Lymphs Abs: 1.6 K/uL (ref 0.7–4.0)
MCH: 28.4 pg (ref 26.0–34.0)
MCHC: 31.2 g/dL (ref 30.0–36.0)
MCV: 90.9 fL (ref 80.0–100.0)
Monocytes Absolute: 0.5 K/uL (ref 0.1–1.0)
Monocytes Relative: 8 %
Neutro Abs: 4 K/uL (ref 1.7–7.7)
Neutrophils Relative %: 62 %
Platelets: 178 K/uL (ref 150–400)
RBC: 3.42 MIL/uL — ABNORMAL LOW (ref 3.87–5.11)
RDW: 15.1 % (ref 11.5–15.5)
WBC: 6.3 K/uL (ref 4.0–10.5)
nRBC: 0 % (ref 0.0–0.2)

## 2024-06-28 LAB — URINALYSIS, ROUTINE W REFLEX MICROSCOPIC
Bilirubin Urine: NEGATIVE
Glucose, UA: NEGATIVE mg/dL
Ketones, ur: NEGATIVE mg/dL
Nitrite: NEGATIVE
Protein, ur: NEGATIVE mg/dL
Specific Gravity, Urine: 1.016 (ref 1.005–1.030)
pH: 5 (ref 5.0–8.0)

## 2024-06-28 LAB — COMPREHENSIVE METABOLIC PANEL WITH GFR
ALT: 17 U/L (ref 0–44)
AST: 22 U/L (ref 15–41)
Albumin: 3.2 g/dL — ABNORMAL LOW (ref 3.5–5.0)
Alkaline Phosphatase: 67 U/L (ref 38–126)
Anion gap: 8 (ref 5–15)
BUN: 29 mg/dL — ABNORMAL HIGH (ref 8–23)
CO2: 24 mmol/L (ref 22–32)
Calcium: 8.8 mg/dL — ABNORMAL LOW (ref 8.9–10.3)
Chloride: 108 mmol/L (ref 98–111)
Creatinine, Ser: 1.18 mg/dL — ABNORMAL HIGH (ref 0.44–1.00)
GFR, Estimated: 47 mL/min — ABNORMAL LOW (ref >60)
Glucose, Bld: 78 mg/dL (ref 70–99)
Potassium: 3.8 mmol/L (ref 3.5–5.1)
Sodium: 140 mmol/L (ref 135–145)
Total Bilirubin: 0.6 mg/dL (ref 0.0–1.2)
Total Protein: 6.2 g/dL — ABNORMAL LOW (ref 6.5–8.1)

## 2024-06-28 LAB — CBG MONITORING, ED: Glucose-Capillary: 75 mg/dL (ref 70–99)

## 2024-06-28 NOTE — ED Notes (Signed)
 Called microbiology to add on urine culture.

## 2024-06-28 NOTE — ED Notes (Signed)
 PER nurse tech urine sent. PT was able to walk to the rest room.

## 2024-06-28 NOTE — ED Provider Notes (Signed)
 [facility] EMERGENCY DEPARTMENT AT [facility] Provider Note   CSN: [id number] Arrival date & time: 06/28/24  1104     Patient presents with: No chief complaint on file.   [name] is a 78 y.o. female.   HPI 78 year old female brought into the ER by EMS after being found wandering.  There was report of a possible fall.  [place].  I talked to the son later and he notes this has happened once before and the husband found her missing today.  Otherwise while he is talking to her at the bedside she seems to be acting her normal, which she has a baseline of dementia.  Patient tells me that she is having some bilateral groin pain.  She is confused as to why she is here and thought she was coming from the doctor's office.  Prior to Admission medications   Medication Sig Start Date End Date Taking? Authorizing Provider  colestipol (COLESTID) 1 g tablet 2 tablets Patient not taking: Reported on 06/25/2024 02/21/21   [provider]  Cyanocobalamin (VITAMIN B 12 PO) Take 1,000 mcg by mouth daily. Patient not taking: Reported on 06/25/2024    [provider]  donepezil  (ARICEPT ) 10 MG tablet Take 10 mg by mouth at bedtime.    [provider]  ergocalciferol  (VITAMIN D2) 50000 UNITS capsule Take 50,000 Units by mouth every Tuesday. Patient not taking: Reported on 06/25/2024    [provider]  Ferrous Sulfate (IRON) 325 (65 Fe) MG TABS 1 tablet Patient not taking: Reported on 06/25/2024    [provider]  flecainide  (TAMBOCOR ) 50 MG tablet Take 1 tablet (50 mg total) by mouth 2 (two) times daily. 10/20/18   Waddell Danelle ORN, MD  HYDROcodone -acetaminophen  (NORCO/VICODIN) 5-325 MG tablet Take 1 tablet by mouth 3 (three) times daily as needed. Patient not taking: Reported on 06/25/2024 11/13/21   [provider]  ipratropium (ATROVENT) 0.06 % nasal spray 2 sprays in each nostril Patient not taking: Reported on 06/25/2024  08/06/21   [provider]  Iron-FA-B Cmp-C-Biot-Probiotic (FUSION PLUS) CAPS 1 capsule Patient not taking: Reported on 06/25/2024 08/21/21   [provider]  metoprolol  tartrate (LOPRESSOR ) 25 MG tablet Take 1 tablet (25 mg total) by mouth 2 (two) times daily. Patient not taking: Reported on 06/25/2024 03/29/19   Waddell Danelle ORN, MD  Omega-3 Fatty Acids (FISH OIL) 1200 MG CAPS Take 2,400 mg by mouth every morning. Patient not taking: Reported on 06/25/2024    [provider]  ondansetron  (ZOFRAN ) 4 MG tablet Take 1 tablet (4 mg total) by mouth every 6 (six) hours as needed for nausea. Patient not taking: Reported on 06/25/2024 01/13/21   Will Almarie MATSU, MD  sertraline  (ZOLOFT ) 100 MG tablet Take 1 tablet (100 mg total) by mouth daily. 11/30/19   Ines Onetha NOVAK, MD  traMADol  (ULTRAM ) 50 MG tablet Take 1 tablet (50 mg total) by mouth every 6 (six) hours as needed for severe pain. Patient not taking: Reported on 06/25/2024 01/13/21   Will Almarie MATSU, MD    Allergies: Duloxetine and Duloxetine hcl    Review of Systems  Unable to perform ROS: Dementia    Updated Vital Signs BP (!) 140/74   Pulse 72   Temp 98 F (36.7 C) (Oral)   Resp 19   SpO2 100%   Physical Exam Vitals and nursing note reviewed.  Constitutional:      Appearance: She is well-developed.  HENT:     Head: Normocephalic and atraumatic.  Cardiovascular:     Rate and Rhythm: Normal rate and regular rhythm.     Heart sounds: Normal heart sounds.  Pulmonary:     Effort: Pulmonary effort is normal.     Breath sounds: Normal breath sounds.  Abdominal:     Palpations: Abdomen is soft.     Tenderness: There is no abdominal tenderness.  Musculoskeletal:     Right hip: Normal range of motion.     Left hip: Normal range of motion.  Skin:    General: Skin is warm and dry.  Neurological:     Mental Status: She is alert.     Comments: Alert and oriented to self and place. Disoriented to time  and situation.  CN 3-12 grossly intact. 5/5 strength in all 4 extremities. Grossly normal sensation. Normal finger to nose.      (all labs ordered are listed, but only abnormal results are displayed) Labs Reviewed  COMPREHENSIVE METABOLIC PANEL WITH GFR - Abnormal; Notable for the following components:      Result Value   BUN 29 (*)    Creatinine, Ser 1.18 (*)    Calcium  8.8 (*)    Total Protein 6.2 (*)    Albumin  3.2 (*)    GFR, Estimated 47 (*)    All other components within normal limits  CBC WITH DIFFERENTIAL/PLATELET  URINALYSIS, ROUTINE W REFLEX MICROSCOPIC  CBC WITH DIFFERENTIAL/PLATELET  CBC WITH DIFFERENTIAL/PLATELET  CBG MONITORING, ED    EKG: None  Radiology: DG HIP UNILAT WITH PELVIS 2-3 VIEWS LEFT Result Date: 06/28/2024 EXAM: 2 or 3 VIEW(S) XRAY OF THE LEFT HIP 06/28/2024 12:47:00 PM COMPARISON: None available. CLINICAL HISTORY: 809823 Fall 190176. Fall,complaining of right posterior hip pain.Pt moves legs well. FINDINGS: BONES AND JOINTS: The bones are osteopenic. No acute fracture or focal osseous lesion. Mild bilateral hip arthritic changes. SOFT TISSUES: Surgical clips in the groin bilaterally. IMPRESSION: 1. No acute fracture or dislocation. Electronically signed by: Vanetta Chou MD 06/28/2024 01:33 PM EDT RP Workstation: [id number]   DG HIP UNILAT WITH PELVIS 2-3 VIEWS RIGHT Result Date: 06/28/2024 EXAM: 2 or 3 VIEW(S) XRAY OF THE LEFT HIP 06/28/2024 12:47:00 PM COMPARISON: None available. CLINICAL HISTORY: 809823 Fall 190176. Fall,complaining of right posterior hip pain.Pt moves legs well. FINDINGS: BONES AND JOINTS: The bones are osteopenic. No acute fracture or focal osseous lesion. Mild bilateral hip arthritic changes. SOFT TISSUES: Surgical clips in the groin bilaterally. IMPRESSION: 1. No acute fracture or dislocation. Electronically signed by: Vanetta Chou MD 06/28/2024 01:33 PM EDT RP Workstation: [id number]   CT Cervical Spine Wo Contrast Result  Date: 06/28/2024 EXAM: CT CERVICAL SPINE WITHOUT CONTRAST 06/28/2024 12:28:33 PM TECHNIQUE: CT of the cervical spine was performed without the administration of intravenous contrast. Multiplanar reformatted images are provided for review. Automated exposure control, iterative reconstruction, and/or weight based adjustment of the mA/kV was utilized to reduce the radiation dose to as low as reasonably achievable. COMPARISON: CT of the cervical spine dated 11/06/2023. CLINICAL HISTORY: Neck trauma (Age >= 65y). Triage notes; Pt BIB [facility] from highway side of the road by bystanders. Pt reported fell on grassy area and kept stating she need to go to multiple places and parents had kicked her out. Hx alzheimer and diabetes. [employer] contacted to see if patient is a possible missing person. BP 144/80, HR 90, SpO2 99%RA, Resp 20, CBG 103. FINDINGS: CERVICAL SPINE: BONES AND ALIGNMENT: No acute fracture. There is reversal of  the normal cervical lordosis with slight degenerative anterolisthesis at C3-C4 and C4-C5. DEGENERATIVE CHANGES: Chronic degenerative disc disease at C5-C6 and C6-C7 with mild central spinal canal stenosis and neural foraminal stenosis at both levels. SOFT TISSUES: No prevertebral soft tissue swelling. LUNG APICES: Mild reticular scarring in the lung apices bilaterally. VASCULATURE: Moderate calcific atheromatous disease within the carotid bulbs. IMPRESSION: 1. No acute abnormality of the cervical spine. 2. Reversal of the normal cervical lordosis with slight degenerative anterolisthesis at C3-4 and C4-5. 3. Chronic degenerative disc disease at C5-6 and C6-7 with mild central spinal canal stenosis and neural foraminal stenosis at both levels. Electronically signed by: Evalene Coho MD 06/28/2024 12:45 PM EDT RP Workstation: [id number]   CT Head Wo Contrast Result Date: 06/28/2024 EXAM: CT HEAD WITHOUT CONTRAST 06/28/2024 12:28:33 PM TECHNIQUE: CT of the head was performed without the administration of  intravenous contrast. Automated exposure control, iterative reconstruction, and/or weight based adjustment of the mA/kV was utilized to reduce the radiation dose to as low as reasonably achievable. COMPARISON: CT of the head dated 11/06/2023. CLINICAL HISTORY: Head trauma, minor (Age >= 65y). Triage notes; Pt BIB [facility] from highway side of [place] road by bystanders. Pt reported fell on grassy area and kept stating she need to go to multiple places and parents had kicked her out. Hx alzheimer and diabetes. [employer] contacted to see if patient is a possible missing person. BP 144/80, HR 90, SpO2 99%RA, Resp 20, CBG 103. FINDINGS: BRAIN AND VENTRICLES: No acute hemorrhage. No evidence of acute infarct. No hydrocephalus. No extra-axial collection. No mass effect or midline shift. There is age-related atrophy and mild-to-moderate periventricular white matter disease. ORBITS: No acute abnormality. Patient is status post bilateral lens replacement. SINUSES: No acute abnormality. SOFT TISSUES AND SKULL: No acute soft tissue abnormality. No skull fracture. IMPRESSION: 1. No acute intracranial abnormality. 2. Age-related atrophy and mild-to-moderate periventricular white matter disease. Electronically signed by: Evalene Coho MD 06/28/2024 12:43 PM EDT RP Workstation: [id number]     Procedures   Medications Ordered in the ED - No data to display                                  Medical Decision Making Amount and/or Complexity of Data Reviewed Independent Historian:     Details: Son Labs: ordered.    Details: Stable creatinine Radiology: ordered and independent interpretation performed.    Details: No head bleed   I doubt the patient is altered from her baseline based on presentation and discussing with son.  Given questionable fall CT head and C-spine were obtained.  X-rays were obtained of her hips given she is pointing to her bilateral groins but also tells me this has been an ongoing problem.  No  fractures.  Has been ambulatory here so I highly doubt occult fracture.  Unfortunately her CBC and her urine are delayed in the CBC needed to recollect.  These are pending but otherwise she appears well enough for discharge.  Care transferred to Dr. Zavitz.     Final diagnoses:  None    ED Discharge Orders     None          Freddi Hamilton, MD 06/28/24 1541

## 2024-06-28 NOTE — ED Notes (Signed)
Phlebotomy to get blood work

## 2024-06-28 NOTE — ED Notes (Signed)
 Pt unable to urinate at this time.

## 2024-06-28 NOTE — ED Notes (Signed)
 PT discharged to home with family member. Breathing is even and unlabored. Family and pt educated on follow up. Will follow up as directed.

## 2024-06-28 NOTE — ED Notes (Signed)
 PT is alert to self and is having trouble answering triage questions.

## 2024-06-28 NOTE — ED Provider Notes (Signed)
 Patient care signed out to follow-up CBC and urinalysis.  Urinalysis no signs of significant infection culture added on.  No fever normal vitals in the ED.  CBC resent and at patient's baseline.  Patient well-appearing on reassessment.  Husband returned and can have patient follow-up with primary doctor.  Patient stable for discharge.   Tonia Chew, MD 06/28/24 [phone]

## 2024-06-28 NOTE — Discharge Instructions (Signed)
 Follow-up with your primary doctor.  Return for new concerns.

## 2024-06-28 NOTE — ED Triage Notes (Signed)
 Pt [place] [facility] [place] by bystanders.  Pt reported fell on grassy area and kept stating she need to go to multiple places and parents had kicked her out.  Hx alzheimer and diabetes. [employer] contacted to see if patient is a possible missing person. BP 144/80, HR 90, SpO2 99%RA, Resp 20, CBG 103

## 2024-06-29 LAB — URINE CULTURE

## 2024-07-08 ENCOUNTER — Encounter: Admitting: Internal Medicine

## 2024-07-09 DIAGNOSIS — F01C0 Vascular dementia, severe, without behavioral disturbance, psychotic disturbance, mood disturbance, and anxiety: Secondary | ICD-10-CM | POA: Diagnosis not present

## 2024-07-09 DIAGNOSIS — N1831 Chronic kidney disease, stage 3a: Secondary | ICD-10-CM | POA: Diagnosis not present

## 2024-07-09 DIAGNOSIS — E43 Unspecified severe protein-calorie malnutrition: Secondary | ICD-10-CM | POA: Diagnosis not present

## 2024-07-09 DIAGNOSIS — Z0000 Encounter for general adult medical examination without abnormal findings: Secondary | ICD-10-CM | POA: Diagnosis not present

## 2024-07-09 DIAGNOSIS — K529 Noninfective gastroenteritis and colitis, unspecified: Secondary | ICD-10-CM | POA: Diagnosis not present

## 2024-07-09 DIAGNOSIS — Z23 Encounter for immunization: Secondary | ICD-10-CM | POA: Diagnosis not present

## 2024-07-09 DIAGNOSIS — R7301 Impaired fasting glucose: Secondary | ICD-10-CM | POA: Diagnosis not present

## 2024-07-09 DIAGNOSIS — D649 Anemia, unspecified: Secondary | ICD-10-CM | POA: Diagnosis not present

## 2024-07-09 DIAGNOSIS — R4689 Other symptoms and signs involving appearance and behavior: Secondary | ICD-10-CM | POA: Diagnosis not present

## 2024-07-13 ENCOUNTER — Ambulatory Visit: Payer: Medicare PPO

## 2024-07-13 DIAGNOSIS — I442 Atrioventricular block, complete: Secondary | ICD-10-CM

## 2024-07-14 LAB — CUP PACEART REMOTE DEVICE CHECK
Battery Remaining Longevity: 12 mo
Battery Voltage: 2.85 V
Brady Statistic AP VP Percent: 24.41 %
Brady Statistic AP VS Percent: 0 %
Brady Statistic AS VP Percent: 75.57 %
Brady Statistic AS VS Percent: 0.02 %
Brady Statistic RA Percent Paced: 24.37 %
Brady Statistic RV Percent Paced: 99.98 %
Date Time Interrogation Session: 20251020224926
Implantable Lead Connection Status: 753985
Implantable Lead Connection Status: 753985
Implantable Lead Implant Date: 20191024
Implantable Lead Implant Date: 20191024
Implantable Lead Location: 753859
Implantable Lead Location: 753860
Implantable Lead Model: 3830
Implantable Lead Model: 5076
Implantable Pulse Generator Implant Date: 20191024
Lead Channel Impedance Value: 285 Ohm
Lead Channel Impedance Value: 304 Ohm
Lead Channel Impedance Value: 361 Ohm
Lead Channel Impedance Value: 399 Ohm
Lead Channel Pacing Threshold Amplitude: 0.625 V
Lead Channel Pacing Threshold Amplitude: 1.625 V
Lead Channel Pacing Threshold Pulse Width: 0.4 ms
Lead Channel Pacing Threshold Pulse Width: 0.4 ms
Lead Channel Sensing Intrinsic Amplitude: 2 mV
Lead Channel Sensing Intrinsic Amplitude: 2 mV
Lead Channel Sensing Intrinsic Amplitude: 4 mV
Lead Channel Sensing Intrinsic Amplitude: 4 mV
Lead Channel Setting Pacing Amplitude: 2 V
Lead Channel Setting Pacing Amplitude: 3.25 V
Lead Channel Setting Pacing Pulse Width: 0.4 ms
Lead Channel Setting Sensing Sensitivity: 1.2 mV
Zone Setting Status: 755011
Zone Setting Status: 755011

## 2024-07-15 ENCOUNTER — Ambulatory Visit: Payer: Self-pay | Admitting: Internal Medicine

## 2024-07-16 NOTE — Progress Notes (Signed)
 Remote PPM Transmission

## 2024-10-12 ENCOUNTER — Ambulatory Visit

## 2024-10-12 DIAGNOSIS — I442 Atrioventricular block, complete: Secondary | ICD-10-CM | POA: Diagnosis not present

## 2024-10-14 ENCOUNTER — Telehealth: Payer: Self-pay | Admitting: *Deleted

## 2024-10-14 LAB — CUP PACEART REMOTE DEVICE CHECK
Battery Remaining Longevity: 12 mo
Battery Voltage: 2.82 V
Brady Statistic AP VP Percent: 20.62 %
Brady Statistic AP VS Percent: 0 %
Brady Statistic AS VP Percent: 79.36 %
Brady Statistic AS VS Percent: 0.02 %
Brady Statistic RA Percent Paced: 20.61 %
Brady Statistic RV Percent Paced: 99.98 %
Date Time Interrogation Session: 20260120001940
Implantable Lead Connection Status: 753985
Implantable Lead Connection Status: 753985
Implantable Lead Implant Date: 20191024
Implantable Lead Implant Date: 20191024
Implantable Lead Location: 753859
Implantable Lead Location: 753860
Implantable Lead Model: 3830
Implantable Lead Model: 5076
Implantable Pulse Generator Implant Date: 20191024
Lead Channel Impedance Value: 304 Ohm
Lead Channel Impedance Value: 304 Ohm
Lead Channel Impedance Value: 418 Ohm
Lead Channel Impedance Value: 418 Ohm
Lead Channel Pacing Threshold Amplitude: 0.625 V
Lead Channel Pacing Threshold Amplitude: 1.5 V
Lead Channel Pacing Threshold Pulse Width: 0.4 ms
Lead Channel Pacing Threshold Pulse Width: 0.4 ms
Lead Channel Sensing Intrinsic Amplitude: 1.75 mV
Lead Channel Sensing Intrinsic Amplitude: 1.75 mV
Lead Channel Sensing Intrinsic Amplitude: 4 mV
Lead Channel Sensing Intrinsic Amplitude: 4 mV
Lead Channel Setting Pacing Amplitude: 2 V
Lead Channel Setting Pacing Amplitude: 3.25 V
Lead Channel Setting Pacing Pulse Width: 0.4 ms
Lead Channel Setting Sensing Sensitivity: 1.2 mV
Zone Setting Status: 755011
Zone Setting Status: 755011

## 2024-10-14 NOTE — Telephone Encounter (Signed)
 Alert received from [employer]:  Pacemaker: Scheduled remote reviewed. Normal device function.  Presenting rhythm: AS/VP Known PAF, AF burden 0.1%, longest 42 min 14 sec, no OAC on med list, cardiology note from 06/25/2024 refers to starting OAC but does not appear to have happened.  Sent to triage 2 NSVT, 6 & 13 beats, V>A, V-rate 186 & 200 bpm Next remote transmission per protocol. ML, CVRS ____________________________________________________________________________      Routing to EP triage for medication management and labs

## 2024-10-14 NOTE — Telephone Encounter (Signed)
 Forwarding to provider taking over care, for approval and what OAC/dose to start

## 2024-10-15 NOTE — Progress Notes (Signed)
 Remote PPM Transmission

## 2024-10-19 ENCOUNTER — Ambulatory Visit: Payer: Self-pay | Admitting: Cardiovascular Disease

## 2024-10-20 MED ORDER — APIXABAN 2.5 MG PO TABS: 2.5000 mg | ORAL_TABLET | Freq: Two times a day (BID) | ORAL | 3 refills | Status: AC

## 2024-10-20 NOTE — Telephone Encounter (Signed)
 Spoke with the patient's son and advised on recommendations from Dr. Nancey. He verbalized understanding. Prescription has been sent in.

## 2025-01-11 ENCOUNTER — Ambulatory Visit

## 2025-02-15 ENCOUNTER — Ambulatory Visit: Admitting: Neurology

## 2025-04-12 ENCOUNTER — Ambulatory Visit

## 2025-07-12 ENCOUNTER — Ambulatory Visit

## 2025-10-11 ENCOUNTER — Ambulatory Visit
